# Patient Record
Sex: Female | Born: 1935
Health system: Southern US, Community
[De-identification: ages and names within clinical notes are randomized; demographics above are authoritative.]

## PROBLEM LIST (undated history)

## (undated) DIAGNOSIS — R238 Other skin changes: Secondary | ICD-10-CM

## (undated) DIAGNOSIS — I10 Essential (primary) hypertension: Secondary | ICD-10-CM

## (undated) DIAGNOSIS — F419 Anxiety disorder, unspecified: Secondary | ICD-10-CM

## (undated) DIAGNOSIS — N3281 Overactive bladder: Secondary | ICD-10-CM

## (undated) DIAGNOSIS — R233 Spontaneous ecchymoses: Secondary | ICD-10-CM

## (undated) DIAGNOSIS — M7072 Other bursitis of hip, left hip: Secondary | ICD-10-CM

## (undated) DIAGNOSIS — R42 Dizziness and giddiness: Secondary | ICD-10-CM

## (undated) DIAGNOSIS — R7303 Prediabetes: Secondary | ICD-10-CM

## (undated) DIAGNOSIS — I2510 Atherosclerotic heart disease of native coronary artery without angina pectoris: Secondary | ICD-10-CM

## (undated) DIAGNOSIS — Z85828 Personal history of other malignant neoplasm of skin: Secondary | ICD-10-CM

## (undated) DIAGNOSIS — M1990 Unspecified osteoarthritis, unspecified site: Secondary | ICD-10-CM

## (undated) DIAGNOSIS — Z87442 Personal history of urinary calculi: Secondary | ICD-10-CM

## (undated) DIAGNOSIS — IMO0001 Reserved for inherently not codable concepts without codable children: Secondary | ICD-10-CM

## (undated) DIAGNOSIS — R351 Nocturia: Secondary | ICD-10-CM

## (undated) DIAGNOSIS — E785 Hyperlipidemia, unspecified: Secondary | ICD-10-CM

## (undated) DIAGNOSIS — I422 Other hypertrophic cardiomyopathy: Secondary | ICD-10-CM

## (undated) HISTORY — DX: Hyperlipidemia, unspecified: E78.5

## (undated) HISTORY — PX: HEMORRHOID SURGERY: SHX153

## (undated) HISTORY — DX: Unspecified osteoarthritis, unspecified site: M19.90

## (undated) HISTORY — PX: ABDOMINAL HYSTERECTOMY: SHX81

## (undated) HISTORY — PX: EYE SURGERY: SHX253

---

## 2001-07-25 ENCOUNTER — Encounter: Payer: Self-pay | Admitting: Preventative Medicine

## 2001-07-25 ENCOUNTER — Ambulatory Visit (HOSPITAL_COMMUNITY): Admission: RE | Admit: 2001-07-25 | Discharge: 2001-07-25 | Payer: Self-pay | Admitting: Preventative Medicine

## 2001-12-23 ENCOUNTER — Observation Stay (HOSPITAL_COMMUNITY): Admission: RE | Admit: 2001-12-23 | Discharge: 2001-12-24 | Payer: Self-pay | Admitting: Urology

## 2002-04-27 ENCOUNTER — Ambulatory Visit (HOSPITAL_COMMUNITY): Admission: RE | Admit: 2002-04-27 | Discharge: 2002-04-27 | Payer: Self-pay | Admitting: Pulmonary Disease

## 2002-09-02 ENCOUNTER — Encounter: Admission: RE | Admit: 2002-09-02 | Discharge: 2002-09-02 | Payer: Self-pay | Admitting: Family Medicine

## 2002-11-17 ENCOUNTER — Encounter: Admission: RE | Admit: 2002-11-17 | Discharge: 2002-11-17 | Payer: Self-pay | Admitting: Family Medicine

## 2002-12-15 ENCOUNTER — Encounter: Admission: RE | Admit: 2002-12-15 | Discharge: 2002-12-15 | Payer: Self-pay | Admitting: Sports Medicine

## 2003-01-28 ENCOUNTER — Encounter: Admission: RE | Admit: 2003-01-28 | Discharge: 2003-01-28 | Payer: Self-pay | Admitting: Sports Medicine

## 2003-06-08 ENCOUNTER — Encounter: Admission: RE | Admit: 2003-06-08 | Discharge: 2003-06-08 | Payer: Self-pay | Admitting: Sports Medicine

## 2003-07-22 ENCOUNTER — Encounter: Admission: RE | Admit: 2003-07-22 | Discharge: 2003-07-22 | Payer: Self-pay | Admitting: Family Medicine

## 2003-08-05 ENCOUNTER — Encounter: Admission: RE | Admit: 2003-08-05 | Discharge: 2003-08-05 | Payer: Self-pay | Admitting: Sports Medicine

## 2003-08-31 ENCOUNTER — Encounter: Admission: RE | Admit: 2003-08-31 | Discharge: 2003-08-31 | Payer: Self-pay | Admitting: Sports Medicine

## 2003-10-12 ENCOUNTER — Observation Stay (HOSPITAL_COMMUNITY): Admission: EM | Admit: 2003-10-12 | Discharge: 2003-10-13 | Payer: Self-pay | Admitting: Emergency Medicine

## 2003-10-14 ENCOUNTER — Encounter: Admission: RE | Admit: 2003-10-14 | Discharge: 2003-10-14 | Payer: Self-pay | Admitting: Family Medicine

## 2003-11-05 ENCOUNTER — Ambulatory Visit: Payer: Self-pay | Admitting: Family Medicine

## 2003-11-19 ENCOUNTER — Ambulatory Visit: Payer: Self-pay | Admitting: Family Medicine

## 2003-12-07 ENCOUNTER — Ambulatory Visit: Payer: Self-pay | Admitting: Sports Medicine

## 2004-03-23 ENCOUNTER — Ambulatory Visit: Payer: Self-pay | Admitting: Sports Medicine

## 2004-03-23 ENCOUNTER — Ambulatory Visit (HOSPITAL_COMMUNITY): Admission: RE | Admit: 2004-03-23 | Discharge: 2004-03-23 | Payer: Self-pay | Admitting: Sports Medicine

## 2004-07-08 ENCOUNTER — Emergency Department (HOSPITAL_COMMUNITY): Admission: EM | Admit: 2004-07-08 | Discharge: 2004-07-08 | Payer: Self-pay | Admitting: Emergency Medicine

## 2004-07-10 ENCOUNTER — Ambulatory Visit: Payer: Self-pay | Admitting: Family Medicine

## 2004-07-17 ENCOUNTER — Ambulatory Visit: Payer: Self-pay | Admitting: Family Medicine

## 2004-08-14 ENCOUNTER — Ambulatory Visit: Payer: Self-pay | Admitting: Family Medicine

## 2004-09-15 ENCOUNTER — Ambulatory Visit: Payer: Self-pay | Admitting: Family Medicine

## 2005-01-16 ENCOUNTER — Ambulatory Visit: Payer: Self-pay | Admitting: Family Medicine

## 2005-01-26 ENCOUNTER — Ambulatory Visit: Payer: Self-pay | Admitting: Family Medicine

## 2005-02-27 ENCOUNTER — Ambulatory Visit: Payer: Self-pay | Admitting: Sports Medicine

## 2005-08-16 ENCOUNTER — Encounter (INDEPENDENT_AMBULATORY_CARE_PROVIDER_SITE_OTHER): Payer: Self-pay | Admitting: Internal Medicine

## 2005-08-17 ENCOUNTER — Ambulatory Visit: Payer: Self-pay | Admitting: Internal Medicine

## 2005-08-20 LAB — CONVERTED CEMR LAB
ALT: 20 units/L
AST: 20 units/L
Albumin: 4.4 g/dL
Alkaline Phosphatase: 61 units/L
BUN: 17 mg/dL
CO2: 30 meq/L
Calcium: 9.2 mg/dL
Creatinine, Ser: 1 mg/dL
Glucose, Bld: 94 mg/dL
LDL Cholesterol: 207 mg/dL
Potassium: 4.3 meq/L
Total CHOL/HDL Ratio: 6.5
Total Protein: 7.6 g/dL
Triglycerides: 222 mg/dL

## 2005-08-27 ENCOUNTER — Ambulatory Visit (HOSPITAL_COMMUNITY): Admission: RE | Admit: 2005-08-27 | Discharge: 2005-08-27 | Payer: Self-pay | Admitting: Internal Medicine

## 2005-08-30 ENCOUNTER — Encounter (INDEPENDENT_AMBULATORY_CARE_PROVIDER_SITE_OTHER): Payer: Self-pay | Admitting: Internal Medicine

## 2005-10-03 ENCOUNTER — Ambulatory Visit (HOSPITAL_COMMUNITY): Admission: RE | Admit: 2005-10-03 | Discharge: 2005-10-03 | Payer: Self-pay | Admitting: Internal Medicine

## 2005-11-12 ENCOUNTER — Encounter (INDEPENDENT_AMBULATORY_CARE_PROVIDER_SITE_OTHER): Payer: Self-pay | Admitting: Internal Medicine

## 2005-11-12 LAB — CONVERTED CEMR LAB
Hemoglobin, Urine: NEGATIVE
Specific Gravity, Urine: 1.025
Urine Glucose: NEGATIVE mg/dL
pH: 5

## 2005-11-20 ENCOUNTER — Ambulatory Visit (HOSPITAL_COMMUNITY): Admission: RE | Admit: 2005-11-20 | Discharge: 2005-11-20 | Payer: Self-pay | Admitting: Internal Medicine

## 2005-11-20 ENCOUNTER — Encounter (INDEPENDENT_AMBULATORY_CARE_PROVIDER_SITE_OTHER): Payer: Self-pay | Admitting: Internal Medicine

## 2005-11-20 ENCOUNTER — Ambulatory Visit: Payer: Self-pay | Admitting: Cardiology

## 2005-12-25 ENCOUNTER — Ambulatory Visit: Payer: Self-pay | Admitting: Family Medicine

## 2005-12-28 ENCOUNTER — Ambulatory Visit: Payer: Self-pay | Admitting: Internal Medicine

## 2006-02-22 ENCOUNTER — Ambulatory Visit: Payer: Self-pay | Admitting: Internal Medicine

## 2006-02-22 ENCOUNTER — Ambulatory Visit (HOSPITAL_COMMUNITY): Admission: RE | Admit: 2006-02-22 | Discharge: 2006-02-22 | Payer: Self-pay | Admitting: Internal Medicine

## 2006-02-25 ENCOUNTER — Encounter (INDEPENDENT_AMBULATORY_CARE_PROVIDER_SITE_OTHER): Payer: Self-pay | Admitting: Internal Medicine

## 2006-04-16 ENCOUNTER — Ambulatory Visit: Payer: Self-pay | Admitting: Internal Medicine

## 2006-04-16 DIAGNOSIS — J309 Allergic rhinitis, unspecified: Secondary | ICD-10-CM | POA: Insufficient documentation

## 2006-04-16 DIAGNOSIS — H3530 Unspecified macular degeneration: Secondary | ICD-10-CM | POA: Insufficient documentation

## 2006-04-16 DIAGNOSIS — R32 Unspecified urinary incontinence: Secondary | ICD-10-CM | POA: Insufficient documentation

## 2006-04-16 DIAGNOSIS — Z85828 Personal history of other malignant neoplasm of skin: Secondary | ICD-10-CM | POA: Insufficient documentation

## 2006-04-16 DIAGNOSIS — D234 Other benign neoplasm of skin of scalp and neck: Secondary | ICD-10-CM | POA: Insufficient documentation

## 2006-04-16 DIAGNOSIS — D259 Leiomyoma of uterus, unspecified: Secondary | ICD-10-CM | POA: Insufficient documentation

## 2006-04-16 DIAGNOSIS — I10 Essential (primary) hypertension: Secondary | ICD-10-CM | POA: Insufficient documentation

## 2006-04-16 DIAGNOSIS — Z87442 Personal history of urinary calculi: Secondary | ICD-10-CM | POA: Insufficient documentation

## 2006-04-16 DIAGNOSIS — E782 Mixed hyperlipidemia: Secondary | ICD-10-CM | POA: Insufficient documentation

## 2006-04-16 DIAGNOSIS — N318 Other neuromuscular dysfunction of bladder: Secondary | ICD-10-CM | POA: Insufficient documentation

## 2006-04-16 DIAGNOSIS — Z8669 Personal history of other diseases of the nervous system and sense organs: Secondary | ICD-10-CM | POA: Insufficient documentation

## 2006-09-24 ENCOUNTER — Ambulatory Visit (HOSPITAL_COMMUNITY): Admission: RE | Admit: 2006-09-24 | Discharge: 2006-09-24 | Payer: Self-pay | Admitting: Internal Medicine

## 2006-09-24 ENCOUNTER — Ambulatory Visit: Payer: Self-pay | Admitting: Internal Medicine

## 2006-09-24 ENCOUNTER — Telehealth (INDEPENDENT_AMBULATORY_CARE_PROVIDER_SITE_OTHER): Payer: Self-pay | Admitting: *Deleted

## 2006-09-24 DIAGNOSIS — M542 Cervicalgia: Secondary | ICD-10-CM | POA: Insufficient documentation

## 2006-09-24 DIAGNOSIS — M549 Dorsalgia, unspecified: Secondary | ICD-10-CM | POA: Insufficient documentation

## 2006-09-24 DIAGNOSIS — L570 Actinic keratosis: Secondary | ICD-10-CM | POA: Insufficient documentation

## 2006-09-24 DIAGNOSIS — I868 Varicose veins of other specified sites: Secondary | ICD-10-CM | POA: Insufficient documentation

## 2006-09-25 ENCOUNTER — Telehealth (INDEPENDENT_AMBULATORY_CARE_PROVIDER_SITE_OTHER): Payer: Self-pay | Admitting: *Deleted

## 2006-09-26 ENCOUNTER — Encounter: Payer: Self-pay | Admitting: Internal Medicine

## 2006-09-26 ENCOUNTER — Ambulatory Visit: Payer: Self-pay | Admitting: Internal Medicine

## 2006-09-30 ENCOUNTER — Encounter (INDEPENDENT_AMBULATORY_CARE_PROVIDER_SITE_OTHER): Payer: Self-pay | Admitting: Internal Medicine

## 2006-10-01 ENCOUNTER — Ambulatory Visit (HOSPITAL_COMMUNITY): Admission: RE | Admit: 2006-10-01 | Discharge: 2006-10-01 | Payer: Self-pay | Admitting: Internal Medicine

## 2006-10-02 ENCOUNTER — Ambulatory Visit: Payer: Self-pay | Admitting: Internal Medicine

## 2006-10-02 DIAGNOSIS — D049 Carcinoma in situ of skin, unspecified: Secondary | ICD-10-CM | POA: Insufficient documentation

## 2006-10-03 ENCOUNTER — Encounter (INDEPENDENT_AMBULATORY_CARE_PROVIDER_SITE_OTHER): Payer: Self-pay | Admitting: Internal Medicine

## 2006-10-11 ENCOUNTER — Ambulatory Visit: Payer: Self-pay | Admitting: Internal Medicine

## 2006-10-18 ENCOUNTER — Telehealth (INDEPENDENT_AMBULATORY_CARE_PROVIDER_SITE_OTHER): Payer: Self-pay | Admitting: *Deleted

## 2006-12-26 ENCOUNTER — Ambulatory Visit: Payer: Self-pay | Admitting: Internal Medicine

## 2007-02-07 ENCOUNTER — Ambulatory Visit: Payer: Self-pay | Admitting: Internal Medicine

## 2007-03-17 ENCOUNTER — Ambulatory Visit: Payer: Self-pay | Admitting: Internal Medicine

## 2007-09-11 ENCOUNTER — Encounter (INDEPENDENT_AMBULATORY_CARE_PROVIDER_SITE_OTHER): Payer: Self-pay | Admitting: Internal Medicine

## 2007-11-06 ENCOUNTER — Ambulatory Visit (HOSPITAL_COMMUNITY): Admission: RE | Admit: 2007-11-06 | Discharge: 2007-11-06 | Payer: Self-pay | Admitting: Internal Medicine

## 2007-11-17 ENCOUNTER — Telehealth (INDEPENDENT_AMBULATORY_CARE_PROVIDER_SITE_OTHER): Payer: Self-pay | Admitting: *Deleted

## 2008-01-19 ENCOUNTER — Ambulatory Visit: Payer: Self-pay | Admitting: Internal Medicine

## 2008-01-19 DIAGNOSIS — M25559 Pain in unspecified hip: Secondary | ICD-10-CM | POA: Insufficient documentation

## 2008-05-25 ENCOUNTER — Telehealth (INDEPENDENT_AMBULATORY_CARE_PROVIDER_SITE_OTHER): Payer: Self-pay | Admitting: Internal Medicine

## 2008-09-01 ENCOUNTER — Ambulatory Visit: Payer: Self-pay | Admitting: Internal Medicine

## 2008-09-01 DIAGNOSIS — M810 Age-related osteoporosis without current pathological fracture: Secondary | ICD-10-CM | POA: Insufficient documentation

## 2008-09-02 ENCOUNTER — Encounter (INDEPENDENT_AMBULATORY_CARE_PROVIDER_SITE_OTHER): Payer: Self-pay | Admitting: Internal Medicine

## 2008-09-03 LAB — CONVERTED CEMR LAB
ALT: 16 units/L (ref 0–35)
AST: 17 units/L (ref 0–37)
Alkaline Phosphatase: 61 units/L (ref 39–117)
CO2: 22 meq/L (ref 19–32)
Cholesterol: 267 mg/dL — ABNORMAL HIGH (ref 0–200)
Glucose, Bld: 104 mg/dL — ABNORMAL HIGH (ref 70–99)
Sodium: 139 meq/L (ref 135–145)
Total CHOL/HDL Ratio: 5.2
Total Protein: 7.8 g/dL (ref 6.0–8.3)
Triglycerides: 193 mg/dL — ABNORMAL HIGH (ref <150)
VLDL: 39 mg/dL (ref 0–40)

## 2008-09-06 ENCOUNTER — Encounter (INDEPENDENT_AMBULATORY_CARE_PROVIDER_SITE_OTHER): Payer: Self-pay | Admitting: Internal Medicine

## 2008-09-06 ENCOUNTER — Ambulatory Visit (HOSPITAL_COMMUNITY): Admission: RE | Admit: 2008-09-06 | Discharge: 2008-09-06 | Payer: Self-pay | Admitting: Internal Medicine

## 2008-09-13 ENCOUNTER — Encounter (INDEPENDENT_AMBULATORY_CARE_PROVIDER_SITE_OTHER): Payer: Self-pay | Admitting: Internal Medicine

## 2008-09-17 ENCOUNTER — Ambulatory Visit: Payer: Self-pay | Admitting: Internal Medicine

## 2008-10-18 ENCOUNTER — Ambulatory Visit: Payer: Self-pay | Admitting: Internal Medicine

## 2008-10-18 DIAGNOSIS — L708 Other acne: Secondary | ICD-10-CM | POA: Insufficient documentation

## 2008-10-18 HISTORY — DX: Other acne: L70.8

## 2008-10-28 ENCOUNTER — Encounter (INDEPENDENT_AMBULATORY_CARE_PROVIDER_SITE_OTHER): Payer: Self-pay | Admitting: Internal Medicine

## 2008-11-26 LAB — HM MAMMOGRAPHY

## 2008-11-30 ENCOUNTER — Ambulatory Visit (HOSPITAL_COMMUNITY): Admission: RE | Admit: 2008-11-30 | Discharge: 2008-11-30 | Payer: Self-pay | Admitting: Family Medicine

## 2009-09-27 ENCOUNTER — Emergency Department (HOSPITAL_COMMUNITY): Admission: EM | Admit: 2009-09-27 | Discharge: 2009-09-27 | Payer: Self-pay | Admitting: Emergency Medicine

## 2009-10-24 ENCOUNTER — Encounter: Admission: RE | Admit: 2009-10-24 | Discharge: 2009-10-24 | Payer: Self-pay | Admitting: Diagnostic Neuroimaging

## 2009-11-17 ENCOUNTER — Ambulatory Visit (HOSPITAL_COMMUNITY): Admission: RE | Admit: 2009-11-17 | Discharge: 2009-11-17 | Payer: Self-pay | Admitting: Ophthalmology

## 2009-12-01 ENCOUNTER — Ambulatory Visit (HOSPITAL_COMMUNITY): Admission: RE | Admit: 2009-12-01 | Discharge: 2009-12-01 | Payer: Self-pay | Admitting: Ophthalmology

## 2009-12-13 ENCOUNTER — Ambulatory Visit (HOSPITAL_COMMUNITY): Admission: RE | Admit: 2009-12-13 | Discharge: 2009-12-13 | Payer: Self-pay | Admitting: Internal Medicine

## 2010-02-09 ENCOUNTER — Emergency Department (HOSPITAL_COMMUNITY)
Admission: EM | Admit: 2010-02-09 | Discharge: 2010-02-09 | Payer: Self-pay | Source: Home / Self Care | Admitting: Emergency Medicine

## 2010-02-09 ENCOUNTER — Observation Stay (HOSPITAL_COMMUNITY)
Admission: EM | Admit: 2010-02-09 | Discharge: 2010-02-10 | Payer: Self-pay | Source: Home / Self Care | Attending: Cardiovascular Disease | Admitting: Cardiovascular Disease

## 2010-05-08 LAB — DIFFERENTIAL
Basophils Absolute: 0 10*3/uL (ref 0.0–0.1)
Basophils Relative: 0 % (ref 0–1)
Eosinophils Absolute: 0.2 10*3/uL (ref 0.0–0.7)
Eosinophils Relative: 2 % (ref 0–5)
Lymphocytes Relative: 38 % (ref 12–46)
Lymphocytes Relative: 42 % (ref 12–46)
Lymphs Abs: 3 10*3/uL (ref 0.7–4.0)
Monocytes Absolute: 0.5 10*3/uL (ref 0.1–1.0)
Monocytes Relative: 6 % (ref 3–12)
Neutro Abs: 3.7 10*3/uL (ref 1.7–7.7)
Neutro Abs: 4.1 10*3/uL (ref 1.7–7.7)
Neutrophils Relative %: 50 % (ref 43–77)

## 2010-05-08 LAB — COMPREHENSIVE METABOLIC PANEL
ALT: 15 U/L (ref 0–35)
Albumin: 3.2 g/dL — ABNORMAL LOW (ref 3.5–5.2)
Alkaline Phosphatase: 49 U/L (ref 39–117)
CO2: 28 mEq/L (ref 19–32)
Chloride: 105 mEq/L (ref 96–112)
Creatinine, Ser: 0.89 mg/dL (ref 0.4–1.2)
Glucose, Bld: 101 mg/dL — ABNORMAL HIGH (ref 70–99)
Potassium: 4 mEq/L (ref 3.5–5.1)
Total Bilirubin: 0.6 mg/dL (ref 0.3–1.2)

## 2010-05-08 LAB — CBC
Hemoglobin: 14.2 g/dL (ref 12.0–15.0)
MCH: 28.7 pg (ref 26.0–34.0)
MCHC: 32.8 g/dL (ref 30.0–36.0)
Platelets: 215 10*3/uL (ref 150–400)
RBC: 4.92 MIL/uL (ref 3.87–5.11)
WBC: 7.2 10*3/uL (ref 4.0–10.5)

## 2010-05-08 LAB — BASIC METABOLIC PANEL
Calcium: 9.4 mg/dL (ref 8.4–10.5)
GFR calc Af Amer: >60 mL/min (ref >60)
GFR calc non Af Amer: >60 mL/min (ref >60)
Sodium: 138 mEq/L (ref 135–145)

## 2010-05-08 LAB — LIPID PANEL
Cholesterol: 249 mg/dL — ABNORMAL HIGH (ref 0–200)
HDL: 36 mg/dL — ABNORMAL LOW (ref >39)
Total CHOL/HDL Ratio: 6.9 RATIO
Triglycerides: 196 mg/dL — ABNORMAL HIGH (ref <150)

## 2010-05-08 LAB — POCT CARDIAC MARKERS
CKMB, poc: 1.8 ng/mL (ref 1.0–8.0)
Myoglobin, poc: 71.3 ng/mL (ref 12–200)

## 2010-05-08 LAB — HEMOGLOBIN A1C: Hgb A1c MFr Bld: 6.3 % — ABNORMAL HIGH (ref <5.7)

## 2010-05-08 LAB — MRSA PCR SCREENING: MRSA by PCR: NEGATIVE

## 2010-05-11 LAB — BASIC METABOLIC PANEL
CO2: 32 mEq/L (ref 19–32)
GFR calc non Af Amer: >60 mL/min (ref >60)
Glucose, Bld: 110 mg/dL — ABNORMAL HIGH (ref 70–99)
Potassium: 4.3 mEq/L (ref 3.5–5.1)
Sodium: 138 mEq/L (ref 135–145)

## 2010-05-12 LAB — CBC
Platelets: 233 10*3/uL (ref 150–400)
RBC: 4.63 MIL/uL (ref 3.87–5.11)
WBC: 7.3 10*3/uL (ref 4.0–10.5)

## 2010-05-12 LAB — URINALYSIS, ROUTINE W REFLEX MICROSCOPIC
Glucose, UA: NEGATIVE mg/dL
Protein, ur: NEGATIVE mg/dL

## 2010-05-12 LAB — BASIC METABOLIC PANEL
Calcium: 9.7 mg/dL (ref 8.4–10.5)
Chloride: 100 mEq/L (ref 96–112)
Creatinine, Ser: 0.99 mg/dL (ref 0.4–1.2)
GFR calc Af Amer: >60 mL/min (ref >60)
GFR calc non Af Amer: 55 mL/min — ABNORMAL LOW (ref >60)

## 2010-05-12 LAB — DIFFERENTIAL
Lymphocytes Relative: 22 % (ref 12–46)
Lymphs Abs: 1.6 10*3/uL (ref 0.7–4.0)
Neutro Abs: 5.3 10*3/uL (ref 1.7–7.7)
Neutrophils Relative %: 72 % (ref 43–77)

## 2010-05-12 LAB — POCT CARDIAC MARKERS
Myoglobin, poc: 82.5 ng/mL (ref 12–200)
Troponin i, poc: <0.05 ng/mL (ref 0.00–0.09)

## 2010-07-14 NOTE — Procedures (Signed)
NAMEYVONDA, FOUTY                ACCOUNT NO.:  [id number]   MEDICAL RECORD NO.:  [id number]          PATIENT TYPE:  OUT   LOCATION:  RAD                           FACILITY:  [facility]   PHYSICIAN:  Gerrit Friends. Dietrich Pates, MD, FACCDATE OF BIRTH:  01/26/1936   DATE OF PROCEDURE:  11/20/2005  DATE OF DISCHARGE:                                  ECHOCARDIOGRAM   CLINICAL DATA:  A 75 year old woman with hypertension and an abnormal EKG.   Aorta 3.4, left atrium 3.5, septum 1.4, posterior wall 1.1, LV diastole 4.1,  LV systole 2.6.   1. Technically adequate echocardiographic study.  2. Mild left atrial enlargement; normal right atrium.  3. Normal right ventricular size and function; moderate hypertrophy.  4. Moderate sclerosis of the aortic valve; normal internal dimension of      the aortic root; mild calcification of the wall.  5. Normal mitral valve; mild annular calcification; systolic anterior      motion of the chordae.  6. Normal tricuspid valve.  7. Pulmonic artery and pulmonic valve not ideally imaged, but appear      grossly normal.  8. Normal left ventricular size; mild hypertrophy; normal regional and      global function.  9. Normal IVC.      Gerrit Friends. Dietrich Pates, MD, [facility]  Electronically Signed     RMR/MEDQ  D:  11/20/2005  T:  11/22/2005  Job:  [id number]

## 2010-07-14 NOTE — Discharge Summary (Signed)
NAME:  Lauren House, Lauren House                          ACCOUNT NO.:  [id number]   MEDICAL RECORD NO.:  [id number]                   PATIENT TYPE:  OBV   LOCATION:  6526                                 FACILITY:  [facility]   PHYSICIAN:  Henri Medal, MD             DATE OF BIRTH:  [birth year]   DATE OF ADMISSION:  10/12/2003  DATE OF DISCHARGE:  10/13/2003                                 DISCHARGE SUMMARY   DIAGNOSES:  1. Hypertensive urgency.  2. Hypercholesterolemia.   HISTORY OF PRESENT ILLNESS:  This 75 year old white female came to the ER  complaining of nausea, dizziness, right shoulder tingling sensation,  headaches.  Blood pressure at the ER was read at 200/103.  The patient was  in no acute distress.  Due to the patient's risk factors such as  hyperlipidemia, cardiac catheterization, 25% mid-LAD lesion, and family  history for positive for CAD, the patient was admitted for cardiac  monitoring, treatment of blood pressure, and rule out MI and TIA.   Cardiac enzymes POC x 3 were normal.  EKG displaced changes consistent with  previous EKG such as ST T-wave changes on V1, V2, and aVF.  The patient was  treated for hypertension and received ASS.   PHYSICAL EXAMINATION:  VITAL SIGNS:  On October 13, 2003, blood pressure was  125/70.  Heart rate 70, respiratory rate 20.  O2 saturation 93% on room air.  GENERAL:  She was in no acute distress.  All other physical examination was  unremarkable.   LABORATORY DATA:  Normal results on cardiac enzymes.  Fasting lipid profile  was pending.  The EKG, head CT, and chest x-ray were suggestive for  longstanding hypertension.   PLAN:  Continue HCTZ.  Add 81 mg of ASA, impossible to increase dosage due  to nose bleeds.  Since blood pressure, cardiac enzymes, and EKG this a.m.  are normal, the patient is discharged home.  Refer to cardiology as an  outpatient for further follow-up  _________.  Regarding the hyperlipidemia,  increase LDL.  The  patient is intolerant statins.  Strongly consider Niacin  plus bile acid sequestrant (reported LDL 200).  As the patient goal is to be  at less than 130.  The patient has a follow-up appointment tomorrow with  primary care physician, Dr. Penni Bombard.   Laboratory data at discharge, troponin less than 0.01, CK-MB as above x 2.  Lipid profile pending.   EKG showed T-wave inversion, septal, inferior, and laterally.  CT of the  head showed small vessel disease.  Chest x-ray with borderline cardiomegaly.   PROCEDURE:  1. EKG.  2. CT of the head.  3. Chest x-ray.   DISCHARGE MEDICATIONS:  1. HCTZ 25 mg 1 tablet q.d.  2. Enalapril 500 mg 1 tablet q.d.   FOLLOW UP:  Call on Dr. Penni Bombard at [facility] October 14, 2003 at  1:30 p.m.  Cardiolite (cardiac  test) at [facility] at [address] Third floor on October 20, 2003 at 9:45 a.m.                                                Henri Medal, MD    FIM/MEDQ  D:  10/23/2003  T:  10/25/2003  Job:  [id number]

## 2010-07-14 NOTE — H&P (Signed)
NAME:  Lauren House, Lauren House                          ACCOUNT NO.:  [id number]   MEDICAL RECORD NO.:  [id number]                   PATIENT TYPE:  OBV   LOCATION:  6526                                 FACILITY:  [facility]   PHYSICIAN:  Rebecca L. Jolinda Croak, M.D.          DATE OF BIRTH:  02-12-1936   DATE OF ADMISSION:  10/12/2003  DATE OF DISCHARGE:  10/13/2003                                HISTORY & PHYSICAL   HISTORY OF PRESENT ILLNESS:  A 75 year old white female came to the ER with  a history of nausea, dizziness, right shoulder tingling, and headache.  The  patient states that this a.m. while doing the groceries, she felt nauseated.  It got so intense that she decided to go back home.  At home dizziness and  nausea.  During the p.m. also headache 5 out of 10 in intensity and shoulder  tingling sensation.  During exam, the patient is comfortable in no acute  distress.  She denies chest pain, no SOB, no sweating, no weakness, no  visual changes.  Cardiac cath, five years ago.  The patient states she was  told to have a 25% blockage, but the patient is not sure where is the  blockage or can explain.  Ten years ago, the patient had an episode of  dizziness, fall to the floor, but did not call EMS.  This episode ten years  ago resolved spontaneously.   REVIEW OF SYSTEMS:  The remainder of ROS is noncontributory.   PAST MEDICAL HISTORY:  1. Macular degeneration.  2. Kidney stones with nephrolithiasis.  3. Hyperlipidemia.  4. History of diverticulosis.   PROCEDURES:  1. Pilonidal cystectomy/hemorrhoidectomy three years ago.  2. History of hysterectomy, three years ago.  3. Bladder and separated tack on October 2003 with [facility].   MEDICATIONS:  Calcium and vitamin D combination 600 mg every day.   ALLERGIES:  1. SEPTRA.  2. PENICILLIN.  3. BIAXIN.  4. CIPROFLOXACIN.  5. CEFTIN.  6. LIPITOR (causes myalgias).  7. ZOCOR (causes myalgias).  8. ZETIA.   PROCEDURE:  1. MRI knee/LUCIO on  July 2004.  2. Cardiac cath, 25% MIV LAV lesion, EF 65% on February 2004.   FAMILY HISTORY:  Mother alive at 58 years old for CAD, history of hip  fracture.  Father died at 18 with CAD.  Two brothers, both died age 29 and  23 with lung cancer.  Sister alive at 75, no health problems.   SOCIAL HISTORY:  Retired.  Husband is Cyndra Numbers, a retired Optician, dispensing.  Lives in  [place].  Originally from [place], [place].  Likes to do yard work,  swim, [employer].  Does not smoke, use ETOH, or other drugs.   PHYSICAL EXAMINATION:  VITAL SIGNS:  T 97.9, HR 75, BP 194/103, pulse ox O2  97%, respiratory rate 14.  GENERAL:  No acute distress.  Oriented x 3.  PSYCHIATRIC:  The patient's judgment and insight normal.  Brief  assessment  of mental status normal.  HEENT:  Eyes, conjunctivae and lids normal.  PERRLA.  External ears and  nose, normal.  Teeth and gums normal.  Oropharynx normal.  NECK:  No masses, no tenderness, or thyroid enlargement.  No lymphatic.  RESPIRATORY:  Clear to auscultation bilaterally.  Good respiratory effort.  No wheezing, no rhonchi, no crackles.  CV:  Normal S1, S2.  RRR.  No murmurs.  ABDOMEN:  Left upper quadrant tenderness, intensity 2, on deep palpation.  Positive bowel sounds.  Nondistended.  EXTREMITIES:  Inspection palpation of __________ normal.  No edema.  SKIN:  Normal inspection.  NEUROLOGIC:  Cranial nerves II-XII intact.   LABORATORY DATA:  WBC 8.9, HB 10.5, HCT 42.2, platelets 270, MCV 85.1,  differential N 70%.  BMP, NA 138, K 4.1, CL 104, CO2 27, BUN 16, CR 0.8,  glucose 122.  PT 12.1, INR 0.9, PTT 27.  Cardiac point-of-care MYO 79, 72.6.  CK-MB 1.3, 1.2.  Troponin I less than 0.05, less than 0.05.  Albumin 4.1.  Total protein 7.6.  CA 9.1.   Chest x-ray:  Borderline CM, NAV, bilateral nipple shadows.   Head CT:  No acute abnormalities.   EKG:  Display changes in ST waves on V1, V2, and aVF compared with an old  EKG.   ASSESSMENT:  A 75 year old white  female with hypertensive urgency, nausea,  dizziness, headache, and right shoulder tingling sensation.   PLAN:  1. Hypertension urgency.  BP at ER more than 200/103.  Now BP has been     decreasing without meds up to 170/95.  The patient stated the primary     care physician has been checking her blood pressure since it has been     higher than normal.  HCTZ 25 mg p.o. every day.  Will follow.  2. Hypercholesterolemia.  PCP has tried to treat with Zocor, Lipitor, but     failed for the patient intolerance to medication.  Fasting lipid profile     for a.m.  ASA 81 mg every day.  3. EKG changes.  Previous EKG show ST waves inversion in leads V1, V2, and     aVF.  Actual EKG displayed changes in ST waves on the same V1, V2, and     aVF compare with the past EKG.  Cardiac enzymes point-of-care normal x 3.     Would request cardiac enzymes, troponin I q.8h. x 3.  Repeat EKG in the     a.m.  Chest x-ray shows mild cardiomegaly.  Stress test as an outpatient.  4. Headache, most likely secondary to hypertension.  Head CT normal.  5. Tingling on right shoulder.  Neurologic exam unremarkable.  6. Dizziness, most likely related to hypertension.  7. Nausea, probably related with hypertension or MI, will follow.   DISPOSITION:  Admit for blood pressure treatment and cardiac monitoring to  rule out MI.  HCTZ for hypertension.      Henri Medal, MD                   Randon Goldsmith. Jolinda Croak, M.D.    Lendon Colonel  D:  10/23/2003  T:  10/24/2003  Job:  [id number]

## 2010-07-14 NOTE — Op Note (Signed)
NAME:  Lauren House, Lauren House                          ACCOUNT NO.:  [id number]   MEDICAL RECORD NO.:  [id number]                   PATIENT TYPE:  AMB   LOCATION:  DAY                                  FACILITY:  [facility]   PHYSICIAN:  Jamison Neighbor, M.D.               DATE OF BIRTH:  09-10-1935   DATE OF PROCEDURE:  12/23/2001  DATE OF DISCHARGE:                                 OPERATIVE REPORT   PREOPERATIVE DIAGNOSES:  1. Cystocele.  2. Stress urinary incontinence.   POSTOPERATIVE DIAGNOSES:  1. Cystocele.  2. Stress urinary incontinence.   PROCEDURE:  1. Cystoscopy.  2. Anterior repair.  3. Pubovaginal sling.  4. Suprapubic tube.   SURGEON:  Jamison Neighbor, M.D.   ANESTHESIA:  General.   COMPLICATIONS:  None.   DRAINS:  A Bonanno Cystocath.   BRIEF HISTORY:  This 75 year old female has had problems with bladder  prolapse along with stress urinary incontinence.  The patient does have some  degree of obstructive voiding secondary to the cystocele and has a postvoid  residual of 125 cc.  __________, the patient has external rotation and loss  of urine, and it is felt that she does have a combination of obstruction as  well as stress incontinence.  The patient is undergo anterior repair plus  sling.  She understands the risks and benefits of the procedure and gave  full and informed consent.   DESCRIPTION OF PROCEDURE:  After the successful induction of general  anesthesia, the patient was placed in the dorsal lithotomy position, prepped  with Betadine, and draped in the usual sterile fashion.  Examination showed  that the patient does have cystocele with a prominent central defect.  The  patient has a very modest rectocele which is asymptomatic.  There is no  vault prolapse or enterocele.  The anterior vaginal mucosa is infiltrated  with local anesthetic.  While that was setting up, a small incision was made  directly above the pubic bone, and this was well below the  patient's  previous abdominoplasty incision.  That incision was carried down to the  pubic bone.  Hemostasis was obtained with electrocautery.  The anterior  vaginal mucosa was then incised, beginning at the mid urethra, extending all  the way back to the cardinal ligaments.  Flaps of mucosa were raised  bilaterally, and entry into the space of Retzius was obtained.  The sling  was then constructed from a 4 x 7 cm piece of Tutoplast treated fascia which  shaped into a T shape.  The long ends of the T were oversewn with #1 nylon.  The sling was pulled from the abdominal incision to the vaginal incision  with tonsil clamps.  The sling was tacked down with Vicryl from the mid  urethra all the way down to the cardinal ligaments, covering over the defect  with fascia.  The area was irrigated,  mucosa was trimmed and then closed  with a running suture of 2-0 Vicryl.  Cystoscopy was performed, and the  bladder was carefully inspected.  It was free of any tumor or stones.  The  midline did show some heaping up from the repair.  The ureteral orifices  were otherwise unremarkable and were not obstructed.  There was no evidence  of any entry of the suture material or sling material into the bladder.  The  sling was then tied down with an appropriate degree of tension.  Several  steps were taken to ensure no overcorrection.  When the bladder was full,  there was no loss of urine with a Crede maneuver to mimic voiding, and there  was a prompt, straight flow of urine.  The sling was tied down so that two  fingers could still be placed underneath the tie superiorly.  The cystoscope  could be inserted, and there was 30-45 degrees of play.  There was  coaptation but no angulation of the urethra with the cystoscope for  visualization.  After the slings had been tied down and trimmed, the area  was irrigated and then closed with Vicryl and skin stitches.  The Laurian Brim was placed under direct vision  and was then sutured down with  nylon sutures.  The patient tolerated the procedure well and was taken to  the recovery room in good condition.                                               Jamison Neighbor, M.D.    RJE/MEDQ  D:  12/23/2001  T:  12/23/2001  Job:  [id number]   cc:   Tilda Burrow, M.D.  [address] [place]  [place] [address]  Fax: [phone]

## 2010-09-05 ENCOUNTER — Encounter: Payer: Self-pay | Admitting: Family Medicine

## 2011-08-23 ENCOUNTER — Other Ambulatory Visit: Payer: Self-pay | Admitting: Family Medicine

## 2011-08-23 DIAGNOSIS — R1031 Right lower quadrant pain: Secondary | ICD-10-CM

## 2011-08-24 ENCOUNTER — Ambulatory Visit
Admission: RE | Admit: 2011-08-24 | Discharge: 2011-08-24 | Disposition: A | Discharge status: Home / Self Care | Payer: Medicare Other | Source: Ambulatory Visit | Attending: Family Medicine | Admitting: Family Medicine

## 2011-08-24 DIAGNOSIS — R1031 Right lower quadrant pain: Secondary | ICD-10-CM

## 2011-08-24 MED ORDER — IOHEXOL 300 MG/ML  SOLN
100.0000 mL | Freq: Once | INTRAMUSCULAR | Status: AC | PRN
  Administered 2011-08-24: 100 mL via INTRAVENOUS

## 2011-09-04 ENCOUNTER — Ambulatory Visit (HOSPITAL_COMMUNITY)
Admission: RE | Admit: 2011-09-04 | Discharge: 2011-09-04 | Disposition: A | Discharge status: Home / Self Care | Payer: Medicare Other | Source: Ambulatory Visit | Attending: Family Medicine | Admitting: Family Medicine

## 2011-09-04 ENCOUNTER — Other Ambulatory Visit: Payer: Self-pay | Admitting: Family Medicine

## 2011-09-04 DIAGNOSIS — M8530 Osteitis condensans, unspecified site: Secondary | ICD-10-CM | POA: Insufficient documentation

## 2011-09-04 DIAGNOSIS — M25551 Pain in right hip: Secondary | ICD-10-CM

## 2011-09-04 DIAGNOSIS — M25559 Pain in unspecified hip: Secondary | ICD-10-CM | POA: Insufficient documentation

## 2011-09-11 ENCOUNTER — Encounter (INDEPENDENT_AMBULATORY_CARE_PROVIDER_SITE_OTHER): Payer: Self-pay

## 2011-09-17 ENCOUNTER — Ambulatory Visit (INDEPENDENT_AMBULATORY_CARE_PROVIDER_SITE_OTHER): Payer: Medicare Other | Admitting: General Surgery

## 2011-09-17 ENCOUNTER — Encounter (INDEPENDENT_AMBULATORY_CARE_PROVIDER_SITE_OTHER): Payer: Self-pay | Admitting: General Surgery

## 2011-09-17 VITALS — BP 142/83 | HR 74 | Temp 99.0°F | Ht 67.0 in | Wt 149.0 lb

## 2011-09-17 DIAGNOSIS — K4020 Bilateral inguinal hernia, without obstruction or gangrene, not specified as recurrent: Secondary | ICD-10-CM

## 2011-09-17 NOTE — Patient Instructions (Signed)
 Hernia repair risk:   Risks include but are no limited to bleeding, infection, chronic pain, nerve entrapment, hernia recurrence, mesh complications, hematoma formation, urinary retention, blood clots, injury to the surrounding structures, and anesthesia risk.   Most patients require pain medication for around 1-2 weeks.  You should not do any heavy lifting for for 6 weeks.  Groin hernia repairs also carry risk of injury to the testicles or the ovaries and numbness in the groin.   Hernia, Surgical Repair A hernia occurs when an internal organ pushes out through a weak spot in the belly (abdominal) wall muscles. Hernias commonly occur in the groin and around the navel. Hernias often can be pushed back into place (reduced). Most hernias tend to get worse over time. Problems occur when abdominal contents get stuck in the opening (incarcerated hernia). The blood supply gets cut off (strangulated hernia). This is an emergency and needs surgery. Otherwise, hernia repair can be an elective procedure. This means you can schedule this at your convenience when an emergency is not present. Because complications can occur, if you decide to repair the hernia, it is best to do it soon. When it becomes an emergency procedure, there is increased risk of complications after surgery. CAUSES   Heavy lifting.   Obesity.   Prolonged coughing.   Straining to move your bowels.   Hernias can also occur through a cut (incision) by a surgeonafter an abdominal operation.  HOME CARE INSTRUCTIONS Before the repair:  Bed rest is not required. You may continue your normal activities, but avoid heavy lifting (more than 10 pounds) or straining. Cough gently. If you are a smoker, it is best to stop. Even the best hernia repair can break down with the continual strain of coughing.   Do not wear anything tight over your hernia. Do not try to keep it in with an outside bandage or truss. These can damage abdominal contents  if they are trapped in the hernia sac.   Eat a normal diet. Avoid constipation. Straining over long periods of time to have a bowel movement will increase hernia size. It also can breakdown repairs. If you cannot do this with diet alone, laxatives or stool softeners may be used.  PRIOR TO SURGERY, SEEK IMMEDIATE MEDICAL CARE IF: You have problems (symptoms) of a trapped (incarcerated) hernia. Symptoms include:  An oral temperature above 102 F (38.9 C) develops, or as your caregiver suggests.   Increasing abdominal pain.   Feeling sick to your stomach(nausea) and vomiting.   You stop passing gas or stool.   The hernia is stuck outside the abdomen, looks discolored, feels hard, or is tender.   You have any changes in your bowel habits or in the hernia that is unusual for you.  LET YOUR CAREGIVERS KNOW ABOUT THE FOLLOWING:  Allergies.   Medications taken including herbs, eye drops, over the counter medications, and creams.   Use of steroids (by mouth or creams).   Family or personal history of problems with anesthetics or Novocaine.   Possibility of pregnancy, if this applies.   Personal history of blood clots (thrombophlebitis).   Family or personal history of bleeding or blood problems.   Previous surgery.   Other health problems.  BEFORE THE PROCEDURE You should be present 1 hour prior to your procedure, or as directed by your caregiver.  AFTER THE PROCEDURE After surgery, you will be taken to the recovery area. A nurse will watch and check your progress there. Once   you are awake, stable, and taking fluids well, you will be allowed to go home as long as there are no problems. Once home, an ice pack (wrapped in a light towel) applied to your operative site may help with discomfort. It may also keep the swelling down. Do not lift anything heavier than 10 pounds (4.55 kilograms). Take showers not baths. Do not drive while taking narcotics. Follow instructions as suggested by  your caregiver.  SEEK IMMEDIATE MEDICAL CARE IF: After surgery:  There is redness, swelling, or increasing pain in the wound.   There is pus coming from the wound.   There is drainage from a wound lasting longer than 1 day.   An unexplained oral temperature above 102 F (38.9 C) develops.   You notice a foul smell coming from the wound or dressing.   There is a breaking open of a wound (edged not staying together) after the sutures have been removed.   You notice increasing pain in the shoulders (shoulder strap areas).   You develop dizzy episodes or fainting while standing.   You develop persistent nausea or vomiting.   You develop a rash.   You have difficulty breathing.   You develop any reaction or side effects to medications given.  MAKE SURE YOU:   Understand these instructions.   Will watch your condition.   Will get help right away if you are not doing well or get worse.   

## 2011-09-19 ENCOUNTER — Other Ambulatory Visit (INDEPENDENT_AMBULATORY_CARE_PROVIDER_SITE_OTHER): Payer: Self-pay | Admitting: General Surgery

## 2011-09-19 DIAGNOSIS — K4090 Unilateral inguinal hernia, without obstruction or gangrene, not specified as recurrent: Secondary | ICD-10-CM

## 2011-09-21 ENCOUNTER — Telehealth (INDEPENDENT_AMBULATORY_CARE_PROVIDER_SITE_OTHER): Payer: Self-pay

## 2011-09-21 DIAGNOSIS — K4020 Bilateral inguinal hernia, without obstruction or gangrene, not specified as recurrent: Secondary | ICD-10-CM | POA: Insufficient documentation

## 2011-09-21 NOTE — Telephone Encounter (Signed)
LM w/ pt's husband that I would call her as soon as I heard from Dr. Blanchie Dessert nurse re: cardiac clearance.

## 2011-09-21 NOTE — Progress Notes (Signed)
Chief Complaint  Patient presents with  . Pre-op Exam    eval Rt hepatic lesion    HISTORY: Pt is 76 year old female with new onset right groin pain.  She tripped, catching herself, around 3 weeks ago.  Since that time, she has had significant right groin pain.  She has had her hip evaluated, and has no evidence for fracture.  She has not personally noted groin bulge.  She has no known history of hernia.  She has no nausea or vomiting.  She has had some constipation.  She had CT scan that demonstrated bilateral fat containing inguinal hernias.  She also had liver lesion seen on CT that appeared to be hemangioma.    Past Medical History  Diagnosis Date  . Hypertension   . Osteoporosis   . Arthritis   . Hyperlipidemia   . Diastolic heart failure secondary to coronary artery disease     Past Surgical History  Procedure Date  . Abdominal hysterectomy   . Hemorrhoid surgery   . Hernia repair     Current Outpatient Prescriptions  Medication Sig Dispense Refill  . aspirin 81 MG tablet Take 81 mg by mouth daily.      . hydrochlorothiazide (,MICROZIDE/HYDRODIURIL,) 12.5 MG capsule Take 12.5 mg by mouth daily.        . ranolazine (RANEXA) 500 MG 12 hr tablet Take 500 mg by mouth daily.         Allergies  Allergen Reactions  . Aspirin     REACTION: nose bleeds  . Atorvastatin     REACTION: muscle pain  . Ciprofloxacin     REACTION: nausea  . Clarithromycin   . Colestipol Hcl     REACTION: hallucinations  . Ezetimibe     REACTION: GI upset  . Penicillins     REACTION: rash  . Simvastatin     REACTION: muscle spasms     Family History  Problem Relation Age of Onset  . Heart disease Father   . Cancer Sister     lung  . Cancer Brother     lung     History   Social History  . Marital Status: Married    Spouse Name: N/A    Number of Children: N/A  . Years of Education: N/A   Social History Main Topics  . Smoking status: Never Smoker   . Smokeless tobacco: None    . Alcohol Use: No  . Drug Use: No  . Sexually Active:     REVIEW OF SYSTEMS - PERTINENT POSITIVES ONLY: 12 point review of systems negative other than HPI and PMH except for constipation.    EXAM: Filed Vitals:   09/17/11 0852  BP: 142/83  Pulse: 74  Temp: 99 F (37.2 C)    Gen:  No acute distress.  Well nourished and well groomed.   Neurological: Alert and oriented to person, place, and time. Coordination normal.  Head: Normocephalic and atraumatic.  Eyes: Conjunctivae are normal. Pupils are equal, round, and reactive to light. No scleral icterus.  Neck: Normal range of motion. Neck supple. No tracheal deviation or thyromegaly present.  Cardiovascular: Normal rate, regular rhythm, normal heart sounds and intact distal pulses.  Exam reveals no gallop and no friction rub.  No murmur heard. Respiratory: Effort normal.  No respiratory distress. No chest wall tenderness. Breath sounds normal.  No wheezes, rales or rhonchi.  GI: Soft. Bowel sounds are normal. The abdomen is soft and nontender.  There is   no rebound and no guarding.  Musculoskeletal: Normal range of motion. Extremities are nontender.  Lymphadenopathy: No cervical, preauricular, postauricular or axillary adenopathy is present Skin: Skin is warm and dry. No rash noted. No diaphoresis. No erythema. No pallor. No clubbing, cyanosis, or edema.   Psychiatric: Normal mood and affect. Behavior is normal. Judgment and thought content normal.    LABORATORY RESULTS: Available labs are reviewed    RADIOLOGY RESULTS: See E-Chart or I-Site for most recent results.  Images and reports are reviewed.    ASSESSMENT AND PLAN: Bilateral inguinal hernia, right symptomatic Pt with fat containing inguinal hernias.   It is not clear that all of her symptoms are related to the hernia, but she is clearly tender on exam in the right groin over the site of the hernia.   She has had a hysterectomy via midline incision.  This may prevent  us from doing laparoscopic repair.  If so, I would do open repair on the right only.  I discussed the risks of bleeding, infection, damage to adjacent structures, recurrent hernia, risk of pain, risk of open operation.    She wishes to proceed.  We need cardiac clearance. She just saw cardiology 3 weeks ago.       30 minutes were spent with visit.    Redith Drach L Alantra Popoca MD Surgical Oncology, General and Endocrine Surgery [facility]      Visit Diagnoses: 1. Bilateral inguinal hernia, right symptomatic     Primary Care Physician: PICKARD,WARREN TOM, MD    

## 2011-09-21 NOTE — Telephone Encounter (Signed)
LM w/ triage to call regarding the pt's cardiac clearance.

## 2011-09-21 NOTE — Assessment & Plan Note (Signed)
Pt with fat containing inguinal hernias.   It is not clear that all of her symptoms are related to the hernia, but she is clearly tender on exam in the right groin over the site of the hernia.   She has had a hysterectomy via midline incision.  This may prevent [place] from doing laparoscopic repair.  If so, I would do open repair on the right only.  I discussed the risks of bleeding, infection, damage to adjacent structures, recurrent hernia, risk of pain, risk of open operation.    She wishes to proceed.  We need cardiac clearance. She just saw cardiology 3 weeks ago.

## 2011-09-21 NOTE — Addendum Note (Signed)
Addended by: Almond Lint on: 09/21/2011 09:20 PM   Modules accepted: Orders

## 2011-09-25 ENCOUNTER — Encounter (HOSPITAL_COMMUNITY): Payer: Self-pay | Admitting: Pharmacy Technician

## 2011-09-25 ENCOUNTER — Telehealth (INDEPENDENT_AMBULATORY_CARE_PROVIDER_SITE_OTHER): Payer: Self-pay

## 2011-09-25 NOTE — Telephone Encounter (Signed)
Contacted pt that CC is still pending with [facility].

## 2011-10-08 ENCOUNTER — Telehealth (INDEPENDENT_AMBULATORY_CARE_PROVIDER_SITE_OTHER): Payer: Self-pay | Admitting: General Surgery

## 2011-10-08 ENCOUNTER — Encounter (HOSPITAL_COMMUNITY): Payer: Self-pay

## 2011-10-08 ENCOUNTER — Encounter (HOSPITAL_COMMUNITY)
Admission: RE | Admit: 2011-10-08 | Discharge: 2011-10-08 | Disposition: A | Discharge status: Home / Self Care | Payer: Medicare Other | Source: Ambulatory Visit | Attending: General Surgery | Admitting: General Surgery

## 2011-10-08 HISTORY — DX: Nocturia: R35.1

## 2011-10-08 HISTORY — DX: Anxiety disorder, unspecified: F41.9

## 2011-10-08 HISTORY — DX: Personal history of urinary calculi: Z87.442

## 2011-10-08 HISTORY — DX: Dizziness and giddiness: R42

## 2011-10-08 LAB — BASIC METABOLIC PANEL
Calcium: 9.6 mg/dL (ref 8.4–10.5)
GFR calc non Af Amer: 61 mL/min — ABNORMAL LOW (ref >90)
Sodium: 140 mEq/L (ref 135–145)

## 2011-10-08 LAB — CBC
Platelets: 236 10*3/uL (ref 150–400)
RBC: 5.02 MIL/uL (ref 3.87–5.11)
WBC: 7.8 10*3/uL (ref 4.0–10.5)

## 2011-10-08 LAB — SURGICAL PCR SCREEN
MRSA, PCR: NEGATIVE
Staphylococcus aureus: NEGATIVE

## 2011-10-08 NOTE — Pre-Procedure Instructions (Signed)
20 Lauren House  10/08/2011   Your procedure is scheduled on:  Thursday October 11, 2011  Report to [facility] at 9:00 AM.  Call this number if you have problems the morning of surgery: [phone]   Remember:   Do not eat food or drink:After Midnight.    Take these medicines the morning of surgery with A SIP OF WATER: tylenol, ranexa    DISCONTINUE ASPIRIN, PLAVIX, COUMADIN, EFFIENT, AND HERBAL MEDICATIONS   Do not wear jewelry, make-up or nail polish.  Do not wear lotions, powders, or perfumes.  Do not shave 48 hours prior to surgery. Men may shave face and neck.  Do not bring valuables to the hospital.  Contacts, dentures or bridgework may not be worn into surgery.  Leave suitcase in the car. After surgery it may be brought to your room.  For patients admitted to the hospital, checkout time is 11:00 AM the day of discharge.   Patients discharged the day of surgery will not be allowed to drive home.  Special Instructions: CHG Shower Use Special Wash: 1/2 bottle night before surgery and 1/2 bottle morning of surgery.   Please read over the following fact sheets that you were given: Pain Booklet, Coughing and Deep Breathing, MRSA Information and Surgical Site Infection Prevention

## 2011-10-08 NOTE — Addendum Note (Signed)
Addended by: Almond Lint on: 10/08/2011 10:10 PM   Modules accepted: Orders

## 2011-10-08 NOTE — Consult Note (Signed)
Anesthesia chart review: Patient is a 76 year old female posted for right IHR on 10/11/11 by Dr. Donell Beers.  History includes apical hypertrophic cardiomyopathy ("confirmed by cardiac MRI" according to Dr. Blanchie Dessert notes), chronic chest pain secondary to apical hypertrophic cardiomyopathy, CHF, chronic DOE, hypertension, vertigo, osteoporosis, hyperlipidemia, anxiety, kidney stones, skin cancer (basal cell), non-smoker.    Her Cardiologist is Dr. Rennis Golden [facility]).  He last saw her on 08/29/2011 and categorized her as low risk and did not recommend further cardiac testing prior to surgery.    Cardiac cath on 02/09/2010 showed nonspecific coronary artery disease with 50-60% LAD, 30% ostial PL, EF at least 65%.  (See Notes tab for full report.)  Nuclear stress test on 02/02/2010 [facility]) showed normal myocardial perfusion, EF 62%.  Echo on 11/24/09 [facility]) showed LVEF greater than 55%, severe, concentric LV wall thickening with increased echogenicity, vonsider infiltrative process, such as amyloidosis or less likely sarcoidosis, Loeffler syndrome. It was felt atypical for hypertensive heart disease. Normal LA size, stage I, and impaired diastolic function, elevated LV filling pressure, aortic bowel sclerosis without significant stenosis, trace MR, trace, TR.  EKG on 10/08/11 showed NSR, LVH, prolonged QT, rather diffuse T wave abnormality--consider inferior and anterolateral ischemia.  Overall, I think it is similar to her EKG at [facility] on 08/29/11.    CXR on 10/08/11 showed minimal linear ATX or pleural thickening along the minor fissure on the right, otherwise no significant abnormalities.  CBC and BMET from 10/08/11 noted.  Plan to proceed if no significant change in her status.  Shonna Chock, PA-C

## 2011-10-08 NOTE — Telephone Encounter (Signed)
STACEY CALLED TO REQUEST THAT DR. BYERLY CHANGE SURGERY CONSENT FROM BILATERAL INGUINAL HERNIA TO RIGHT INGUINAL HERNIA AS BOOKED. THANKS/GY/ SHORT SAY- [id number].

## 2011-10-08 NOTE — Progress Notes (Addendum)
Dr. Lynnea Ferrier in [place] is primary physician   Dr. Rennis Golden - Cardiologist - EKG performed 08/29/11, Echo and stress test in 2011, cardiac cath in 2011 at [facility] Dr. Arita Miss office due to consent stating bilateral hernia repair but pt states only right being repaired and also needing cardiac clearance note for our records.   Received call back from Dr. Arita Miss office stating they had received cardiac clearance and was scanning it into epic shortly.

## 2011-10-09 ENCOUNTER — Ambulatory Visit (INDEPENDENT_AMBULATORY_CARE_PROVIDER_SITE_OTHER): Payer: Medicare Other | Admitting: General Surgery

## 2011-10-10 MED ORDER — CLINDAMYCIN PHOSPHATE 900 MG/50ML IV SOLN
900.0000 mg | Freq: Once | INTRAVENOUS | Status: AC
  Administered 2011-10-11: 900 mg via INTRAVENOUS
  Filled 2011-10-10: qty 50

## 2011-10-10 MED ORDER — CHLORHEXIDINE GLUCONATE 4 % EX LIQD: 1.0000 "application " | Freq: Once | CUTANEOUS | Status: DC

## 2011-10-11 ENCOUNTER — Encounter (HOSPITAL_COMMUNITY)
Admission: RE | Disposition: A | Discharge status: Home / Self Care | Payer: Self-pay | Source: Ambulatory Visit | Attending: General Surgery

## 2011-10-11 ENCOUNTER — Encounter (HOSPITAL_COMMUNITY): Payer: Self-pay | Admitting: *Deleted

## 2011-10-11 ENCOUNTER — Encounter (HOSPITAL_COMMUNITY): Payer: Self-pay | Admitting: Vascular Surgery

## 2011-10-11 ENCOUNTER — Ambulatory Visit (HOSPITAL_COMMUNITY)
Admission: RE | Admit: 2011-10-11 | Discharge: 2011-10-11 | Disposition: A | Discharge status: Home / Self Care | Payer: Medicare Other | Source: Ambulatory Visit | Attending: General Surgery | Admitting: General Surgery

## 2011-10-11 ENCOUNTER — Ambulatory Visit (HOSPITAL_COMMUNITY): Payer: Medicare Other | Admitting: Vascular Surgery

## 2011-10-11 DIAGNOSIS — E785 Hyperlipidemia, unspecified: Secondary | ICD-10-CM | POA: Insufficient documentation

## 2011-10-11 DIAGNOSIS — K4090 Unilateral inguinal hernia, without obstruction or gangrene, not specified as recurrent: Secondary | ICD-10-CM

## 2011-10-11 DIAGNOSIS — M810 Age-related osteoporosis without current pathological fracture: Secondary | ICD-10-CM | POA: Insufficient documentation

## 2011-10-11 DIAGNOSIS — Z01810 Encounter for preprocedural cardiovascular examination: Secondary | ICD-10-CM | POA: Insufficient documentation

## 2011-10-11 DIAGNOSIS — Z01812 Encounter for preprocedural laboratory examination: Secondary | ICD-10-CM | POA: Insufficient documentation

## 2011-10-11 DIAGNOSIS — Z01818 Encounter for other preprocedural examination: Secondary | ICD-10-CM | POA: Insufficient documentation

## 2011-10-11 DIAGNOSIS — I10 Essential (primary) hypertension: Secondary | ICD-10-CM | POA: Insufficient documentation

## 2011-10-11 HISTORY — PX: INGUINAL HERNIA REPAIR: SHX194

## 2011-10-11 LAB — GLUCOSE, CAPILLARY: Glucose-Capillary: 109 mg/dL — ABNORMAL HIGH (ref 70–99)

## 2011-10-11 SURGERY — REPAIR, HERNIA, INGUINAL, ADULT
Anesthesia: General | Site: Abdomen | Laterality: Right | Wound class: Clean

## 2011-10-11 MED ORDER — LIDOCAINE HCL (CARDIAC) 20 MG/ML IV SOLN
INTRAVENOUS | Status: DC | PRN
  Administered 2011-10-11: 9 mg via INTRAVENOUS

## 2011-10-11 MED ORDER — BUPIVACAINE LIPOSOME 1.3 % IJ SUSP: 20.0000 mL | Freq: Once | INTRAMUSCULAR | Status: DC

## 2011-10-11 MED ORDER — FENTANYL CITRATE 0.05 MG/ML IJ SOLN
INTRAMUSCULAR | Status: DC | PRN
  Administered 2011-10-11 (×2): 50 ug via INTRAVENOUS

## 2011-10-11 MED ORDER — LACTATED RINGERS IV SOLN
INTRAVENOUS | Status: DC
Start: 2011-10-11 — End: 2011-10-11
  Administered 2011-10-11: 10:00:00 via INTRAVENOUS

## 2011-10-11 MED ORDER — LIDOCAINE HCL (PF) 1 % IJ SOLN
INTRAMUSCULAR | Status: DC | PRN
  Administered 2011-10-11: 30 mL

## 2011-10-11 MED ORDER — HYDROCODONE-ACETAMINOPHEN 5-325 MG PO TABS
ORAL_TABLET | ORAL | Status: AC
  Administered 2011-10-11: 1 via ORAL
  Filled 2011-10-11: qty 1

## 2011-10-11 MED ORDER — HYDROCODONE-ACETAMINOPHEN 5-325 MG PO TABS: 1.0000 | ORAL_TABLET | Freq: Four times a day (QID) | ORAL | Status: AC | PRN

## 2011-10-11 MED ORDER — ONDANSETRON HCL 4 MG/2ML IJ SOLN
INTRAMUSCULAR | Status: DC | PRN
  Administered 2011-10-11: 4 mg via INTRAVENOUS

## 2011-10-11 MED ORDER — MEPERIDINE HCL 25 MG/ML IJ SOLN: 6.2500 mg | INTRAMUSCULAR | Status: DC | PRN

## 2011-10-11 MED ORDER — BUPIVACAINE-EPINEPHRINE PF 0.25-1:200000 % IJ SOLN
INTRAMUSCULAR | Status: AC
  Filled 2011-10-11: qty 30

## 2011-10-11 MED ORDER — PROMETHAZINE HCL 25 MG/ML IJ SOLN: 6.2500 mg | INTRAMUSCULAR | Status: DC | PRN

## 2011-10-11 MED ORDER — BUPIVACAINE LIPOSOME 1.3 % IJ SUSP
INTRAMUSCULAR | Status: DC | PRN
  Administered 2011-10-11: 20 mL

## 2011-10-11 MED ORDER — LIDOCAINE HCL (PF) 1 % IJ SOLN
INTRAMUSCULAR | Status: AC
  Filled 2011-10-11: qty 30

## 2011-10-11 MED ORDER — LACTATED RINGERS IV SOLN
INTRAVENOUS | Status: DC | PRN
  Administered 2011-10-11 (×2): via INTRAVENOUS

## 2011-10-11 MED ORDER — BUPIVACAINE-EPINEPHRINE 0.25% -1:200000 IJ SOLN
INTRAMUSCULAR | Status: DC | PRN
  Administered 2011-10-11: 30 mL

## 2011-10-11 MED ORDER — HYDROCODONE-ACETAMINOPHEN 5-325 MG PO TABS: 1.0000 | ORAL_TABLET | Freq: Four times a day (QID) | ORAL | Status: DC | PRN

## 2011-10-11 MED ORDER — HYDROCODONE-ACETAMINOPHEN 5-325 MG PO TABS
1.0000 | ORAL_TABLET | Freq: Four times a day (QID) | ORAL | Status: DC | PRN
  Administered 2011-10-11: 1 via ORAL

## 2011-10-11 MED ORDER — DEXTROSE 5 % IV SOLN
INTRAVENOUS | Status: DC | PRN
  Administered 2011-10-11: 11:00:00 via INTRAVENOUS

## 2011-10-11 MED ORDER — 0.9 % SODIUM CHLORIDE (POUR BTL) OPTIME
TOPICAL | Status: DC | PRN
  Administered 2011-10-11: 1000 mL

## 2011-10-11 MED ORDER — HYDROMORPHONE HCL PF 1 MG/ML IJ SOLN: 0.2500 mg | INTRAMUSCULAR | Status: DC | PRN

## 2011-10-11 MED ORDER — MIDAZOLAM HCL 5 MG/5ML IJ SOLN
INTRAMUSCULAR | Status: DC | PRN
  Administered 2011-10-11: 1 mg via INTRAVENOUS

## 2011-10-11 MED ORDER — PROPOFOL 10 MG/ML IV EMUL
INTRAVENOUS | Status: DC | PRN
  Administered 2011-10-11: 110 mg via INTRAVENOUS

## 2011-10-11 MED ORDER — EPHEDRINE SULFATE 50 MG/ML IJ SOLN
INTRAMUSCULAR | Status: DC | PRN
  Administered 2011-10-11: 15 mg via INTRAVENOUS

## 2011-10-11 SURGICAL SUPPLY — 50 items
BLADE SURG 10 STRL SS (BLADE) ×3 IMPLANT
BLADE SURG 15 STRL LF DISP TIS (BLADE) ×2 IMPLANT
BLADE SURG 15 STRL SS (BLADE) ×2
BLADE SURG ROTATE 9660 (MISCELLANEOUS) IMPLANT
CANISTER SUCTION 2500CC (MISCELLANEOUS) ×3 IMPLANT
CHLORAPREP W/TINT 26ML (MISCELLANEOUS) ×3 IMPLANT
CLOTH BEACON ORANGE TIMEOUT ST (SAFETY) ×3 IMPLANT
COVER SURGICAL LIGHT HANDLE (MISCELLANEOUS) ×3 IMPLANT
DECANTER SPIKE VIAL GLASS SM (MISCELLANEOUS) IMPLANT
DERMABOND ADVANCED (GAUZE/BANDAGES/DRESSINGS) ×1
DERMABOND ADVANCED .7 DNX12 (GAUZE/BANDAGES/DRESSINGS) ×2 IMPLANT
DRAIN PENROSE 1/2X12 LTX STRL (WOUND CARE) IMPLANT
DRAPE LAPAROTOMY TRNSV 102X78 (DRAPE) ×3 IMPLANT
DRAPE UTILITY 15X26 W/TAPE STR (DRAPE) ×6 IMPLANT
ELECT CAUTERY BLADE 6.4 (BLADE) ×3 IMPLANT
ELECT REM PT RETURN 9FT ADLT (ELECTROSURGICAL) ×3
ELECTRODE REM PT RTRN 9FT ADLT (ELECTROSURGICAL) ×2 IMPLANT
GLOVE BIO SURGEON STRL SZ 6 (GLOVE) ×3 IMPLANT
GLOVE BIO SURGEON STRL SZ 6.5 (GLOVE) ×3 IMPLANT
GLOVE BIOGEL PI IND STRL 6.5 (GLOVE) ×2 IMPLANT
GLOVE BIOGEL PI IND STRL 7.0 (GLOVE) ×4 IMPLANT
GLOVE BIOGEL PI INDICATOR 6.5 (GLOVE) ×1
GLOVE BIOGEL PI INDICATOR 7.0 (GLOVE) ×2
GLOVE SURG SS PI 6.5 STRL IVOR (GLOVE) ×3 IMPLANT
GOWN PREVENTION PLUS XXLARGE (GOWN DISPOSABLE) ×6 IMPLANT
GOWN STRL NON-REIN LRG LVL3 (GOWN DISPOSABLE) ×3 IMPLANT
KIT BASIN OR (CUSTOM PROCEDURE TRAY) ×3 IMPLANT
KIT ROOM TURNOVER OR (KITS) ×3 IMPLANT
MESH PARIETEX PROGRIP RIGHT (Mesh General) ×3 IMPLANT
NEEDLE HYPO 25GX1X1/2 BEV (NEEDLE) ×3 IMPLANT
NS IRRIG 1000ML POUR BTL (IV SOLUTION) ×3 IMPLANT
PACK SURGICAL SETUP 50X90 (CUSTOM PROCEDURE TRAY) ×3 IMPLANT
PAD ARMBOARD 7.5X6 YLW CONV (MISCELLANEOUS) ×3 IMPLANT
PENCIL BUTTON HOLSTER BLD 10FT (ELECTRODE) ×3 IMPLANT
SPONGE INTESTINAL PEANUT (DISPOSABLE) ×3 IMPLANT
SPONGE LAP 18X18 X RAY DECT (DISPOSABLE) ×3 IMPLANT
SUT MNCRL AB 4-0 PS2 18 (SUTURE) ×3 IMPLANT
SUT PROLENE 2 0 SH 30 (SUTURE) ×3 IMPLANT
SUT SILK 2 0 SH (SUTURE) ×3 IMPLANT
SUT VIC AB 2-0 SH 27 (SUTURE) ×1
SUT VIC AB 2-0 SH 27X BRD (SUTURE) ×2 IMPLANT
SUT VIC AB 3-0 SH 27 (SUTURE) ×1
SUT VIC AB 3-0 SH 27X BRD (SUTURE) ×2 IMPLANT
SUT VIC AB 3-0 SH 27XBRD (SUTURE) IMPLANT
SYR BULB 3OZ (MISCELLANEOUS) ×3 IMPLANT
SYR CONTROL 10ML LL (SYRINGE) ×3 IMPLANT
TOWEL OR 17X24 6PK STRL BLUE (TOWEL DISPOSABLE) ×3 IMPLANT
TOWEL OR 17X26 10 PK STRL BLUE (TOWEL DISPOSABLE) ×3 IMPLANT
TUBE CONNECTING 12X1/4 (SUCTIONS) ×3 IMPLANT
YANKAUER SUCT BULB TIP NO VENT (SUCTIONS) ×3 IMPLANT

## 2011-10-11 NOTE — Anesthesia Procedure Notes (Signed)
Procedure Name: LMA Insertion Date/Time: 10/11/2011 10:45 AM Performed by: Darcey Nora B Pre-anesthesia Checklist: Patient identified, Emergency Drugs available, Suction available and Patient being monitored Patient Re-evaluated:Patient Re-evaluated prior to inductionOxygen Delivery Method: Circle system utilized Preoxygenation: Pre-oxygenation with 100% oxygen Ventilation: Mask ventilation without difficulty LMA: LMA with gastric port inserted LMA Size: 4.0 Number of attempts: 1 Placement Confirmation: positive ETCO2 and breath sounds checked- equal and bilateral ETT to lip (cm): taped across flange to cheeks. Tube secured with: Tape Dental Injury: Teeth and Oropharynx as per pre-operative assessment

## 2011-10-11 NOTE — Anesthesia Preprocedure Evaluation (Addendum)
Anesthesia Evaluation  Patient identified by MRN, date of birth, ID band Patient awake    Reviewed: Allergy & Precautions, H&P , NPO status , Patient's Chart, lab work & pertinent test results, reviewed documented beta blocker date and time   Airway Mallampati: II TM Distance: >3 FB Neck ROM: Limited  Mouth opening: Limited Mouth Opening  Dental  (+) Teeth Intact   Pulmonary  breath sounds clear to auscultation        Cardiovascular hypertension, Pt. on home beta blockers + angina at rest + CAD Rhythm:Regular Rate:Normal  ECG 10/08/2011 highly abnormal consistent with a previous one and has cardiac clearance from Dr. Rennis Golden. Studies done in 2011 CAD with 60% LAD.  Pt with occasional chest pressure and NTG 9the last 3 mos ago).   Neuro/Psych    GI/Hepatic   Endo/Other    Renal/GU      Musculoskeletal   Abdominal   Peds  Hematology   Anesthesia Other Findings Admits to jaw sore and tight.  Slight receding chin.  Reproductive/Obstetrics                         Anesthesia Physical Anesthesia Plan  ASA: III  Anesthesia Plan: General   Post-op Pain Management:    Induction: Intravenous  Airway Management Planned: LMA  Additional Equipment:   Intra-op Plan:   Post-operative Plan: Extubation in OR  Informed Consent: I have reviewed the patients History and Physical, chart, labs and discussed the procedure including the risks, benefits and alternatives for the proposed anesthesia with the patient or authorized representative who has indicated his/her understanding and acceptance.     Plan Discussed with: CRNA and Surgeon  Anesthesia Plan Comments:         Anesthesia Quick Evaluation

## 2011-10-11 NOTE — Progress Notes (Addendum)
Patient arrived from PACU in wheelchair. Upon transfer from wheelchair to recliner, patient's pain level increased to a 4. She stated she would like something for her pain so she would not be hurting in the car. Dr. Donell Beers called. Received order for 1-2 Norco 5/325 PO tablets. Pain reassessed upon discharge and patient states her pain is better since receiving pill.

## 2011-10-11 NOTE — Preoperative (Signed)
Beta Blockers   Reason not to administer Beta Blockers:Lopressor 0730 today

## 2011-10-11 NOTE — Anesthesia Postprocedure Evaluation (Signed)
  Anesthesia Post-op Note  Patient: Lauren House  Procedure(s) Performed: Procedure(s) (LRB): HERNIA REPAIR INGUINAL ADULT (Right) INSERTION OF MESH (N/A)  Patient Location: PACU  Anesthesia Type: General  Level of Consciousness: awake  Airway and Oxygen Therapy: Patient Spontanous Breathing  Post-op Pain: mild  Post-op Assessment: Post-op Vital signs reviewed  Post-op Vital Signs: stable  Complications: No apparent anesthesia complications

## 2011-10-11 NOTE — Op Note (Signed)
Hernia, Open, Procedure Note  Indications: The patient presented with a history of a right reducible inguinal hernia.    Pre-operative Diagnosis: right reducible inguinal hernia  Post-operative Diagnosis: same  Surgeon: Almond Lint   Assistant:  THOMAS,ALICIA  Anesthesia: General endotracheal anesthesia and Exparel  ASA Class: 3  Findings:  Small indirect inguinal hernia sac, large lipoma in hernia canal.    Specimen:  None  Procedure Details  The patient was seen again in the Holding Room. The risks, benefits, complications, treatment options, and expected outcomes were discussed with the patient. The possibilities of perforation of viscus, bleeding, infection, the need for additional procedures, and development of chronic pain or numbness were discussed with the patient and/or family. There was concurrence with the proposed plan, and informed consent was obtained. The site of surgery was properly noted/marked. The patient was taken to the Operating Room, and the procedure verified as hernia repair. A Time Out was held and the above information confirmed.  The patient was placed in the supine position and underwent induction of anesthesia, the lower abdomen and groin was prepped and draped in the standard fashion.  An obliquely oriented transverse incision was made. Dissection was carried through the soft tissue to expose the inguinal canal and inguinal ligament along its lower edge. The external oblique fascia was split along the course of its fibers, exposing the inguinal canal. The iliohypogastric nerve was superior to the superior edge of the external oblique, and this was retracted out of the way.      The round ligament, cord lipoma, and hernia sac were elevated off the pubic tubercle.    There was no sliding component to the hernia, and no intraabdominal organs were in the sac.  The sac was closed with a 2-0 silk pursestring suture and reduced into the abdomen.  The right sided  Progrip mesh was selected.   This was secured to the pubic tubercle with a 2-0 Prolene suture.  The mesh was laid down flat underneath the external oblique.  The ilioinguinal nerve was laid on top of the mesh.   The entire area was anesthetized with Exparel.  The external oblique fashion was then closed in a continuous fashion using 2-0 Vicryl suture taking care not to cause entrapment.  The Scarpa's fascia was closed with a running 3-0 vicryl.  The skin was then closed with interrupted 3-0 vicryl deep dermal sutures and a running 4-0 Monocryl suture.  The skin was cleaned, dried, and dressed with Dermabond.

## 2011-10-11 NOTE — Interval H&P Note (Signed)
History and Physical Interval Note:  10/11/2011 10:24 AM  Lauren House  has presented today for surgery, with the diagnosis of inguinal hernia, right  The various methods of treatment have been discussed with the patient and family. After consideration of risks, benefits and other options for treatment, the patient has consented to  Procedure(s) (LRB): HERNIA REPAIR INGUINAL ADULT (Right) INSERTION OF MESH (N/A) as a surgical intervention .  The patient's history has been reviewed, patient examined, no change in status, stable for surgery.  I have reviewed the patient's chart and labs.  Questions were answered to the patient's satisfaction.     Cherlyn Syring

## 2011-10-11 NOTE — Transfer of Care (Signed)
Immediate Anesthesia Transfer of Care Note  Patient: Lauren House  Procedure(s) Performed: Procedure(s) (LRB): HERNIA REPAIR INGUINAL ADULT (Right) INSERTION OF MESH (N/A)  Patient Location: PACU  Anesthesia Type: General  Level of Consciousness: awake, alert  and patient cooperative  Airway & Oxygen Therapy: Patient Spontanous Breathing and Patient connected to nasal cannula oxygen  Post-op Assessment: Report given to PACU RN, Post -op Vital signs reviewed and stable and Patient moving all extremities  Post vital signs: Reviewed and stable  Complications: No apparent anesthesia complications

## 2011-10-11 NOTE — H&P (View-Only) (Signed)
Chief Complaint  Patient presents with  . Pre-op Exam    eval Rt hepatic lesion    HISTORY: Pt is 76 year old female with new onset right groin pain.  She tripped, catching herself, around 3 weeks ago.  Since that time, she has had significant right groin pain.  She has had her hip evaluated, and has no evidence for fracture.  She has not personally noted groin bulge.  She has no known history of hernia.  She has no nausea or vomiting.  She has had some constipation.  She had CT scan that demonstrated bilateral fat containing inguinal hernias.  She also had liver lesion seen on CT that appeared to be hemangioma.    Past Medical History  Diagnosis Date  . Hypertension   . Osteoporosis   . Arthritis   . Hyperlipidemia   . Diastolic heart failure secondary to coronary artery disease     Past Surgical History  Procedure Date  . Abdominal hysterectomy   . Hemorrhoid surgery   . Hernia repair     Current Outpatient Prescriptions  Medication Sig Dispense Refill  . aspirin 81 MG tablet Take 81 mg by mouth daily.      . hydrochlorothiazide (,MICROZIDE/HYDRODIURIL,) 12.5 MG capsule Take 12.5 mg by mouth daily.        . ranolazine (RANEXA) 500 MG 12 hr tablet Take 500 mg by mouth daily.         Allergies  Allergen Reactions  . Aspirin     REACTION: nose bleeds  . Atorvastatin     REACTION: muscle pain  . Ciprofloxacin     REACTION: nausea  . Clarithromycin   . Colestipol Hcl     REACTION: hallucinations  . Ezetimibe     REACTION: GI upset  . Penicillins     REACTION: rash  . Simvastatin     REACTION: muscle spasms     Family History  Problem Relation Age of Onset  . Heart disease Father   . Cancer Sister     lung  . Cancer Brother     lung     History   Social History  . Marital Status: Married    Spouse Name: N/A    Number of Children: N/A  . Years of Education: N/A   Social History Main Topics  . Smoking status: Never Smoker   . Smokeless tobacco: None    . Alcohol Use: No  . Drug Use: No  . Sexually Active:     REVIEW OF SYSTEMS - PERTINENT POSITIVES ONLY: 12 point review of systems negative other than HPI and PMH except for constipation.    EXAM: Filed Vitals:   09/17/11 0852  BP: 142/83  Pulse: 74  Temp: 99 F (37.2 C)    Gen:  No acute distress.  Well nourished and well groomed.   Neurological: Alert and oriented to person, place, and time. Coordination normal.  Head: Normocephalic and atraumatic.  Eyes: Conjunctivae are normal. Pupils are equal, round, and reactive to light. No scleral icterus.  Neck: Normal range of motion. Neck supple. No tracheal deviation or thyromegaly present.  Cardiovascular: Normal rate, regular rhythm, normal heart sounds and intact distal pulses.  Exam reveals no gallop and no friction rub.  No murmur heard. Respiratory: Effort normal.  No respiratory distress. No chest wall tenderness. Breath sounds normal.  No wheezes, rales or rhonchi.  GI: Soft. Bowel sounds are normal. The abdomen is soft and nontender.  There is  no rebound and no guarding.  Musculoskeletal: Normal range of motion. Extremities are nontender.  Lymphadenopathy: No cervical, preauricular, postauricular or axillary adenopathy is present Skin: Skin is warm and dry. No rash noted. No diaphoresis. No erythema. No pallor. No clubbing, cyanosis, or edema.   Psychiatric: Normal mood and affect. Behavior is normal. Judgment and thought content normal.    LABORATORY RESULTS: Available labs are reviewed    RADIOLOGY RESULTS: See E-Chart or I-Site for most recent results.  Images and reports are reviewed.    ASSESSMENT AND PLAN: Bilateral inguinal hernia, right symptomatic Pt with fat containing inguinal hernias.   It is not clear that all of her symptoms are related to the hernia, but she is clearly tender on exam in the right groin over the site of the hernia.   She has had a hysterectomy via midline incision.  This may prevent  [place] from doing laparoscopic repair.  If so, I would do open repair on the right only.  I discussed the risks of bleeding, infection, damage to adjacent structures, recurrent hernia, risk of pain, risk of open operation.    She wishes to proceed.  We need cardiac clearance. She just saw cardiology 3 weeks ago.       30 minutes were spent with visit.    Maudry Diego MD Surgical Oncology, General and Endocrine Surgery [facility]      Visit Diagnoses: 1. Bilateral inguinal hernia, right symptomatic     Primary Care Physician: Leo Grosser, MD

## 2011-10-12 ENCOUNTER — Encounter (HOSPITAL_COMMUNITY): Payer: Self-pay | Admitting: General Surgery

## 2011-11-05 ENCOUNTER — Ambulatory Visit (INDEPENDENT_AMBULATORY_CARE_PROVIDER_SITE_OTHER): Payer: Medicare Other | Admitting: General Surgery

## 2011-11-05 VITALS — BP 142/80 | HR 84 | Temp 98.0°F | Resp 18 | Ht 67.0 in | Wt 149.2 lb

## 2011-11-05 DIAGNOSIS — K4020 Bilateral inguinal hernia, without obstruction or gangrene, not specified as recurrent: Secondary | ICD-10-CM

## 2011-11-05 NOTE — Patient Instructions (Signed)
Take tylenol as needed.    Follow up as needed.  Ok to drive.  Continue no lifting over 20-30 pounds for another 3 weeks.

## 2011-11-05 NOTE — Progress Notes (Signed)
HISTORY: Pt having some bloating, but minimal soreness.  Has been doing light housework already. She has not taken narcotics because they make her feel bad.      EXAM: General:  Alert and oriented.   Incision:  Healing ridge.  Looks appropriate.     PATHOLOGY: n/a   ASSESSMENT AND PLAN:   Bilateral inguinal hernia, right symptomatic No evidence of post surgical complications.  Liberalize activities but no heavy lifting for another 3 weeks.  Follow up as needed.  Added CHG to allergies.        Maudry Diego, MD Surgical Oncology, General & Endocrine Surgery [facility]  Leo Grosser, MD Donita Brooks, MD

## 2011-11-05 NOTE — Assessment & Plan Note (Signed)
No evidence of post surgical complications.  Liberalize activities but no heavy lifting for another 3 weeks.  Follow up as needed.  Added CHG to allergies.

## 2011-11-19 ENCOUNTER — Telehealth (INDEPENDENT_AMBULATORY_CARE_PROVIDER_SITE_OTHER): Payer: Self-pay

## 2011-11-19 NOTE — Telephone Encounter (Signed)
Pt inquiring if she could drive - feels much better since 11/05/11 visit and is no longer having pain or taking pain medication.  I told her that it would be fine.

## 2012-01-23 ENCOUNTER — Telehealth (INDEPENDENT_AMBULATORY_CARE_PROVIDER_SITE_OTHER): Payer: Self-pay

## 2012-01-23 NOTE — Telephone Encounter (Signed)
Pt is having increasing pain from inguinal hernia repair in September of this year.  I told her to continue taking her Ibuprofen, but take it every 4 hrs instead of just before bed. She should apply moist warm heat to the area, and try to take frequent breaks off her feet.  I explained hat recovering from this surgery can take months. Call us next week if no better.

## 2012-07-14 ENCOUNTER — Other Ambulatory Visit: Payer: Self-pay | Admitting: Family Medicine

## 2012-07-14 NOTE — Telephone Encounter (Signed)
Med rf °

## 2012-08-15 ENCOUNTER — Ambulatory Visit (INDEPENDENT_AMBULATORY_CARE_PROVIDER_SITE_OTHER): Payer: Medicare Other | Admitting: Internal Medicine

## 2012-08-15 ENCOUNTER — Encounter: Payer: Self-pay | Admitting: Internal Medicine

## 2012-08-15 VITALS — BP 154/90 | HR 58 | Ht 67.0 in | Wt 148.0 lb

## 2012-08-15 DIAGNOSIS — I422 Other hypertrophic cardiomyopathy: Secondary | ICD-10-CM

## 2012-08-15 DIAGNOSIS — I10 Essential (primary) hypertension: Secondary | ICD-10-CM

## 2012-08-15 DIAGNOSIS — R079 Chest pain, unspecified: Secondary | ICD-10-CM

## 2012-08-15 DIAGNOSIS — I868 Varicose veins of other specified sites: Secondary | ICD-10-CM

## 2012-08-15 DIAGNOSIS — I428 Other cardiomyopathies: Secondary | ICD-10-CM

## 2012-08-15 DIAGNOSIS — E785 Hyperlipidemia, unspecified: Secondary | ICD-10-CM

## 2012-08-15 MED ORDER — RANOLAZINE ER 500 MG PO TB12: 500.0000 mg | ORAL_TABLET | Freq: Every day | ORAL | Status: DC

## 2012-08-15 NOTE — Progress Notes (Signed)
OFFICE NOTE  Chief Complaint:  Routine followup  Primary Care Physician: Leo Grosser, MD  HPI:  Lauren House is a pleasant 77 year old female I have been following for history of apical hypertrophic cardiomyopathy for which she has done well on Ranexa 500 mg daily. Recently, she was having right inguinal pain and had a hernia for which she had repair. That, unfortunately, she says has caused her more harm than good and she continues to have more pain in her right hip area which possibly is related to the hip. She saw Dr. Lequita Halt at [facility] for further evaluation from that and has received 2 hip injections which have been beneficial. He did mention that he felt she might need a hip replacement. From a cardiovascular standpoint, I think she would be low risk for any surgery. With regards to her chest pain, it seems to be very well controlled on her current medical regimen and, fortunately, that is not an active issue. She does, as you recall, have bilateral venous reflux and lower extremity varicose veins and spider veins which do give her swelling and discomfort in her legs. However, she has declined treatment for that in the past due to other issues. I prescribed her bilateral compression stockings, however she is hesitant to get them because she does not feel she can put them on.   PMHx:  Past Medical History  Diagnosis Date  . Hypertension   . Osteoporosis   . Arthritis   . Hyperlipidemia     diet controlled.cannot take meds  . Diastolic heart failure secondary to coronary artery disease   . Anginal pain     comes and goes   . CHF (congestive heart failure)   . Anxiety   . Shortness of breath     with exertion  . History of kidney stones   . Vertigo   . Cancer     basal cell on abd. removed  . Nocturia   . Diabetes mellitus     borderline     Past Surgical History  Procedure Laterality Date  . Abdominal hysterectomy    . Hemorrhoid surgery    .  Hernia repair    . Eye surgery      cataracts bilateral  . Cardiac catheterization  2011  . Inguinal hernia repair  10/11/2011    Procedure: HERNIA REPAIR INGUINAL ADULT;  Surgeon: Almond Lint, MD;  Location: [facility] OR;  Service: General;  Laterality: Right;    FAMHx:  Family History  Problem Relation Age of Onset  . Heart disease Father   . Cancer Sister     lung  . Cancer Brother     lung    SOCHx:   reports that she has never smoked. She does not have any smokeless tobacco history on file. She reports that she does not drink alcohol or use illicit drugs.  ALLERGIES:  Allergies  Allergen Reactions  . Chlorhexidine Itching  . Aspirin     REACTION: House bleeds  . Atorvastatin     REACTION: muscle pain  . Ciprofloxacin     REACTION: nausea  . Clarithromycin   . Colestipol Hcl     REACTION: hallucinations  . Ezetimibe     REACTION: GI upset  . Penicillins     REACTION: rash  . Simvastatin     REACTION: muscle spasms  . Tape Rash    ROS: A comprehensive review of systems was negative except for: Cardiovascular: positive for chest pain and palpitations  Musculoskeletal: positive for Right hip pain  HOME MEDS: Current Outpatient Prescriptions  Medication Sig Dispense Refill  . acetaminophen (TYLENOL) 500 MG tablet Take 500 mg by mouth every 6 (six) hours as needed. For headache.      Marland Kitchen aspirin 81 MG tablet Take 81 mg by mouth daily.      . Cyanocobalamin (VITAMIN B12 PO) Take 1 tablet by mouth daily.      . fluticasone (FLONASE) 50 MCG/ACT nasal spray USE TWO SPRAY IN EACH NOSTRIL EVERY DAY  50 g  1  . hydrochlorothiazide (,MICROZIDE/HYDRODIURIL,) 12.5 MG capsule Take 12.5 mg by mouth daily.        . metoprolol tartrate (LOPRESSOR) 12.5 mg TABS Take 12.5 mg by mouth daily.       . Multiple Vitamins-Minerals (OCUVITE PO) Take 1 tablet by mouth daily.      . ranolazine (RANEXA) 500 MG 12 hr tablet Take 1 tablet (500 mg total) by mouth daily.  112 tablet  0   No current  facility-administered medications for this visit.    LABS/IMAGING: No results found for this or any previous visit (from the past 48 hour(s)). No results found.  VITALS: BP 154/90  Pulse 58  Ht 5\' 7"  (1.702 m)  Wt 148 lb (67.132 kg)  BMI 23.17 kg/m2  EXAM: General appearance: alert and no distress Neck: no adenopathy, no carotid bruit, no JVD, supple, symmetrical, trachea midline and thyroid not enlarged, symmetric, no tenderness/mass/nodules Lungs: clear to auscultation bilaterally Heart: regular rate and rhythm, S1, S2 normal, no murmur, click, rub or gallop Abdomen: soft, non-tender; bowel sounds normal; no masses,  no organomegaly Extremities: extremities normal, atraumatic, no cyanosis or edema Pulses: 2+ and symmetric Skin: Skin color, texture, turgor normal. No rashes or lesions Neurologic: Grossly normal  EKG: Normal sinus rhythm at 59 with deep T-wave inversions in V2 through V5, consistent with apical hypertrophic cardiomyopathy  ASSESSMENT: 1. Apical variant hypertrophic retinopathy 2. Chest pain secondary to HCM 3. Hypertension 4. Venous insufficiency 5. Right inguinal pain with a hernia, status post surgical repair 6. Right hip pain with degenerative hip disease  PLAN: 1.   Lauren House continues to do fairly well low-dose Ranexa and beta blocker. She is apical hypertrophic cardiomyopathy which is stable and low risk for a surgical procedure.  Will plan to continue her current medical regimen and see her back in 6 months.  Lauren Nose, MD, [facility] Attending Cardiologist [facility]  Lauren House 08/15/2012, 10:20 AM

## 2012-08-15 NOTE — Patient Instructions (Addendum)
Your physician recommends that you schedule a follow-up appointment in: 6 months  Continue same medications

## 2012-08-19 ENCOUNTER — Encounter: Payer: Self-pay | Admitting: Internal Medicine

## 2012-09-08 ENCOUNTER — Ambulatory Visit: Payer: Medicare Other | Admitting: Internal Medicine

## 2012-11-27 ENCOUNTER — Ambulatory Visit (INDEPENDENT_AMBULATORY_CARE_PROVIDER_SITE_OTHER): Payer: Medicare Other | Admitting: Family Medicine

## 2012-11-27 DIAGNOSIS — Z23 Encounter for immunization: Secondary | ICD-10-CM

## 2012-12-16 ENCOUNTER — Ambulatory Visit (HOSPITAL_COMMUNITY)
Admission: RE | Admit: 2012-12-16 | Discharge: 2012-12-16 | Disposition: A | Discharge status: Home / Self Care | Payer: Medicare Other | Source: Ambulatory Visit | Attending: Orthopedic Surgery | Admitting: Orthopedic Surgery

## 2012-12-16 DIAGNOSIS — M7072 Other bursitis of hip, left hip: Secondary | ICD-10-CM | POA: Insufficient documentation

## 2012-12-16 DIAGNOSIS — M25559 Pain in unspecified hip: Secondary | ICD-10-CM | POA: Insufficient documentation

## 2012-12-16 DIAGNOSIS — I10 Essential (primary) hypertension: Secondary | ICD-10-CM | POA: Insufficient documentation

## 2012-12-16 DIAGNOSIS — M160 Bilateral primary osteoarthritis of hip: Secondary | ICD-10-CM | POA: Insufficient documentation

## 2012-12-16 DIAGNOSIS — M25552 Pain in left hip: Secondary | ICD-10-CM | POA: Insufficient documentation

## 2012-12-16 DIAGNOSIS — IMO0001 Reserved for inherently not codable concepts without codable children: Secondary | ICD-10-CM | POA: Insufficient documentation

## 2012-12-16 DIAGNOSIS — R269 Unspecified abnormalities of gait and mobility: Secondary | ICD-10-CM | POA: Insufficient documentation

## 2012-12-16 NOTE — Evaluation (Signed)
Physical Therapy Evaluation  Patient Details  Name: Lauren House MRN: [id number] Date of Birth: 1935-12-26  Today's Date: 12/16/2012 Time: 4098-1191 PT Time Calculation (min): 30 min Charges: 1 evaluation             Visit#: 1 of 8  Re-eval: 01/15/13 Assessment Diagnosis: Lt hip pain Next MD Visit: Dr. Despina Hick - 2 minths  Authorization: BCBS Medicare    Authorization Time Period:    Authorization Visit#: 1 of 10   Past Medical History:  Past Medical History  Diagnosis Date  . Hypertension   . Osteoporosis   . Arthritis   . Hyperlipidemia     diet controlled.cannot take meds  . Diastolic heart failure secondary to coronary artery disease   . Anginal pain     comes and goes   . CHF (congestive heart failure)   . Anxiety   . Shortness of breath     with exertion  . History of kidney stones   . Vertigo   . Cancer     basal cell on abd. removed  . Nocturia   . Diabetes mellitus     borderline    Past Surgical History:  Past Surgical History  Procedure Laterality Date  . Abdominal hysterectomy    . Hemorrhoid surgery    . Hernia repair    . Eye surgery      cataracts bilateral  . Cardiac catheterization  2011  . Inguinal hernia repair  10/11/2011    Procedure: HERNIA REPAIR INGUINAL ADULT;  Surgeon: Almond Lint, MD;  Location: [facility] OR;  Service: General;  Laterality: Right;    Subjective Symptoms/Limitations Symptoms: Pt is a 77 year old female referred to PT for Lt hip pain which started bothering her about 1 month ago when she was traveling.  She reports that her Lt hip has started to feel better after recieving an injection on 10/15.  She has been able to go out and rake her leaves without increased pain. She reports that 3 years ago she feel on her Rt hip and it was found that she had an ingunial hernia which was repaired.  After that she was still having pain in her Rt hip and started to have injections to that hip which resolved the pain.  Currently her pain is  mostly when she is sleeping on it. She is currently icing her hip as recommended by MD. Pertinent History: Angina, Vertigo, Osteoporosis, incontience (w/bladder tact) Patient Stated Goals: decreased pain and improve strength Pain Assessment Currently in Pain?: No/denies Pain Score: 3  (laying on her Right side) Pain Location: Hip Pain Orientation: Left Pain Type: Acute pain Pain Onset: More than a month ago  Balance Screening Balance Screen Has the patient fallen in the past 6 months: No Has the patient had a decrease in activity level because of a fear of falling? : Yes (because of her heart) Is the patient reluctant to leave their home because of a fear of falling? : No  Prior Functioning  Prior Function Comments: Enjoys working around her home, in her yard, crafts, cooking  Sensation/Coordination/Flexibility/Functional Tests Coordination Gross Motor Movements are Fluid and Coordinated: No Coordination and Movement Description: impaired to trasnverse abdominus muscluature Flexibility Thomas: Positive Obers: Positive 90/90: Positive (LLE: 55 degrees) Functional Tests Functional Tests: FOTO: Status: 59%, Limiation: 41%  RLE Strength Right Hip Flexion: 3+/5 Right Hip Extension: 3+/5 Right Hip ABduction: 3/5 Right Hip ADduction: 4/5 Right Knee Flexion: 5/5 Right Knee Extension: 5/5 LLE Strength  Left Hip Flexion: 3/5 Left Hip Extension: 3+/5 Left Hip ABduction: 3/5 (pain) Left Hip ADduction: 3+/5 Left Knee Flexion: 4/5 Left Knee Extension: 4/5 Left Ankle Dorsiflexion: 4/5 Palpation Palpation: unable to palpate pain and tenderness to Lt hip region  Mobility/Balance  Ambulation/Gait Ambulation/Gait: Yes Gait Pattern: Antalgic;Lateral hip instability Posture/Postural Control Posture/Postural Control: No significant limitations Static Standing Balance Static Standing - Comment/# of Minutes: impaired hip straegty Single Leg Stance - Right Leg: 5 Single Leg Stance -  Left Leg: 5 Tandem Stance - Right Leg: 5 Tandem Stance - Left Leg: 5 Rhomberg - Eyes Opened: 10 Rhomberg - Eyes Closed: 10   Exercise/Treatments Stretches Active Hamstring Stretch: 1 rep;30 seconds Quad Stretch: 30 seconds;1 rep Standing Heel Raises: 10 reps Functional Squat: 10 reps Supine Bridges: 5 reps Straight Leg Raises: Left;5 reps Sidelying Hip ABduction: Left;5 reps Prone  Hip Extension: Both;5 reps   Physical Therapy Assessment and Plan PT Assessment and Plan Clinical Impression Statement: Pt is a 77 year old female referred to PT secondary to Lt hip bursitis with impairments listed below.  At this time pt is most limited by her impaired flexibility and decreased strength to her LLE and core musculature.  Pt will benefit from skilled therapeutic intervention in order to improve on the following deficits: Decreased strength;Decreased balance;Decreased coordination;Pain;Impaired perceived functional ability;Abnormal gait;Impaired flexibility Rehab Potential: Good PT Frequency: Min 2X/week PT Duration: 4 weeks PT Plan: Add heel squezzes, side lunges, hip flexor stretching, ITB stretch, standing HS curl.  Progress to stair training and gluteus medius training.  Progress with balance activities by 4th visit.     Goals Home Exercise Program Pt/caregiver will Perform Home Exercise Program: Independently PT Goal: Perform Home Exercise Program - Progress: Met PT Short Term Goals Time to Complete Short Term Goals: 2 weeks PT Short Term Goal 1: Pt will improve LLE flexibility to [facility] in order to decrease pain to left hip to a 1/10 when sleeping on her left side.  PT Short Term Goal 2: Pt will improve her LLE strength by 1/2 muscle grade for improved gait mechanics.  PT Short Term Goal 3: Pt will improve her proriocetive awareness and demonstrate Lt and Rt SLS x15 seconds on solid surface to decrease risk of falls.  PT Long Term Goals Time to Complete Long Term Goals: 4 weeks PT  Long Term Goal 1: Pt will be educated in an adavanced HEP to address balance and core strength.` PT Long Term Goal 2: Pt will be independent with core coordination and strength for improved core stability to decrease risk of falls.  Long Term Goal 3: Pt will improve her FOTO to Status greater than 64% and Limitation less than 36% for improved percieved functional ability.   Problem List Patient Active Problem List   Diagnosis Date Noted  . Left hip pain 12/16/2012  . Bursitis of left hip 12/16/2012  . Osteoarthritis, hip, bilateral 12/16/2012  . Chest pain 08/15/2012  . Apical variant hypertrophic cardiomyopathy 08/15/2012  . Bilateral inguinal hernia, right symptomatic 09/21/2011  . COMEDO 10/18/2008  . POSTMENOPAUSAL OSTEOPOROSIS 09/01/2008  . HIP PAIN, LEFT 01/19/2008  . CARCINOMA, BASAL CELL, SKIN 10/02/2006  . VARICOSE VEIN 09/24/2006  . ACTINIC KERATOSIS 09/24/2006  . NECK PAIN 09/24/2006  . BACK PAIN 09/24/2006  . NEOP, BNG, SCALP/SKIN, NECK 04/16/2006  . FIBROIDS, UTERUS 04/16/2006  . HYPERLIPIDEMIA 04/16/2006  . MACULAR DEGENERATION 04/16/2006  . HYPERTENSION 04/16/2006  . ALLERGIC RHINITIS 04/16/2006  . OVERACTIVE BLADDER 04/16/2006  . URINARY  INCONTINENCE 04/16/2006  . SKIN CANCER, HX OF 04/16/2006  . VERTIGO, HX OF 04/16/2006  . RENAL CALCULUS, HX OF 04/16/2006    PT - End of Session Activity Tolerance: Patient tolerated treatment well PT Plan of Care PT Home Exercise Plan: given PT Patient Instructions: importance of HEP, core strength. Consulted and Agree with Plan of Care: Patient  GP Functional Limitation: Mobility: Walking and moving around Mobility: Walking and Moving Around Current Status [phone]): At least 40 percent but less than 60 percent impaired, limited or restricted Mobility: Walking and Moving Around Goal Status [phone]): At least 20 percent but less than 40 percent impaired, limited or restricted  Lianne Carreto, MPT, ATC 12/16/2012, 4:02  PM  Physician Documentation Your signature is required to indicate approval of the treatment plan as stated above.  Please sign and either send electronically or make a copy of this report for your files and return this physician signed original.   Please mark one 1.__approve of plan  2. ___approve of plan with the following conditions.   ______________________________                                                          _____________________ Physician Signature                                                                                                             Date

## 2012-12-17 ENCOUNTER — Ambulatory Visit (HOSPITAL_COMMUNITY)
Admission: RE | Admit: 2012-12-17 | Discharge: 2012-12-17 | Disposition: A | Discharge status: Home / Self Care | Payer: Medicare Other | Source: Ambulatory Visit | Attending: Family Medicine | Admitting: Family Medicine

## 2012-12-17 DIAGNOSIS — M7072 Other bursitis of hip, left hip: Secondary | ICD-10-CM

## 2012-12-17 DIAGNOSIS — M25552 Pain in left hip: Secondary | ICD-10-CM

## 2012-12-17 DIAGNOSIS — M160 Bilateral primary osteoarthritis of hip: Secondary | ICD-10-CM

## 2012-12-17 NOTE — Progress Notes (Signed)
Physical Therapy Treatment Patient Details  Name: Lauren House MRN: [id number] Date of Birth: 03-11-1935  Today's Date: 12/17/2012 Time: 4098-1191 PT Time Calculation (min): 42 min Charge: [id number]  Visit#: 2 of 8  Re-eval: 01/15/13 Assessment Diagnosis: Lt hip pain Next MD Visit: Dr. Despina Hick - 2 minths  Authorization: BC[employer] Medicare  Authorization Time Period:    Authorization Visit#: 2 of 10   Subjective: Symptoms/Limitations Symptoms: Pt stated she was sore following treatment yesterday, pain scale 2/10.  Reports compliance with HEP without questions. Pain Assessment Currently in Pain?: Yes Pain Score: 2  Pain Location: Hip Pain Orientation: Left  Precautions/Restrictions     Exercise/Treatments Mobility/Balance        Stretches Quad Stretch: 2 reps;30 seconds Hip Flexor Stretch: 2 reps;30 seconds Gastroc Stretch: 2 reps;30 seconds;Limitations Gastroc Stretch Limitations: slant bpard Standing Heel Raises: 10 reps;Limitations Heel Raises Limitations: Toe raises 10 reps Knee Flexion: 10 reps Side Lunges: Both;10 reps;3 seconds Functional Squat: 10 reps Supine Bridges: 10 reps Straight Leg Raises: Left;10 reps Sidelying Hip ABduction: Both;10 reps Prone  Hip Extension: Both;10 reps Other Prone Exercises: heel squeeze 10x 5"      Physical Therapy Assessment and Plan PT Assessment and Plan Clinical Impression Statement: Began treatment for Lt hip strengthening and stretching, pt able to complete all exercises with good form following demonstration and verbal cueing for technique.  Pt c/o cramping to gastroc and hamstrings during exercises, stretching resolved cramps. PT Plan:  Progress to stair training and gluteus medius training.  Progress with balance activities by 4th visit.     Goals Home Exercise Program Pt/caregiver will Perform Home Exercise Program: Independently PT Short Term Goals Time to Complete Short Term Goals: 2 weeks PT Short  Term Goal 1: Pt will improve LLE flexibility to [facility] in order to decrease pain to left hip to a 1/10 when sleeping on her left side.  PT Short Term Goal 1 - Progress: Progressing toward goal PT Short Term Goal 2: Pt will improve her LLE strength by 1/2 muscle grade for improved gait mechanics.  PT Short Term Goal 2 - Progress: Progressing toward goal PT Short Term Goal 3: Pt will improve her proriocetive awareness and demonstrate Lt and Rt SLS x15 seconds on solid surface to decrease risk of falls.  PT Long Term Goals Time to Complete Long Term Goals: 4 weeks PT Long Term Goal 1: Pt will be educated in an adavanced HEP to address balance and core strength.` PT Long Term Goal 2: Pt will be independent with core coordination and strength for improved core stability to decrease risk of falls.  Long Term Goal 3: Pt will improve her FOTO to Status greater than 64% and Limitation less than 36% for improved percieved functional ability.   Problem List Patient Active Problem List   Diagnosis Date Noted  . Left hip pain 12/16/2012  . Bursitis of left hip 12/16/2012  . Osteoarthritis, hip, bilateral 12/16/2012  . Chest pain 08/15/2012  . Apical variant hypertrophic cardiomyopathy 08/15/2012  . Bilateral inguinal hernia, right symptomatic 09/21/2011  . COMEDO 10/18/2008  . POSTMENOPAUSAL OSTEOPOROSIS 09/01/2008  . HIP PAIN, LEFT 01/19/2008  . CARCINOMA, BASAL CELL, SKIN 10/02/2006  . VARICOSE VEIN 09/24/2006  . ACTINIC KERATOSIS 09/24/2006  . NECK PAIN 09/24/2006  . BACK PAIN 09/24/2006  . NEOP, BNG, SCALP/SKIN, NECK 04/16/2006  . FIBROIDS, UTERUS 04/16/2006  . HYPERLIPIDEMIA 04/16/2006  . MACULAR DEGENERATION 04/16/2006  . HYPERTENSION 04/16/2006  . ALLERGIC RHINITIS 04/16/2006  .  OVERACTIVE BLADDER 04/16/2006  . URINARY INCONTINENCE 04/16/2006  . SKIN CANCER, HX OF 04/16/2006  . VERTIGO, HX OF 04/16/2006  . RENAL CALCULUS, HX OF 04/16/2006    PT - End of Session Activity Tolerance:  Patient tolerated treatment well General Behavior During Therapy: [facility] for tasks assessed/performed  GP    Juel Burrow 12/17/2012, 8:44 AM

## 2012-12-23 ENCOUNTER — Encounter (HOSPITAL_COMMUNITY): Payer: Self-pay | Admitting: Emergency Medicine

## 2012-12-23 ENCOUNTER — Observation Stay (HOSPITAL_COMMUNITY)
Admission: EM | Admit: 2012-12-23 | Discharge: 2012-12-24 | Disposition: A | Discharge status: Home / Self Care | Payer: Medicare Other | Attending: Family Medicine | Admitting: Family Medicine

## 2012-12-23 ENCOUNTER — Emergency Department (HOSPITAL_COMMUNITY): Payer: Medicare Other

## 2012-12-23 ENCOUNTER — Inpatient Hospital Stay (HOSPITAL_COMMUNITY): Admission: RE | Admit: 2012-12-23 | Payer: Medicare Other | Source: Ambulatory Visit

## 2012-12-23 ENCOUNTER — Telehealth (HOSPITAL_COMMUNITY): Payer: Self-pay

## 2012-12-23 DIAGNOSIS — R079 Chest pain, unspecified: Principal | ICD-10-CM | POA: Diagnosis present

## 2012-12-23 DIAGNOSIS — D049 Carcinoma in situ of skin, unspecified: Secondary | ICD-10-CM

## 2012-12-23 DIAGNOSIS — M810 Age-related osteoporosis without current pathological fracture: Secondary | ICD-10-CM

## 2012-12-23 DIAGNOSIS — E785 Hyperlipidemia, unspecified: Secondary | ICD-10-CM | POA: Insufficient documentation

## 2012-12-23 DIAGNOSIS — M542 Cervicalgia: Secondary | ICD-10-CM

## 2012-12-23 DIAGNOSIS — Z23 Encounter for immunization: Secondary | ICD-10-CM | POA: Insufficient documentation

## 2012-12-23 DIAGNOSIS — I10 Essential (primary) hypertension: Secondary | ICD-10-CM | POA: Diagnosis present

## 2012-12-23 DIAGNOSIS — M25552 Pain in left hip: Secondary | ICD-10-CM

## 2012-12-23 DIAGNOSIS — K4020 Bilateral inguinal hernia, without obstruction or gangrene, not specified as recurrent: Secondary | ICD-10-CM

## 2012-12-23 DIAGNOSIS — L708 Other acne: Secondary | ICD-10-CM

## 2012-12-23 DIAGNOSIS — D259 Leiomyoma of uterus, unspecified: Secondary | ICD-10-CM

## 2012-12-23 DIAGNOSIS — E782 Mixed hyperlipidemia: Secondary | ICD-10-CM | POA: Diagnosis present

## 2012-12-23 DIAGNOSIS — M549 Dorsalgia, unspecified: Secondary | ICD-10-CM

## 2012-12-23 DIAGNOSIS — J309 Allergic rhinitis, unspecified: Secondary | ICD-10-CM

## 2012-12-23 DIAGNOSIS — M160 Bilateral primary osteoarthritis of hip: Secondary | ICD-10-CM

## 2012-12-23 DIAGNOSIS — M7072 Other bursitis of hip, left hip: Secondary | ICD-10-CM

## 2012-12-23 DIAGNOSIS — R32 Unspecified urinary incontinence: Secondary | ICD-10-CM

## 2012-12-23 DIAGNOSIS — I201 Angina pectoris with documented spasm: Secondary | ICD-10-CM

## 2012-12-23 DIAGNOSIS — I868 Varicose veins of other specified sites: Secondary | ICD-10-CM

## 2012-12-23 DIAGNOSIS — I428 Other cardiomyopathies: Secondary | ICD-10-CM

## 2012-12-23 DIAGNOSIS — Z8669 Personal history of other diseases of the nervous system and sense organs: Secondary | ICD-10-CM

## 2012-12-23 DIAGNOSIS — N318 Other neuromuscular dysfunction of bladder: Secondary | ICD-10-CM

## 2012-12-23 DIAGNOSIS — D234 Other benign neoplasm of skin of scalp and neck: Secondary | ICD-10-CM

## 2012-12-23 DIAGNOSIS — H3530 Unspecified macular degeneration: Secondary | ICD-10-CM

## 2012-12-23 DIAGNOSIS — I209 Angina pectoris, unspecified: Secondary | ICD-10-CM | POA: Insufficient documentation

## 2012-12-23 DIAGNOSIS — L570 Actinic keratosis: Secondary | ICD-10-CM

## 2012-12-23 DIAGNOSIS — I422 Other hypertrophic cardiomyopathy: Secondary | ICD-10-CM | POA: Diagnosis present

## 2012-12-23 DIAGNOSIS — R0602 Shortness of breath: Secondary | ICD-10-CM | POA: Insufficient documentation

## 2012-12-23 DIAGNOSIS — Z87442 Personal history of urinary calculi: Secondary | ICD-10-CM

## 2012-12-23 DIAGNOSIS — Z85828 Personal history of other malignant neoplasm of skin: Secondary | ICD-10-CM

## 2012-12-23 HISTORY — DX: Prediabetes: R73.03

## 2012-12-23 HISTORY — DX: Other hypertrophic cardiomyopathy: I42.2

## 2012-12-23 HISTORY — DX: Essential (primary) hypertension: I10

## 2012-12-23 HISTORY — DX: Personal history of other malignant neoplasm of skin: Z85.828

## 2012-12-23 HISTORY — DX: Atherosclerotic heart disease of native coronary artery without angina pectoris: I25.10

## 2012-12-23 LAB — CBC WITH DIFFERENTIAL/PLATELET
Basophils Absolute: 0 10*3/uL (ref 0.0–0.1)
Eosinophils Absolute: 0.2 10*3/uL (ref 0.0–0.7)
Eosinophils Relative: 2 % (ref 0–5)
Lymphs Abs: 3.1 10*3/uL (ref 0.7–4.0)
MCH: 29.4 pg (ref 26.0–34.0)
MCV: 89.7 fL (ref 78.0–100.0)
Neutro Abs: 6.2 10*3/uL (ref 1.7–7.7)
Platelets: 241 10*3/uL (ref 150–400)
RDW: 13.2 % (ref 11.5–15.5)
WBC: 10.3 10*3/uL (ref 4.0–10.5)

## 2012-12-23 LAB — COMPREHENSIVE METABOLIC PANEL
ALT: 13 U/L (ref 0–35)
AST: 16 U/L (ref 0–37)
BUN: 21 mg/dL (ref 6–23)
Calcium: 9.7 mg/dL (ref 8.4–10.5)
GFR calc Af Amer: 63 mL/min — ABNORMAL LOW (ref >90)
GFR calc non Af Amer: 54 mL/min — ABNORMAL LOW (ref >90)
Sodium: 138 mEq/L (ref 135–145)
Total Protein: 7.2 g/dL (ref 6.0–8.3)

## 2012-12-23 LAB — LIPID PANEL
Cholesterol: 287 mg/dL — ABNORMAL HIGH (ref 0–200)
HDL: 47 mg/dL (ref >39)
Triglycerides: 368 mg/dL — ABNORMAL HIGH (ref <150)

## 2012-12-23 LAB — PRO B NATRIURETIC PEPTIDE: Pro B Natriuretic peptide (BNP): 574.7 pg/mL — ABNORMAL HIGH (ref 0–450)

## 2012-12-23 LAB — D-DIMER, QUANTITATIVE: D-Dimer, Quant: 1.01 ug/mL-FEU — ABNORMAL HIGH (ref 0.00–0.48)

## 2012-12-23 LAB — TROPONIN I
Troponin I: <0.3 ng/mL (ref <0.30)
Troponin I: <0.3 ng/mL (ref <0.30)

## 2012-12-23 MED ORDER — METOPROLOL TARTRATE 25 MG PO TABS
12.5000 mg | ORAL_TABLET | Freq: Two times a day (BID) | ORAL | Status: DC
  Administered 2012-12-23 – 2012-12-24 (×2): 12.5 mg via ORAL
  Filled 2012-12-23 (×2): qty 1

## 2012-12-23 MED ORDER — NITROGLYCERIN 0.4 MG SL SUBL
0.4000 mg | SUBLINGUAL_TABLET | SUBLINGUAL | Status: DC | PRN
Start: 2012-12-23 — End: 2012-12-23
  Administered 2012-12-23 (×2): 0.4 mg via SUBLINGUAL

## 2012-12-23 MED ORDER — SODIUM CHLORIDE 0.9 % IJ SOLN
3.0000 mL | Freq: Two times a day (BID) | INTRAMUSCULAR | Status: DC
  Administered 2012-12-23: 3 mL via INTRAVENOUS

## 2012-12-23 MED ORDER — ASPIRIN 81 MG PO CHEW
243.0000 mg | CHEWABLE_TABLET | Freq: Once | ORAL | Status: AC
Start: 2012-12-23 — End: 2012-12-23
  Administered 2012-12-23: 243 mg via ORAL
  Filled 2012-12-23: qty 3

## 2012-12-23 MED ORDER — HYDROCHLOROTHIAZIDE 12.5 MG PO CAPS
12.5000 mg | ORAL_CAPSULE | Freq: Every day | ORAL | Status: DC
Start: 2012-12-23 — End: 2012-12-24
  Administered 2012-12-24: 12.5 mg via ORAL
  Filled 2012-12-23: qty 1

## 2012-12-23 MED ORDER — ASPIRIN EC 81 MG PO TBEC
162.0000 mg | DELAYED_RELEASE_TABLET | Freq: Every day | ORAL | Status: DC
  Administered 2012-12-24: 162 mg via ORAL
  Filled 2012-12-23: qty 2

## 2012-12-23 MED ORDER — SODIUM CHLORIDE 0.9 % IV SOLN: 250.0000 mL | INTRAVENOUS | Status: DC | PRN

## 2012-12-23 MED ORDER — TRAZODONE HCL 50 MG PO TABS
25.0000 mg | ORAL_TABLET | Freq: Every evening | ORAL | Status: DC | PRN
  Administered 2012-12-23: 25 mg via ORAL
  Filled 2012-12-23: qty 1

## 2012-12-23 MED ORDER — ONDANSETRON HCL 4 MG/2ML IJ SOLN
4.0000 mg | Freq: Once | INTRAMUSCULAR | Status: AC
  Administered 2012-12-23: 4 mg via INTRAVENOUS
  Filled 2012-12-23: qty 2

## 2012-12-23 MED ORDER — PNEUMOCOCCAL VAC POLYVALENT 25 MCG/0.5ML IJ INJ
0.5000 mL | INJECTION | Freq: Once | INTRAMUSCULAR | Status: AC
  Administered 2012-12-23: 0.5 mL via INTRAMUSCULAR
  Filled 2012-12-23: qty 0.5

## 2012-12-23 MED ORDER — ACETAMINOPHEN 325 MG PO TABS
650.0000 mg | ORAL_TABLET | Freq: Once | ORAL | Status: AC
  Administered 2012-12-23: 650 mg via ORAL
  Filled 2012-12-23: qty 2

## 2012-12-23 MED ORDER — NITROGLYCERIN 0.4 MG SL SUBL: 0.4000 mg | SUBLINGUAL_TABLET | SUBLINGUAL | Status: DC | PRN

## 2012-12-23 MED ORDER — ONDANSETRON HCL 4 MG/2ML IJ SOLN: 4.0000 mg | Freq: Four times a day (QID) | INTRAMUSCULAR | Status: DC | PRN

## 2012-12-23 MED ORDER — ENOXAPARIN SODIUM 40 MG/0.4ML ~~LOC~~ SOLN
40.0000 mg | SUBCUTANEOUS | Status: DC
  Administered 2012-12-23: 40 mg via SUBCUTANEOUS
  Filled 2012-12-23: qty 0.4

## 2012-12-23 MED ORDER — IBUPROFEN 800 MG PO TABS: 400.0000 mg | ORAL_TABLET | Freq: Three times a day (TID) | ORAL | Status: DC | PRN

## 2012-12-23 MED ORDER — LEVOFLOXACIN IN D5W 750 MG/150ML IV SOLN
750.0000 mg | Freq: Once | INTRAVENOUS | Status: AC
  Administered 2012-12-23: 750 mg via INTRAVENOUS
  Filled 2012-12-23: qty 150

## 2012-12-23 MED ORDER — ACETAMINOPHEN 650 MG RE SUPP: 650.0000 mg | Freq: Four times a day (QID) | RECTAL | Status: DC | PRN

## 2012-12-23 MED ORDER — ONDANSETRON HCL 4 MG PO TABS: 4.0000 mg | ORAL_TABLET | Freq: Four times a day (QID) | ORAL | Status: DC | PRN

## 2012-12-23 MED ORDER — ACETAMINOPHEN 325 MG PO TABS: 650.0000 mg | ORAL_TABLET | Freq: Four times a day (QID) | ORAL | Status: DC | PRN

## 2012-12-23 MED ORDER — ASPIRIN 81 MG PO CHEW: 324.0000 mg | CHEWABLE_TABLET | Freq: Once | ORAL | Status: DC

## 2012-12-23 MED ORDER — SODIUM CHLORIDE 0.9 % IJ SOLN: 3.0000 mL | INTRAMUSCULAR | Status: DC | PRN

## 2012-12-23 MED ORDER — MORPHINE SULFATE 2 MG/ML IJ SOLN: 1.0000 mg | INTRAMUSCULAR | Status: DC | PRN

## 2012-12-23 MED ORDER — SODIUM CHLORIDE 0.9 % IV SOLN
INTRAVENOUS | Status: DC
  Administered 2012-12-23: 1000 mL via INTRAVENOUS

## 2012-12-23 MED ORDER — IOHEXOL 350 MG/ML SOLN
100.0000 mL | Freq: Once | INTRAVENOUS | Status: AC | PRN
  Administered 2012-12-23: 100 mL via INTRAVENOUS

## 2012-12-23 MED ORDER — RANOLAZINE ER 500 MG PO TB12
500.0000 mg | ORAL_TABLET | Freq: Every day | ORAL | Status: DC
  Filled 2012-12-23 (×2): qty 1

## 2012-12-23 NOTE — H&P (Signed)
[facility] History and Physical  Lauren House ZOX:[id number] DOB: 1935/05/03 DOA: 12/23/2012 Referring physician:  PCP: Leo Grosser, MD  Specialists:   Chief Complaint: Chest pain  HPI: Lauren House is a very pleasant 77 y.o. female with a past medical history that includes hypertension, hyperlipidemia, chronic diastolic heart failure secondary to coronary artery disease, anxiety, anginal pain, apical. Hypertrophic cardiomyopathy presents to the emergency department with chief complaint of chest pain. Information is obtained from the patient. She states that yesterday she "did not feel well all day". She denies any specific pain/discomfort she denies chest pain nausea vomiting at that time. She states that she just felt like she had no energy. She denies fever chills or recent sick contacts. She states she slept her normal last night and when she awakened this morning she "noticed" that she had a pressure feeling on right side of her chest. She rates this pain a 10 out of 10 at its most severe. She states that radiates to both shoulders at times. Associated symptoms include diaphoresis and nausea without vomiting. She denies any worsening shortness of breath is her baseline is slightly short of breath. She states at this time she took an 81 mg aspirin and a nitroglycerin glycerin tablet with little relief. He indicates that the pain did not subside so she came to the emergency department. In the emergency department she was given another aspirin and another nitroglycerin tablet and at the time of my exam she is pain-free. Of note patient indicates that she had a cardiac cath 3 years ago. Chart review indicates the cath yielded 60% LAD narrowing. Patient's angina since that time was felt to be microvascular in nature. Initial troponin in the emergency department negative. Chest x-ray with findings concerning for developing bronchopneumonia in the right upper lobe with pulmonary venous  congestion without frank pulmonary edema. CT anterior chest was done today yields no acute pulmonary embolism and no acute pulmonary parenchymal process. EKG yields normal sinus rhythm with left ventricular hypertrophy. No acute changes from EKG done in August of 2013. Vital signs in the emergency department are unremarkable. We are asked to admit for further evaluation and treatment   Review of Systems: 10 point review of systems complete and all systems are negative except as indicated in the history of present illness  Past Medical History  Diagnosis Date  . Hypertension   . Osteoporosis   . Arthritis   . Hyperlipidemia     diet controlled.cannot take meds  . Diastolic heart failure secondary to coronary artery disease   . Anginal pain     comes and goes   . CHF (congestive heart failure)   . Anxiety   . Shortness of breath     with exertion  . History of kidney stones   . Vertigo   . Cancer     basal cell on abd. removed  . Nocturia   . Diabetes mellitus     borderline    Past Surgical History  Procedure Laterality Date  . Abdominal hysterectomy    . Hemorrhoid surgery    . Hernia repair    . Eye surgery      cataracts bilateral  . Cardiac catheterization  2011  . Inguinal hernia repair  10/11/2011    Procedure: HERNIA REPAIR INGUINAL ADULT;  Surgeon: Almond Lint, MD;  Location: [facility] OR;  Service: General;  Laterality: Right;   Social History:  reports that she has never smoked. She has never used smokeless  tobacco. She reports that she does not drink alcohol or use illicit drugs. She is married and lives with her husband. She is independent with ADLs  Allergies  Allergen Reactions  . Chlorhexidine Itching  . Aspirin     REACTION: nose bleeds  . Atorvastatin     REACTION: muscle pain  . Ciprofloxacin     REACTION: nausea  . Clarithromycin   . Colestipol Hcl     REACTION: hallucinations  . Ezetimibe     REACTION: GI upset  . Penicillins     REACTION: rash  .  Simvastatin     REACTION: muscle spasms  . Tape Rash    Family History  Problem Relation Age of Onset  . Heart disease Father   . Cancer Sister     lung  . Cancer Brother     lung    Prior to Admission medications   Medication Sig Start Date End Date Taking? Authorizing Provider  aspirin 81 MG tablet Take 81 mg by mouth daily.   Yes Historical Provider, MD  Cyanocobalamin (VITAMIN B12 PO) Take 1 tablet by mouth daily.   Yes Historical Provider, MD  ibuprofen (ADVIL,MOTRIN) 200 MG tablet Take 400 mg by mouth every 8 (eight) hours as needed for pain.   Yes Historical Provider, MD  metoprolol tartrate (LOPRESSOR) 25 MG tablet Take 12.5 mg by mouth 2 (two) times daily.   Yes Historical Provider, MD  Multiple Vitamins-Minerals (OCUVITE PO) Take 1 tablet by mouth daily.   Yes Historical Provider, MD  ranolazine (RANEXA) 500 MG 12 hr tablet Take 1 tablet (500 mg total) by mouth daily. 08/15/12  Yes Chrystie Nose, MD  fluticasone [facility]) 50 MCG/ACT nasal spray USE TWO SPRAY IN EACH NOSTRIL EVERY DAY 07/14/12   Donita Brooks, MD  hydrochlorothiazide (,MICROZIDE/HYDRODIURIL,) 12.5 MG capsule Take 12.5 mg by mouth daily.      Historical Provider, MD   Physical Exam: Filed Vitals:   12/23/12 1004  BP: 118/60  Pulse: 59  Temp:   Resp: 22     General:  Slightly pale but in no acute distress  Eyes: PERRLA, EOMI, no scleral icterus  ENT: Ears clear nose without drainage mucous membranes of her mouth are slightly dry slightly pale  Neck: Supple no JVD full range of motion no lymphadenopathy  Cardiovascular: Heart sounds slightly distant but regular. No murmur or gallop noted. No lower extremity edema  Respiratory: Normal effort. breath sounds clear bilaterally. I hear no crackles or wheezes.  Abdomen: Soft positive bowel sounds nontender to palpation no mass organomegaly no  Skin: Warm and dry. Slightly pale. I appreciate no rashes or lesions  Musculoskeletal: No clubbing or  cyanosis. Joints without swelling/erythema  Psychiatric: Calm cooperative  Neurologic: Radial nerves II through XII grossly intact her speech is clear facial symmetry  Labs on Admission:  Basic Metabolic Panel:  Recent Labs Lab 12/23/12 0815  NA 138  K 3.7  CL 101  CO2 27  GLUCOSE 128*  BUN 21  CREATININE 0.99  CALCIUM 9.7   Liver Function Tests:  Recent Labs Lab 12/23/12 0815  AST 16  ALT 13  ALKPHOS 64  BILITOT 0.2*  PROT 7.2  ALBUMIN 3.6   No results found for this basename: LIPASE, AMYLASE,  in the last 168 hours No results found for this basename: AMMONIA,  in the last 168 hours CBC:  Recent Labs Lab 12/23/12 0815  WBC 10.3  NEUTROABS 6.2  HGB 14.6  HCT 44.6  MCV 89.7  PLT 241   Cardiac Enzymes:  Recent Labs Lab 12/23/12 0815  TROPONINI <0.30    BNP (last 3 results)  Recent Labs  12/23/12 0815  PROBNP 574.7*   CBG: No results found for this basename: GLUCAP,  in the last 168 hours  Radiological Exams on Admission: Ct Angio Chest Pe W/cm &/or Wo Cm  12/23/2012   CLINICAL DATA:  Chest pain that radiates to the arms. Short of breath and dizziness.  EXAM: CT ANGIOGRAPHY CHEST WITH CONTRAST  TECHNIQUE: Multidetector CT imaging of the chest was performed using the standard protocol during bolus administration of intravenous contrast. Multiplanar CT image reconstructions including MIPs were obtained to evaluate the vascular anatomy.  CONTRAST:  OMNIPAQUE IOHEXOL 350 MG/ML SOLN  COMPARISON:  Chest radiograph 12/23/2012  FINDINGS: There are no filling defects within the pulmonary arteries to suggest acute pulmonary embolism. No acute findings of the aorta or great vessels. No pericardial fluid. Esophagus is normal.  No axillary or supraclavicular lymphadenopathy. No mediastinal hilar lymphadenopathy.  Review of the lung parenchyma demonstrates no pneumothorax or pleural fluid. No airspace disease. Small subpleural nodule along the superior oblique  fissure measuring 5 mm (image 23) and appears benign.  Review of the MIP images confirms the above findings.  IMPRESSION: 1. No acute pulmonary embolism. 2. No acute pulmonary parenchymal process.   Electronically Signed   By: Genevive Bi M.D.   On: 12/23/2012 10:07   Dg Chest Portable 1 View  12/23/2012   CLINICAL DATA:  Sharp chest pain today. General feeling of fatigue. Cough and congestion.  EXAM: PORTABLE CHEST - 1 VIEW  COMPARISON:  CHEST x-ray 10/08/2011.  FINDINGS: Mild cephalization of the pulmonary vasculature, without frank pulmonary edema. There are some new patchy opacities in the right upper lobe, with an appearance suggestive of developing bronchopneumonia. Lungs otherwise appear clear. No pleural effusions. Heart size is within normal limits. Upper mediastinal contours are unremarkable.  IMPRESSION: 1. Findings concerning for developing bronchopneumonia in the right upper lobe. Repeat chest x-ray in 2-3 weeks following trial of antimicrobial therapy is recommended to ensure resolution of these findings (i.e., to exclude a centrally obstructing lesion). 2. Pulmonary venous congestion, without frank pulmonary edema.   Electronically Signed   By: Trudie Reed M.D.   On: 12/23/2012 08:29    EKG: Independently reviewed normal sinus rhythm with left ventricular hypertrophy. No acute changes since August of 2013  Assessment/Plan Principal Problem:   Chest pain: Atypical. At rest. Patient with history of anginal pain. Admit to telemetry to rule out. First troponin negative. We'll cycle troponins and repeat EKG in the a.m. last echo done in 2011 so we will get one today. will provide nitroglycerin and morphine for chest pain. Oxygen as needed for support. Patient reports an allergy to full dose aspirin we'll continue low-dose aspirin and her statin. Check a lipid panel. If cleared may need outpatient stress test. Will make arrangements at discharge  Active Problems:   HYPERLIPIDEMIA:  Continue her statin. Check a lipid panel    HYPERTENSION controlled. Systolic blood pressure range 846- 150. Continue home Lopressor, hydrochlorothiazide.    Apical variant hypertrophic cardiomyopathy: Chart review indicates last cardiology visit June 2014. At that time he made no changes in her medical management that included Ranexa and beta block. Will continue  OF note, ED RN reports that pharmacy stated patient confused about how to take medications. Pharmacy stated they will document findings of home med rec. Await information at  time of admission.    Code Status: full Family Communication: husband Disposition Plan: home when ready  Time spent: 21 minutes  Gwenyth Bender [facility] Pager [phone]  If 7PM-7AM, please contact night-coverage [email or web address] Password [facility] 12/23/2012, 11:33 AM

## 2012-12-23 NOTE — ED Notes (Signed)
Patient c/o central chest pain that radiates into arms bilaterally. Per patient woke this morning with the pain at 4:30. Patient reports SOB, dizziness, and nausea. Patient states "I always have shortness of breath and dizziness though." Patient unsure if SOB or dizziness is worse today. Patient reports taking SL nitro and a 81mg  aspirin at 6:30 this morning with some relief. Patient denies hx of MIs or cardiac stents.

## 2012-12-23 NOTE — H&P (Signed)
Patient seen and examined. Agree with not as above per Clydie Braun black.  Patient does have history of coronary artery disease. She is admitted with chest pain. Pain does have typical and atypical features. We'll request a cardiology consultation to see if he needs any further ischemic workup. Anticipate discharge tomorrow if no further cardiology workup is necessary. Please refer to note as above for further inpatient workup.  MEMON,JEHANZEB

## 2012-12-23 NOTE — ED Provider Notes (Signed)
CSN: [id number]     Arrival date & time 12/23/12  4098 History   This chart was scribed for Shelda Jakes, MD, by Yevette Edwards, ED Scribe. This patient was seen in room APA14/APA14 and the patient's care was started at 8:20 AM.  First MD Initiated Contact with Patient 12/23/12 0750     Chief Complaint  Patient presents with  . Chest Pain    Patient is a 77 y.o. female presenting with chest pain. The history is provided by the patient. No language interpreter was used.  Chest Pain Pain location:  R chest and R lateral chest Pain quality: pressure   Pain radiates to:  Does not radiate Pain radiates to the back: no   Pain severity:  Moderate Onset quality:  Gradual Duration:  4 hours Timing:  Constant Progression:  Improving Chronicity:  Recurrent Ineffective treatments:  Aspirin and nitroglycerin Associated symptoms: cough, fatigue, headache (Mild), nausea and shortness of breath   Associated symptoms: no abdominal pain, no back pain, no fever and not vomiting   Risk factors: coronary artery disease, diabetes mellitus (Borderline) and hypertension    HPI Comments: Lauren House is a 77 y.o. female, with a h/o angina and CHF, who presents to the Emergency Department complaining of constant right-sided chest pain which began yesterday but increased in severity four hours ago when she awakened. The pt characterizes the chest pain as "pressure," and she rates it as 10/10 at its most severe; she states that currently the chest pain is 8/10. The pt took a nitro and one baby aspirin this morning, but without resolution. Nothing worsens the pain. Yesterday, the pt experienced fatigue and a subjective fever. She has experienced nausea and SOB increased from her baseline. The pt denies emesis. The pt had a cardiac cath three years ago in 2011; she has not had any recent cardiac caths.  She reports that she has an allergy to regular aspirin, but she takes baby aspirin daily. She is a  non-smoker.   Dr. Rennis Golden at [facility] is her cardiologist. Dr. Tanya Nones at [facility] is her PCP.  Past Medical History  Diagnosis Date  . Hypertension   . Osteoporosis   . Arthritis   . Hyperlipidemia     diet controlled.cannot take meds  . Diastolic heart failure secondary to coronary artery disease   . Anginal pain     comes and goes   . CHF (congestive heart failure)   . Anxiety   . Shortness of breath     with exertion  . History of kidney stones   . Vertigo   . Cancer     basal cell on abd. removed  . Nocturia   . Diabetes mellitus     borderline    Past Surgical History  Procedure Laterality Date  . Abdominal hysterectomy    . Hemorrhoid surgery    . Hernia repair    . Eye surgery      cataracts bilateral  . Cardiac catheterization  2011  . Inguinal hernia repair  10/11/2011    Procedure: HERNIA REPAIR INGUINAL ADULT;  Surgeon: Almond Lint, MD;  Location: [facility] OR;  Service: General;  Laterality: Right;   Family History  Problem Relation Age of Onset  . Heart disease Father   . Cancer Sister     lung  . Cancer Brother     lung   History  Substance Use Topics  . Smoking status: Never Smoker   .  Smokeless tobacco: Never Used  . Alcohol Use: No   No OB history provided.   Review of Systems  Constitutional: Positive for fatigue. Negative for fever and chills.  HENT: Negative for congestion, rhinorrhea and sore throat.   Eyes: Negative for visual disturbance.       Veins near her eyes were "pulsating."   Respiratory: Positive for cough and shortness of breath.   Cardiovascular: Positive for chest pain. Negative for leg swelling.  Gastrointestinal: Positive for nausea. Negative for vomiting and abdominal pain.  Genitourinary: Negative for dysuria.  Musculoskeletal: Negative for back pain and neck pain.  Skin: Negative for rash.  Neurological: Positive for headaches (Mild).  Hematological: Does not bruise/bleed easily.     Allergies  Chlorhexidine; Aspirin; Atorvastatin; Ciprofloxacin; Clarithromycin; Colestipol hcl; Ezetimibe; Penicillins; Simvastatin; and Tape  Home Medications   Current Outpatient Rx  Name  Route  Sig  Dispense  Refill  . aspirin 81 MG tablet   Oral   Take 81 mg by mouth daily.         . Cyanocobalamin (VITAMIN B12 PO)   Oral   Take 1 tablet by mouth daily.         Marland Kitchen ibuprofen (ADVIL,MOTRIN) 200 MG tablet   Oral   Take 400 mg by mouth every 8 (eight) hours as needed for pain.         . metoprolol tartrate (LOPRESSOR) 25 MG tablet   Oral   Take 12.5 mg by mouth 2 (two) times daily.         . Multiple Vitamins-Minerals (OCUVITE PO)   Oral   Take 1 tablet by mouth daily.         . ranolazine (RANEXA) 500 MG 12 hr tablet   Oral   Take 1 tablet (500 mg total) by mouth daily.   112 tablet   0   . fluticasone (FLONASE) 50 MCG/ACT nasal spray      USE TWO SPRAY IN EACH NOSTRIL EVERY DAY   50 g   1   . hydrochlorothiazide (,MICROZIDE/HYDRODIURIL,) 12.5 MG capsule   Oral   Take 12.5 mg by mouth daily.            Triage Vitals: BP 157/72  Pulse 72  Temp(Src) 97.6 F (36.4 C) (Oral)  Resp 18  Ht 5\' 7"  (1.702 m)  Wt 150 lb (68.04 kg)  BMI 23.49 kg/m2  SpO2 96%  Physical Exam  Nursing note and vitals reviewed. Constitutional: She is oriented to person, place, and time. She appears well-developed and well-nourished. No distress.  HENT:  Head: Normocephalic and atraumatic.  Eyes: EOM are normal.  Neck: Neck supple. No tracheal deviation present.  Cardiovascular: Normal rate, regular rhythm and normal heart sounds.   No murmur heard. Pulmonary/Chest: Effort normal and breath sounds normal. No respiratory distress. She has no wheezes. She has no rales.  Abdominal: Bowel sounds are normal. She exhibits no distension.  Musculoskeletal: Normal range of motion. She exhibits no edema.  Neurological: She is alert and oriented to person, place, and time. No  cranial nerve deficit.  Skin: Skin is warm and dry.  Psychiatric: She has a normal mood and affect. Her behavior is normal.    ED Course  Procedures (including critical care time)  DIAGNOSTIC STUDIES: Oxygen Saturation is 96% on room air, normal by my interpretation.    COORDINATION OF CARE:  8:31 AM- Discussed treatment plan with patient which includes lab work and possible admission, and the patient agreed  to the plan.   Labs Review Labs Reviewed  COMPREHENSIVE METABOLIC PANEL - Abnormal; Notable for the following:    Glucose, Bld 128 (*)    Total Bilirubin 0.2 (*)    GFR calc non Af Amer 54 (*)    GFR calc Af Amer 63 (*)    All other components within normal limits  PRO B NATRIURETIC PEPTIDE - Abnormal; Notable for the following:    Pro B Natriuretic peptide (BNP) 574.7 (*)    All other components within normal limits  D-DIMER, QUANTITATIVE - Abnormal; Notable for the following:    D-Dimer, Quant 1.01 (*)    All other components within normal limits  CBC WITH DIFFERENTIAL  TROPONIN I   Results for orders placed during the hospital encounter of 12/23/12  CBC WITH DIFFERENTIAL      Result Value Range   WBC 10.3  4.0 - 10.5 K/uL   RBC 4.97  3.87 - 5.11 MIL/uL   Hemoglobin 14.6  12.0 - 15.0 g/dL   HCT 96.0  45.4 - 09.8 %   MCV 89.7  78.0 - 100.0 fL   MCH 29.4  26.0 - 34.0 pg   MCHC 32.7  30.0 - 36.0 g/dL   RDW 11.9  14.7 - 82.9 %   Platelets 241  150 - 400 K/uL   Neutrophils Relative % 60  43 - 77 %   Neutro Abs 6.2  1.7 - 7.7 K/uL   Lymphocytes Relative 30  12 - 46 %   Lymphs Abs 3.1  0.7 - 4.0 K/uL   Monocytes Relative 8  3 - 12 %   Monocytes Absolute 0.8  0.1 - 1.0 K/uL   Eosinophils Relative 2  0 - 5 %   Eosinophils Absolute 0.2  0.0 - 0.7 K/uL   Basophils Relative 0  0 - 1 %   Basophils Absolute 0.0  0.0 - 0.1 K/uL  COMPREHENSIVE METABOLIC PANEL      Result Value Range   Sodium 138  135 - 145 mEq/L   Potassium 3.7  3.5 - 5.1 mEq/L   Chloride 101  96 - 112  mEq/L   CO2 27  19 - 32 mEq/L   Glucose, Bld 128 (*) 70 - 99 mg/dL   BUN 21  6 - 23 mg/dL   Creatinine, Ser 5.62  0.50 - 1.10 mg/dL   Calcium 9.7  8.4 - 13.0 mg/dL   Total Protein 7.2  6.0 - 8.3 g/dL   Albumin 3.6  3.5 - 5.2 g/dL   AST 16  0 - 37 U/L   ALT 13  0 - 35 U/L   Alkaline Phosphatase 64  39 - 117 U/L   Total Bilirubin 0.2 (*) 0.3 - 1.2 mg/dL   GFR calc non Af Amer 54 (*) >90 mL/min   GFR calc Af Amer 63 (*) >90 mL/min  TROPONIN I      Result Value Range   Troponin I <0.30  <0.30 ng/mL  PRO B NATRIURETIC PEPTIDE      Result Value Range   Pro B Natriuretic peptide (BNP) 574.7 (*) 0 - 450 pg/mL  D-DIMER, QUANTITATIVE      Result Value Range   D-Dimer, Quant 1.01 (*) 0.00 - 0.48 ug/mL-FEU    Imaging Review Ct Angio Chest Pe W/cm &/or Wo Cm  12/23/2012   CLINICAL DATA:  Chest pain that radiates to the arms. Short of breath and dizziness.  EXAM: CT ANGIOGRAPHY CHEST WITH CONTRAST  TECHNIQUE: Multidetector CT imaging of the chest was performed using the standard protocol during bolus administration of intravenous contrast. Multiplanar CT image reconstructions including MIPs were obtained to evaluate the vascular anatomy.  CONTRAST:  OMNIPAQUE IOHEXOL 350 MG/ML SOLN  COMPARISON:  Chest radiograph 12/23/2012  FINDINGS: There are no filling defects within the pulmonary arteries to suggest acute pulmonary embolism. No acute findings of the aorta or great vessels. No pericardial fluid. Esophagus is normal.  No axillary or supraclavicular lymphadenopathy. No mediastinal hilar lymphadenopathy.  Review of the lung parenchyma demonstrates no pneumothorax or pleural fluid. No airspace disease. Small subpleural nodule along the superior oblique fissure measuring 5 mm (image 23) and appears benign.  Review of the MIP images confirms the above findings.  IMPRESSION: 1. No acute pulmonary embolism. 2. No acute pulmonary parenchymal process.   Electronically Signed   By: Genevive Bi M.D.    On: 12/23/2012 10:07   Dg Chest Portable 1 View  12/23/2012   CLINICAL DATA:  Sharp chest pain today. General feeling of fatigue. Cough and congestion.  EXAM: PORTABLE CHEST - 1 VIEW  COMPARISON:  CHEST x-ray 10/08/2011.  FINDINGS: Mild cephalization of the pulmonary vasculature, without frank pulmonary edema. There are some new patchy opacities in the right upper lobe, with an appearance suggestive of developing bronchopneumonia. Lungs otherwise appear clear. No pleural effusions. Heart size is within normal limits. Upper mediastinal contours are unremarkable.  IMPRESSION: 1. Findings concerning for developing bronchopneumonia in the right upper lobe. Repeat chest x-ray in 2-3 weeks following trial of antimicrobial therapy is recommended to ensure resolution of these findings (i.e., to exclude a centrally obstructing lesion). 2. Pulmonary venous congestion, without frank pulmonary edema.   Electronically Signed   By: Trudie Reed M.D.   On: 12/23/2012 08:29    EKG Interpretation     Ventricular Rate:  71 PR Interval:  144 QRS Duration: 94 QT Interval:  406 QTC Calculation: 441 R Axis:   35 Text Interpretation:  Normal sinus rhythm Left ventricular hypertrophy ST \\T \ T wave abnormality, consider anterolateral ischemia Abnormal ECG When compared with ECG of 08-Oct-2011 10:39, Nonspecific T wave abnormality has replaced inverted T waves in Inferior leads            MDM   1. Chest pain    Patient's cardiac cath from December of 2011 reviewed. She had at worse 60% LAD narrowing. Patient's angina since that time is felt to be micro-vascular in nature. However today's episode was more intense and lasted longer resolved here with additional nitroglycerin and aspirin. Patient is now chest pain-free. First troponin was negative EKG without any acute changes. Patient's initial chest x-ray raises concern for pneumonia this would be a community-acquired pneumonia patient received the Levaquin  for that. Patient's d-dimer was elevated so to rule out a pulmonary embolism CT angios was done no evidence of pulmonary embolism. In addition there was no evidence of pneumonia on the CT angios so pneumonia is probably not the diagnosis. Patient will need admission for chest pain she'll be admitted to telemetry obs team 1.  I personally performed the services described in this documentation, which was scribed in my presence. The recorded information has been reviewed and is accurate.      Shelda Jakes, MD 12/23/12 1049

## 2012-12-24 ENCOUNTER — Encounter (HOSPITAL_COMMUNITY): Payer: Self-pay | Admitting: Cardiology

## 2012-12-24 ENCOUNTER — Telehealth (HOSPITAL_COMMUNITY): Payer: Self-pay

## 2012-12-24 DIAGNOSIS — I517 Cardiomegaly: Secondary | ICD-10-CM

## 2012-12-24 DIAGNOSIS — I201 Angina pectoris with documented spasm: Secondary | ICD-10-CM

## 2012-12-24 DIAGNOSIS — I200 Unstable angina: Secondary | ICD-10-CM

## 2012-12-24 LAB — CBC
Hemoglobin: 14.1 g/dL (ref 12.0–15.0)
MCH: 29.3 pg (ref 26.0–34.0)
MCV: 90.7 fL (ref 78.0–100.0)
Platelets: 217 10*3/uL (ref 150–400)
RBC: 4.82 MIL/uL (ref 3.87–5.11)
WBC: 9.6 10*3/uL (ref 4.0–10.5)

## 2012-12-24 LAB — BASIC METABOLIC PANEL
CO2: 32 mEq/L (ref 19–32)
Calcium: 9.3 mg/dL (ref 8.4–10.5)
Glucose, Bld: 95 mg/dL (ref 70–99)
Potassium: 4.4 mEq/L (ref 3.5–5.1)
Sodium: 139 mEq/L (ref 135–145)

## 2012-12-24 MED ORDER — RANOLAZINE ER 500 MG PO TB12
500.0000 mg | ORAL_TABLET | Freq: Once | ORAL | Status: AC
  Administered 2012-12-24: 500 mg via ORAL
  Filled 2012-12-24: qty 1

## 2012-12-24 MED ORDER — RANOLAZINE ER 500 MG PO TB12: 500.0000 mg | ORAL_TABLET | Freq: Two times a day (BID) | ORAL | Status: DC

## 2012-12-24 MED ORDER — RANOLAZINE ER 500 MG PO TB12
500.0000 mg | ORAL_TABLET | Freq: Two times a day (BID) | ORAL | Status: DC
  Filled 2012-12-24 (×4): qty 1

## 2012-12-24 NOTE — Consult Note (Signed)
CARDIOLOGY CONSULT NOTE   Patient ID: Lauren House MRN: [id number] DOB/AGE: [birth year] 75 y.o.  Admit Date: 12/23/2012 Referring Physician: PTH Primary Physician: Leo Grosser, MD Consulting Cardiologist: Jonelle Sidle, MD Primary Cardiologist: [place] Hilty, MD  Reason for Consultation: Chest Pain with CAD and apical hypertrophic CM  Clinical Summary Lauren House is a 77 y.o.female admitted after an episode of prolonged chest pressure with radiation to bilateral shoulders that begin in the early morning hours - later resolved with NTG in the ER and has not recurred. She is followed by Dr. Rennis Golden with know history of non-obstructive CAD managed medically and also apical variant hypertrophic cardiomyopathy. She has regular chronic angina on Ranexa and low dose beta blocker. Of note, she only takes the Ranexa once a day.  Review of ER records did demonstrate some mild bradycardia with rate of 59 bpm after pain was relieved. On arrival BP was 157/72 HR 72, O2 Sat 96%. Review of labs are unremarkable, with the exception of elevated D-Dimer, 1.01. Pro-BNP of 574.  CT was negative for PE. CXR did demonstrate "concern for developing bronchopneumonia in the right upper lobe. Pulmonary vascular congestion without frank pulmonary edema."   Allergies  Allergen Reactions  . Chlorhexidine Itching  . Aspirin     REACTION: nose bleeds  . Atorvastatin     REACTION: muscle pain  . Ciprofloxacin     REACTION: nausea  . Clarithromycin   . Colestipol Hcl     REACTION: hallucinations  . Ezetimibe     REACTION: GI upset  . Penicillins     REACTION: rash  . Simvastatin     REACTION: muscle spasms  . Tape Rash    Medications Scheduled Medications: . aspirin EC  162 mg Oral Daily  . enoxaparin (LOVENOX) injection  40 mg Subcutaneous Q24H  . hydrochlorothiazide  12.5 mg Oral Daily  . metoprolol tartrate  12.5 mg Oral BID  . ranolazine  500 mg Oral Daily  . sodium chloride  3 mL  Intravenous Q12H    PRN Medications: sodium chloride, acetaminophen, acetaminophen, ibuprofen, morphine injection, nitroGLYCERIN, ondansetron (ZOFRAN) IV, ondansetron, sodium chloride, traZODone   Past Medical History  Diagnosis Date  . Essential hypertension, benign   . Osteoporosis   . Arthritis   . Hyperlipidemia     Diet controlled.cannot take meds  . Apical variant hypertrophic cardiomyopathy   . Anxiety   . History of kidney stones   . Vertigo   . History of skin cancer     Basal cell - abdomen  . Nocturia   . Borderline diabetes mellitus   . Coronary atherosclerosis of native coronary artery     Nonobstructive - 50-60% LAD (normal FFR) at catheterization December 2011  . Angina pectoris, variant     Likely microvascular    Past Surgical History  Procedure Laterality Date  . Abdominal hysterectomy    . Hemorrhoid surgery    . Eye surgery      cataracts bilateral  . Inguinal hernia repair  10/11/2011    Procedure: HERNIA REPAIR INGUINAL ADULT;  Surgeon: Almond Lint, MD;  Location: [facility] OR;  Service: General;  Laterality: Right;    Family History  Problem Relation Age of Onset  . Heart disease Father   . Lung cancer Sister   . Lung cancer Brother     Social History Ms. Arras reports that she has never smoked. She has never used smokeless tobacco. Ms. Shepherd reports that she does not  drink alcohol.  Review of Systems Otherwise reviewed and negative except as outlined.  Physical Examination Blood pressure 123/68, pulse 59, temperature 98.3 F (36.8 C), temperature source Oral, resp. rate 20, height 5\' 7"  (1.702 m), weight 153 lb (69.4 kg), SpO2 95.00%.  Intake/Output Summary (Last 24 hours) at 12/24/12 1023 Last data filed at 12/23/12 1700  Gross per 24 hour  Intake    240 ml  Output      0 ml  Net    240 ml    Telemetry: Sinus rhythm.  Comfortable at rest. HEENT: Conjunctiva and lids normal, oropharynx clear with moist mucosa. Neck: Supple, no  elevated JVP or carotid bruits, no thyromegaly. Lungs: Clear to auscultation, nonlabored breathing at rest. Cardiac: Regular rate and rhythm, no S3 or significant systolic murmur, no pericardial rub. Abdomen: Soft, nontender, no hepatomegaly, bowel sounds present, no guarding or rebound. Extremities: No pitting edema, distal pulses 2+. Skin: Warm and dry. Musculoskeletal: No kyphosis. Neuropsychiatric: Alert and oriented x3, affect grossly appropriate.   Lab Results  Basic Metabolic Panel:  Recent Labs Lab 12/23/12 0815 12/24/12 0557  NA 138 139  K 3.7 4.4  CL 101 102  CO2 27 32  GLUCOSE 128* 95  BUN 21 14  CREATININE 0.99 1.04  CALCIUM 9.7 9.3    Liver Function Tests:  Recent Labs Lab 12/23/12 0815  AST 16  ALT 13  ALKPHOS 64  BILITOT 0.2*  PROT 7.2  ALBUMIN 3.6    CBC:  Recent Labs Lab 12/23/12 0815 12/24/12 0557  WBC 10.3 9.6  NEUTROABS 6.2  --   HGB 14.6 14.1  HCT 44.6 43.7  MCV 89.7 90.7  PLT 241 217    Cardiac Enzymes:  Recent Labs Lab 12/23/12 0815 12/23/12 1401 12/23/12 1917  TROPONINI <0.30 <0.30 <0.30    Radiology: Ct Angio Chest Pe W/cm &/or Wo Cm  12/23/2012   CLINICAL DATA:  Chest pain that radiates to the arms. Short of breath and dizziness.  EXAM: CT ANGIOGRAPHY CHEST WITH CONTRAST  TECHNIQUE: Multidetector CT imaging of the chest was performed using the standard protocol during bolus administration of intravenous contrast. Multiplanar CT image reconstructions including MIPs were obtained to evaluate the vascular anatomy.  CONTRAST:  OMNIPAQUE IOHEXOL 350 MG/ML SOLN  COMPARISON:  Chest radiograph 12/23/2012  FINDINGS: There are no filling defects within the pulmonary arteries to suggest acute pulmonary embolism. No acute findings of the aorta or great vessels. No pericardial fluid. Esophagus is normal.  No axillary or supraclavicular lymphadenopathy. No mediastinal hilar lymphadenopathy.  Review of the lung parenchyma  demonstrates no pneumothorax or pleural fluid. No airspace disease. Small subpleural nodule along the superior oblique fissure measuring 5 mm (image 23) and appears benign.  Review of the MIP images confirms the above findings.  IMPRESSION: 1. No acute pulmonary embolism. 2. No acute pulmonary parenchymal process.   Electronically Signed   By: Genevive Bi M.D.   On: 12/23/2012 10:07   Dg Chest Portable 1 View  12/23/2012   CLINICAL DATA:  Sharp chest pain today. General feeling of fatigue. Cough and congestion.  EXAM: PORTABLE CHEST - 1 VIEW  COMPARISON:  CHEST x-ray 10/08/2011.  FINDINGS: Mild cephalization of the pulmonary vasculature, without frank pulmonary edema. There are some new patchy opacities in the right upper lobe, with an appearance suggestive of developing bronchopneumonia. Lungs otherwise appear clear. No pleural effusions. Heart size is within normal limits. Upper mediastinal contours are unremarkable.  IMPRESSION: 1. Findings concerning for  developing bronchopneumonia in the right upper lobe. Repeat chest x-ray in 2-3 weeks following trial of antimicrobial therapy is recommended to ensure resolution of these findings (i.e., to exclude a centrally obstructing lesion). 2. Pulmonary venous congestion, without frank pulmonary edema.   Electronically Signed   By: Trudie Reed M.D.   On: 12/23/2012 08:29     ECG: NSR with significant LVH HR 62 bpm.   Impression and Recommendations  1. Unstable Angina, likely microvascular with history of nonobstructive CAD and apical variant hypertrophic cardiomyopathy: She is on Ranexa but takes it daily instead of BID. Negative cardiac enzymes argue against ACS, and ECG abnormalities are consistent with known cardiomyopathy.  She is clinically stable this morning with no recurrent chest pain, would like to go home. No further testing is being arranged at this time Will continue BB and ASA, encourage her to try taking Ranexa twice daily.  2. CAD:  Most recent cardiac cath in 2011. Nonobstructive disease was noted at that time. No further stress testing is planned on this admission. She will continue to follow with Dr. Rennis Golden in [facility] on discharge.  3. Hypertrophic CM:  No symptoms at this time of dyspnea or dizziness. HR is well controlled.  4. Hypertension: Currently well controlled. Will continue current regimen.   Signed: Bettey Mare. Lyman Bishop NP [facility] 12/24/2012, 10:23 AM Co-Sign MD   Attending note:  Patient seen and examined. Modified above note by Ms. Lawrence NP and reviewed available records. Ms. Diekman presents after a prolonged episode of angina occurring at rest, was ultimately nitroglycerin responsive. She has recurring microvascular angina in the setting of known nonobstructive CAD and also apical variant hypertrophic cardiomyopathy. She is clinically stable this morning and would like to go home. Cardiac markers argue against ACS, ECG abnormalities are consistent with her known cardiomyopathy. Medications were reviewed. Plan to continue on beta blocker, asked her to try and take Ranexa twice daily to see if this led to further improvement in recurring angina. Blood pressure has precluded long-acting nitrate. Otherwise keep regular followup with Dr. Rennis Golden.  Jonelle Sidle, M.D., F.A.C.C.

## 2012-12-24 NOTE — Progress Notes (Signed)
Utilization Review Complete  

## 2012-12-24 NOTE — Progress Notes (Signed)
*  PRELIMINARY RESULTS* Echocardiogram 2D Echocardiogram has been performed.  Chella Chapdelaine 12/24/2012, 11:16 AM

## 2012-12-24 NOTE — Discharge Summary (Signed)
Patient seen, independently examined and chart reviewed. I agree with exam, assessment and plan discussed with Toya Smothers, NP.  77 year old woman with history of nonobstructive coronary artery disease managed medically presented to the emergency department with chest pain.  Today she feels quite well, no recurrent chest pain. Vitals are stable. Cardiovascular regular rate and rhythm. Respiratory clear to auscultation bilaterally. Troponins negative.  We will plan discharge home today with continued medical management of nonobstructive coronary artery disease, microvascular angina as per cardiology recommendations continue beta blocker, Ranexa increased to BID. Blood pressure precludes long-acting nitrate. Followup with her cardiologist as an outpatient.  Brendia Sacks, MD [facility] [phone]

## 2012-12-24 NOTE — Progress Notes (Signed)
AVS reviewed with patient.  Prescriptions provided to patient.  Pt verbalized understanding of discharge instructions.  Pt's IV removed.  Site WNL.  Pt reports all belongings intact and in possession at time of discharge.  Pt transported by NT via w/c to main entrance for discharge.  Pt stable at time of discharge.

## 2012-12-24 NOTE — Discharge Summary (Signed)
Physician Discharge Summary  Lauren House ZOX:[id number] DOB: 08-01-1935 DOA: 12/23/2012  PCP: Leo Grosser, MD  Admit date: 12/23/2012 Discharge date: 12/24/2012  Time spent: 40 minutes  Recommendations for Outpatient Follow-up:  1. Has appointment with Dr. Rennis Golden 11/19 for evaluation of symptoms and follow echo results.   Discharge Diagnoses:  Principal Problem:   Chest pain Active Problems:   HYPERLIPIDEMIA   HYPERTENSION   Apical variant hypertrophic cardiomyopathy   Angina pectoris, variant   Discharge Condition: stable at discharg  Diet recommendation: heart healthy  Filed Weights   12/23/12 0752 12/23/12 1135 12/24/12 0500  Weight: 68.04 kg (150 lb) 66.2 kg (145 lb 15.1 oz) 69.4 kg (153 lb)    History of present illness:  Lauren House is a very pleasant 77 y.o. female with a past medical history that includes hypertension, hyperlipidemia, chronic diastolic heart failure secondary to coronary artery disease, anxiety, anginal pain, apical hypertrophic cardiomyopathy presented to the emergency department on 12/23/12 with chief complaint of chest pain. She stated that on 12/22/12 she "did not feel well all day". She denied any specific pain/discomfort she denied chest pain nausea vomiting at that time. She stated that she just felt like she had no energy. She denied fever chills or recent sick contacts. She stated she slept her normal  and when she awakened in the morning she "noticed" that she had a pressure feeling on right side of her chest. She rated this pain a 10 out of 10 at its most severe. She stated that the pain  radiates to both shoulders at times. Associated symptoms include diaphoresis and nausea without vomiting. She denied any worsening shortness of breath is her baseline is slightly short of breath. She stated at this time she took an 81 mg aspirin and a nitroglycerin glycerin tablet with little relief. She indicated that the pain did not subside so she  came to the emergency department. In the emergency department she was given another aspirin and another nitroglycerin tablet and at the time of my exam she is pain-free. Of note patient indicates that she had a cardiac cath 3 years ago. Chart review indicates the cath yielded 60% LAD narrowing. Patient's angina since that time was felt to be microvascular in nature. Initial troponin in the emergency department negative. Chest x-ray with findings concerning for developing bronchopneumonia in the right upper lobe with pulmonary venous congestion without frank pulmonary edema. CT anterior chest was done today yields no acute pulmonary embolism and no acute pulmonary parenchymal process. EKG yields normal sinus rhythm with left ventricular hypertrophy. No acute changes from EKG done in August of 2013. Vital signs in the emergency department are unremarkable.     Hospital Course:  Chest pain: Atypical. At rest.  No further chest pain. Patient with history of anginal pain. She takes ranexa once daily currently. Troponin negative x3. Repeat EKG with NSR and LVH without acute changes. Echo results pending. Lipid panel with cholesterol 287, triglycerides 368 VLDL.   Continue low-dose aspirin and statin. Evaluated by cardiology who opined that negative CE argue against ACS and ECG abnormalities consistent with known cardiomyopathy. Cardiology  recommended she take her Ranexa BID. No further stress testing on this admission warranted. Pt has appointment with Dr. Rennis Golden 01/14/13. Follow echo results.  Active Problems:  HYPERLIPIDEMIA: Continue her statin.    HYPERTENSION controlled. Systolic blood pressure range 811- 150. Continue home Lopressor. Marland Kitchen  Apical variant hypertrophic cardiomyopathy: Chart review indicates last cardiology visit June 2014. Ranexa increased  to BID. Pt has appointment with Dr. Rennis Golden as above.  CAD: most recent cardiac cath 2011. Non obstructive disease at that time. Evaluated by cardiology  and no further testing planned this admission.    Procedures:  none  Consultations:  cardiology  Discharge Exam: Filed Vitals:   12/24/12 0546  BP: 123/68  Pulse: 59  Temp: 98.3 F (36.8 C)  Resp: 20   General: well nourished NAD Cardiovascular: RRR No MGR No LE edema Respiratory: normal effort BS clear to ascultation bilaterally no wheeze  Discharge Instructions   Future Appointments Provider Department Dept Phone   12/25/2012 8:00 AM Juel Burrow, [place] [facility] [phone]   12/30/2012 8:00 AM Matilde Haymaker, PT [place] [facility] [phone]   01/01/2013 8:00 AM Juel Burrow, PTA [place] [facility] [phone]       Medication List         aspirin 81 MG tablet  Take 81 mg by mouth daily.     fluticasone 50 MCG/ACT nasal spray  Commonly known as:  FLONASE  USE TWO SPRAY IN EACH NOSTRIL EVERY DAY     ibuprofen 200 MG tablet  Commonly known as:  ADVIL,MOTRIN  Take 400 mg by mouth every 8 (eight) hours as needed for pain.     metoprolol tartrate 25 MG tablet  Commonly known as:  LOPRESSOR  Take 12.5 mg by mouth 2 (two) times daily.     OCUVITE PO  Take 1 tablet by mouth daily.     ranolazine 500 MG 12 hr tablet  Commonly known as:  RANEXA  Take 1 tablet (500 mg total) by mouth 2 (two) times daily.     VITAMIN B12 PO  Take 1 tablet by mouth daily.       Allergies  Allergen Reactions  . Chlorhexidine Itching  . Aspirin     REACTION: nose bleeds  . Atorvastatin     REACTION: muscle pain  . Ciprofloxacin     REACTION: nausea  . Clarithromycin   . Colestipol Hcl     REACTION: hallucinations  . Ezetimibe     REACTION: GI upset  . Penicillins     REACTION: rash  . Simvastatin     REACTION: muscle spasms  . Tape Rash      The results of significant diagnostics from this hospitalization (including imaging, microbiology, ancillary and laboratory) are listed below for  reference.    Significant Diagnostic Studies: Ct Angio Chest Pe W/cm &/or Wo Cm  12/23/2012   CLINICAL DATA:  Chest pain that radiates to the arms. Short of breath and dizziness.  EXAM: CT ANGIOGRAPHY CHEST WITH CONTRAST  TECHNIQUE: Multidetector CT imaging of the chest was performed using the standard protocol during bolus administration of intravenous contrast. Multiplanar CT image reconstructions including MIPs were obtained to evaluate the vascular anatomy.  CONTRAST:  OMNIPAQUE IOHEXOL 350 MG/ML SOLN  COMPARISON:  Chest radiograph 12/23/2012  FINDINGS: There are no filling defects within the pulmonary arteries to suggest acute pulmonary embolism. No acute findings of the aorta or great vessels. No pericardial fluid. Esophagus is normal.  No axillary or supraclavicular lymphadenopathy. No mediastinal hilar lymphadenopathy.  Review of the lung parenchyma demonstrates no pneumothorax or pleural fluid. No airspace disease. Small subpleural nodule along the superior oblique fissure measuring 5 mm (image 23) and appears benign.  Review of the MIP images confirms the above findings.  IMPRESSION: 1. No acute pulmonary embolism. 2. No acute pulmonary  parenchymal process.   Electronically Signed   By: Genevive Bi M.D.   On: 12/23/2012 10:07   Dg Chest Portable 1 View  12/23/2012   CLINICAL DATA:  Sharp chest pain today. General feeling of fatigue. Cough and congestion.  EXAM: PORTABLE CHEST - 1 VIEW  COMPARISON:  CHEST x-ray 10/08/2011.  FINDINGS: Mild cephalization of the pulmonary vasculature, without frank pulmonary edema. There are some new patchy opacities in the right upper lobe, with an appearance suggestive of developing bronchopneumonia. Lungs otherwise appear clear. No pleural effusions. Heart size is within normal limits. Upper mediastinal contours are unremarkable.  IMPRESSION: 1. Findings concerning for developing bronchopneumonia in the right upper lobe. Repeat chest x-ray in 2-3 weeks  following trial of antimicrobial therapy is recommended to ensure resolution of these findings (i.e., to exclude a centrally obstructing lesion). 2. Pulmonary venous congestion, without frank pulmonary edema.   Electronically Signed   By: Trudie Reed M.D.   On: 12/23/2012 08:29    Microbiology: No results found for this or any previous visit (from the past 240 hour(s)).   Labs: Basic Metabolic Panel:  Recent Labs Lab 12/23/12 0815 12/24/12 0557  NA 138 139  K 3.7 4.4  CL 101 102  CO2 27 32  GLUCOSE 128* 95  BUN 21 14  CREATININE 0.99 1.04  CALCIUM 9.7 9.3   Liver Function Tests:  Recent Labs Lab 12/23/12 0815  AST 16  ALT 13  ALKPHOS 64  BILITOT 0.2*  PROT 7.2  ALBUMIN 3.6   No results found for this basename: LIPASE, AMYLASE,  in the last 168 hours No results found for this basename: AMMONIA,  in the last 168 hours CBC:  Recent Labs Lab 12/23/12 0815 12/24/12 0557  WBC 10.3 9.6  NEUTROABS 6.2  --   HGB 14.6 14.1  HCT 44.6 43.7  MCV 89.7 90.7  PLT 241 217   Cardiac Enzymes:  Recent Labs Lab 12/23/12 0815 12/23/12 1401 12/23/12 1917  TROPONINI <0.30 <0.30 <0.30   BNP: BNP (last 3 results)  Recent Labs  12/23/12 0815  PROBNP 574.7*   CBG: No results found for this basename: GLUCAP,  in the last 168 hours     Signed:  Gwenyth Bender  [facility] 12/24/2012, 10:41 AM

## 2012-12-25 ENCOUNTER — Ambulatory Visit (HOSPITAL_COMMUNITY): Payer: Medicare Other

## 2012-12-30 ENCOUNTER — Ambulatory Visit (HOSPITAL_COMMUNITY)
Admission: RE | Admit: 2012-12-30 | Discharge: 2012-12-30 | Disposition: A | Discharge status: Home / Self Care | Payer: Medicare Other | Source: Ambulatory Visit | Attending: Family Medicine | Admitting: Family Medicine

## 2012-12-30 DIAGNOSIS — M160 Bilateral primary osteoarthritis of hip: Secondary | ICD-10-CM

## 2012-12-30 DIAGNOSIS — IMO0001 Reserved for inherently not codable concepts without codable children: Secondary | ICD-10-CM | POA: Insufficient documentation

## 2012-12-30 DIAGNOSIS — M25552 Pain in left hip: Secondary | ICD-10-CM

## 2012-12-30 DIAGNOSIS — R269 Unspecified abnormalities of gait and mobility: Secondary | ICD-10-CM | POA: Insufficient documentation

## 2012-12-30 DIAGNOSIS — M25559 Pain in unspecified hip: Secondary | ICD-10-CM | POA: Insufficient documentation

## 2012-12-30 DIAGNOSIS — M7072 Other bursitis of hip, left hip: Secondary | ICD-10-CM

## 2012-12-30 DIAGNOSIS — I10 Essential (primary) hypertension: Secondary | ICD-10-CM | POA: Insufficient documentation

## 2012-12-30 NOTE — Progress Notes (Signed)
Physical Therapy Treatment Patient Details  Name: Lauren House MRN: [id number] Date of Birth: Oct 27, 1935  Today's Date: 12/30/2012 Time: 0801-0845 PT Time Calculation (min): 44 min Charges: Manual: [phone] [place]: [phone] TE: [phone] Visit#: 3 of 8  Re-eval: 01/15/13    Authorization: BCBS Medicare  Authorization Time Period:    Authorization Visit#: 3 of 10   Subjective: Symptoms/Limitations Symptoms: Pt was recently admitted to [facility] for nonobstruction cornary artery diease from 10/27-10/29./  Was d/c home with instructions to f/u with cardiologist in outpatient setting,.  Today she states that she is overall fatigued.  Has pain to IV site which she called and spoke with nurse about managing with hot compresses.  Due to this she has not been able to complete exercises for her hip.  States that her hip remains painful, however overall is feeling better.  Pain Assessment Currently in Pain?: Yes Pain Score: 5  Pain Location: Hip  Precautions/Restrictions     Exercise/Treatments Stretches Active Hamstring Stretch: 2 reps;30 seconds ITB Stretch: 2 reps;30 seconds;Limitations ITB Stretch Limitations: Prone Quadriceps stretch 2x30 seconds Piriformis Stretch: 2 reps;30 seconds (supine)    Modalities Modalities: Ultrasound Manual Therapy Manual Therapy: Myofascial release Joint Mobilization: GradeI-III PA to Lt SI joint in 3 locations  Myofascial Release: after joint mobilizations to decrease fascial restrictions w/soft and deep tissue massage after to Lt gluteal, tensor fascia lata (TFL) and piriformis region.  Physical Therapy Assessment and Plan PT Assessment and Plan Clinical Impression Statement: Added manual therapy and modalities for pain control to help decrease muscle spasms and fascial restrctions to Lt gluteal region and finished with stretching.  Pt with 75% decrease gluteal spasms and restrictions at the end.  PT Plan: May add back standing and mat activities to  improve LE and gluteal strength.      Goals Home Exercise Program Pt/caregiver will Perform Home Exercise Program: Independently PT Goal: Perform Home Exercise Program - Progress: Progressing toward goal PT Short Term Goals Time to Complete Short Term Goals: 2 weeks PT Short Term Goal 1: Pt will improve LLE flexibility to [facility] in order to decrease pain to left hip to a 1/10 when sleeping on her left side.  PT Short Term Goal 1 - Progress: Progressing toward goal PT Short Term Goal 2: Pt will improve her LLE strength by 1/2 muscle grade for improved gait mechanics.  PT Short Term Goal 2 - Progress: Progressing toward goal PT Short Term Goal 3: Pt will improve her proriocetive awareness and demonstrate Lt and Rt SLS x15 seconds on solid surface to decrease risk of falls.  PT Short Term Goal 3 - Progress: Progressing toward goal PT Long Term Goals Time to Complete Long Term Goals: 4 weeks PT Long Term Goal 1: Pt will be educated in an adavanced HEP to address balance and core strength.` PT Long Term Goal 1 - Progress: Progressing toward goal PT Long Term Goal 2: Pt will be independent with core coordination and strength for improved core stability to decrease risk of falls.  PT Long Term Goal 2 - Progress: Progressing toward goal Long Term Goal 3: Pt will improve her FOTO to Status greater than 64% and Limitation less than 36% for improved percieved functional ability.  Long Term Goal 3 Progress: Progressing toward goal  Problem List Patient Active Problem List   Diagnosis Date Noted  . Angina pectoris, variant 12/24/2012  . Left hip pain 12/16/2012  . Bursitis of left hip 12/16/2012  . Osteoarthritis, hip, bilateral 12/16/2012  .  Chest pain 08/15/2012  . Apical variant hypertrophic cardiomyopathy 08/15/2012  . Bilateral inguinal hernia, right symptomatic 09/21/2011  . COMEDO 10/18/2008  . POSTMENOPAUSAL OSTEOPOROSIS 09/01/2008  . HIP PAIN, LEFT 01/19/2008  . CARCINOMA, BASAL CELL,  SKIN 10/02/2006  . VARICOSE VEIN 09/24/2006  . ACTINIC KERATOSIS 09/24/2006  . NECK PAIN 09/24/2006  . BACK PAIN 09/24/2006  . NEOP, BNG, SCALP/SKIN, NECK 04/16/2006  . FIBROIDS, UTERUS 04/16/2006  . HYPERLIPIDEMIA 04/16/2006  . MACULAR DEGENERATION 04/16/2006  . HYPERTENSION 04/16/2006  . ALLERGIC RHINITIS 04/16/2006  . OVERACTIVE BLADDER 04/16/2006  . URINARY INCONTINENCE 04/16/2006  . SKIN CANCER, HX OF 04/16/2006  . VERTIGO, HX OF 04/16/2006  . RENAL CALCULUS, HX OF 04/16/2006    PT - End of Session Activity Tolerance: Patient tolerated treatment well General Behavior During Therapy: [facility] for tasks assessed/performed  GP    Reizy Dunlow , MPT, ATC   12/30/2012, 9:07 AM

## 2013-01-01 ENCOUNTER — Ambulatory Visit (HOSPITAL_COMMUNITY): Payer: Medicare Other

## 2013-01-01 ENCOUNTER — Emergency Department (HOSPITAL_COMMUNITY)
Admission: EM | Admit: 2013-01-01 | Discharge: 2013-01-01 | Disposition: A | Discharge status: Home / Self Care | Payer: Medicare Other | Attending: Emergency Medicine | Admitting: Emergency Medicine

## 2013-01-01 ENCOUNTER — Encounter (HOSPITAL_COMMUNITY): Payer: Self-pay | Admitting: Emergency Medicine

## 2013-01-01 DIAGNOSIS — E785 Hyperlipidemia, unspecified: Secondary | ICD-10-CM | POA: Insufficient documentation

## 2013-01-01 DIAGNOSIS — Z87442 Personal history of urinary calculi: Secondary | ICD-10-CM | POA: Insufficient documentation

## 2013-01-01 DIAGNOSIS — Z7982 Long term (current) use of aspirin: Secondary | ICD-10-CM | POA: Insufficient documentation

## 2013-01-01 DIAGNOSIS — IMO0002 Reserved for concepts with insufficient information to code with codable children: Secondary | ICD-10-CM | POA: Insufficient documentation

## 2013-01-01 DIAGNOSIS — M129 Arthropathy, unspecified: Secondary | ICD-10-CM | POA: Insufficient documentation

## 2013-01-01 DIAGNOSIS — Z792 Long term (current) use of antibiotics: Secondary | ICD-10-CM | POA: Insufficient documentation

## 2013-01-01 DIAGNOSIS — I2510 Atherosclerotic heart disease of native coronary artery without angina pectoris: Secondary | ICD-10-CM | POA: Insufficient documentation

## 2013-01-01 DIAGNOSIS — Z880 Allergy status to penicillin: Secondary | ICD-10-CM | POA: Insufficient documentation

## 2013-01-01 DIAGNOSIS — Z9861 Coronary angioplasty status: Secondary | ICD-10-CM | POA: Insufficient documentation

## 2013-01-01 DIAGNOSIS — Z85828 Personal history of other malignant neoplasm of skin: Secondary | ICD-10-CM | POA: Insufficient documentation

## 2013-01-01 DIAGNOSIS — Z79899 Other long term (current) drug therapy: Secondary | ICD-10-CM | POA: Insufficient documentation

## 2013-01-01 DIAGNOSIS — I809 Phlebitis and thrombophlebitis of unspecified site: Secondary | ICD-10-CM

## 2013-01-01 DIAGNOSIS — I201 Angina pectoris with documented spasm: Secondary | ICD-10-CM | POA: Insufficient documentation

## 2013-01-01 DIAGNOSIS — I10 Essential (primary) hypertension: Secondary | ICD-10-CM | POA: Insufficient documentation

## 2013-01-01 DIAGNOSIS — I808 Phlebitis and thrombophlebitis of other sites: Secondary | ICD-10-CM | POA: Insufficient documentation

## 2013-01-01 DIAGNOSIS — F411 Generalized anxiety disorder: Secondary | ICD-10-CM | POA: Insufficient documentation

## 2013-01-01 MED ORDER — DOXYCYCLINE HYCLATE 100 MG PO CAPS: 100.0000 mg | ORAL_CAPSULE | Freq: Two times a day (BID) | ORAL | Status: DC

## 2013-01-01 MED ORDER — TRAMADOL HCL 50 MG PO TABS: 50.0000 mg | ORAL_TABLET | Freq: Four times a day (QID) | ORAL | Status: DC | PRN

## 2013-01-01 NOTE — ED Notes (Signed)
Patient reports redness, swelling,  and fever in left arm. Per patient was admitted for observation a week ago and had IV access in [facility]. Streaking noted.

## 2013-01-01 NOTE — ED Notes (Signed)
Discharge instructions given to pt.  Verbalized understanding of all scripts given.

## 2013-01-01 NOTE — ED Provider Notes (Signed)
CSN: [id number]     Arrival date & time 01/01/13  1914 History  This chart was scribed for Shelda Jakes, MD,  by Ashley Jacobs, ED Scribe. The patient was seen in room APA19/APA19 and the patient's care was started at 8:03 AM.  First MD Initiated Contact with Patient 01/01/13 0751     Chief Complaint  Patient presents with  . Cellulitis   (Consider location/radiation/quality/duration/timing/severity/associated sxs/prior Treatment) The history is provided by the patient and medical records. No language interpreter was used.   HPI Comments: Lauren House is a 77 y.o. female who presents to the Emergency Department complaining of cellulitis in her left forearm that occurred three days ago.  Pt states having a throbbing sensation last night and being unable to sleep due to pain.  She explain the site feels "real hot" to the touch and the pain is 7/10 in severity. Since her last visit to the ED she reports the site has enlarged and spread. Pt was admitted to [facility] 10/28. Pt has tried a Development worker, international aid Asprin for the pain. She has a past medical hx of HTN, osteoporosis, arthritis, DM, coronary arthrosclerosis of native coronary artery, and skin cancer.  Pt's PCP is Dr. Lynnea Ferrier.  Past Medical History  Diagnosis Date  . Essential hypertension, benign   . Osteoporosis   . Arthritis   . Hyperlipidemia     Diet controlled.cannot take meds  . Apical variant hypertrophic cardiomyopathy   . Anxiety   . History of kidney stones   . Vertigo   . History of skin cancer     Basal cell - abdomen  . Nocturia   . Borderline diabetes mellitus   . Coronary atherosclerosis of native coronary artery     Nonobstructive - 50-60% LAD (normal FFR) at catheterization December 2011  . Angina pectoris, variant     Likely microvascular   Past Surgical History  Procedure Laterality Date  . Abdominal hysterectomy    . Hemorrhoid surgery    . Eye surgery      cataracts bilateral  . Inguinal hernia  repair  10/11/2011    Procedure: HERNIA REPAIR INGUINAL ADULT;  Surgeon: Almond Lint, MD;  Location: [facility] OR;  Service: General;  Laterality: Right;   Family History  Problem Relation Age of Onset  . Heart disease Father   . Lung cancer Sister   . Lung cancer Brother    History  Substance Use Topics  . Smoking status: Never Smoker   . Smokeless tobacco: Never Used  . Alcohol Use: No   OB History   Grav Para Term Preterm Abortions TAB SAB Ect Mult Living   4 4 4       4      Review of Systems  Constitutional: Negative for chills.  Gastrointestinal: Negative for diarrhea.  Genitourinary: Negative for dysuria and frequency.  Musculoskeletal: Negative for back pain and joint swelling.  Skin: Positive for rash (antecubital vein of the left arm).  Hematological: Does not bruise/bleed easily.  All other systems reviewed and are negative.    Allergies  Chlorhexidine; Aspirin; Atorvastatin; Ciprofloxacin; Clarithromycin; Colestipol hcl; Ezetimibe; Penicillins; Simvastatin; and Tape  Home Medications   Current Outpatient Rx  Name  Route  Sig  Dispense  Refill  . aspirin 81 MG tablet   Oral   Take 81 mg by mouth daily.         . Cyanocobalamin (VITAMIN B12 PO)   Oral   Take 1 tablet by mouth  daily.         . doxycycline (VIBRAMYCIN) 100 MG capsule   Oral   Take 1 capsule (100 mg total) by mouth 2 (two) times daily.   14 capsule   0   . fluticasone (FLONASE) 50 MCG/ACT nasal spray      USE TWO SPRAY IN EACH NOSTRIL EVERY DAY   50 g   1   . ibuprofen (ADVIL,MOTRIN) 200 MG tablet   Oral   Take 400 mg by mouth every 8 (eight) hours as needed for pain.         . metoprolol tartrate (LOPRESSOR) 25 MG tablet   Oral   Take 12.5 mg by mouth 2 (two) times daily.         . Multiple Vitamins-Minerals (OCUVITE PO)   Oral   Take 1 tablet by mouth daily.         . ranolazine (RANEXA) 500 MG 12 hr tablet   Oral   Take 1 tablet (500 mg total) by mouth 2 (two) times  daily.   112 tablet   0   . traMADol (ULTRAM) 50 MG tablet   Oral   Take 1 tablet (50 mg total) by mouth every 6 (six) hours as needed.   20 tablet   0    BP 169/102  Pulse 76  Temp(Src) 97.4 F (36.3 C) (Oral)  Resp 16  Ht 5\' 7"  (1.702 m)  Wt 150 lb (68.04 kg)  BMI 23.49 kg/m2  SpO2 97% Physical Exam  Nursing note and vitals reviewed. Constitutional: She appears well-developed and well-nourished. No distress.  HENT:  Head: Normocephalic and atraumatic.  Mouth/Throat: Oropharynx is clear and moist.  Eyes: EOM are normal. Pupils are equal, round, and reactive to light.  Neck: Normal range of motion.  Cardiovascular: Normal rate, regular rhythm, normal heart sounds and intact distal pulses.  Exam reveals no friction rub.   No murmur heard. Pulmonary/Chest: Breath sounds normal. She has no wheezes. She has no rales.  Abdominal: Bowel sounds are normal. There is no tenderness. There is no rebound.  Neurological: She is alert. No cranial nerve deficit. Coordination normal.  Skin: Skin is warm. There is erythema.  Sclerosed antecubital vein of the left arm  3 cm in size Area presents with warmth No direct linear streaking.   Psychiatric: She has a normal mood and affect. Her behavior is normal.    ED Course  Procedures (including critical care time) DIAGNOSTIC STUDIES: Oxygen Saturation is 97% on room air, normal by my interpretation.    COORDINATION OF CARE: 8:07 AM Discussed course of care with pt . Pt understands and agrees.  Labs Review Labs Reviewed - No data to display Imaging Review No results found.  EKG Interpretation   None       MDM   1. Thrombophlebitis    Left arm superficial thrombophlebitis. No evidence of deep pain thrombosis. No swelling to the forearm hand or wrist. Patient certainly has a component of cellulitis we'll treat with doxycycline, patient will return for any worse symptoms over the next 24 hours or if not improved in 48 hours.  Patient without any fever nontoxic no acute distress.  I personally performed the services described in this documentation, which was scribed in my presence. The recorded information has been reviewed and is accurate.    Shelda Jakes, MD 01/01/13 [phone]

## 2013-01-01 NOTE — ED Notes (Signed)
Pt w/ red and streaking up left arm, she stated she had a previous iv in place last week.

## 2013-01-07 ENCOUNTER — Encounter: Payer: Self-pay | Admitting: Physician Assistant

## 2013-01-07 ENCOUNTER — Ambulatory Visit (INDEPENDENT_AMBULATORY_CARE_PROVIDER_SITE_OTHER): Payer: Medicare Other | Admitting: Physician Assistant

## 2013-01-07 VITALS — BP 110/78 | HR 60 | Temp 97.1°F | Resp 14 | Wt 145.0 lb

## 2013-01-07 DIAGNOSIS — L0390 Cellulitis, unspecified: Secondary | ICD-10-CM

## 2013-01-07 DIAGNOSIS — L0291 Cutaneous abscess, unspecified: Secondary | ICD-10-CM

## 2013-01-07 DIAGNOSIS — I809 Phlebitis and thrombophlebitis of unspecified site: Secondary | ICD-10-CM

## 2013-01-07 MED ORDER — DOXYCYCLINE HYCLATE 100 MG PO TABS: 100.0000 mg | ORAL_TABLET | Freq: Two times a day (BID) | ORAL | Status: DC

## 2013-01-07 MED ORDER — SULFAMETHOXAZOLE-TMP DS 800-160 MG PO TABS: 1.0000 | ORAL_TABLET | Freq: Two times a day (BID) | ORAL | Status: DC

## 2013-01-07 NOTE — Progress Notes (Signed)
Patient ID: JAELLE CAMPANILE MRN: [id number], DOB: October 04, 1935, 77 y.o. Date of Encounter: 01/07/2013, 11:35 AM    Chief Complaint:  Chief Complaint  Patient presents with  . Follow up blood clot in left arm    Was seen at hospital -[facility]     HPI: 77 y.o. year old white female was admitted to [facility] 12/23/12 secondary to chest pain.  Subsequently she had to go to the emergency room on 01/01/13 secondary to cellulitis of the left forearm. At that time she reported a throbbing sensation the night prior to going to the emergency room. She explained the site was feeling extremely hot and painful. When seen in the ER on 01/01/2013 she was diagnosed with cellulitisof the left upper arm and treated with doxycycline. She also had sclerosed antecubital vein of the left arm.   She reports that she's been taking the doxycycline as directed. Says that she has one pill remaining. Says that over all the arm is much better. However she is concerned because she says that when she went to the ER on 01/01/13 the area with the most redness warmth and swelling was the medial aspect of her left upper arm. She says that area has now improved. However she is concerned because now the levelinferior to that, at the level of her elbow now--it is now pink, warm and slightly swollen. She is "afraid that the infection has spread."     Home Meds: See attached medication section for any medications that were entered at today's visit. The computer does not put those onto this list.The following list is a list of meds entered prior to today's visit.   Current Outpatient Prescriptions on File Prior to Visit  Medication Sig Dispense Refill  . aspirin 81 MG tablet Take 81 mg by mouth daily.      . Cyanocobalamin (VITAMIN B12 PO) Take 1 tablet by mouth daily.      Marland Kitchen doxycycline (VIBRAMYCIN) 100 MG capsule Take 1 capsule (100 mg total) by mouth 2 (two) times daily.  14 capsule  0  . fluticasone (FLONASE)  50 MCG/ACT nasal spray USE TWO SPRAY IN EACH NOSTRIL EVERY DAY  50 g  1  . ibuprofen (ADVIL,MOTRIN) 200 MG tablet Take 400 mg by mouth every 8 (eight) hours as needed for pain.      . metoprolol tartrate (LOPRESSOR) 25 MG tablet Take 12.5 mg by mouth 2 (two) times daily.      . Multiple Vitamins-Minerals (OCUVITE PO) Take 1 tablet by mouth daily.      . ranolazine (RANEXA) 500 MG 12 hr tablet Take 1 tablet (500 mg total) by mouth 2 (two) times daily.  112 tablet  0  . traMADol (ULTRAM) 50 MG tablet Take 1 tablet (50 mg total) by mouth every 6 (six) hours as needed.  20 tablet  0   No current facility-administered medications on file prior to visit.    Allergies:  Allergies  Allergen Reactions  . Chlorhexidine Itching  . Aspirin     REACTION: nose bleeds  . Atorvastatin     REACTION: muscle pain  . Ciprofloxacin     REACTION: nausea  . Clarithromycin   . Colestipol Hcl     REACTION: hallucinations  . Ezetimibe     REACTION: GI upset  . Penicillins     REACTION: rash  . Simvastatin     REACTION: muscle spasms  . Tape Rash      Review of  Systems: See HPI for pertinent ROS. All other ROS negative.    Physical Exam: Blood pressure 110/78, pulse 60, temperature 97.1 F (36.2 C), temperature source Oral, resp. rate 14, weight 145 lb (65.772 kg)., Body mass index is 22.71 kg/(m^2). General: WNWD WF.  Appears in no acute distress. Lungs: Clear bilaterally to auscultation without wheezes, rales, or rhonchi. Breathing is unlabored. Heart: Regular rhythm. No murmurs, rubs, or gallops. Msk:  Strength and tone normal for age. Extremities/Skin:  Left arm: Sclerosed antecubital vein of the left arm. Medial aspect of the elbow is a light pink color. It is slightly swollen and slightly warm. Neuro: Alert and oriented X 3. Moves all extremities spontaneously. Gait is normal. CNII-XII grossly in tact. Psych:  Responds to questions appropriately with a normal affect.     ASSESSMENT AND  PLAN:  77 y.o. year old female with  1. Phlebitis - doxycycline (VIBRA-TABS) 100 MG tablet; Take 1 tablet (100 mg total) by mouth 2 (two) times daily.  Dispense: 20 tablet; Refill: 0 - sulfamethoxazole-trimethoprim (BACTRIM DS) 800-160 MG per tablet; Take 1 tablet by mouth 2 (two) times daily.  Dispense: 20 tablet; Refill: 0  2. Cellulitis - doxycycline (VIBRA-TABS) 100 MG tablet; Take 1 tablet (100 mg total) by mouth 2 (two) times daily.  Dispense: 20 tablet; Refill: 0 - sulfamethoxazole-trimethoprim (BACTRIM DS) 800-160 MG per tablet; Take 1 tablet by mouth 2 (two) times daily.  Dispense: 20 tablet; Refill: 0  The doxycycline must have been working because over all the site is improved. However I think we need to add a second antibiotic for further coverage since she is developing and use site of cellulitis. I told her to take each antibiotic with crackers or toast and to separate the timing of these to decrease adverse effects. Followup if symptoms worsen or if they do not resolve with completion of these.  Signed, [address] [place], [place], [facility] 01/07/2013 11:35 AM

## 2013-01-08 ENCOUNTER — Telehealth: Payer: Self-pay | Admitting: *Deleted

## 2013-01-08 ENCOUNTER — Other Ambulatory Visit: Payer: Self-pay | Admitting: Family Medicine

## 2013-01-08 NOTE — Telephone Encounter (Signed)
The antibiotics listed on our allergy list are: Cipro Clarythromycin Penicillin  Bactrim is not on her allergy list here. Is pharmacy certain that she is truly allergic? If she is then we need to document  this on her allergy list.  If she is allergic to Bactrim then we will simply have her take the doxycycline by itself which I've already prescribed. If she does not have a history of allergic reaction to Bactrim then she needs to go ahead with the Bactrim and doxycycline together.

## 2013-01-08 NOTE — Telephone Encounter (Signed)
[facility] sent over a fax stating that pt has allergy to Bactrim DS wants to know if we can change to something else? Please advise!

## 2013-01-08 NOTE — Telephone Encounter (Signed)
Pt states that she is not sure why she can not take Bactrim as she is sensitive to a lot of meds but she does not remember why she cant take that. She did start the Doxy and her arm is feeling a lot better now so she will finish the Doxy. Will add Bactrim to list

## 2013-01-13 ENCOUNTER — Ambulatory Visit (HOSPITAL_COMMUNITY)
Admission: RE | Admit: 2013-01-13 | Discharge: 2013-01-13 | Disposition: A | Discharge status: Home / Self Care | Payer: Medicare Other | Source: Ambulatory Visit | Attending: Family Medicine | Admitting: Family Medicine

## 2013-01-13 NOTE — Progress Notes (Signed)
Physical Therapy Treatment Patient Details  Name: Lauren House MRN: [id number] Date of Birth: December 21, 1935  Today's Date: 01/13/2013 Time: 2130-8657 PT Time Calculation (min): 40 min  Visit#: 4 of 8  Re-eval: 01/15/13 Authorization: BCBS Medicare  Authorization Visit#: 4 of 10  Charges:  [place] [phone] (8'), manual [phone] (15'), therex [phone] (10')  Subjective: Symptoms/Limitations Symptoms: Pt states last treatment really helped and her hip is getting better overall.  States she's been in/out of ED due to infection in her arm from an IV site and that is why she has missed the last cpl weeks.  Pain Assessment Currently in Pain?: Yes Pain Score: 3  Pain Location: Hip Pain Orientation: Left   Exercise/Treatments Stretches Passive Hamstring Stretch: 2 reps;60 seconds Piriformis Stretch: 2 reps;60 seconds passively by therapist, left Hip rotators stretch passively by therapist 2 reps; 60 seconds, left Knee to chest stretch passively by therapist 2 reps; 60 seconds, left   Modalities Modalities: Ultrasound (performed by Nicholes Rough, PTT with therapist supervision) Manual Therapy Manual Therapy: Myofascial release Myofascial Release: after [place] to decrease fascial restrictions with STM/DTM to Lt gluteal, TFL and ITB Ultrasound Ultrasound Location: Lt greater trochanter in Rt side lying position Ultrasound Parameters: 1.0 w/cm2 pulsed with X 8 minutes Ultrasound Goals: Pain  Physical Therapy Assessment and Plan PT Assessment and Plan Clinical Impression Statement: continued to focus on decreasing fascial restrictions and increasing ROM/mobility of Lt hip/LE.  Performed all stretching to pt passively today due to pain in UE from IV site infection.  Pt encouraged to resume stretching at home due to extreme tightness in hip/hamstring. PT Plan: May add back standing and mat activities to improve LE and gluteal strength.    Re-eval next visit for 4 week progress.   Problem  List Patient Active Problem List   Diagnosis Date Noted  . Angina pectoris, variant 12/24/2012  . Left hip pain 12/16/2012  . Bursitis of left hip 12/16/2012  . Osteoarthritis, hip, bilateral 12/16/2012  . Chest pain 08/15/2012  . Apical variant hypertrophic cardiomyopathy 08/15/2012  . Bilateral inguinal hernia, right symptomatic 09/21/2011  . COMEDO 10/18/2008  . POSTMENOPAUSAL OSTEOPOROSIS 09/01/2008  . HIP PAIN, LEFT 01/19/2008  . CARCINOMA, BASAL CELL, SKIN 10/02/2006  . VARICOSE VEIN 09/24/2006  . ACTINIC KERATOSIS 09/24/2006  . NECK PAIN 09/24/2006  . BACK PAIN 09/24/2006  . NEOP, BNG, SCALP/SKIN, NECK 04/16/2006  . FIBROIDS, UTERUS 04/16/2006  . HYPERLIPIDEMIA 04/16/2006  . MACULAR DEGENERATION 04/16/2006  . HYPERTENSION 04/16/2006  . ALLERGIC RHINITIS 04/16/2006  . OVERACTIVE BLADDER 04/16/2006  . URINARY INCONTINENCE 04/16/2006  . SKIN CANCER, HX OF 04/16/2006  . VERTIGO, HX OF 04/16/2006  . RENAL CALCULUS, HX OF 04/16/2006    PT - End of Session Activity Tolerance: Patient tolerated treatment well General Behavior During Therapy: WFL for tasks assessed/performed   Lurena Nida, PTA/CLT 01/13/2013, 11:20 AM

## 2013-01-14 ENCOUNTER — Encounter: Payer: Self-pay | Admitting: Internal Medicine

## 2013-01-14 ENCOUNTER — Ambulatory Visit (INDEPENDENT_AMBULATORY_CARE_PROVIDER_SITE_OTHER): Payer: Medicare Other | Admitting: Internal Medicine

## 2013-01-14 VITALS — BP 152/92 | HR 64 | Ht 67.0 in | Wt 146.4 lb

## 2013-01-14 DIAGNOSIS — I422 Other hypertrophic cardiomyopathy: Secondary | ICD-10-CM

## 2013-01-14 DIAGNOSIS — R079 Chest pain, unspecified: Secondary | ICD-10-CM

## 2013-01-14 DIAGNOSIS — I428 Other cardiomyopathies: Secondary | ICD-10-CM

## 2013-01-14 DIAGNOSIS — I868 Varicose veins of other specified sites: Secondary | ICD-10-CM

## 2013-01-14 DIAGNOSIS — I201 Angina pectoris with documented spasm: Secondary | ICD-10-CM

## 2013-01-14 MED ORDER — RANOLAZINE ER 500 MG PO TB12: 500.0000 mg | ORAL_TABLET | Freq: Two times a day (BID) | ORAL | Status: DC

## 2013-01-14 MED ORDER — NITROGLYCERIN 0.4 MG SL SUBL: 0.4000 mg | SUBLINGUAL_TABLET | SUBLINGUAL | Status: DC | PRN

## 2013-01-14 NOTE — Patient Instructions (Signed)
Your physician has requested that you have a lexiscan myoview. For further information please visit [email or web address] Please follow instruction sheet, as given.  Your physician recommends that you schedule a follow-up appointment in a few weeks, after your stress test.   We have sent refills of nitroglycerin to your pharmacy & provided ranexa samples today.

## 2013-01-14 NOTE — Progress Notes (Signed)
OFFICE NOTE  Chief Complaint:  Routine followup  Primary Care Physician: Leo Grosser, MD  HPI:  Lauren House is a pleasant 77 year old female I have been following for history of apical hypertrophic cardiomyopathy for which she has done well on Ranexa 500 mg daily. Recently, she was having right inguinal pain and had a hernia for which she had repair. That, unfortunately, she says has caused her more harm than good and she continues to have more pain in her right hip area which possibly is related to the hip. She saw Dr. Lequita Halt at [facility] for further evaluation from that and has received 2 hip injections which have been beneficial. He did mention that he felt she might need a hip replacement. Recently she had a recurrent episode of chest pain. This caused her to the hospital and improved with nitroglycerin. She was ruled out for MI and discharged. Unfortunately she returned several days later after she developed swelling, warmth and pain in her left forearm. It was felt that she might have possibly had an infected IV site and clearly had superficial thrombophlebitis. She was placed on antibiotics for this and is being treated with some improvement in her symptoms. After her emergency room visit, it was recommended that she increase her Ranexa 500 mg twice daily which is the recommended starting dose. She's had no further chest pain episodes. She had prior cardiac catheterization in 2011 which demonstrated a 50-60% LAD lesion. She underwent FFR for this which was negative with an FFR of 0.99. She had had a stress test at that time which indicated no evidence of reversible ischemia., but was having persistent chest pain.  It is been felt that her chest pain was due to small vessel disease in the setting of significant apical variant hypertrophic cardiomyopathy. This has been treated most effectively with ranolazine, and nitrates to some extent. Although her symptoms now have  escalated. This is concerning to me for possible worsening of her LAD disease.  PMHx:  Past Medical History  Diagnosis Date  . Essential hypertension, benign   . Osteoporosis   . Arthritis   . Hyperlipidemia     Diet controlled.cannot take meds  . Apical variant hypertrophic cardiomyopathy   . Anxiety   . History of kidney stones   . Vertigo   . History of skin cancer     Basal cell - abdomen  . Nocturia   . Borderline diabetes mellitus   . Coronary atherosclerosis of native coronary artery     Nonobstructive - 50-60% LAD (normal FFR) at catheterization December 2011  . Angina pectoris, variant     Likely microvascular    Past Surgical History  Procedure Laterality Date  . Abdominal hysterectomy    . Hemorrhoid surgery    . Eye surgery      cataracts bilateral  . Inguinal hernia repair  10/11/2011    Procedure: HERNIA REPAIR INGUINAL ADULT;  Surgeon: Almond Lint, MD;  Location: [facility] OR;  Service: General;  Laterality: Right;    FAMHx:  Family History  Problem Relation Age of Onset  . Heart disease Father   . Lung cancer Sister   . Lung cancer Brother     SOCHx:   reports that she has never smoked. She has never used smokeless tobacco. She reports that she does not drink alcohol or use illicit drugs.  ALLERGIES:  Allergies  Allergen Reactions  . Chlorhexidine Itching  . Aspirin     REACTION: nose bleeds  .  Atorvastatin     REACTION: muscle pain  . Bactrim [Sulfamethoxazole-Tmp Ds]   . Ciprofloxacin     REACTION: nausea  . Clarithromycin   . Colestipol Hcl     REACTION: hallucinations  . Ezetimibe     REACTION: GI upset  . Penicillins     REACTION: rash  . Simvastatin     REACTION: muscle spasms  . Tape Rash    ROS: A comprehensive review of systems was negative except for: Cardiovascular: positive for chest pain and palpitations Musculoskeletal: positive for Right hip pain  HOME MEDS: Current Outpatient Prescriptions  Medication Sig Dispense  Refill  . aspirin 81 MG tablet Take 81 mg by mouth daily.      . Cyanocobalamin (VITAMIN B12 PO) Take 1 tablet by mouth daily.      Marland Kitchen doxycycline (VIBRA-TABS) 100 MG tablet Take 1 tablet (100 mg total) by mouth 2 (two) times daily.  20 tablet  0  . fluticasone (FLONASE) 50 MCG/ACT nasal spray USE TWO SPRAY IN EACH NOSTRIL EVERY DAY  50 g  1  . ibuprofen (ADVIL,MOTRIN) 200 MG tablet Take 400 mg by mouth every 8 (eight) hours as needed for pain.      . metoprolol tartrate (LOPRESSOR) 25 MG tablet Take 12.5 mg by mouth 2 (two) times daily.      . Multiple Vitamins-Minerals (OCUVITE PO) Take 1 tablet by mouth daily.      . nitroGLYCERIN (NITROSTAT) 0.4 MG SL tablet Place 1 tablet (0.4 mg total) under the tongue every 5 (five) minutes as needed for chest pain.  25 tablet  3  . ranolazine (RANEXA) 500 MG 12 hr tablet Take 1 tablet (500 mg total) by mouth 2 (two) times daily.  56 tablet  0   No current facility-administered medications for this visit.    LABS/IMAGING: No results found for this or any previous visit (from the past 48 hour(s)). No results found.  VITALS: BP 152/92  Pulse 64  Ht 5\' 7"  (1.702 m)  Wt 146 lb 6.4 oz (66.407 kg)  BMI 22.92 kg/m2  EXAM: General appearance: alert and no distress Neck: no adenopathy, no carotid bruit, no JVD, supple, symmetrical, trachea midline and thyroid not enlarged, symmetric, no tenderness/mass/nodules Lungs: clear to auscultation bilaterally Heart: regular rate and rhythm, S1, S2 normal, no murmur, click, rub or gallop Abdomen: soft, non-tender; bowel sounds normal; no masses,  no organomegaly Extremities: extremities normal, atraumatic, no cyanosis or edema Pulses: 2+ and symmetric Skin: Skin color, texture, turgor normal. No rashes or lesions Neurologic: Grossly normal  EKG: Normal sinus rhythm at 59 with deep T-wave inversions in V2 through V5, consistent with apical hypertrophic cardiomyopathy  ASSESSMENT: 1. Apical variant  hypertrophic retinopathy - anterolateral TWI's (would make stress EKG interpretation impossible) 2. Chest pain - known 50-60% LAD stenosis (FFR 0.99 in 2011) 3. Hypertension 4. Venous insufficiency 5. Right inguinal pain with a hernia, status post surgical repair 6. Right hip pain with degenerative hip disease  PLAN: 1.   Mrs. Driskill is now presenting with worsening chest pain that seems to be nitrate responsive. She increased her Ranexa 500 mg twice daily and is doing better. Her EKG continues to show anterolateral T-wave inversions consistent with apical variant hypertrophic cardiomyopathy. It is impossible to assess whether she is having any ischemia based on her EKG. Her last stress test was negative for ischemia in 2011 and this correlated with a negative FFR despite the fact that she had 50-60% LAD stenosis.  She could very well have progression of her LAD stenosis over the past 3 years. I would recommend a repeat lexiscan nuclear stress test to evaluate for ischemia. If abnormal she may need repeat cardiac catheterization.  For now continue to encourage her to take her Ranexa twice daily and nitroglycerin as needed.  Plan to see her back to discuss the results of her stress test in 1-2 weeks.  Chrystie Nose, MD, [facility] Attending Cardiologist [facility]  HILTY,Kenneth C 01/14/2013, 12:23 PM

## 2013-01-15 ENCOUNTER — Ambulatory Visit (HOSPITAL_COMMUNITY)
Admission: RE | Admit: 2013-01-15 | Discharge: 2013-01-15 | Disposition: A | Discharge status: Home / Self Care | Payer: Medicare Other | Source: Ambulatory Visit | Attending: Family Medicine | Admitting: Family Medicine

## 2013-01-15 DIAGNOSIS — M160 Bilateral primary osteoarthritis of hip: Secondary | ICD-10-CM

## 2013-01-15 DIAGNOSIS — M25552 Pain in left hip: Secondary | ICD-10-CM

## 2013-01-15 DIAGNOSIS — M7072 Other bursitis of hip, left hip: Secondary | ICD-10-CM

## 2013-01-15 NOTE — Progress Notes (Signed)
Physical Therapy Treatment Patient Details  Name: Lauren House MRN: [id number] Date of Birth: July 30, 1935  Today's Date: 01/15/2013 Time: 4098-1191 PT Time Calculation (min): 40 min Charges:  TE: [phone] Manual: [phone] [place]: [phone] Visit#: 5 of 8  Re-eval: 01/15/13 Assessment Diagnosis: Lt hip pain Next MD Visit: Dr. Despina Hick - 02/10/13  Authorization: [employer] Medicare  Authorization Time Period:    Authorization Visit#: 5 of 10   Subjective: Symptoms/Limitations Symptoms: Pt reports she has not had time to do a lot of her exercises at home.  She is feeling a little nauseated from her medication this morning.  Pain Assessment Currently in Pain?: Yes Pain Score: 3  Pain Location: Hip Pain Orientation: Left Pain Type: Acute pain  Precautions/Restrictions     Exercise/Treatments  Standing Side Lunges: Both;10 reps Hip ADduction: Both;10 reps;Limitations Hip ADduction Limitations: Hip extension: Both, 10 reps Lateral Step Up: Both;10 reps;Hand Hold: 2;Step Height: 4" Forward Step Up: Both;10 reps;Hand Hold: 1;Step Height: 4"  Modalities Modalities: Ultrasound Manual Therapy Manual Therapy: Myofascial release Myofascial Release: Rt s/l position: decrease fascial restrictions with Soft tissue massage and deep tissue Massage to Lt gluteal, TFL and ITBtaken  Ultrasound Ultrasound Location: Lt greater trochanter in Rt side lying position Ultrasound Parameters: 1.0 w/cm2 pulsed with X 8 minutes Ultrasound Goals: Pain  Physical Therapy Assessment and Plan PT Assessment and Plan Clinical Impression Statement: Added standing exercises pt will be able to complete at home with greater ease for improved complaince.  Pt reports decreased pain after manual and [place] today.  PT Plan: Add balance activities ( retro gait, side stepping and tandem gait, SLS)    Goals Home Exercise Program Pt/caregiver will Perform Home Exercise Program: Independently PT Goal: Perform Home Exercise  Program - Progress: Progressing toward goal PT Short Term Goals Time to Complete Short Term Goals: 2 weeks PT Short Term Goal 1: Pt will improve LLE flexibility to [facility] in order to decrease pain to left hip to a 1/10 when sleeping on her left side.  PT Short Term Goal 1 - Progress: Progressing toward goal PT Short Term Goal 2: Pt will improve her LLE strength by 1/2 muscle grade for improved gait mechanics.  PT Short Term Goal 2 - Progress: Progressing toward goal PT Short Term Goal 3: Pt will improve her proriocetive awareness and demonstrate Lt and Rt SLS x15 seconds on solid surface to decrease risk of falls.  PT Short Term Goal 3 - Progress: Progressing toward goal PT Long Term Goals Time to Complete Long Term Goals: 4 weeks PT Long Term Goal 1: Pt will be educated in an adavanced HEP to address balance and core strength.` PT Long Term Goal 1 - Progress: Progressing toward goal PT Long Term Goal 2: Pt will be independent with core coordination and strength for improved core stability to decrease risk of falls.  PT Long Term Goal 2 - Progress: Progressing toward goal Long Term Goal 3: Pt will improve her FOTO to Status greater than 64% and Limitation less than 36% for improved percieved functional ability.  Long Term Goal 3 Progress: Progressing toward goal  Problem List Patient Active Problem List   Diagnosis Date Noted  . Angina pectoris, variant 12/24/2012  . Left hip pain 12/16/2012  . Bursitis of left hip 12/16/2012  . Osteoarthritis, hip, bilateral 12/16/2012  . Chest pain 08/15/2012  . Apical variant hypertrophic cardiomyopathy 08/15/2012  . Bilateral inguinal hernia, right symptomatic 09/21/2011  . COMEDO 10/18/2008  . POSTMENOPAUSAL OSTEOPOROSIS 09/01/2008  .  HIP PAIN, LEFT 01/19/2008  . CARCINOMA, BASAL CELL, SKIN 10/02/2006  . VARICOSE VEIN 09/24/2006  . ACTINIC KERATOSIS 09/24/2006  . NECK PAIN 09/24/2006  . BACK PAIN 09/24/2006  . NEOP, BNG, SCALP/SKIN, NECK  04/16/2006  . FIBROIDS, UTERUS 04/16/2006  . HYPERLIPIDEMIA 04/16/2006  . MACULAR DEGENERATION 04/16/2006  . HYPERTENSION 04/16/2006  . ALLERGIC RHINITIS 04/16/2006  . OVERACTIVE BLADDER 04/16/2006  . URINARY INCONTINENCE 04/16/2006  . SKIN CANCER, HX OF 04/16/2006  . VERTIGO, HX OF 04/16/2006  . RENAL CALCULUS, HX OF 04/16/2006    PT - End of Session Activity Tolerance: Patient tolerated treatment well General Behavior During Therapy: [facility] for tasks assessed/performed PT Plan of Care PT Home Exercise Plan: updated  GP    Jayveon Convey, MPT, ATC 01/15/2013, 9:55 AM

## 2013-01-20 ENCOUNTER — Encounter (HOSPITAL_COMMUNITY): Payer: Medicare Other

## 2013-01-20 ENCOUNTER — Telehealth (HOSPITAL_COMMUNITY): Payer: Self-pay

## 2013-01-20 ENCOUNTER — Inpatient Hospital Stay (HOSPITAL_COMMUNITY): Admission: RE | Admit: 2013-01-20 | Payer: Medicare Other | Source: Ambulatory Visit

## 2013-01-27 ENCOUNTER — Ambulatory Visit (HOSPITAL_COMMUNITY): Payer: Medicare Other | Admitting: Physical Therapy

## 2013-01-29 ENCOUNTER — Ambulatory Visit (HOSPITAL_COMMUNITY): Payer: Medicare Other | Admitting: Physical Therapy

## 2013-02-02 ENCOUNTER — Telehealth (HOSPITAL_COMMUNITY): Payer: Self-pay | Admitting: *Deleted

## 2013-02-03 ENCOUNTER — Ambulatory Visit (HOSPITAL_COMMUNITY)
Admission: RE | Admit: 2013-02-03 | Discharge: 2013-02-03 | Disposition: A | Discharge status: Home / Self Care | Payer: Medicare Other | Source: Ambulatory Visit | Attending: Cardiovascular Disease | Admitting: Cardiovascular Disease

## 2013-02-03 DIAGNOSIS — R0609 Other forms of dyspnea: Secondary | ICD-10-CM | POA: Insufficient documentation

## 2013-02-03 DIAGNOSIS — I2510 Atherosclerotic heart disease of native coronary artery without angina pectoris: Secondary | ICD-10-CM | POA: Insufficient documentation

## 2013-02-03 DIAGNOSIS — R002 Palpitations: Secondary | ICD-10-CM | POA: Insufficient documentation

## 2013-02-03 DIAGNOSIS — Z8249 Family history of ischemic heart disease and other diseases of the circulatory system: Secondary | ICD-10-CM | POA: Insufficient documentation

## 2013-02-03 DIAGNOSIS — I422 Other hypertrophic cardiomyopathy: Secondary | ICD-10-CM | POA: Insufficient documentation

## 2013-02-03 DIAGNOSIS — R42 Dizziness and giddiness: Secondary | ICD-10-CM | POA: Insufficient documentation

## 2013-02-03 DIAGNOSIS — I872 Venous insufficiency (chronic) (peripheral): Secondary | ICD-10-CM | POA: Insufficient documentation

## 2013-02-03 DIAGNOSIS — R079 Chest pain, unspecified: Secondary | ICD-10-CM

## 2013-02-03 DIAGNOSIS — R5381 Other malaise: Secondary | ICD-10-CM | POA: Insufficient documentation

## 2013-02-03 DIAGNOSIS — I10 Essential (primary) hypertension: Secondary | ICD-10-CM | POA: Insufficient documentation

## 2013-02-03 DIAGNOSIS — R0989 Other specified symptoms and signs involving the circulatory and respiratory systems: Secondary | ICD-10-CM | POA: Insufficient documentation

## 2013-02-03 MED ORDER — TECHNETIUM TC 99M SESTAMIBI GENERIC - CARDIOLITE
30.0000 | Freq: Once | INTRAVENOUS | Status: AC | PRN
  Administered 2013-02-03: 30 via INTRAVENOUS

## 2013-02-03 MED ORDER — TECHNETIUM TC 99M SESTAMIBI GENERIC - CARDIOLITE
10.0000 | Freq: Once | INTRAVENOUS | Status: AC | PRN
  Administered 2013-02-03: 10 via INTRAVENOUS

## 2013-02-03 MED ORDER — AMINOPHYLLINE 25 MG/ML IV SOLN
75.0000 mg | Freq: Once | INTRAVENOUS | Status: AC
  Administered 2013-02-03: 75 mg via INTRAVENOUS

## 2013-02-03 MED ORDER — REGADENOSON 0.4 MG/5ML IV SOLN
0.4000 mg | Freq: Once | INTRAVENOUS | Status: AC
  Administered 2013-02-03: 0.4 mg via INTRAVENOUS

## 2013-02-03 NOTE — Procedures (Addendum)
[facility] [facility] [address] [place] [address] [phone]  Cardiology Nuclear Med Study  SERENNA House is a 77 y.o. female     MRN : [id number]     DOB: 03/28/1935  Procedure Date: 02/03/2013  Nuclear Med Background Indication for Stress Test:  Evaluation for Ischemia, Post Hospital and Abnormal EKG History:  CAD;Apical Hypertrophic Cardiomyopathy Cardiac Risk Factors: Family History - CAD, Hypertension, Lipids and Venous Insufficiency  Symptoms:  Chest Pain, Dizziness, DOE, Fatigue, Light-Headedness and Palpitations   Nuclear Pre-Procedure Caffeine/Decaff Intake:  7:00pm NPO After: 5:00am   IV Site: R Forearm  IV 0.9% NS with Angio Cath:  22g  Chest Size (in):  n/a IV Started by: Emmit Pomfret, RN  Height: 5\' 7"  (1.702 m)  Cup Size: A  BMI:  Body mass index is 22.86 kg/(m^2). Weight:  146 lb (66.225 kg)   Tech Comments:  n/a    Nuclear Med Study 1 or 2 day study: 1 day  Stress Test Type:  Lexiscan  Order Authorizing Provider:  Zoila Shutter, MD   Resting Radionuclide: Technetium 48m Sestamibi  Resting Radionuclide Dose: 10.4 mCi   Stress Radionuclide:  Technetium 33m Sestamibi  Stress Radionuclide Dose: 29.5 mCi           Stress Protocol Rest HR: 68 Stress HR: 94  Rest BP: 172/104 Stress BP: 180/95  Exercise Time (min): n/a METS: n/a          Dose of Adenosine (mg):  n/a Dose of Lexiscan: 0.4 mg  Dose of Atropine (mg): n/a Dose of Dobutamine: n/a mcg/kg/min (at max HR)  Stress Test Technologist: Ernestene Mention, CCT Nuclear Technologist: Koren Shiver, CNMT   Rest Procedure:  Myocardial perfusion imaging was performed at rest 45 minutes following the intravenous administration of Technetium 86m Sestamibi. Stress Procedure:  The patient received IV Lexiscan 0.4 mg over 15-seconds.  Technetium 59m Sestamibi injected at 30-seconds.  Due to patient's shortness of breath, head pain and nausea, she was given IV  Aminophylline 75 mg. Symptoms were resolved during recovery. There were no significant changes with Lexiscan.  Quantitative spect images were obtained after a 45 minute delay.  Transient Ischemic Dilatation (Normal <1.22):  1.06 Lung/Heart Ratio (Normal <0.45):  0.22 QGS EDV:  68 ml QGS ESV:  33 ml LV Ejection Fraction: 52%    Rest ECG: NSR-LVH  Stress ECG: No significant change from baseline ECG  QPS Raw Data Images:  Normal; no motion artifact; normal heart/lung ratio. Stress Images:  Normal homogeneous uptake in all areas of the myocardium. Rest Images:  Normal homogeneous uptake in all areas of the myocardium. Subtraction (SDS):  No evidence of ischemia. LV Wall Motion:  NL LV Function; NL Wall Motion  Impression Exercise Capacity:  Lexiscan with no exercise. BP Response:  Normal blood pressure response. Clinical Symptoms:  No significant symptoms noted. ECG Impression:  No significant ECG changes with Lexiscan. Comparison with Prior Nuclear Study: No significant change from previous study   Overall Impression:  Normal stress nuclear study.   Thurmon Fair, MD  02/03/2013 1:22 PM

## 2013-02-04 ENCOUNTER — Ambulatory Visit: Payer: Medicare Other | Admitting: Internal Medicine

## 2013-02-10 ENCOUNTER — Encounter: Payer: Self-pay | Admitting: Internal Medicine

## 2013-02-10 ENCOUNTER — Ambulatory Visit (INDEPENDENT_AMBULATORY_CARE_PROVIDER_SITE_OTHER): Payer: Medicare Other | Admitting: Internal Medicine

## 2013-02-10 VITALS — BP 157/86 | HR 72 | Ht 67.0 in | Wt 145.1 lb

## 2013-02-10 DIAGNOSIS — I428 Other cardiomyopathies: Secondary | ICD-10-CM

## 2013-02-10 DIAGNOSIS — R079 Chest pain, unspecified: Secondary | ICD-10-CM

## 2013-02-10 DIAGNOSIS — I201 Angina pectoris with documented spasm: Secondary | ICD-10-CM

## 2013-02-10 DIAGNOSIS — I422 Other hypertrophic cardiomyopathy: Secondary | ICD-10-CM

## 2013-02-10 NOTE — Patient Instructions (Signed)
Your physician wants you to follow-up in:  6 months. You will receive a reminder letter in the mail two months in advance. If you don't receive a letter, please call our office to schedule the follow-up appointment.   

## 2013-02-10 NOTE — Progress Notes (Signed)
OFFICE NOTE  Chief Complaint:  Routine followup  Primary Care Physician: Leo Grosser, MD  HPI:  Lauren House is a pleasant 77 year old female I have been following for history of apical hypertrophic cardiomyopathy for which she has done well on Ranexa 500 mg daily. Recently, she was having right inguinal pain and had a hernia for which she had repair. That, unfortunately, she says has caused her more harm than good and she continues to have more pain in her right hip area which possibly is related to the hip. She saw Dr. Lequita Halt at [facility] for further evaluation from that and has received 2 hip injections which have been beneficial. He did mention that he felt she might need a hip replacement. Recently she had a recurrent episode of chest pain. This caused her to the hospital and improved with nitroglycerin. She was ruled out for MI and discharged. Unfortunately she returned several days later after she developed swelling, warmth and pain in her left forearm. It was felt that she might have possibly had an infected IV site and clearly had superficial thrombophlebitis. She was placed on antibiotics for this and is being treated with some improvement in her symptoms. After her emergency room visit, it was recommended that she increase her Ranexa 500 mg twice daily which is the recommended starting dose. She's had no further chest pain episodes. She had prior cardiac catheterization in 2011 which demonstrated a 50-60% LAD lesion. She underwent FFR for this which was negative with an FFR of 0.99. She had had a stress test at that time which indicated no evidence of reversible ischemia., but was having persistent chest pain.  It is been felt that her chest pain was due to small vessel disease in the setting of significant apical variant hypertrophic cardiomyopathy. This has been treated most effectively with ranolazine, and nitrates to some extent. Although her symptoms now have  escalated. This is concerning to me for possible worsening of her LAD disease.  Lauren House underwent a nuclear stress test on 02/03/2013. This was negative for ischemia with an EF of 52%. Since then she has reported some improvement in her symptoms. Her main complaints now have to do with left hip pain and she is scheduled for an MRI on 12/24.  PMHx:  Past Medical History  Diagnosis Date  . Essential hypertension, benign   . Osteoporosis   . Arthritis   . Hyperlipidemia     Diet controlled.cannot take meds  . Apical variant hypertrophic cardiomyopathy   . Anxiety   . History of kidney stones   . Vertigo   . History of skin cancer     Basal cell - abdomen  . Nocturia   . Borderline diabetes mellitus   . Coronary atherosclerosis of native coronary artery     Nonobstructive - 50-60% LAD (normal FFR) at catheterization December 2011  . Angina pectoris, variant     Likely microvascular    Past Surgical History  Procedure Laterality Date  . Abdominal hysterectomy    . Hemorrhoid surgery    . Eye surgery      cataracts bilateral  . Inguinal hernia repair  10/11/2011    Procedure: HERNIA REPAIR INGUINAL ADULT;  Surgeon: Almond Lint, MD;  Location: [facility] OR;  Service: General;  Laterality: Right;    FAMHx:  Family History  Problem Relation Age of Onset  . Heart disease Father   . Lung cancer Sister   . Lung cancer Brother  SOCHx:   reports that she has never smoked. She has never used smokeless tobacco. She reports that she does not drink alcohol or use illicit drugs.  ALLERGIES:  Allergies  Allergen Reactions  . Chlorhexidine Itching  . Aspirin     REACTION: nose bleeds with full strength  . Atorvastatin     REACTION: muscle pain  . Bactrim [Sulfamethoxazole-Tmp Ds]   . Ciprofloxacin     REACTION: nausea  . Clarithromycin   . Colestipol Hcl     REACTION: hallucinations  . Ezetimibe     REACTION: GI upset  . Penicillins     REACTION: rash  . Simvastatin      REACTION: muscle spasms  . Tape Rash    ROS: A comprehensive review of systems was negative except for: Cardiovascular: positive for chest pain and palpitations Musculoskeletal: positive for Left hip pain  HOME MEDS: Current Outpatient Prescriptions  Medication Sig Dispense Refill  . aspirin 81 MG tablet Take 81 mg by mouth daily.      . Cyanocobalamin (VITAMIN B12 PO) Take 1 tablet by mouth daily.      Marland Kitchen doxycycline (VIBRA-TABS) 100 MG tablet Take 1 tablet (100 mg total) by mouth 2 (two) times daily.  20 tablet  0  . fluticasone (FLONASE) 50 MCG/ACT nasal spray USE TWO SPRAY IN EACH NOSTRIL EVERY DAY  50 g  1  . ibuprofen (ADVIL,MOTRIN) 200 MG tablet Take 400 mg by mouth every 8 (eight) hours as needed for pain.      . metoprolol tartrate (LOPRESSOR) 25 MG tablet Take 12.5 mg by mouth 2 (two) times daily.      . Multiple Vitamins-Minerals (OCUVITE PO) Take 1 tablet by mouth daily.      . nitroGLYCERIN (NITROSTAT) 0.4 MG SL tablet Place 1 tablet (0.4 mg total) under the tongue every 5 (five) minutes as needed for chest pain.  25 tablet  3  . ranolazine (RANEXA) 500 MG 12 hr tablet Take 1 tablet (500 mg total) by mouth 2 (two) times daily.  56 tablet  0   No current facility-administered medications for this visit.    LABS/IMAGING: No results found for this or any previous visit (from the past 48 hour(s)). No results found.  VITALS: BP 157/86  Pulse 72  Ht 5\' 7"  (1.702 m)  Wt 145 lb 1.6 oz (65.817 kg)  BMI 22.72 kg/m2  EXAM: deferred  EKG: deferred  ASSESSMENT: 1. Apical variant hypertrophic retinopathy - anterolateral TWI's - negative nuclear stress test 01-2013 2. Chest pain - known 50-60% LAD stenosis (FFR 0.99 in 2011) 3. Hypertension 4. Venous insufficiency 5. Right inguinal pain with a hernia, status post surgical repair 6. Right hip pain with degenerative hip disease -low risk for surgery  PLAN: 1.   Lauren House had a negative nuclear stress test, although some  of her symptoms were concerning for ischemia. She has a known 50-60% LAD stenosis, but that does not appear to have progressed. He is also now complaining of hip pain and is undergoing an MRI in a few weeks. Should she require surgery, I feel that she would be low risk based on the stress testing and no further evaluation would be necessary. Plan to see her back in 6 months.  Chrystie Nose, MD, [facility] Attending Cardiologist [facility]  HILTY,Kenneth C 02/10/2013, 12:48 PM

## 2013-02-24 ENCOUNTER — Other Ambulatory Visit: Payer: Self-pay | Admitting: Internal Medicine

## 2013-02-24 ENCOUNTER — Other Ambulatory Visit: Payer: Self-pay | Admitting: *Deleted

## 2013-02-24 MED ORDER — RANOLAZINE ER 500 MG PO TB12: 500.0000 mg | ORAL_TABLET | Freq: Two times a day (BID) | ORAL | Status: DC

## 2013-02-24 NOTE — Telephone Encounter (Signed)
Rx was sent to pharmacy electronically. 

## 2013-07-09 ENCOUNTER — Ambulatory Visit (INDEPENDENT_AMBULATORY_CARE_PROVIDER_SITE_OTHER): Payer: Medicare Other | Admitting: Physician Assistant

## 2013-07-09 ENCOUNTER — Encounter: Payer: Self-pay | Admitting: Physician Assistant

## 2013-07-09 VITALS — BP 144/86 | HR 60 | Temp 97.4°F | Resp 18 | Wt 145.0 lb

## 2013-07-09 DIAGNOSIS — W57XXXA Bitten or stung by nonvenomous insect and other nonvenomous arthropods, initial encounter: Secondary | ICD-10-CM

## 2013-07-09 DIAGNOSIS — T148 Other injury of unspecified body region: Secondary | ICD-10-CM

## 2013-07-09 MED ORDER — DOXYCYCLINE HYCLATE 100 MG PO TABS: 100.0000 mg | ORAL_TABLET | Freq: Two times a day (BID) | ORAL | Status: DC

## 2013-07-09 NOTE — Progress Notes (Signed)
Patient ID: Lauren House MRN: [id number], DOB: 1935-11-23, 78 y.o. Date of Encounter: 07/09/2013, 12:35 PM    Chief Complaint:  Chief Complaint  Patient presents with  . tick bite right fore arm     HPI: 78 y.o. year old white female reports that she found a tick on her left forearm yesterday morning. Says it at that at that time the surrounding skin appeared normal. She says that this morning there was some redness and swelling around the site so she thought she better come get it checked.  She has noticed no fevers or chills. No headache. No myalgias. No malaise. She has had no other areas of rash on any other areas of her skin.     Home Meds: See attached medication section for any medications that were entered at today's visit. The computer does not put those onto this list.The following list is a list of meds entered prior to today's visit.   Current Outpatient Prescriptions on File Prior to Visit  Medication Sig Dispense Refill  . aspirin 81 MG tablet Take 81 mg by mouth daily.      . Cyanocobalamin (VITAMIN B12 PO) Take 1 tablet by mouth daily.      . fluticasone (FLONASE) 50 MCG/ACT nasal spray USE TWO SPRAY IN EACH NOSTRIL EVERY DAY  50 g  1  . ibuprofen (ADVIL,MOTRIN) 200 MG tablet Take 400 mg by mouth every 8 (eight) hours as needed for pain.      . metoprolol tartrate (LOPRESSOR) 25 MG tablet TAKE ONE-HALF TABLET BY MOUTH TWICE DAILY  30 tablet  11  . Multiple Vitamins-Minerals (OCUVITE PO) Take 1 tablet by mouth daily.      . nitroGLYCERIN (NITROSTAT) 0.4 MG SL tablet Place 1 tablet (0.4 mg total) under the tongue every 5 (five) minutes as needed for chest pain.  25 tablet  3  . ranolazine (RANEXA) 500 MG 12 hr tablet Take 1 tablet (500 mg total) by mouth 2 (two) times daily.  60 tablet  11   No current facility-administered medications on file prior to visit.    Allergies:  Allergies  Allergen Reactions  . Chlorhexidine Itching  . Aspirin     REACTION: nose  bleeds with full strength  . Atorvastatin     REACTION: muscle pain  . Bactrim [Sulfamethoxazole-Tmp Ds]   . Ciprofloxacin     REACTION: nausea  . Clarithromycin   . Colestipol Hcl     REACTION: hallucinations  . Ezetimibe     REACTION: GI upset  . Penicillins     REACTION: rash  . Simvastatin     REACTION: muscle spasms  . Tape Rash      Review of Systems: See HPI for pertinent ROS. All other ROS negative.    Physical Exam: Blood pressure 144/86, pulse 60, temperature 97.4 F (36.3 C), temperature source Oral, resp. rate 18, weight 145 lb (65.772 kg)., Body mass index is 22.71 kg/(m^2). General: WNWD WF.  Appears in no acute distress. Neck: Supple. No thyromegaly. No lymphadenopathy. Lungs: Clear bilaterally to auscultation without wheezes, rales, or rhonchi. Breathing is unlabored. Heart: Regular rhythm. No murmurs, rubs, or gallops. Msk:  Strength and tone normal for age. Extremities/Skin: Warm and dry. Left forearm: 3 x 2 cm area of diffuse mild erythema. This is not in a bull's eye / target pattern-- but rather is diffuse erythema. The area is soft. Neuro: Alert and oriented X 3. Moves all extremities spontaneously. Gait is normal.  CNII-XII grossly in tact. Psych:  Responds to questions appropriately with a normal affect.     ASSESSMENT AND PLAN:  78 y.o. year old female with  1. Tick bite Discussed with her that the skin findings could simply be secondary to allergic/inflammatory response.   In this case I would treat this with Benadryl cream as well as oral Benadryl. however, To also cover for possible skin infection or infection with Lyme/[place] spotted fever would go ahead and  treat with doxycycline. Pt agreeable with the current plan. She is to followup with [place] if the site worsens or if she develops any of the other above symptoms or if this does not resolve with completion of doxycycline. - doxycycline (VIBRA-TABS) 100 MG tablet; Take 1 tablet (100 mg  total) by mouth 2 (two) times daily.  Dispense: 20 tablet; Refill: 0   Signed, [address] [place], [place], [facility] 07/09/2013 12:35 PM

## 2013-07-16 NOTE — Progress Notes (Signed)
  Patient Details  Name: Lauren House MRN: [id number] Date of Birth: 10/04/1935  Today's Date: 07/16/2013  Discharge pt as she has not returned after her MD visit.  Leeroy Cha 07/16/2013, 1:23 PM

## 2013-07-16 NOTE — Addendum Note (Signed)
Encounter addended by: Leeroy Cha, PT on: 07/16/2013  1:24 PM<BR>     Documentation filed: Notes Section, Episodes

## 2013-08-17 ENCOUNTER — Ambulatory Visit (INDEPENDENT_AMBULATORY_CARE_PROVIDER_SITE_OTHER): Payer: Medicare Other | Admitting: Internal Medicine

## 2013-08-17 ENCOUNTER — Encounter: Payer: Self-pay | Admitting: Internal Medicine

## 2013-08-17 VITALS — BP 130/82 | HR 58 | Ht 67.0 in | Wt 147.5 lb

## 2013-08-17 DIAGNOSIS — I201 Angina pectoris with documented spasm: Secondary | ICD-10-CM

## 2013-08-17 DIAGNOSIS — I422 Other hypertrophic cardiomyopathy: Secondary | ICD-10-CM

## 2013-08-17 DIAGNOSIS — I10 Essential (primary) hypertension: Secondary | ICD-10-CM

## 2013-08-17 DIAGNOSIS — E785 Hyperlipidemia, unspecified: Secondary | ICD-10-CM

## 2013-08-17 DIAGNOSIS — I428 Other cardiomyopathies: Secondary | ICD-10-CM

## 2013-08-17 DIAGNOSIS — I2510 Atherosclerotic heart disease of native coronary artery without angina pectoris: Secondary | ICD-10-CM

## 2013-08-17 MED ORDER — RANOLAZINE ER 500 MG PO TB12: 500.0000 mg | ORAL_TABLET | Freq: Two times a day (BID) | ORAL | Status: DC

## 2013-08-17 NOTE — Patient Instructions (Signed)
Dr Debara Pickett wants you to follow-up in 6 months. You will receive a reminder letter in the mail one months in advance. If you don't receive a letter, please call our office to schedule the follow-up appointment.

## 2013-08-27 ENCOUNTER — Encounter: Payer: Self-pay | Admitting: Internal Medicine

## 2013-08-27 NOTE — Progress Notes (Signed)
OFFICE NOTE  Chief Complaint:  Routine followup  Primary Care Physician: Odette Fraction, MD  HPI:  Lauren House is a pleasant 78 year old female I have been following for history of apical hypertrophic cardiomyopathy for which she has done well on Ranexa 500 mg daily. Recently, she was having right inguinal pain and had a hernia for which she had repair. That, unfortunately, she says has caused her more harm than good and she continues to have more pain in her right hip area which possibly is related to the hip. She saw Dr. Wynelle Link at [facility] for further evaluation from that and has received 2 hip injections which have been beneficial. He did mention that he felt she might need a hip replacement. Recently she had a recurrent episode of chest pain. This caused her to the hospital and improved with nitroglycerin. She was ruled out for MI and discharged. Unfortunately she returned several days later after she developed swelling, warmth and pain in her left forearm. It was felt that she might have possibly had an infected IV site and clearly had superficial thrombophlebitis. She was placed on antibiotics for this and is being treated with some improvement in her symptoms. After her emergency room visit, it was recommended that she increase her Ranexa 500 mg twice daily which is the recommended starting dose. She's had no further chest pain episodes. She had prior cardiac catheterization in 2011 which demonstrated a 50-60% LAD lesion. She underwent FFR for this which was negative with an FFR of 0.99. She had had a stress test at that time which indicated no evidence of reversible ischemia., but was having persistent chest pain.  It is been felt that her chest pain was due to small vessel disease in the setting of significant apical variant hypertrophic cardiomyopathy. This has been treated most effectively with ranolazine, and nitrates to some extent. Although her symptoms now have  escalated. This is concerning to me for possible worsening of her LAD disease.  [name] underwent a nuclear stress test on 02/03/2013. This was negative for ischemia with an EF of 52%. Since then she has reported some improvement in her symptoms. Her main complaints now have to do with left hip pain.  She is still contemplating surgery. She reported that her husband was recently diagnosed with esophageal cancer and it seems to be quite advanced. There did not appear to be any surgical options. This has caused her significant anxiety but she reports no chest pain.  PMHx:  Past Medical History  Diagnosis Date  . Essential hypertension, benign   . Osteoporosis   . Arthritis   . Hyperlipidemia     Diet controlled.cannot take meds  . Apical variant hypertrophic cardiomyopathy   . Anxiety   . History of kidney stones   . Vertigo   . History of skin cancer     Basal cell - abdomen  . Nocturia   . Borderline diabetes mellitus   . Coronary atherosclerosis of native coronary artery     Nonobstructive - 50-60% LAD (normal FFR) at catheterization December 2011  . Angina pectoris, variant     Likely microvascular    Past Surgical History  Procedure Laterality Date  . Abdominal hysterectomy    . Hemorrhoid surgery    . Eye surgery      cataracts bilateral  . Inguinal hernia repair  10/11/2011    Procedure: HERNIA REPAIR INGUINAL ADULT;  Surgeon: Stark Klein, MD;  Location: [facility];  Service:  General;  Laterality: Right;    FAMHx:  Family History  Problem Relation Age of Onset  . Heart disease Father   . Lung cancer Sister   . Lung cancer Brother     SOCHx:   reports that she has never smoked. She has never used smokeless tobacco. She reports that she does not drink alcohol or use illicit drugs.  ALLERGIES:  Allergies  Allergen Reactions  . Chlorhexidine Itching  . Aspirin     REACTION: nose bleeds with full strength  . Atorvastatin     REACTION: muscle pain  . Bactrim  [Sulfamethoxazole-Tmp Ds]   . Ciprofloxacin     REACTION: nausea  . Clarithromycin   . Colestipol Hcl     REACTION: hallucinations  . Ezetimibe     REACTION: GI upset  . Penicillins     REACTION: rash  . Simvastatin     REACTION: muscle spasms  . Tape Rash    ROS: A comprehensive review of systems was negative except for: Musculoskeletal: positive for Left hip pain  HOME MEDS: Current Outpatient Prescriptions  Medication Sig Dispense Refill  . aspirin 81 MG tablet Take 81 mg by mouth daily.      . Cyanocobalamin (VITAMIN B12 PO) Take 1 tablet by mouth daily.      . fluticasone (FLONASE) 50 MCG/ACT nasal spray USE TWO SPRAY IN EACH NOSTRIL EVERY DAY  50 g  1  . ibuprofen (ADVIL,MOTRIN) 200 MG tablet Take 400 mg by mouth every 8 (eight) hours as needed for pain.      . metoprolol tartrate (LOPRESSOR) 25 MG tablet TAKE ONE-HALF TABLET BY MOUTH TWICE DAILY  30 tablet  11  . Multiple Vitamins-Minerals (OCUVITE PO) Take 1 tablet by mouth daily.      . nitroGLYCERIN (NITROSTAT) 0.4 MG SL tablet Place 1 tablet (0.4 mg total) under the tongue every 5 (five) minutes as needed for chest pain.  25 tablet  3  . ranolazine (RANEXA) 500 MG 12 hr tablet Take 1 tablet (500 mg total) by mouth 2 (two) times daily.  56 tablet  0   No current facility-administered medications for this visit.    LABS/IMAGING: No results found for this or any previous visit (from the past 48 hour(s)). No results found.  VITALS: BP 130/82  Pulse 58  Ht 5\' 7"  (1.702 m)  Wt 147 lb 8 oz (66.906 kg)  BMI 23.10 kg/m2  EXAM: GEN: Awake, in no apparent distress HEENT: PERRLA, EOMI Lungs: Clear Cardiovascular: Regular rate and rhythm normal S1-S2 Abdomen: Soft, nontender positive bowel sounds Extremities: No edema Neurologic: No focal deficits Psychiatric: Mildly anxious  EKG: Sinus bradycardia 58, anterolateral T-wave inversions consistent with hypertrophic cardiomyopathy  ASSESSMENT: 1. Apical variant  hypertrophic retinopathy - anterolateral TWI's - negative nuclear stress test 01-2013 2. Chest pain - known 50-60% LAD stenosis (FFR 0.99 in 2011) 3. Hypertension 4. Venous insufficiency 5. Right inguinal pain with a hernia, status post surgical repair 6. Right hip pain with degenerative hip disease -low risk for surgery  PLAN: 1.   Mrs. Hefty has ongoing hip pain but does not appear to be interested in surgery at this time. Her husband was recently diagnosed with what sounds like an advanced cancer and that is her top priority. She has not had any chest pain currently. She is on appropriate medications. I recommend continuing her current medications and we'll see her back in 6 months.  [name], MD, [facility] Attending Cardiologist [facility]  Wretha Laris C 08/27/2013, 5:43 PM

## 2013-09-09 ENCOUNTER — Telehealth: Payer: Self-pay | Admitting: *Deleted

## 2013-09-09 NOTE — Telephone Encounter (Signed)
Patient notified that ranexa connect form has been completed and will be mailed.

## 2013-09-30 ENCOUNTER — Telehealth: Payer: Self-pay | Admitting: Internal Medicine

## 2013-09-30 NOTE — Telephone Encounter (Signed)
Patient states that she called the drug company regarding the paperwork for assistance with her Ranexa and they states they has sent additional forms that needed Dr. Lysbeth Penner signature.

## 2013-10-01 NOTE — Telephone Encounter (Signed)
Faxed Ranexa Connect form signed by Dr.Hilty to [phone]

## 2013-10-01 NOTE — Telephone Encounter (Signed)
I have the forms he originally signed. I can re-fax

## 2013-10-01 NOTE — Telephone Encounter (Signed)
Jenna  Have you seen any paperwork on Ranexa for Ms. Mahler?

## 2013-10-02 MED ORDER — RANOLAZINE ER 500 MG PO TB12: 500.0000 mg | ORAL_TABLET | Freq: Two times a day (BID) | ORAL | Status: DC

## 2013-10-02 NOTE — Telephone Encounter (Signed)
Company now needs a faxed prescription for Ranexa.  Phone # [phone]

## 2013-10-02 NOTE — Telephone Encounter (Signed)
Refill for ranexa printed. Patient notified that Dr. Debara Pickett can sign on Monday August 10th and then it can be faxed in.   Rx need to be faxed to [phone]

## 2013-10-05 ENCOUNTER — Telehealth: Payer: Self-pay | Admitting: *Deleted

## 2013-10-05 NOTE — Telephone Encounter (Signed)
Faxed Rx for ranexa 500mg  BID #[id number] with 3 refills

## 2013-10-06 ENCOUNTER — Telehealth: Payer: Self-pay | Admitting: *Deleted

## 2013-10-06 NOTE — Telephone Encounter (Signed)
Patient notified that she is eligible for temporary enrollment with Ranexa Connect from 10/05/13 - 02/25/14 Notified patient that she will receive a letter in the mail with this information as well

## 2013-12-08 ENCOUNTER — Ambulatory Visit (INDEPENDENT_AMBULATORY_CARE_PROVIDER_SITE_OTHER): Payer: Medicare Other | Admitting: *Deleted

## 2013-12-08 DIAGNOSIS — Z23 Encounter for immunization: Secondary | ICD-10-CM

## 2013-12-28 ENCOUNTER — Encounter: Payer: Self-pay | Admitting: Internal Medicine

## 2014-02-10 ENCOUNTER — Ambulatory Visit (INDEPENDENT_AMBULATORY_CARE_PROVIDER_SITE_OTHER): Payer: Medicare Other | Admitting: Internal Medicine

## 2014-02-10 ENCOUNTER — Encounter: Payer: Self-pay | Admitting: Internal Medicine

## 2014-02-10 VITALS — BP 152/78 | HR 61 | Ht 67.0 in | Wt 148.4 lb

## 2014-02-10 DIAGNOSIS — I422 Other hypertrophic cardiomyopathy: Secondary | ICD-10-CM

## 2014-02-10 DIAGNOSIS — I2510 Atherosclerotic heart disease of native coronary artery without angina pectoris: Secondary | ICD-10-CM

## 2014-02-10 DIAGNOSIS — R0609 Other forms of dyspnea: Secondary | ICD-10-CM

## 2014-02-10 DIAGNOSIS — Z01810 Encounter for preprocedural cardiovascular examination: Secondary | ICD-10-CM | POA: Insufficient documentation

## 2014-02-10 DIAGNOSIS — I10 Essential (primary) hypertension: Secondary | ICD-10-CM

## 2014-02-10 DIAGNOSIS — E785 Hyperlipidemia, unspecified: Secondary | ICD-10-CM

## 2014-02-10 DIAGNOSIS — R0600 Dyspnea, unspecified: Secondary | ICD-10-CM

## 2014-02-10 DIAGNOSIS — R079 Chest pain, unspecified: Secondary | ICD-10-CM

## 2014-02-10 NOTE — Patient Instructions (Signed)
Your physician has requested that you have a lexiscan myoview. For further information please visit [email or web address]. Please follow instruction sheet, as given.  Your physician wants you to follow-up in: 6 months with Dr. Debara Pickett. You will receive a reminder letter in the mail two months in advance. If you don't receive a letter, please call our office to schedule the follow-up appointment.

## 2014-02-10 NOTE — Progress Notes (Signed)
OFFICE NOTE  Chief Complaint:  Pre-op, chest pressure, DOE  Primary Care Physician: Odette Fraction, MD  HPI:  Lauren House is a pleasant 78 year old female I have been following for history of apical hypertrophic cardiomyopathy for which she has done well on Ranexa 500 mg daily. Recently, she was having right inguinal pain and had a hernia for which she had repair. That, unfortunately, she says has caused her more harm than good and she continues to have more pain in her right hip area which possibly is related to the hip. She saw Dr. Wynelle Link at [facility] for further evaluation from that and has received 2 hip injections which have been beneficial. He did mention that he felt she might need a hip replacement. Recently she had a recurrent episode of chest pain. This caused her to the hospital and improved with nitroglycerin. She was ruled out for MI and discharged. Unfortunately she returned several days later after she developed swelling, warmth and pain in her left forearm. It was felt that she might have possibly had an infected IV site and clearly had superficial thrombophlebitis. She was placed on antibiotics for this and is being treated with some improvement in her symptoms. After her emergency room visit, it was recommended that she increase her Ranexa 500 mg twice daily which is the recommended starting dose. She's had no further chest pain episodes. She had prior cardiac catheterization in 2011 which demonstrated a 50-60% LAD lesion. She underwent FFR for this which was negative with an FFR of 0.99. She had had a stress test at that time which indicated no evidence of reversible ischemia., but was having persistent chest pain.  It is been felt that her chest pain was due to small vessel disease in the setting of significant apical variant hypertrophic cardiomyopathy. This has been treated most effectively with ranolazine, and nitrates to some extent. Although her symptoms  now have escalated. This is concerning to me for possible worsening of her LAD disease.  Lauren House underwent a nuclear stress test on 02/03/2013. This was negative for ischemia with an EF of 52%. Since then she has reported some improvement in her symptoms. Her main complaints now have to do with left hip pain.  She is still contemplating surgery. She reported that her husband was recently diagnosed with esophageal cancer and it seems to be quite advanced. There did not appear to be any surgical options. This has caused her significant anxiety but she reports no chest pain.  Lauren House returns today for follow-up. Unfortunately she mentioned that her husband died about 3 months ago. He was diagnosed with esophageal cancer which was aggressive and inoperable. She is still grieving which is to be expected. She continues to have problems with her hip and at this point apparently will need hip surgery, but probably not hip replacement. That is scheduled about one month from now. She does have a history of moderate LAD stenosis in 2011 with a negative stress test one year ago. Recently, she has had worsening shortness of breath and chest pressure with exertion. She initially felt this was due to stress however her symptoms have been somewhat progressive and concern me for ischemia. Her EKG shows stable T-wave inversions consistent with her hypertrophic cardiomyopathy.  PMHx:  Past Medical History  Diagnosis Date  . Essential hypertension, benign   . Osteoporosis   . Arthritis   . Hyperlipidemia     Diet controlled.cannot take meds  . Apical variant hypertrophic cardiomyopathy   .  Anxiety   . History of kidney stones   . Vertigo   . History of skin cancer     Basal cell - abdomen  . Nocturia   . Borderline diabetes mellitus   . Coronary atherosclerosis of native coronary artery     Nonobstructive - 50-60% LAD (normal FFR) at catheterization December 2011  . Angina pectoris, variant     Likely  microvascular    Past Surgical History  Procedure Laterality Date  . Abdominal hysterectomy    . Hemorrhoid surgery    . Eye surgery      cataracts bilateral  . Inguinal hernia repair  10/11/2011    Procedure: HERNIA REPAIR INGUINAL ADULT;  Surgeon: Stark Klein, MD;  Location: [facility];  Service: General;  Laterality: Right;    FAMHx:  Family History  Problem Relation Age of Onset  . Heart disease Father   . Lung cancer Sister   . Lung cancer Brother     SOCHx:   reports that she has never smoked. She has never used smokeless tobacco. She reports that she does not drink alcohol or use illicit drugs.  ALLERGIES:  Allergies  Allergen Reactions  . Chlorhexidine Itching  . Aspirin     REACTION: nose bleeds with full strength  . Atorvastatin     REACTION: muscle pain  . Bactrim [Sulfamethoxazole-Trimethoprim]   . Ciprofloxacin     REACTION: nausea  . Clarithromycin   . Colestipol Hcl     REACTION: hallucinations  . Ezetimibe     REACTION: GI upset  . Penicillins     REACTION: rash  . Simvastatin     REACTION: muscle spasms  . Tape Rash    ROS: A comprehensive review of systems was negative except for: Respiratory: positive for dyspnea on exertion Cardiovascular: positive for exertional chest pressure/discomfort Musculoskeletal: positive for Left hip pain  HOME MEDS: Current Outpatient Prescriptions  Medication Sig Dispense Refill  . aspirin 81 MG tablet Take 81 mg by mouth daily.    . Cyanocobalamin (VITAMIN B12 PO) Take 1 tablet by mouth daily.    . fluticasone (FLONASE) 50 MCG/ACT nasal spray USE TWO SPRAY IN EACH NOSTRIL EVERY DAY 50 g 1  . ibuprofen (ADVIL,MOTRIN) 200 MG tablet Take 400 mg by mouth every 8 (eight) hours as needed for pain.    . metoprolol tartrate (LOPRESSOR) 25 MG tablet TAKE ONE-HALF TABLET BY MOUTH TWICE DAILY 30 tablet 11  . Multiple Vitamins-Minerals (OCUVITE PO) Take 1 tablet by mouth daily.    . nitroGLYCERIN (NITROSTAT) 0.4 MG SL  tablet Place 1 tablet (0.4 mg total) under the tongue every 5 (five) minutes as needed for chest pain. 25 tablet 3  . ranolazine (RANEXA) 500 MG 12 hr tablet Take 1 tablet (500 mg total) by mouth 2 (two) times daily. 180 tablet 3   No current facility-administered medications for this visit.    LABS/IMAGING: No results found for this or any previous visit (from the past 48 hour(s)). No results found.  VITALS: BP 152/78 mmHg  Pulse 61  Ht 5\' 7"  (1.702 m)  Wt 148 lb 6.4 oz (67.314 kg)  BMI 23.24 kg/m2  EXAM: GEN: Awake, in no apparent distress HEENT: PERRLA, EOMI Lungs: Clear Cardiovascular: Regular rate and rhythm normal S1-S2 Abdomen: Soft, nontender positive bowel sounds Extremities: No edema Neurologic: No focal deficits Psychiatric: Appropriately grieving  EKG: Normal sinus rhythm at 61, anterolateral T-wave inversions consistent with hypertrophic cardiomyopathy  ASSESSMENT: 1. Apical variant hypertrophic retinopathy -  anterolateral TWI's - negative nuclear stress test 01-2013 2. Recurrent chest pain - known 50-60% LAD stenosis (FFR 0.99 in 2011) 3. Hypertension 4. Venous insufficiency 5. Right inguinal pain with a hernia, status post surgical repair 6. Right hip pain with degenerative hip disease -low risk for surgery  PLAN: 1.   Lauren House has recently been having more shortness of breath and chest discomfort. Her last stress test was negative one year ago however she is now contemplating hip surgery and is anxious scheduled 1 month from now. Given her known LAD stenosis, and new chest pain and shortness of breath, I would recommend repeat stress testing prior to surgery. If there has been some progression of her LAD stenosis, we may need to intervene prior to her undergoing surgery.  I will be in contact with the results of her stress test.  [name], MD, [facility] Attending Cardiologist [facility] 02/10/2014,  10:42 AM

## 2014-02-16 ENCOUNTER — Telehealth (HOSPITAL_COMMUNITY): Payer: Self-pay

## 2014-02-16 NOTE — Telephone Encounter (Signed)
Encounter complete. 

## 2014-02-23 ENCOUNTER — Ambulatory Visit (HOSPITAL_COMMUNITY)
Admission: RE | Admit: 2014-02-23 | Discharge: 2014-02-23 | Disposition: A | Discharge status: Home / Self Care | Payer: Medicare Other | Source: Ambulatory Visit | Attending: Cardiology | Admitting: Cardiology

## 2014-02-23 DIAGNOSIS — R079 Chest pain, unspecified: Secondary | ICD-10-CM

## 2014-02-23 DIAGNOSIS — I10 Essential (primary) hypertension: Secondary | ICD-10-CM | POA: Diagnosis present

## 2014-02-23 DIAGNOSIS — Z8249 Family history of ischemic heart disease and other diseases of the circulatory system: Secondary | ICD-10-CM | POA: Insufficient documentation

## 2014-02-23 DIAGNOSIS — E785 Hyperlipidemia, unspecified: Secondary | ICD-10-CM | POA: Diagnosis not present

## 2014-02-23 DIAGNOSIS — I2510 Atherosclerotic heart disease of native coronary artery without angina pectoris: Secondary | ICD-10-CM

## 2014-02-23 DIAGNOSIS — R0600 Dyspnea, unspecified: Secondary | ICD-10-CM

## 2014-02-23 DIAGNOSIS — R0609 Other forms of dyspnea: Secondary | ICD-10-CM

## 2014-02-23 DIAGNOSIS — Z01810 Encounter for preprocedural cardiovascular examination: Secondary | ICD-10-CM

## 2014-02-23 MED ORDER — TECHNETIUM TC 99M SESTAMIBI GENERIC - CARDIOLITE
31.9000 | Freq: Once | INTRAVENOUS | Status: AC | PRN
  Administered 2014-02-23: 31.9 via INTRAVENOUS

## 2014-02-23 MED ORDER — AMINOPHYLLINE 25 MG/ML IV SOLN
75.0000 mg | Freq: Once | INTRAVENOUS | Status: AC
  Administered 2014-02-23: 75 mg via INTRAVENOUS

## 2014-02-23 MED ORDER — TECHNETIUM TC 99M SESTAMIBI GENERIC - CARDIOLITE
10.6000 | Freq: Once | INTRAVENOUS | Status: AC | PRN
  Administered 2014-02-23: 11 via INTRAVENOUS

## 2014-02-23 MED ORDER — REGADENOSON 0.4 MG/5ML IV SOLN
0.4000 mg | Freq: Once | INTRAVENOUS | Status: AC
  Administered 2014-02-23: 0.4 mg via INTRAVENOUS

## 2014-02-23 NOTE — Procedures (Addendum)
[facility] [address] [place] [address] [phone]  Cardiology Nuclear Med Study  Lauren House is a 78 y.o. female     MRN : [id number]     DOB: 05-18-1935  Procedure Date: 02/23/2014  Nuclear Med Background Indication for Stress Test:  Surgical Clearance, [facility] and Follow up CAD History:  CAD;APICAL HYPERTROPHIC CARDIOMYOPATHY;Cath in 2011-50-60%LAD/no intervention; Cardiac Risk Factors: Family History - CAD, Hypertension and Lipids     Nuclear Pre-Procedure Caffeine/Decaff Intake:  12:00am NPO After: 10am   IV Site: R Forearm  IV 0.9% NS with Angio Cath:  22g  Chest Size (in):  n/a IV Started by: Rolene Course, RN  Height: 5\' 7"  (1.702 m)  Cup Size: A  BMI:  Body mass index is 23.17 kg/(m^2). Weight:  148 lb (67.132 kg)   Tech Comments:  n/a    Nuclear Med Study 1 or 2 day study: 1 day  Stress Test Type:  [place] Provider:  Lyman Bishop, MD   Resting Radionuclide: Technetium 50m Sestamibi  Resting Radionuclide Dose: 10.6 mCi   Stress Radionuclide:  Technetium 64m Sestamibi  Stress Radionuclide Dose: 31.9 mCi           Stress Protocol Rest HR: 62 Stress HR: 85  Rest BP: 166/95 Stress BP: 166/97  Exercise Time (min): n/a METS: n/a          Dose of Adenosine (mg):  n/a Dose of Lexiscan: 0.4 mg  Dose of Atropine (mg): n/a Dose of Dobutamine: n/a mcg/kg/min (at max HR)  Stress Test Technologist: Mellody Memos, CCT Nuclear Technologist: Imagene Riches, CNMT   Rest Procedure:  Myocardial perfusion imaging was performed at rest 45 minutes following the intravenous administration of Technetium 61m Sestamibi. Stress Procedure:  The patient received IV Lexiscan 0.4 mg over 15-seconds.  Technetium 61m Sestamibi injected IV at 30-seconds.  Patient experienced shortness of breath, flushing, nausea and was administered 75 mg of Aminophylline IV .  There were no significant changes with  Lexiscan.  Quantitative spect images were obtained after a 45 minute delay.  Transient Ischemic Dilatation (Normal <1.22):  1.19  QGS EDV:  89 ml QGS ESV:  44 ml LV Ejection Fraction: 51%        Rest ECG: NSR-LVH  Stress ECG: No significant change from baseline ECG  QPS Raw Data Images:  Normal; no motion artifact; normal heart/lung ratio. Stress Images:  Normal homogeneous uptake in all areas of the myocardium. Rest Images:  Normal homogeneous uptake in all areas of the myocardium. Subtraction (SDS):  No evidence of ischemia.  Impression Exercise Capacity:  Lexiscan with no exercise. BP Response:  Normal blood pressure response. Clinical Symptoms:  No significant symptoms noted. ECG Impression:  No significant ECG changes with Lexiscan. Comparison with Prior Nuclear Study: No significant change from previous study  Overall Impression:  Normal stress nuclear study.  LV Wall Motion:  NL LV Function; NL Wall Motion   Lorretta Harp, MD  02/23/2014 3:50 PM

## 2014-03-01 ENCOUNTER — Ambulatory Visit: Payer: Self-pay | Admitting: Orthopedic Surgery

## 2014-03-01 NOTE — Progress Notes (Signed)
Preoperative surgical orders have been place into the Epic hospital system for Lauren House on 03/01/2014, 12:12 PM  by Mickel Crow for surgery on 03-12-2014.  Preop Hip orders including Experel Injecion, IV Tylenol, and IV Decadron as long as there are no contraindications to the above medications. Arlee Muslim, PA-C

## 2014-03-08 NOTE — Patient Instructions (Addendum)
Lauren House  03/08/2014   Your procedure is scheduled on: 03/12/14   Report to [facility] Main  Entrance and follow signs to               [place] at 1:30 PM.   Call this number if you have problems the morning of surgery [phone]   Remember:  Do not eat food :After Midnight.               MAY HAVE CLEAR LIQUIDS UNTIL 9:45 AM     CLEAR LIQUID DIET   Foods Allowed                                                                     Foods Excluded  Coffee and tea, regular and decaf                             liquids that you cannot  Plain Jell-O in any flavor                                             see through such as: Fruit ices (not with fruit pulp)                                     milk, soups, orange juice  Iced Popsicles                                    All solid food Carbonated beverages, regular and diet                                    Cranberry, grape and apple juices Sports drinks like Gatorade Lightly seasoned clear broth or consume(fat free) Sugar, honey syrup  _____________________________________________________________________            Take these medicines the morning of surgery with A SIP OF WATER: RENEXA / METOPROLOL                               You may not have any metal on your body including hair pins and              piercings  Do not wear jewelry, make-up, lotions, powders or perfumes.             Do not wear nail polish.  Do not shave  48 hours prior to surgery.              Men may shave face and neck.   Do not bring valuables to the hospital. [place].  Contacts, dentures or bridgework  may not be worn into surgery.  Leave suitcase in the car. After surgery it may be brought to your room.     Patients discharged the day of surgery will not be allowed to drive home.  Name and phone number of your driver:  Special Instructions: N/A               Please read over the following fact sheets you were given: _____________________________________________________________________                                                     [place]  Before surgery, you can play an important role.  Because skin is not sterile, your skin needs to be as free of germs as possible.  You can reduce the number of germs on your skin by washing with CHG (chlorahexidine gluconate) soap before surgery.  CHG is an antiseptic cleaner which kills germs and bonds with the skin to continue killing germs even after washing. Please DO NOT use if you have an allergy to CHG or antibacterial soaps.  If your skin becomes reddened/irritated stop using the CHG and inform your nurse when you arrive at Short Stay. Do not shave (including legs and underarms) for at least 48 hours prior to the first CHG shower.  You may shave your face. Please follow these instructions carefully:   1.  Shower with CHG Soap the night before surgery and the  morning of Surgery.   2.  If you choose to wash your hair, wash your hair first as usual with your  normal  Shampoo.   3.  After you shampoo, rinse your hair and body thoroughly to remove the  shampoo.                                         4.  Use CHG as you would any other liquid soap.  You can apply chg directly  to the skin and wash . Gently wash with scrungie or clean wascloth    5.  Apply the CHG Soap to your body ONLY FROM THE NECK DOWN.   Do not use on open                           Wound or open sores. Avoid contact with eyes, ears mouth and genitals (private parts).                        Genitals (private parts) with your normal soap.              6.  Wash thoroughly, paying special attention to the area where your surgery  will be performed.   7.  Thoroughly rinse your body with warm water from the neck down.   8.  DO NOT shower/wash with your normal soap after using and rinsing off  the CHG  Soap .                9.  Pat yourself dry with a clean towel.             10.  Wear clean pajamas.  11.  Place clean sheets on your bed the night of your first shower and do not  sleep with pets.  Day of Surgery : Do not apply any lotions/deodorants the morning of surgery.  Please wear clean clothes to the hospital/surgery center.  FAILURE TO FOLLOW THESE INSTRUCTIONS MAY RESULT IN THE CANCELLATION OF YOUR SURGERY    PATIENT SIGNATURE_________________________________  ______________________________________________________________________     Lauren House  An incentive spirometer is a tool that can help keep your lungs clear and active. This tool measures how well you are filling your lungs with each breath. Taking long deep breaths may help reverse or decrease the chance of developing breathing (pulmonary) problems (especially infection) following:  A long period of time when you are unable to move or be active. BEFORE THE PROCEDURE   If the spirometer includes an indicator to show your best effort, your nurse or respiratory therapist will set it to a desired goal.  If possible, sit up straight or lean slightly forward. Try not to slouch.  Hold the incentive spirometer in an upright position. INSTRUCTIONS FOR USE  1. Sit on the edge of your bed if possible, or sit up as far as you can in bed or on a chair. 2. Hold the incentive spirometer in an upright position. 3. Breathe out normally. 4. Place the mouthpiece in your mouth and seal your lips tightly around it. 5. Breathe in slowly and as deeply as possible, raising the piston or the ball toward the top of the column. 6. Hold your breath for 3-5 seconds or for as long as possible. Allow the piston or ball to fall to the bottom of the column. 7. Remove the mouthpiece from your mouth and breathe out normally. 8. Rest for a few seconds and repeat Steps 1 through 7 at least 10 times every 1-2 hours when you  are awake. Take your time and take a few normal breaths between deep breaths. 9. The spirometer may include an indicator to show your best effort. Use the indicator as a goal to work toward during each repetition. 10. After each set of 10 deep breaths, practice coughing to be sure your lungs are clear. If you have an incision (the cut made at the time of surgery), support your incision when coughing by placing a pillow or rolled up towels firmly against it. Once you are able to get out of bed, walk around indoors and cough well. You may stop using the incentive spirometer when instructed by your caregiver.  RISKS AND COMPLICATIONS  Take your time so you do not get dizzy or light-headed.  If you are in pain, you may need to take or ask for pain medication before doing incentive spirometry. It is harder to take a deep breath if you are having pain. AFTER USE  Rest and breathe slowly and easily.  It can be helpful to keep track of a log of your progress. Your caregiver can provide you with a simple table to help with this. If you are using the spirometer at home, follow these instructions: [place] IF:   You are having difficultly using the spirometer.  You have trouble using the spirometer as often as instructed.  Your pain medication is not giving enough relief while using the spirometer.  You develop fever of 100.5 F (38.1 C) or higher. SEEK IMMEDIATE MEDICAL CARE IF:   You cough up bloody sputum that had not been present before.  You develop fever of 102 F (  38.9 C) or greater.  You develop worsening pain at or near the incision site. MAKE SURE YOU:   Understand these instructions.  Will watch your condition.  Will get help right away if you are not doing well or get worse. Document Released: 06/25/2006 Document Revised: 05/07/2011 Document Reviewed: 08/26/2006 [facility] Patient Information 2014 [place].   ________________________________________________________________________

## 2014-03-09 ENCOUNTER — Encounter (HOSPITAL_COMMUNITY): Payer: Self-pay

## 2014-03-09 ENCOUNTER — Encounter (HOSPITAL_COMMUNITY)
Admission: RE | Admit: 2014-03-09 | Discharge: 2014-03-09 | Disposition: A | Discharge status: Home / Self Care | Payer: Medicare Other | Source: Ambulatory Visit | Attending: Orthopedic Surgery | Admitting: Orthopedic Surgery

## 2014-03-09 ENCOUNTER — Ambulatory Visit (HOSPITAL_COMMUNITY)
Admission: RE | Admit: 2014-03-09 | Discharge: 2014-03-09 | Disposition: A | Discharge status: Home / Self Care | Payer: Medicare Other | Source: Ambulatory Visit | Attending: Anesthesiology | Admitting: Anesthesiology

## 2014-03-09 DIAGNOSIS — Z01818 Encounter for other preprocedural examination: Secondary | ICD-10-CM | POA: Diagnosis present

## 2014-03-09 DIAGNOSIS — I10 Essential (primary) hypertension: Secondary | ICD-10-CM

## 2014-03-09 HISTORY — DX: Spontaneous ecchymoses: R23.3

## 2014-03-09 HISTORY — DX: Other bursitis of hip, left hip: M70.72

## 2014-03-09 HISTORY — DX: Other skin changes: R23.8

## 2014-03-09 HISTORY — DX: Reserved for inherently not codable concepts without codable children: IMO0001

## 2014-03-09 LAB — BASIC METABOLIC PANEL
ANION GAP: 8 (ref 5–15)
BUN: 20 mg/dL (ref 6–23)
CO2: 29 mmol/L (ref 19–32)
CREATININE: 1.03 mg/dL (ref 0.50–1.10)
Calcium: 9.5 mg/dL (ref 8.4–10.5)
Chloride: 100 mEq/L (ref 96–112)
GFR, EST AFRICAN AMERICAN: 60 mL/min — AB (ref >90)
GFR, EST NON AFRICAN AMERICAN: 51 mL/min — AB (ref >90)
Glucose, Bld: 111 mg/dL — ABNORMAL HIGH (ref 70–99)
Potassium: 4.8 mmol/L (ref 3.5–5.1)
SODIUM: 137 mmol/L (ref 135–145)

## 2014-03-09 LAB — CBC
HEMATOCRIT: 46 % (ref 36.0–46.0)
Hemoglobin: 14.9 g/dL (ref 12.0–15.0)
MCH: 29.2 pg (ref 26.0–34.0)
MCHC: 32.4 g/dL (ref 30.0–36.0)
MCV: 90 fL (ref 78.0–100.0)
Platelets: 254 10*3/uL (ref 150–400)
RBC: 5.11 MIL/uL (ref 3.87–5.11)
RDW: 13 % (ref 11.5–15.5)
WBC: 8.4 10*3/uL (ref 4.0–10.5)

## 2014-03-12 ENCOUNTER — Observation Stay (HOSPITAL_COMMUNITY)
Admission: RE | Admit: 2014-03-12 | Discharge: 2014-03-13 | Disposition: A | Discharge status: Home / Self Care | Payer: Medicare Other | Source: Ambulatory Visit | Attending: Orthopedic Surgery | Admitting: Orthopedic Surgery

## 2014-03-12 ENCOUNTER — Ambulatory Visit (HOSPITAL_COMMUNITY): Payer: Medicare Other | Admitting: Anesthesiology

## 2014-03-12 ENCOUNTER — Encounter (HOSPITAL_COMMUNITY)
Admission: RE | Disposition: A | Discharge status: Home / Self Care | Payer: Self-pay | Source: Ambulatory Visit | Attending: Orthopedic Surgery

## 2014-03-12 ENCOUNTER — Encounter (HOSPITAL_COMMUNITY): Payer: Self-pay | Admitting: *Deleted

## 2014-03-12 DIAGNOSIS — M810 Age-related osteoporosis without current pathological fracture: Secondary | ICD-10-CM | POA: Insufficient documentation

## 2014-03-12 DIAGNOSIS — F419 Anxiety disorder, unspecified: Secondary | ICD-10-CM | POA: Diagnosis not present

## 2014-03-12 DIAGNOSIS — Z7982 Long term (current) use of aspirin: Secondary | ICD-10-CM | POA: Insufficient documentation

## 2014-03-12 DIAGNOSIS — I10 Essential (primary) hypertension: Secondary | ICD-10-CM | POA: Diagnosis not present

## 2014-03-12 DIAGNOSIS — I422 Other hypertrophic cardiomyopathy: Secondary | ICD-10-CM | POA: Diagnosis not present

## 2014-03-12 DIAGNOSIS — E785 Hyperlipidemia, unspecified: Secondary | ICD-10-CM | POA: Diagnosis not present

## 2014-03-12 DIAGNOSIS — M7070 Other bursitis of hip, unspecified hip: Secondary | ICD-10-CM | POA: Diagnosis present

## 2014-03-12 DIAGNOSIS — M7072 Other bursitis of hip, left hip: Secondary | ICD-10-CM | POA: Diagnosis not present

## 2014-03-12 DIAGNOSIS — I2510 Atherosclerotic heart disease of native coronary artery without angina pectoris: Secondary | ICD-10-CM | POA: Diagnosis not present

## 2014-03-12 DIAGNOSIS — M6688 Spontaneous rupture of other tendons, other: Secondary | ICD-10-CM | POA: Diagnosis not present

## 2014-03-12 DIAGNOSIS — Z85828 Personal history of other malignant neoplasm of skin: Secondary | ICD-10-CM | POA: Diagnosis not present

## 2014-03-12 DIAGNOSIS — M7062 Trochanteric bursitis, left hip: Secondary | ICD-10-CM | POA: Diagnosis present

## 2014-03-12 HISTORY — PX: OPEN SURGICAL REPAIR OF GLUTEAL TENDON: SHX5995

## 2014-03-12 SURGERY — REPAIR, TENDON, GLUTEUS MEDIUS, OPEN
Anesthesia: General | Site: Hip | Laterality: Left

## 2014-03-12 MED ORDER — SODIUM CHLORIDE 0.9 % IV SOLN
INTRAVENOUS | Status: DC
  Administered 2014-03-12: 21:00:00 via INTRAVENOUS

## 2014-03-12 MED ORDER — PROPOFOL 10 MG/ML IV BOLUS
INTRAVENOUS | Status: AC
Start: 2014-03-12 — End: 2014-03-12
  Filled 2014-03-12: qty 20

## 2014-03-12 MED ORDER — METHOCARBAMOL 500 MG PO TABS: 500.0000 mg | ORAL_TABLET | Freq: Four times a day (QID) | ORAL | Status: DC | PRN

## 2014-03-12 MED ORDER — DIPHENHYDRAMINE HCL 50 MG/ML IJ SOLN
INTRAMUSCULAR | Status: AC
  Filled 2014-03-12: qty 1

## 2014-03-12 MED ORDER — FENTANYL CITRATE 0.05 MG/ML IJ SOLN
INTRAMUSCULAR | Status: DC | PRN
  Administered 2014-03-12 (×2): 50 ug via INTRAVENOUS

## 2014-03-12 MED ORDER — BUPIVACAINE LIPOSOME 1.3 % IJ SUSP
INTRAMUSCULAR | Status: DC | PRN
  Administered 2014-03-12: 20 mL

## 2014-03-12 MED ORDER — NEOSTIGMINE METHYLSULFATE 10 MG/10ML IV SOLN
INTRAVENOUS | Status: AC
  Filled 2014-03-12: qty 1

## 2014-03-12 MED ORDER — RANOLAZINE ER 500 MG PO TB12
500.0000 mg | ORAL_TABLET | Freq: Two times a day (BID) | ORAL | Status: DC
  Administered 2014-03-12 – 2014-03-13 (×2): 500 mg via ORAL
  Filled 2014-03-12 (×3): qty 1

## 2014-03-12 MED ORDER — GLYCOPYRROLATE 0.2 MG/ML IJ SOLN
INTRAMUSCULAR | Status: AC
  Filled 2014-03-12: qty 3

## 2014-03-12 MED ORDER — POVIDONE-IODINE 7.5 % EX SOLN: Freq: Once | CUTANEOUS | Status: DC

## 2014-03-12 MED ORDER — MIDAZOLAM HCL 2 MG/2ML IJ SOLN
INTRAMUSCULAR | Status: AC
  Filled 2014-03-12: qty 2

## 2014-03-12 MED ORDER — DEXAMETHASONE SODIUM PHOSPHATE 10 MG/ML IJ SOLN
INTRAMUSCULAR | Status: DC | PRN
  Administered 2014-03-12: 10 mg via INTRAVENOUS

## 2014-03-12 MED ORDER — SODIUM CHLORIDE 0.9 % IJ SOLN
INTRAMUSCULAR | Status: AC
  Filled 2014-03-12: qty 50

## 2014-03-12 MED ORDER — FENTANYL CITRATE 0.05 MG/ML IJ SOLN
INTRAMUSCULAR | Status: AC
  Filled 2014-03-12: qty 2

## 2014-03-12 MED ORDER — METOCLOPRAMIDE HCL 10 MG PO TABS
5.0000 mg | ORAL_TABLET | Freq: Three times a day (TID) | ORAL | Status: DC | PRN
Start: 2014-03-12 — End: 2014-03-13

## 2014-03-12 MED ORDER — METOCLOPRAMIDE HCL 5 MG/ML IJ SOLN
5.0000 mg | Freq: Three times a day (TID) | INTRAMUSCULAR | Status: DC | PRN
Start: 2014-03-12 — End: 2014-03-13

## 2014-03-12 MED ORDER — ONDANSETRON HCL 4 MG/2ML IJ SOLN: 4.0000 mg | Freq: Four times a day (QID) | INTRAMUSCULAR | Status: DC | PRN

## 2014-03-12 MED ORDER — ATROPINE SULFATE 0.4 MG/ML IJ SOLN
INTRAMUSCULAR | Status: AC
  Filled 2014-03-12: qty 1

## 2014-03-12 MED ORDER — PROPOFOL 10 MG/ML IV BOLUS
INTRAVENOUS | Status: AC
  Filled 2014-03-12: qty 20

## 2014-03-12 MED ORDER — VANCOMYCIN HCL IN DEXTROSE 1-5 GM/200ML-% IV SOLN
1000.0000 mg | INTRAVENOUS | Status: AC
  Administered 2014-03-12: 1000 mg via INTRAVENOUS

## 2014-03-12 MED ORDER — BUPIVACAINE LIPOSOME 1.3 % IJ SUSP
20.0000 mL | Freq: Once | INTRAMUSCULAR | Status: DC
  Filled 2014-03-12: qty 20

## 2014-03-12 MED ORDER — VANCOMYCIN HCL 1000 MG IV SOLR
1000.0000 mg | INTRAVENOUS | Status: DC | PRN
  Administered 2014-03-12: 1000 mg via INTRAVENOUS

## 2014-03-12 MED ORDER — METHOCARBAMOL 500 MG PO TABS: 500.0000 mg | ORAL_TABLET | Freq: Four times a day (QID) | ORAL | Status: DC

## 2014-03-12 MED ORDER — PROPOFOL 10 MG/ML IV BOLUS
INTRAVENOUS | Status: DC | PRN
  Administered 2014-03-12: 100 mg via INTRAVENOUS

## 2014-03-12 MED ORDER — DEXTROSE 5 % IV SOLN
500.0000 mg | Freq: Four times a day (QID) | INTRAVENOUS | Status: DC | PRN
  Filled 2014-03-12: qty 5

## 2014-03-12 MED ORDER — SODIUM CHLORIDE 0.9 % IV SOLN: INTRAVENOUS | Status: DC

## 2014-03-12 MED ORDER — FLUTICASONE PROPIONATE 50 MCG/ACT NA SUSP
1.0000 | Freq: Every day | NASAL | Status: DC | PRN
  Filled 2014-03-12: qty 16

## 2014-03-12 MED ORDER — ONDANSETRON HCL 4 MG/2ML IJ SOLN
INTRAMUSCULAR | Status: DC | PRN
  Administered 2014-03-12: 4 mg via INTRAVENOUS

## 2014-03-12 MED ORDER — FENTANYL CITRATE 0.05 MG/ML IJ SOLN
INTRAMUSCULAR | Status: AC
Start: 2014-03-12 — End: 2014-03-12
  Filled 2014-03-12: qty 2

## 2014-03-12 MED ORDER — HYDROCODONE-ACETAMINOPHEN 5-325 MG PO TABS: 1.0000 | ORAL_TABLET | ORAL | Status: DC | PRN

## 2014-03-12 MED ORDER — DIPHENHYDRAMINE HCL 50 MG/ML IJ SOLN
12.5000 mg | Freq: Once | INTRAMUSCULAR | Status: AC
  Administered 2014-03-12: 12.5 mg via INTRAVENOUS

## 2014-03-12 MED ORDER — VANCOMYCIN HCL IN DEXTROSE 1-5 GM/200ML-% IV SOLN
INTRAVENOUS | Status: AC
  Filled 2014-03-12: qty 200

## 2014-03-12 MED ORDER — CISATRACURIUM BESYLATE (PF) 10 MG/5ML IV SOLN
INTRAVENOUS | Status: DC | PRN
  Administered 2014-03-12: 6 mg via INTRAVENOUS

## 2014-03-12 MED ORDER — SODIUM CHLORIDE 0.9 % IJ SOLN
INTRAMUSCULAR | Status: DC | PRN
  Administered 2014-03-12: 30 mL

## 2014-03-12 MED ORDER — HYDROCODONE-ACETAMINOPHEN 5-325 MG PO TABS: 1.0000 | ORAL_TABLET | Freq: Four times a day (QID) | ORAL | Status: DC | PRN

## 2014-03-12 MED ORDER — DOCUSATE SODIUM 100 MG PO CAPS
100.0000 mg | ORAL_CAPSULE | Freq: Two times a day (BID) | ORAL | Status: DC
  Administered 2014-03-12 – 2014-03-13 (×2): 100 mg via ORAL

## 2014-03-12 MED ORDER — ONDANSETRON HCL 4 MG PO TABS: 4.0000 mg | ORAL_TABLET | Freq: Four times a day (QID) | ORAL | Status: DC | PRN

## 2014-03-12 MED ORDER — DEXAMETHASONE SODIUM PHOSPHATE 10 MG/ML IJ SOLN
INTRAMUSCULAR | Status: AC
  Filled 2014-03-12: qty 1

## 2014-03-12 MED ORDER — BUPIVACAINE HCL (PF) 0.25 % IJ SOLN
INTRAMUSCULAR | Status: AC
  Filled 2014-03-12: qty 30

## 2014-03-12 MED ORDER — GLYCOPYRROLATE 0.2 MG/ML IJ SOLN
INTRAMUSCULAR | Status: DC | PRN
  Administered 2014-03-12: 0.6 mg via INTRAVENOUS
  Administered 2014-03-12: 0.2 mg via INTRAVENOUS

## 2014-03-12 MED ORDER — METOPROLOL TARTRATE 12.5 MG HALF TABLET
12.5000 mg | ORAL_TABLET | Freq: Two times a day (BID) | ORAL | Status: DC
  Administered 2014-03-12 – 2014-03-13 (×2): 12.5 mg via ORAL
  Filled 2014-03-12 (×3): qty 1

## 2014-03-12 MED ORDER — MORPHINE SULFATE 2 MG/ML IJ SOLN: 1.0000 mg | INTRAMUSCULAR | Status: DC | PRN

## 2014-03-12 MED ORDER — DEXAMETHASONE SODIUM PHOSPHATE 10 MG/ML IJ SOLN: 10.0000 mg | Freq: Once | INTRAMUSCULAR | Status: DC

## 2014-03-12 MED ORDER — NEOSTIGMINE METHYLSULFATE 10 MG/10ML IV SOLN
INTRAVENOUS | Status: DC | PRN
  Administered 2014-03-12: 4 mg via INTRAVENOUS

## 2014-03-12 MED ORDER — BUPIVACAINE HCL (PF) 0.25 % IJ SOLN
INTRAMUSCULAR | Status: DC | PRN
  Administered 2014-03-12: 20 mL

## 2014-03-12 MED ORDER — FENTANYL CITRATE 0.05 MG/ML IJ SOLN
25.0000 ug | INTRAMUSCULAR | Status: DC | PRN
  Administered 2014-03-12: 50 ug via INTRAVENOUS
  Administered 2014-03-12 (×2): 25 ug via INTRAVENOUS

## 2014-03-12 MED ORDER — CISATRACURIUM BESYLATE 20 MG/10ML IV SOLN
INTRAVENOUS | Status: AC
  Filled 2014-03-12: qty 10

## 2014-03-12 MED ORDER — ACETAMINOPHEN 10 MG/ML IV SOLN
1000.0000 mg | Freq: Once | INTRAVENOUS | Status: AC
  Administered 2014-03-12: 1000 mg via INTRAVENOUS
  Filled 2014-03-12: qty 100

## 2014-03-12 MED ORDER — LACTATED RINGERS IV SOLN
INTRAVENOUS | Status: DC
  Administered 2014-03-12: 1000 mL via INTRAVENOUS
  Administered 2014-03-12: 18:00:00 via INTRAVENOUS

## 2014-03-12 MED ORDER — NITROGLYCERIN 0.4 MG SL SUBL: 0.4000 mg | SUBLINGUAL_TABLET | SUBLINGUAL | Status: DC | PRN

## 2014-03-12 MED ORDER — ONDANSETRON HCL 4 MG/2ML IJ SOLN
INTRAMUSCULAR | Status: AC
  Filled 2014-03-12: qty 2

## 2014-03-12 MED ORDER — ENOXAPARIN SODIUM 40 MG/0.4ML ~~LOC~~ SOLN
40.0000 mg | SUBCUTANEOUS | Status: DC
  Administered 2014-03-13: 40 mg via SUBCUTANEOUS
  Filled 2014-03-12 (×2): qty 0.4

## 2014-03-12 MED ORDER — ETOMIDATE 2 MG/ML IV SOLN
INTRAVENOUS | Status: DC | PRN
  Administered 2014-03-12: 10 mg via INTRAVENOUS

## 2014-03-12 SURGICAL SUPPLY — 35 items
ANCHOR SUPER QUICK (Anchor) ×4 IMPLANT
BAG ZIPLOCK 12X15 (MISCELLANEOUS) IMPLANT
BLADE EXTENDED COATED 6.5IN (ELECTRODE) IMPLANT
DRAPE INCISE IOBAN 66X45 STRL (DRAPES) ×2 IMPLANT
DRAPE ORTHO SPLIT 77X108 STRL (DRAPES) ×4
DRAPE POUCH INSTRU U-SHP 10X18 (DRAPES) ×2 IMPLANT
DRAPE SURG ORHT 6 SPLT 77X108 (DRAPES) ×2 IMPLANT
DRAPE U-SHAPE 47X51 STRL (DRAPES) ×2 IMPLANT
DRSG ADAPTIC 3X8 NADH LF (GAUZE/BANDAGES/DRESSINGS) ×2 IMPLANT
DRSG MEPILEX BORDER 4X8 (GAUZE/BANDAGES/DRESSINGS) ×2 IMPLANT
ELECT REM PT RETURN 9FT ADLT (ELECTROSURGICAL) ×2
ELECTRODE REM PT RTRN 9FT ADLT (ELECTROSURGICAL) ×1 IMPLANT
GAUZE SPONGE 4X4 12PLY STRL (GAUZE/BANDAGES/DRESSINGS) IMPLANT
GLOVE BIO SURGEON STRL SZ7.5 (GLOVE) ×2 IMPLANT
GLOVE BIO SURGEON STRL SZ8 (GLOVE) ×2 IMPLANT
GLOVE BIOGEL PI IND STRL 8 (GLOVE) ×2 IMPLANT
GLOVE BIOGEL PI INDICATOR 8 (GLOVE) ×2
GOWN STRL REUS W/TWL LRG LVL3 (GOWN DISPOSABLE) ×2 IMPLANT
GOWN STRL REUS W/TWL XL LVL3 (GOWN DISPOSABLE) ×2 IMPLANT
KIT BASIN OR (CUSTOM PROCEDURE TRAY) ×2 IMPLANT
MANIFOLD NEPTUNE II (INSTRUMENTS) ×2 IMPLANT
NDL SAFETY ECLIPSE 18X1.5 (NEEDLE) ×2 IMPLANT
NEEDLE HYPO 18GX1.5 SHARP (NEEDLE) ×2
NS IRRIG 1000ML POUR BTL (IV SOLUTION) ×2 IMPLANT
PACK TOTAL JOINT (CUSTOM PROCEDURE TRAY) ×2 IMPLANT
POSITIONER SURGICAL ARM (MISCELLANEOUS) ×2 IMPLANT
STAPLER VISISTAT 35W (STAPLE) IMPLANT
STRIP CLOSURE SKIN 1/2X4 (GAUZE/BANDAGES/DRESSINGS) ×2 IMPLANT
SUT MNCRL AB 4-0 PS2 18 (SUTURE) ×2 IMPLANT
SUT VIC AB 2-0 CT1 27 (SUTURE) ×2
SUT VIC AB 2-0 CT1 TAPERPNT 27 (SUTURE) ×2 IMPLANT
SUT VLOC 180 0 24IN GS25 (SUTURE) ×2 IMPLANT
SYR 20CC LL (SYRINGE) ×2 IMPLANT
SYR 50ML LL SCALE MARK (SYRINGE) ×2 IMPLANT
TOWEL OR 17X26 10 PK STRL BLUE (TOWEL DISPOSABLE) ×4 IMPLANT

## 2014-03-12 NOTE — H&P (Signed)
CC- Lauren House is a 79 y.o. female who presents with left hip pain  Hip Pain: Patient complains of left hip pain. Onset of the symptoms was several months ago. Inciting event: none. Current symptoms include lateral hip pain. Associated symptoms: limp. Aggravating symptoms: lateral movements, pivoting, walking and laying on left side. Patient's pain hs gotten progressively worse over time. Patient has had no prior hip problems. Evaluation to date: MRI with complete gluteus medius tear with retraction.  Treatment to date: injection with short term relief.  Past Medical History  Diagnosis Date  . Essential hypertension, benign   . Osteoporosis   . Arthritis   . Hyperlipidemia     Diet controlled.cannot take meds  . Apical variant hypertrophic cardiomyopathy   . Anxiety   . History of kidney stones   . Vertigo   . History of skin cancer     Basal cell - abdomen  . Nocturia   . Borderline diabetes mellitus   . Coronary atherosclerosis of native coronary artery     Nonobstructive - 50-60% LAD (normal FFR) at catheterization December 2011  . Shortness of breath dyspnea     WITH EXERTION  . Bursitis of left hip   . Continuous leakage of urine     WEARS PAD  . Bruises easily     Past Surgical History  Procedure Laterality Date  . Abdominal hysterectomy    . Hemorrhoid surgery    . Eye surgery      cataracts bilateral  . Inguinal hernia repair  10/11/2011    Procedure: HERNIA REPAIR INGUINAL ADULT;  Surgeon: Stark Klein, MD;  Location: [place];  Service: General;  Laterality: Right;    Prior to Admission medications   Medication Sig Start Date End Date Taking? Authorizing Provider  aspirin EC 81 MG tablet Take 81 mg by mouth daily.   Yes Historical Provider, MD  Cyanocobalamin (VITAMIN B12 PO) Take 1 tablet by mouth daily.   Yes Historical Provider, MD  ibuprofen (ADVIL,MOTRIN) 200 MG tablet Take 400 mg by mouth every 8 (eight) hours as needed for pain.   Yes Historical Provider,  MD  metoprolol tartrate (LOPRESSOR) 25 MG tablet TAKE ONE-HALF TABLET BY MOUTH TWICE DAILY 02/24/13  Yes [name], MD  Multiple Vitamins-Minerals (OCUVITE PO) Take 1 tablet by mouth daily.   Yes Historical Provider, MD  Polyethyl Glycol-Propyl Glycol (SYSTANE OP) Apply 1 drop to eye 3 (three) times daily as needed (Dry eyes).   Yes Historical Provider, MD  ranolazine (RANEXA) 500 MG 12 hr tablet Take 1 tablet (500 mg total) by mouth 2 (two) times daily. 10/02/13  Yes [name], MD  fluticasone (FLONASE) 50 MCG/ACT nasal spray USE TWO SPRAY IN [facility] NOSTRIL EVERY DAY Patient not taking: Reported on 03/04/2014 07/14/12   Susy Frizzle, MD  fluticasone [facility]) 50 MCG/ACT nasal spray Place 1 spray into both nostrils daily as needed for allergies or rhinitis.    Historical Provider, MD  nitroGLYCERIN (NITROSTAT) 0.4 MG SL tablet Place 1 tablet (0.4 mg total) under the tongue every 5 (five) minutes as needed for chest pain. 01/14/13   [name], MD    Physical Examination: General appearance - alert, well appearing, and in no distress Mental status - alert, oriented to person, place, and time Chest - clear to auscultation, no wheezes, rales or rhonchi, symmetric air entry Heart - normal rate, regular rhythm, normal S1, S2, no murmurs, rubs, clicks or gallops Abdomen - soft,  nontender, nondistended, no masses or organomegaly Neurological - alert, oriented, normal speech, no focal findings or movement disorder noted  A left hip exam was performed. SKIN: intact SWELLING: none WARMTH: no warmth TENDERNESS: maximal at greater trochanter ROM: normal STRENGTH: weakness in hip abduction  ASSESSMENT:Left hip bursitis with gluteus medius tear  Plan Left hip bursectomy with gluteal tendon repair. Discussed procedure, risks, potential complications and rehab course with patient who elects to proceed.  Dione Plover Dondi Burandt, MD    03/12/2014, 4:35 PM

## 2014-03-12 NOTE — Anesthesia Preprocedure Evaluation (Addendum)
Anesthesia Evaluation  Patient identified by MRN, date of birth, ID band Patient awake    Reviewed: Allergy & Precautions, H&P , NPO status , Patient's Chart, lab work & pertinent test results  Airway Mallampati: II  TM Distance: >3 FB Neck ROM: full   Comment: Reports TMJ ; has limited opening with Nat teeth Dental no notable dental hx.    Pulmonary neg pulmonary ROS,  breath sounds clear to auscultation  Pulmonary exam normal       Cardiovascular Exercise Tolerance: Good hypertension, + angina + CAD Rhythm:regular Rate:Normal  Normal NM stress test 01/2014   Neuro/Psych Anxiety negative neurological ROS     GI/Hepatic negative GI ROS, Neg liver ROS,   Endo/Other  negative endocrine ROS  Renal/GU negative Renal ROS     Musculoskeletal  (+) Arthritis -,   Abdominal   Peds  Hematology negative hematology ROS (+)   Anesthesia Other Findings Essential hypertension, benign     Apical variant hypertrophic cardiomyopathy -No Sx   Borderline diabetes mellitus    Coronary atherosclerosis- unremarkable cardiac perfusion study Bursitis of left hip             Reproductive/Obstetrics                           Anesthesia Physical Anesthesia Plan  ASA: II  Anesthesia Plan: General   Post-op Pain Management:    Induction: Intravenous  Airway Management Planned: Oral ETT  Additional Equipment:   Intra-op Plan:   Post-operative Plan: Extubation in OR  Informed Consent: I have reviewed the patients History and Physical, chart, labs and discussed the procedure including the risks, benefits and alternatives for the proposed anesthesia with the patient or authorized representative who has indicated his/her understanding and acceptance.   Dental Advisory Given  Plan Discussed with: CRNA  Anesthesia Plan Comments: ( )       Anesthesia Quick Evaluation

## 2014-03-12 NOTE — Anesthesia Postprocedure Evaluation (Signed)
  Anesthesia Post-op Note  Patient: Lauren House  Procedure(s) Performed: Procedure(s): LEFT HIP BURSECTOMY AND GLUTEAL TENDON REPAIR (Left)  Patient Location: PACU  Anesthesia Type:General  Level of Consciousness: awake and alert   Airway and Oxygen Therapy: Patient Spontanous Breathing  Post-op Pain: none  Post-op Assessment: Post-op Vital signs reviewed  Post-op Vital Signs: Reviewed  Last Vitals:  Filed Vitals:   03/12/14 2153  BP: 116/70  Pulse: 79  Temp: 37.4 C  Resp: 17    Complications: No apparent anesthesia complications

## 2014-03-12 NOTE — Anesthesia Procedure Notes (Signed)
Procedure Name: Intubation Date/Time: 03/12/2014 4:55 PM Performed by: Johnathan Hausen A Pre-anesthesia Checklist: Patient identified, Timeout performed, Emergency Drugs available, Suction available and Patient being monitored Patient Re-evaluated:Patient Re-evaluated prior to inductionOxygen Delivery Method: Circle system utilized Preoxygenation: Pre-oxygenation with 100% oxygen Intubation Type: Combination inhalational/ intravenous induction Ventilation: Mask ventilation without difficulty Laryngoscope Size: Mac and 4 Grade View: Grade III Tube type: Oral Tube size: 7.5 mm Number of attempts: 1 Airway Equipment and Method: Stylet Placement Confirmation: ETT inserted through vocal cords under direct vision,  breath sounds checked- equal and bilateral and positive ETCO2 Secured at: 22 cm Tube secured with: Tape Dental Injury: Teeth and Oropharynx as per pre-operative assessment

## 2014-03-12 NOTE — Transfer of Care (Signed)
Immediate Anesthesia Transfer of Care Note  Patient: Lauren House  Procedure(s) Performed: Procedure(s): LEFT HIP BURSECTOMY AND GLUTEAL TENDON REPAIR (Left)  Patient Location: PACU  Anesthesia Type:General  Level of Consciousness: awake, sedated and patient cooperative  Airway & Oxygen Therapy: Patient Spontanous Breathing and Patient connected to face mask oxygen  Post-op Assessment: Report given to PACU RN and Post -op Vital signs reviewed and stable  Post vital signs: Reviewed and stable  Complications: No apparent anesthesia complications

## 2014-03-12 NOTE — Interval H&P Note (Signed)
History and Physical Interval Note:  03/12/2014 4:39 PM  Lauren House  has presented today for surgery, with the diagnosis of LEFT HIP BURSITIS  The various methods of treatment have been discussed with the patient and family. After consideration of risks, benefits and other options for treatment, the patient has consented to  Procedure(s): [place] (Left) as a surgical intervention .  The patient's history has been reviewed, patient examined, no change in status, stable for surgery.  I have reviewed the patient's chart and labs.  Questions were answered to the patient's satisfaction.     [name]

## 2014-03-12 NOTE — Progress Notes (Signed)
Vancomycin 1 gram infused over 1hr 67mins. Patient started complaining of itching on scalp, and left arm.  Visibile whelps/hives noted on left forearm.  MD notified.  Benadryl given as documented.

## 2014-03-12 NOTE — Discharge Instructions (Signed)
Follow up - in two weeks. Call office for appointment at [phone]. Activity - Weight bearing as tolerated to the surgical leg.  No active abduction of the leg (no pulling leg out to the side away from the body).  [place] for first several days until comfortable ambulating. May shower starting three days following surgery. Disposition - Home Condition Upon Discharge - Good D/C Meds - See DC Summary DVT Prophylaxis - Aspirin  HOME CARE INSTRUCTIONS  Remove items at home which could result in a fall. This includes throw rugs or furniture in walking pathways.  Continue medications as instructed at time of discharge.  You may start showering three days following surgery but do not submerge the incision under water. Just pat the incision dry and apply a dry gauze dressing on daily. Sit on high chairs which makes it easier to stand.  Sit on chairs with arms. Use the chair arms to help push yourself up when arising.  You may put full weight on your legs and walk as much as is comfortable. Do not drive while taking narcotics.  Make sure you keep all of your appointments after your operation with all of your doctors and caregivers. You should call the office at the above phone number and make an appointment for approximately two weeks after the date of your surgery. Change the dressing daily and reapply a dry dressing each time. Please pick up a stool softener and laxative for home use as long as you are requiring pain medications.  ICE to the affected hip every three hours for 30 minutes at a time and then as needed for pain and swelling.  Continue to use ice on the hip for pain and swelling from surgery. You may notice swelling that will progress down to the foot and ankle.  This is normal after surgery.  Elevate the leg when you are not up walking on it.   No Active Abduction of the leg (No pulling leg out to the side away from the body).  Use your walker for first several days until comfortable  ambulating.  Pick up stool softner and laxative for home use following surgery while on pain medications. Do not submerge incision under water. Please use good hand washing techniques while changing dressing each day. May shower starting three days after surgery. Please use a clean towel to pat the incision dry following showers. Continue to use ice for pain and swelling after surgery. Do not use any lotions or creams on the incision until instructed by your surgeon.

## 2014-03-12 NOTE — Brief Op Note (Signed)
03/12/2014  5:35 PM  PATIENT:  Lauren House  79 y.o. female  PRE-OPERATIVE DIAGNOSIS:  LEFT HIP BURSITIS  POST-OPERATIVE DIAGNOSIS:  LEFT HIP BURSITIS, GLUTEAL TENDON TEAR  PROCEDURE:  Procedure(s): LEFT HIP BURSECTOMY AND GLUTEAL TENDON REPAIR (Left)  SURGEON:  Surgeon(s) and Role:    * [name], MD - Primary  PHYSICIAN ASSISTANT:   ASSISTANTS: Arlee Muslim, PA-C   ANESTHESIA:   general  EBL:     BLOOD ADMINISTERED:none  DRAINS: none   LOCAL MEDICATIONS USED:  OTHER Exparel  COUNTS:  YES  TOURNIQUET:  * No tourniquets in log *  DICTATION: .Other Dictation: Dictation Number [id number]  PLAN OF CARE: Admit for overnight observation  PATIENT DISPOSITION:  PACU - hemodynamically stable.

## 2014-03-13 DIAGNOSIS — M7072 Other bursitis of hip, left hip: Secondary | ICD-10-CM | POA: Diagnosis not present

## 2014-03-13 DIAGNOSIS — M7070 Other bursitis of hip, unspecified hip: Secondary | ICD-10-CM | POA: Diagnosis present

## 2014-03-13 NOTE — Op Note (Signed)
Lauren House, Lauren House                ACCOUNT NO.:  [id number]  MEDICAL RECORD NO.:  [id number]  LOCATION:  9622                         FACILITY:  [facility]  PHYSICIAN:  Gaynelle Arabian, M.D.    DATE OF BIRTH:  1935/07/24  DATE OF PROCEDURE:  03/12/2014 DATE OF DISCHARGE:                              OPERATIVE REPORT   PREOPERATIVE DIAGNOSIS:  Left hip intractable bursitis with gluteal tendon tear.  POSTOPERATIVE DIAGNOSIS:  Left hip intractable bursitis with gluteal tendon tear.  PROCEDURE:  Left hip bursectomy with gluteal tendon repair.  SURGEON:  Gaynelle Arabian, M.D.  ASSISTANT:  Alexzandrew L. Perkins, P.A.C.  ANESTHESIA:  General.  ESTIMATED BLOOD LOSS:  Minimal.  DRAINS:  None.  COMPLICATIONS:  None.  CONDITION:  Stable to recovery.  BRIEF CLINICAL NOTE:  Lauren House is a 79 year old female, who has a long history of significant lateral hip pain which has been refractory to nonoperative management including  therapy and injection.  She had an MRI last year showing that she had gluteus medius tendon tear.  She has had progressively worsening pain and dysfunction.  She presents now for bursectomy and repair of the gluteal tendon tear.  PROCEDURE IN DETAIL:  After successful administration of general anesthetic, the patient was placed in a right lateral decubitus position with the left side up and held with the hip positioner.  The left lower extremity was isolated from her perineum with plastic drapes and prepped and draped in the usual sterile fashion.  Short lateral based incision was made with a 10 blade through subcutaneous tissue to the fascia lata which was incised in line with the skin incision.  There was very thick, calcified, bursa filled with fluid.  This bursa was excised.  Even after the bursa was excised, there was still some fluid coming from deep to the gluteus medius tendon.  There was a large tear at the middle and anterior portion of the gluteus medius  tendon.  We identified the edge of the tendon.  I placed 2 Mitek anchors into the tip of the greater trochanter, passed the sutures through the tendon and advanced the tendon back to the bone and then over sewed the tendon.  This was a very stable repair.  The tension was restored in the muscle.  The hip was inspected, no other tears noted.  The wound was then copiously irrigated with saline solution and then the Exparel 20 mL mixed with 30 mL of saline was injected into the gluteal muscles, fascia lata, and subcutaneous tissues.  Additional 20 mL of 0.25% Marcaine was injected into the same tissues.  The fascia lata was then closed with #1 V-Loc sutures superiorly.  Small triangle of tissue was then removed at the tip of the trochanter to prevent friction.  The apex of the fascia lata incision was then closed with interrupted Vicryl.  Subcutaneous was then closed with interrupted 2-0 Vicryl and subcuticular running 4-0 Monocryl.  Incisions cleaned and dried.  Steri-Strips and a bulky sterile dressing were applied.  Patient was then awakened and transported to recovery in stable condition.     Gaynelle Arabian, M.D.     FA/MEDQ  D:  03/12/2014  T:  03/13/2014  Job:  [id number]

## 2014-03-13 NOTE — Care Management (Signed)
CARE MANAGEMENT NOTE 03/13/2014  Patient:  Lauren House, Lauren House   Account Number:  [id number]  Date Initiated:  03/13/2014  Documentation initiated by:  Apolonio Schneiders  Subjective/Objective Assessment:   left hip pain     Action/Plan:   Anticipated DC Date:  03/13/2014   Anticipated DC Plan:  [place]  CM consult      PAC Choice  DURABLE MEDICAL EQUIPMENT   Choice offered to / List presented to:  C-1 Patient   DME arranged  Vassie Moselle      DME agency  [place].        Status of service:  Completed, signed off Medicare Important Message given?   (If response is "NO", the following Medicare IM given date fields will be blank) Date Medicare IM given:   Medicare IM given by:   Date Additional Medicare IM given:   Additional Medicare IM given by:    Discharge Disposition:  HOME/SELF CARE  Per UR Regulation:    If discussed at Long Length of Stay Meetings, dates discussed:    Comments:  03/13/14 0940 - Spoke with patient. Quinn notified of RW request and discharge scheduled for today. Venita Sheffield RN BSN CCM [phone]

## 2014-03-13 NOTE — Progress Notes (Signed)
   Subjective: 1 Day Post-Op Procedure(s) (LRB): LEFT HIP BURSECTOMY AND GLUTEAL TENDON REPAIR (Left)  Pt currently having no pain Plan for session of PT then d/c home Patient reports pain as none  Objective:   VITALS:   Filed Vitals:   03/13/14 0647  BP: 121/65  Pulse: 71  Temp: 97.5 F (36.4 C)  Resp: 17    Left hip incision healing well nv intact distally No rashes or edema  Good rom  LABS No results for input(s): HGB, HCT, WBC, PLT in the last 72 hours.  No results for input(s): NA, K, BUN, CREATININE, GLUCOSE in the last 72 hours.   Assessment/Plan: 1 Day Post-Op Procedure(s) (LRB): LEFT HIP BURSECTOMY AND GLUTEAL TENDON REPAIR (Left)  One session of PT today D/c home after therapy F/u in 2 weeks    Merla Riches, MPAS, PA-C  03/13/2014, 7:36 AM

## 2014-03-13 NOTE — Discharge Summary (Signed)
Physician Discharge Summary   Patient ID: Lauren House MRN: [id number] DOB/AGE: [birth year] 77 y.o.  Admit date: 03/12/2014 Discharge date: 03/13/2014  Admission Diagnoses:  Principal Problem:   Bursitis of left hip Active Problems:   Trochanteric bursitis of left hip   Discharge Diagnoses:  Same   Surgeries: Procedure(s): LEFT HIP BURSECTOMY AND GLUTEAL TENDON REPAIR on 03/12/2014   Consultants: PT/OT  Discharged Condition: Stable  Hospital Course: Lauren House is an 79 y.o. female who was admitted 03/12/2014 with a chief complaint of No chief complaint on file. , and found to have a diagnosis of Bursitis of left hip.  They were brought to the operating room on 03/12/2014 and underwent the above named procedures.    The patient had an uncomplicated hospital course and was stable for discharge.  Recent vital signs:  Filed Vitals:   03/13/14 0647  BP: 121/65  Pulse: 71  Temp: 97.5 F (36.4 C)  Resp: 17    Recent laboratory studies:  Results for orders placed or performed during the hospital encounter of 03/09/14  CBC  Result Value Ref Range   WBC 8.4 4.0 - 10.5 K/uL   RBC 5.11 3.87 - 5.11 MIL/uL   Hemoglobin 14.9 12.0 - 15.0 g/dL   HCT 46.0 36.0 - 46.0 %   MCV 90.0 78.0 - 100.0 fL   MCH 29.2 26.0 - 34.0 pg   MCHC 32.4 30.0 - 36.0 g/dL   RDW 13.0 11.5 - 15.5 %   Platelets 254 150 - 400 K/uL  Basic metabolic panel  Result Value Ref Range   Sodium 137 135 - 145 mmol/L   Potassium 4.8 3.5 - 5.1 mmol/L   Chloride 100 96 - 112 mEq/L   CO2 29 19 - 32 mmol/L   Glucose, Bld 111 (H) 70 - 99 mg/dL   BUN 20 6 - 23 mg/dL   Creatinine, Ser 1.03 0.50 - 1.10 mg/dL   Calcium 9.5 8.4 - 10.5 mg/dL   GFR calc non Af Amer 51 (L) >90 mL/min   GFR calc Af Amer 60 (L) >90 mL/min   Anion gap 8 5 - 15    Discharge Medications:     Medication List    STOP taking these medications        ibuprofen 200 MG tablet  Commonly known as:  ADVIL,MOTRIN      TAKE these  medications        aspirin EC 81 MG tablet  Take 81 mg by mouth daily.     fluticasone 50 MCG/ACT nasal spray  Commonly known as:  FLONASE  USE TWO SPRAY IN EACH NOSTRIL EVERY DAY     HYDROcodone-acetaminophen 5-325 MG per tablet  Commonly known as:  NORCO  Take 1-2 tablets by mouth every 6 (six) hours as needed for moderate pain.     methocarbamol 500 MG tablet  Commonly known as:  ROBAXIN  Take 1 tablet (500 mg total) by mouth 4 (four) times daily. As needed for muscle spasm     metoprolol tartrate 25 MG tablet  Commonly known as:  LOPRESSOR  TAKE ONE-HALF TABLET BY MOUTH TWICE DAILY     nitroGLYCERIN 0.4 MG SL tablet  Commonly known as:  NITROSTAT  Place 1 tablet (0.4 mg total) under the tongue every 5 (five) minutes as needed for chest pain.     OCUVITE PO  Take 1 tablet by mouth daily.     ranolazine 500 MG 12 hr tablet  Commonly known  as:  RANEXA  Take 1 tablet (500 mg total) by mouth 2 (two) times daily.     SYSTANE OP  Apply 1 drop to eye 3 (three) times daily as needed (Dry eyes).     VITAMIN B12 PO  Take 1 tablet by mouth daily.        Diagnostic Studies: Dg Chest 2 View  03/09/2014   CLINICAL DATA:  Preoperative examination. Patient for gluteal tendon repair.  EXAM: CHEST  2 VIEW  COMPARISON:  CT chest and single view of the chest 12/23/2012.  FINDINGS: The lungs are clear. Heart size is normal. There is no pneumothorax or pleural effusion.  IMPRESSION: No acute disease.   Electronically Signed   By: Inge Rise M.D.   On: 03/09/2014 10:55    Disposition: 01-Home or Self Care        Follow-up Information    Follow up with [name], MD. Schedule an appointment as soon as possible for a visit on 03/25/2014.   Specialty:  Orthopedic Surgery   Why:  Call [phone] Monday to make the appointment   Contact information:   [address] [place] [address] [phone]        Signed: Ventura Bruns 03/13/2014, 7:37  AM

## 2014-03-13 NOTE — Evaluation (Signed)
Physical Therapy Evaluation Patient Details Name: Lauren House MRN: [id number] DOB: Oct 26, 1935 Today's Date: 03/13/2014   History of Present Illness  s/p L bursectormy and gluteal tendon repair  Clinical Impression  Tolerated session well today, and ready for DC from our standpoint. All education completed with pt and dtr at bedside.     Follow Up Recommendations No PT follow up    Equipment Recommendations  Rolling walker with 5" wheels (relayed RW need to CM this morning)    Recommendations for Other Services       Precautions / Restrictions Precautions Precaution Comments: generally no active hip abduction  Restrictions Weight Bearing Restrictions:  (WBAT)      Mobility  Bed Mobility Overal bed mobility: Needs Assistance             General bed mobility comments: did not perform  but educated dtr on assisting the L LE when getting in adn out of bed to minimize activation on L hip abductor. Dtr aware adn able to assist. Educated with how to do this with demonstartion .   Transfers Overall transfer level: Modified independent Equipment used: Rolling walker (2 wheeled)             General transfer comment: performed nicely  Ambulation/Gait Ambulation/Gait assistance: Supervision Ambulation Distance (Feet): 60 Feet Assistive device: Rolling walker (2 wheeled) Gait Pattern/deviations: Step-to pattern Gait velocity: slow and guarded   General Gait Details: performed nicely, but utilized UEs to support and deweight LLE at times, so feel there is a need for RW for a few days for healing.   Stairs            Wheelchair Mobility    Modified Rankin (Stroke Patients Only)       Balance                                             Pertinent Vitals/Pain Pain Assessment: 0-10 Pain Score: 2  Pain Location: in left hip with movement and when shifting weight to left side when sitting Pain Descriptors / Indicators: Aching Pain  Intervention(s): Monitored during session    Home Living Family/patient expects to be discharged to:: Private residence Living Arrangements: Alone Available Help at Discharge: Family Type of Home: [place]: One level (basement , but no need to go downt here ) Home Equipment: Grab bars - tub/shower Additional Comments: will need RW     Prior Function Level of Independence: Independent         Comments: very active      Hand Dominance        Extremity/Trunk Assessment               Lower Extremity Assessment: Overall WFL for tasks assessed         Communication   Communication: No difficulties  Cognition Arousal/Alertness: Awake/alert Behavior During Therapy: WFL for tasks assessed/performed Overall Cognitive Status: Within Functional Limits for tasks assessed                      General Comments      Exercises        Assessment/Plan    PT Assessment Patent does not need any further PT services  PT Diagnosis Difficulty walking   PT Problem List    PT Treatment Interventions  PT Goals (Current goals can be found in the Care Plan section) Acute Rehab PT Goals Patient Stated Goal: to return home aSAP  PT Goal Formulation: All assessment and education complete, DC therapy    Frequency     Barriers to discharge        Co-evaluation               End of Session Equipment Utilized During Treatment: Gait belt Activity Tolerance: Patient tolerated treatment well Patient left: in chair;with family/visitor present Nurse Communication: Mobility status    Functional Assessment Tool Used: clincal judgement Functional Limitation: Mobility: Walking and moving around Mobility: Walking and Moving Around Current Status (W3888): At least 1 percent but less than 20 percent impaired, limited or restricted Mobility: Walking and Moving Around Goal Status [phone]): At least 1 percent but less than 20 percent impaired, limited  or restricted Mobility: Walking and Moving Around Discharge Status [phone]): At least 1 percent but less than 20 percent impaired, limited or restricted    Time: 0900-0936 PT Time Calculation (min) (ACUTE ONLY): 36 min   Charges:   PT Evaluation $Initial PT Evaluation Tier I: 1 Procedure PT Treatments $Gait Training: 8-22 mins $Therapeutic Activity: 8-22 mins   PT G Codes:   PT G-Codes **NOT FOR INPATIENT CLASS** Functional Assessment Tool Used: clincal judgement Functional Limitation: Mobility: Walking and moving around Mobility: Walking and Moving Around Current Status (X5056): At least 1 percent but less than 20 percent impaired, limited or restricted Mobility: Walking and Moving Around Goal Status [phone]): At least 1 percent but less than 20 percent impaired, limited or restricted Mobility: Walking and Moving Around Discharge Status [phone]): At least 1 percent but less than 20 percent impaired, limited or restricted    Clide Dales 03/13/2014, 9:53 AM  Clide Dales, PT Pager: [phone] 03/13/2014

## 2014-03-13 NOTE — Progress Notes (Signed)
Discharged from floor via w/c, daughter with pt. No changes in assessment.  Townes Fuhs  

## 2014-03-15 ENCOUNTER — Encounter (HOSPITAL_COMMUNITY): Payer: Self-pay | Admitting: Orthopedic Surgery

## 2014-03-26 ENCOUNTER — Other Ambulatory Visit: Payer: Self-pay | Admitting: Internal Medicine

## 2014-03-26 NOTE — Telephone Encounter (Signed)
Rx(s) sent to pharmacy electronically.  

## 2014-05-15 ENCOUNTER — Other Ambulatory Visit: Payer: Self-pay | Admitting: Internal Medicine

## 2014-05-17 NOTE — Telephone Encounter (Signed)
Rx(s) sent to pharmacy electronically.  

## 2014-06-04 ENCOUNTER — Encounter: Payer: Self-pay | Admitting: *Deleted

## 2014-06-04 NOTE — Telephone Encounter (Signed)
Opened in error

## 2014-06-08 ENCOUNTER — Telehealth: Payer: Self-pay | Admitting: *Deleted

## 2014-06-08 NOTE — Telephone Encounter (Signed)
RN had received call from [facility] [phone] - case # [id number] Patient had submitted paperwork but it needed MD signature and patient signature Spoke with patient and communicated this info to her - will likely need new form completed and brought in for MD signature or faxed for MD signature Provided our nurse station fax # Will provide samples to patient as they are available while waiting for [facility] paperwork/approval

## 2014-06-11 NOTE — Telephone Encounter (Signed)
Faxed signed [place] form to [employer] at fax number provided.

## 2014-06-14 ENCOUNTER — Telehealth: Payer: Self-pay | Admitting: *Deleted

## 2014-06-14 NOTE — Telephone Encounter (Signed)
Medication samples have been provided to the patient.  Drug name: ranexa 500  Qty: 42  LOT: [id number]  Exp.Date: 06/2017

## 2014-06-22 NOTE — Telephone Encounter (Signed)
Faxed signed prescription form for Ranexa 500mg  BID to [facility] @ [phone]

## 2014-08-10 ENCOUNTER — Ambulatory Visit: Payer: Medicare Other | Admitting: Internal Medicine

## 2014-08-26 ENCOUNTER — Encounter: Payer: Self-pay | Admitting: Internal Medicine

## 2014-08-26 ENCOUNTER — Ambulatory Visit (INDEPENDENT_AMBULATORY_CARE_PROVIDER_SITE_OTHER): Payer: Medicare Other | Admitting: Internal Medicine

## 2014-08-26 VITALS — BP 140/76 | HR 71 | Ht 67.0 in | Wt 149.0 lb

## 2014-08-26 DIAGNOSIS — I10 Essential (primary) hypertension: Secondary | ICD-10-CM | POA: Diagnosis not present

## 2014-08-26 DIAGNOSIS — I422 Other hypertrophic cardiomyopathy: Secondary | ICD-10-CM

## 2014-08-26 DIAGNOSIS — R0789 Other chest pain: Secondary | ICD-10-CM

## 2014-08-26 MED ORDER — NITROGLYCERIN 0.4 MG SL SUBL: 0.4000 mg | SUBLINGUAL_TABLET | SUBLINGUAL | Status: DC | PRN

## 2014-08-26 NOTE — Patient Instructions (Signed)
Your physician wants you to follow-up in: 6 months with Dr. Hilty. You will receive a reminder letter in the mail two months in advance. If you don't receive a letter, please call our office to schedule the follow-up appointment.    

## 2014-08-26 NOTE — Progress Notes (Signed)
OFFICE NOTE  Chief Complaint:  No complaints  Primary Care Physician: Odette Fraction, MD  HPI:  Lauren House is a pleasant 79 year old female I have been following for history of apical hypertrophic cardiomyopathy for which she has done well on Ranexa 500 mg daily. Recently, she was having right inguinal pain and had a hernia for which she had repair. That, unfortunately, she says has caused her more harm than good and she continues to have more pain in her right hip area which possibly is related to the hip. She saw Dr. Wynelle Link at [facility] for further evaluation from that and has received 2 hip injections which have been beneficial. He did mention that he felt she might need a hip replacement. Recently she had a recurrent episode of chest pain. This caused her to the hospital and improved with nitroglycerin. She was ruled out for MI and discharged. Unfortunately she returned several days later after she developed swelling, warmth and pain in her left forearm. It was felt that she might have possibly had an infected IV site and clearly had superficial thrombophlebitis. She was placed on antibiotics for this and is being treated with some improvement in her symptoms. After her emergency room visit, it was recommended that she increase her Ranexa 500 mg twice daily which is the recommended starting dose. She's had no further chest pain episodes. She had prior cardiac catheterization in 2011 which demonstrated a 50-60% LAD lesion. She underwent FFR for this which was negative with an FFR of 0.99. She had had a stress test at that time which indicated no evidence of reversible ischemia., but was having persistent chest pain.  It is been felt that her chest pain was due to small vessel disease in the setting of significant apical variant hypertrophic cardiomyopathy. This has been treated most effectively with ranolazine, and nitrates to some extent. Although her symptoms now have  escalated. This is concerning to me for possible worsening of her LAD disease.  [name] underwent a nuclear stress test on 02/03/2013. This was negative for ischemia with an EF of 52%. Since then she has reported some improvement in her symptoms. Her main complaints now have to do with left hip pain.  She is still contemplating surgery. She reported that her husband was recently diagnosed with esophageal cancer and it seems to be quite advanced. There did not appear to be any surgical options. This has caused her significant anxiety but she reports no chest pain.  Hani returns today for follow-up. Unfortunately she mentioned that her husband died about 3 months ago. He was diagnosed with esophageal cancer which was aggressive and inoperable. She is still grieving which is to be expected. She continues to have problems with her hip and at this point apparently will need hip surgery, but probably not hip replacement. That is scheduled about one month from now. She does have a history of moderate LAD stenosis in 2011 with a negative stress test one year ago. Recently, she has had worsening shortness of breath and chest pressure with exertion. She initially felt this was due to stress however her symptoms have been somewhat progressive and concern me for ischemia. Her EKG shows stable T-wave inversions consistent with her hypertrophic cardiomyopathy.  I saw [name] back in the office today. Overall she feels like she is doing well. She did have some mild chest discomfort last week and took a nitroglycerin. This is when she was in [place] with her son and  daughter-in-law and they were walking all over town. She managed to walk for several hours without any significant shortness of breath but did have some mild chest discomfort which was relieved fairly quickly with nitroglycerin. Overall she is doing well. She had her hip replacement on the left side which she is recovering from. She still has discomfort  when sitting in a car for long periods of time. She tells me that she's given a be going up to [place] fairly shortly on a vacation and that they will be going up to [facility] for the first time.  PMHx:  Past Medical History  Diagnosis Date  . Essential hypertension, benign   . Osteoporosis   . Arthritis   . Hyperlipidemia     Diet controlled.cannot take meds  . Apical variant hypertrophic cardiomyopathy   . Anxiety   . History of kidney stones   . Vertigo   . History of skin cancer     Basal cell - abdomen  . Nocturia   . Borderline diabetes mellitus   . Coronary atherosclerosis of native coronary artery     Nonobstructive - 50-60% LAD (normal FFR) at catheterization December 2011  . Shortness of breath dyspnea     WITH EXERTION  . Bursitis of left hip   . Continuous leakage of urine     WEARS PAD  . Bruises easily     Past Surgical History  Procedure Laterality Date  . Abdominal hysterectomy    . Hemorrhoid surgery    . Eye surgery      cataracts bilateral  . Inguinal hernia repair  10/11/2011    Procedure: HERNIA REPAIR INGUINAL ADULT;  Surgeon: Stark Klein, MD;  Location: [facility];  Service: General;  Laterality: Right;  . Open surgical repair of gluteal tendon Left 03/12/2014    Procedure: LEFT HIP BURSECTOMY AND GLUTEAL TENDON REPAIR;  Surgeon: [name], MD;  Location: [facility];  Service: Orthopedics;  Laterality: Left;    FAMHx:  Family History  Problem Relation Age of Onset  . Heart disease Father   . Lung cancer Sister   . Lung cancer Brother     SOCHx:   reports that she has never smoked. She has never used smokeless tobacco. She reports that she does not drink alcohol or use illicit drugs.  ALLERGIES:  Allergies  Allergen Reactions  . Chlorhexidine Itching  . Vancomycin Hives  . Aspirin     REACTION: nose bleeds with full strength  . Atorvastatin     REACTION: muscle pain  . Bactrim [Sulfamethoxazole-Trimethoprim]     Doesn't remember    . Ciprofloxacin     REACTION: nausea  . Clarithromycin     Doesn't remember   . Colestipol Hcl     REACTION: hallucinations  . Ezetimibe     REACTION: GI upset  . Penicillins     REACTION: rash  . Simvastatin     REACTION: muscle spasms  . Tape Rash    ROS: A comprehensive review of systems was negative except for: Respiratory: positive for dyspnea on exertion Cardiovascular: positive for exertional chest pressure/discomfort Musculoskeletal: positive for Left hip pain  HOME MEDS: Current Outpatient Prescriptions  Medication Sig Dispense Refill  . aspirin EC 81 MG tablet Take 81 mg by mouth daily.    . Cyanocobalamin (VITAMIN B12 PO) Take 1 tablet by mouth daily.    . fluticasone (FLONASE) 50 MCG/ACT nasal spray USE TWO SPRAY IN EACH NOSTRIL EVERY DAY 50 g  1  . metoprolol tartrate (LOPRESSOR) 25 MG tablet Take 0.5 tablets (12.5 mg total) by mouth 2 (two) times daily. 30 tablet 11  . Multiple Vitamins-Minerals (OCUVITE PO) Take 1 tablet by mouth daily.    . nitroGLYCERIN (NITROSTAT) 0.4 MG SL tablet Place 1 tablet (0.4 mg total) under the tongue every 5 (five) minutes as needed for chest pain. 25 tablet 3  . Polyethyl Glycol-Propyl Glycol (SYSTANE OP) Apply 1 drop to eye 3 (three) times daily as needed (Dry eyes).    . RANEXA 500 MG 12 hr tablet TAKE ONE TABLET BY MOUTH TWICE DAILY 60 tablet 8   No current facility-administered medications for this visit.    LABS/IMAGING: No results found for this or any previous visit (from the past 48 hour(s)). No results found.  VITALS: BP 140/76 mmHg  Pulse 71  Ht 5\' 7"  (1.702 m)  Wt 149 lb (67.586 kg)  BMI 23.33 kg/m2  EXAM: GEN: Awake, in no apparent distress HEENT: PERRLA, EOMI Lungs: Clear Cardiovascular: Regular rate and rhythm normal S1-S2 Abdomen: Soft, nontender positive bowel sounds Extremities: No edema Neurologic: No focal deficits Psychiatric: Appropriately grieving  EKG: Normal sinus rhythm at 71, LVH,  anterolateral T-wave inversions consistent with hypertrophic cardiomyopathy  ASSESSMENT: 1. Apical variant hypertrophic retinopathy - anterolateral TWI's - negative nuclear stress test 01-2013 2. Recurrent chest pain - known 50-60% LAD stenosis (FFR 0.99 in 2011) 3. Hypertension 4. Venous insufficiency 5. Right inguinal pain with a hernia, status post surgical repair 6. Right hip pain with degenerative hip disease - s/p surgery  PLAN: 1.   Mrs. Giza seems to be doing well after hip surgery. She has had very infrequent episodes of angina which showed nitrate responsive. This last episode with was with significant exertion. Her EKG looks of to be stable. She continues on Ranexa twice daily. I will go ahead and refill her nitroglycerin as it's greater than 73 year old. I've encouraged her to continue to work on exercise. Her blood pressure is well-controlled today. Follow-up in 6 months.  [name], MD, [facility] Attending Cardiologist D[place] C Zanasia Hickson 08/26/2014, 4:34 PM

## 2014-10-08 ENCOUNTER — Ambulatory Visit (INDEPENDENT_AMBULATORY_CARE_PROVIDER_SITE_OTHER): Payer: Medicare Other | Admitting: Family Medicine

## 2014-10-08 ENCOUNTER — Encounter: Payer: Self-pay | Admitting: Family Medicine

## 2014-10-08 ENCOUNTER — Telehealth: Payer: Self-pay | Admitting: Family Medicine

## 2014-10-08 VITALS — BP 130/76 | HR 78 | Temp 98.5°F | Resp 14 | Wt 146.0 lb

## 2014-10-08 DIAGNOSIS — J029 Acute pharyngitis, unspecified: Secondary | ICD-10-CM | POA: Diagnosis not present

## 2014-10-08 LAB — RAPID STREP SCREEN (MED CTR MEBANE ONLY): Streptococcus, Group A Screen (Direct): NEGATIVE

## 2014-10-08 MED ORDER — CEFDINIR 300 MG PO CAPS
300.0000 mg | ORAL_CAPSULE | Freq: Two times a day (BID) | ORAL | Status: DC
Start: 2014-10-08 — End: 2014-10-08

## 2014-10-08 MED ORDER — DOXYCYCLINE HYCLATE 100 MG PO TABS: 100.0000 mg | ORAL_TABLET | Freq: Two times a day (BID) | ORAL | Status: DC

## 2014-10-08 MED ORDER — FIRST-DUKES MOUTHWASH MT SUSP
OROMUCOSAL | Status: DC
Start: 2014-10-08 — End: 2015-03-31

## 2014-10-08 NOTE — Telephone Encounter (Signed)
Pharmacy aware fo antibiotic change from Cefdinir to Doxycycline.  Allergy list updated for cephalosporins by provider.  Also they do not have "First [facility] Mouthwash" but can compound it there.  Given OK to do so.

## 2014-10-08 NOTE — Patient Instructions (Signed)
Antibiotics sent to pharmacy Use mouth wash as prescribed F/U as needed

## 2014-10-08 NOTE — Progress Notes (Signed)
Patient ID: Lauren House, female   DOB: 05/01/1935, 79 y.o.   MRN: [id number]   Subjective:    Patient ID: Lauren House, female    DOB: [birth year], 79 y.o.   MRN: [id number]  Patient presents for Severe sore throat and alot of muscous in throat    Patient here with sore throat for the past week she's also had some mucus. She has mild cough which she brings up some blood-tinged mucus on occasion. She feels like her throat is starting to close up some and it burns when she tries to swallow. +sick contacts with some family members at the beach. She denies any fever denies any sinus pressure or drainage denies any nausea or vomiting. Besides her throat she states she feels well. She is a nonsmoker She did recently have a hip replacement done  Review Of Systems: per above   GEN- denies fatigue, fever, weight loss,weakness, recent illness HEENT- denies eye drainage, change in vision, nasal discharge, CVS- denies chest pain, palpitations RESP- denies SOB,+ cough, wheeze ABD- denies N/V, change in stools, abd pain GU- denies dysuria, hematuria, dribbling, incontinence MSK- +joint pain, muscle aches, injury Neuro- denies headache, dizziness, syncope, seizure activity       Objective:    BP 130/76 mmHg  Pulse 78  Temp(Src) 98.5 F (36.9 C) (Oral)  Resp 14  Wt 146 lb (66.225 kg) GEN- NAD, alert and oriented x3 HEENT- PERRL, EOMI, non injected sclera, pink conjunctiva, MMM, oropharynx mild injection, enlarged tonsils TM clear bilat no effusion, no  maxillary sinus tenderness, , nares clear  Neck- Supple, shotty  Ant  LAD CVS- RRR, no murmur RESP-CTAB EXT- No edema Pulses- Radial 2+        Assessment & Plan:      Problem List Items Addressed This Visit    None    Visit Diagnoses    Pharyngitis    -  Primary    Treat for pharyngitis based on age and exam, strep neg, bad gag reflex I dont think we had a good specimen. Dukes magic mouthwash given, she is allergic to multiple  medications, I sent omnicef but she has allergy to this that we were not aware of. Send doxy she has had this before    Relevant Orders    Rapid strep screen (not at [facility]) (Completed)       Note: This dictation was prepared with Dragon dictation along with smaller phrase technology. Any transcriptional errors that result from this process are unintentional.

## 2014-10-15 ENCOUNTER — Telehealth: Payer: Self-pay | Admitting: Internal Medicine

## 2014-10-18 NOTE — Telephone Encounter (Signed)
Close encounter 

## 2014-11-18 ENCOUNTER — Ambulatory Visit (INDEPENDENT_AMBULATORY_CARE_PROVIDER_SITE_OTHER): Payer: Medicare Other | Admitting: *Deleted

## 2014-11-18 DIAGNOSIS — Z23 Encounter for immunization: Secondary | ICD-10-CM

## 2014-11-24 ENCOUNTER — Ambulatory Visit: Payer: Medicare Other

## 2014-11-24 ENCOUNTER — Ambulatory Visit (INDEPENDENT_AMBULATORY_CARE_PROVIDER_SITE_OTHER): Payer: Medicare Other | Admitting: Physician Assistant

## 2014-11-24 ENCOUNTER — Encounter: Payer: Self-pay | Admitting: Physician Assistant

## 2014-11-24 VITALS — BP 144/80 | HR 68 | Temp 98.2°F | Resp 18

## 2014-11-24 DIAGNOSIS — L309 Dermatitis, unspecified: Secondary | ICD-10-CM | POA: Diagnosis not present

## 2014-11-24 NOTE — Progress Notes (Signed)
Patient ID: Lauren House MRN: [id number], DOB: [birth year], 79 y.o. Date of Encounter: 11/24/2014, 10:36 AM    Chief Complaint:  Chief Complaint  Patient presents with  . reaction to flu shot    left arm bruised, sore     HPI: 79 y.o. year old white female reports that she had received her flu shot in her left arm last Thursday. 7 days ago. Is that night the site was swollen and red and warm. Says that it stayed like that through Sunday. Since then says that it is looks like it does now. Yellow coloration at the site spreading 11 cm x 6 cm. She states that she has noticed no other types of symptoms at all. Is been feeling like her usual normal self. Has had no fevers. No increased malaise or fatigue. Note itching no rash no difficulty breathing. No swelling of the throat or irritation itching of the throat.     Home Meds:   Outpatient Prescriptions Prior to Visit  Medication Sig Dispense Refill  . aspirin EC 81 MG tablet Take 81 mg by mouth daily.    . Cyanocobalamin (VITAMIN B12 PO) Take 1 tablet by mouth daily.    . Diphenhyd-Hydrocort-Nystatin (FIRST-DUKES MOUTHWASH) SUSP Swish and gargle 1 teaspoon four times a day as needed for 1 week 1 Bottle 0  . doxycycline (VIBRA-TABS) 100 MG tablet Take 1 tablet (100 mg total) by mouth 2 (two) times daily. 14 tablet 0  . fluticasone (FLONASE) 50 MCG/ACT nasal spray USE TWO SPRAY IN EACH NOSTRIL EVERY DAY 50 g 1  . metoprolol tartrate (LOPRESSOR) 25 MG tablet Take 0.5 tablets (12.5 mg total) by mouth 2 (two) times daily. 30 tablet 11  . Multiple Vitamins-Minerals (OCUVITE PO) Take 1 tablet by mouth daily.    . nitroGLYCERIN (NITROSTAT) 0.4 MG SL tablet Place 1 tablet (0.4 mg total) under the tongue every 5 (five) minutes as needed for chest pain. 25 tablet 3  . Polyethyl Glycol-Propyl Glycol (SYSTANE OP) Apply 1 drop to eye 3 (three) times daily as needed (Dry eyes).    . RANEXA 500 MG 12 hr tablet TAKE ONE TABLET BY MOUTH TWICE DAILY  60 tablet 8   No facility-administered medications prior to visit.    Allergies:  Allergies  Allergen Reactions  . Chlorhexidine Itching  . Vancomycin Hives  . Aspirin     REACTION: nose bleeds with full strength  . Atorvastatin     REACTION: muscle pain  . Bactrim [Sulfamethoxazole-Trimethoprim]     Doesn't remember   . Cephalosporins   . Ciprofloxacin     REACTION: nausea  . Clarithromycin     Doesn't remember   . Colestipol Hcl     REACTION: hallucinations  . Ezetimibe     REACTION: GI upset  . Penicillins     REACTION: rash  . Simvastatin     REACTION: muscle spasms  . Tape Rash      Review of Systems: See HPI for pertinent ROS. All other ROS negative.    Physical Exam: Blood pressure 144/80, pulse 68, temperature 98.2 F (36.8 C), temperature source Oral, resp. rate 18., There is no weight on file to calculate BMI. General:  WNWD WF. Appears in no acute distress. Neck: Supple. No thyromegaly. No lymphadenopathy. Lungs: Clear bilaterally to auscultation without wheezes, rales, or rhonchi. Breathing is unlabored. Heart: Regular rhythm. No murmurs, rubs, or gallops. Msk:  Strength and tone normal for age. Extremities/Skin: Left Upper Arm: Vertical  measurement: 11 cm. Horizontal measuremnt: 6 cm. This area is yellow in color. Unraised.  For the entire arm there is no area of erythema and no area of warmth. Neuro: Alert and oriented X 3. Moves all extremities spontaneously. Gait is normal. CNII-XII grossly in tact. Psych:  Responds to questions appropriately with a normal affect.     ASSESSMENT AND PLAN:  79 y.o. year old female with  1. Dermatitis Told patient that I have never seen anyone with a immunization reaction like this. I reviewed up to date and I see no information regarding him under the adverse effects section of immunizations but reports any thing like this. Reassured her that this should self resolve. Told her that if site worsens in any way to  follow-up immediately.   Signed, [address] [place], [place], [facility] 11/24/2014 10:36 AM

## 2014-12-09 ENCOUNTER — Emergency Department (HOSPITAL_COMMUNITY)
Admission: EM | Admit: 2014-12-09 | Discharge: 2014-12-09 | Disposition: A | Discharge status: Home / Self Care | Payer: Medicare Other | Attending: Emergency Medicine | Admitting: Emergency Medicine

## 2014-12-09 ENCOUNTER — Encounter (HOSPITAL_COMMUNITY): Payer: Self-pay | Admitting: *Deleted

## 2014-12-09 ENCOUNTER — Emergency Department (HOSPITAL_COMMUNITY): Payer: Medicare Other

## 2014-12-09 DIAGNOSIS — Y9289 Other specified places as the place of occurrence of the external cause: Secondary | ICD-10-CM | POA: Diagnosis not present

## 2014-12-09 DIAGNOSIS — S5012XA Contusion of left forearm, initial encounter: Secondary | ICD-10-CM | POA: Diagnosis not present

## 2014-12-09 DIAGNOSIS — Y998 Other external cause status: Secondary | ICD-10-CM | POA: Diagnosis not present

## 2014-12-09 DIAGNOSIS — Z87442 Personal history of urinary calculi: Secondary | ICD-10-CM | POA: Insufficient documentation

## 2014-12-09 DIAGNOSIS — Z79899 Other long term (current) drug therapy: Secondary | ICD-10-CM | POA: Diagnosis not present

## 2014-12-09 DIAGNOSIS — Z7982 Long term (current) use of aspirin: Secondary | ICD-10-CM | POA: Diagnosis not present

## 2014-12-09 DIAGNOSIS — W228XXA Striking against or struck by other objects, initial encounter: Secondary | ICD-10-CM | POA: Insufficient documentation

## 2014-12-09 DIAGNOSIS — I2510 Atherosclerotic heart disease of native coronary artery without angina pectoris: Secondary | ICD-10-CM | POA: Insufficient documentation

## 2014-12-09 DIAGNOSIS — S59912A Unspecified injury of left forearm, initial encounter: Secondary | ICD-10-CM | POA: Diagnosis present

## 2014-12-09 DIAGNOSIS — Z85828 Personal history of other malignant neoplasm of skin: Secondary | ICD-10-CM | POA: Diagnosis not present

## 2014-12-09 DIAGNOSIS — F419 Anxiety disorder, unspecified: Secondary | ICD-10-CM | POA: Insufficient documentation

## 2014-12-09 DIAGNOSIS — Z792 Long term (current) use of antibiotics: Secondary | ICD-10-CM | POA: Insufficient documentation

## 2014-12-09 DIAGNOSIS — Z7951 Long term (current) use of inhaled steroids: Secondary | ICD-10-CM | POA: Diagnosis not present

## 2014-12-09 DIAGNOSIS — Y9389 Activity, other specified: Secondary | ICD-10-CM | POA: Insufficient documentation

## 2014-12-09 DIAGNOSIS — I10 Essential (primary) hypertension: Secondary | ICD-10-CM | POA: Insufficient documentation

## 2014-12-09 DIAGNOSIS — Z87448 Personal history of other diseases of urinary system: Secondary | ICD-10-CM | POA: Insufficient documentation

## 2014-12-09 DIAGNOSIS — Z880 Allergy status to penicillin: Secondary | ICD-10-CM | POA: Diagnosis not present

## 2014-12-09 DIAGNOSIS — M1990 Unspecified osteoarthritis, unspecified site: Secondary | ICD-10-CM | POA: Diagnosis not present

## 2014-12-09 NOTE — ED Provider Notes (Signed)
CSN: [id number]     Arrival date & time 12/09/14  1832 History   First MD Initiated Contact with Patient 12/09/14 2006     Chief Complaint  Patient presents with  . Arm Injury     (Consider location/radiation/quality/duration/timing/severity/associated sxs/prior Treatment) Patient is a 79 y.o. female presenting with arm injury. The history is provided by the patient.  Arm Injury Upper extremity pain location: left forearm. Upper extremity injury: Left forearm injury by a door.   Pain details:    Quality:  Shooting   Severity:  Moderate   Onset quality:  Sudden   Timing:  Intermittent   Progression:  Worsening Chronicity:  New Handedness:  Right-handed Relieved by:  Nothing Worsened by:  Movement Associated symptoms: no fever and no numbness   Risk factors: no recent illness     Past Medical History  Diagnosis Date  . Essential hypertension, benign   . Osteoporosis   . Arthritis   . Hyperlipidemia     Diet controlled.cannot take meds  . Apical variant hypertrophic cardiomyopathy ([place])   . Anxiety   . History of kidney stones   . Vertigo   . History of skin cancer     Basal cell - abdomen  . Nocturia   . Borderline diabetes mellitus   . Coronary atherosclerosis of native coronary artery     Nonobstructive - 50-60% LAD (normal FFR) at catheterization December 2011  . Shortness of breath dyspnea     WITH EXERTION  . Bursitis of left hip   . Continuous leakage of urine     WEARS PAD  . Bruises easily    Past Surgical History  Procedure Laterality Date  . Abdominal hysterectomy    . Hemorrhoid surgery    . Eye surgery      cataracts bilateral  . Inguinal hernia repair  10/11/2011    Procedure: HERNIA REPAIR INGUINAL ADULT;  Surgeon: Stark Klein, MD;  Location: [place];  Service: General;  Laterality: Right;  . Open surgical repair of gluteal tendon Left 03/12/2014    Procedure: LEFT HIP BURSECTOMY AND GLUTEAL TENDON REPAIR;  Surgeon: [name], MD;   Location: [facility];  Service: Orthopedics;  Laterality: Left;   Family History  Problem Relation Age of Onset  . Heart disease Father   . Lung cancer Sister   . Lung cancer Brother    Social History  Substance Use Topics  . Smoking status: Never Smoker   . Smokeless tobacco: Never Used  . Alcohol Use: No   OB History    Gravida Para Term Preterm AB TAB SAB Ectopic Multiple Living   4 4 4       4      Review of Systems  Constitutional: Negative for fever.  Musculoskeletal: Positive for arthralgias.  Psychiatric/Behavioral: The patient is nervous/anxious.   All other systems reviewed and are negative.     Allergies  Chlorhexidine; Vancomycin; Aspirin; Atorvastatin; Bactrim; Cephalosporins; Ciprofloxacin; Clarithromycin; Colestipol hcl; Ezetimibe; Penicillins; Simvastatin; and Tape  Home Medications   Prior to Admission medications   Medication Sig Start Date End Date Taking? Authorizing Provider  aspirin EC 81 MG tablet Take 81 mg by mouth daily.    Historical Provider, MD  Cyanocobalamin (VITAMIN B12 PO) Take 1 tablet by mouth daily.    Historical Provider, MD  Diphenhyd-Hydrocort-Nystatin (FIRST-DUKES MOUTHWASH) SUSP Swish and gargle 1 teaspoon four times a day as needed for 1 week 10/08/14   Alycia Rossetti, MD  doxycycline (VIBRA-TABS)  100 MG tablet Take 1 tablet (100 mg total) by mouth 2 (two) times daily. 10/08/14   Alycia Rossetti, MD  fluticasone [employer] 50 MCG/ACT nasal spray USE TWO SPRAY IN [facility] NOSTRIL EVERY DAY 07/14/12   Susy Frizzle, MD  metoprolol tartrate (LOPRESSOR) 25 MG tablet Take 0.5 tablets (12.5 mg total) by mouth 2 (two) times daily. 03/26/14   [name], MD  Multiple Vitamins-Minerals (OCUVITE PO) Take 1 tablet by mouth daily.    Historical Provider, MD  nitroGLYCERIN (NITROSTAT) 0.4 MG SL tablet Place 1 tablet (0.4 mg total) under the tongue every 5 (five) minutes as needed for chest pain. 08/26/14   [name], MD  Polyethyl  Glycol-Propyl Glycol (SYSTANE OP) Apply 1 drop to eye 3 (three) times daily as needed (Dry eyes).    Historical Provider, MD  RANEXA 500 MG 12 hr tablet TAKE ONE TABLET BY MOUTH TWICE DAILY 05/17/14   [name], MD   BP 171/78 mmHg  Pulse 78  Temp(Src) 98 F (36.7 C) (Oral)  Ht 5\' 7"  (1.702 m)  Wt 146 lb (66.225 kg)  BMI 22.86 kg/m2  SpO2 98% Physical Exam  Constitutional: She is oriented to person, place, and time. She appears well-developed and well-nourished.  Non-toxic appearance.  HENT:  Head: Normocephalic.  Right Ear: Tympanic membrane and external ear normal.  Left Ear: Tympanic membrane and external ear normal.  Eyes: EOM and lids are normal. Pupils are equal, round, and reactive to light.  Neck: Normal range of motion. Neck supple. Carotid bruit is not present.  Cardiovascular: Normal rate, regular rhythm, normal heart sounds, intact distal pulses and normal pulses.   Pulmonary/Chest: Breath sounds normal. No respiratory distress.  Abdominal: Soft. Bowel sounds are normal. There is no tenderness. There is no guarding.  Musculoskeletal: Normal range of motion.       Left forearm: She exhibits tenderness.  Bruise left forearm.  Lymphadenopathy:       Head (right side): No submandibular adenopathy present.       Head (left side): No submandibular adenopathy present.    She has no cervical adenopathy.  Neurological: She is alert and oriented to person, place, and time. She has normal strength. No cranial nerve deficit or sensory deficit.  Skin: Skin is warm and dry.  Psychiatric: She has a normal mood and affect. Her speech is normal.  Nursing note and vitals reviewed.   ED Course  Procedures (including critical care time) Labs Review Labs Reviewed - No data to display  Imaging Review Dg Forearm Left  12/09/2014  CLINICAL DATA:  Mid shaft forearm pain and bruising. Struck alarming in door today. EXAM: LEFT FOREARM - 2 VIEW COMPARISON:  None. FINDINGS: There is no  evidence of fracture or other focal bone lesions. Soft tissues are unremarkable. IMPRESSION: Negative. Electronically Signed   By: Van Clines M.D.   On: 12/09/2014 19:08   I have personally reviewed and evaluated these images and lab results as part of my medical decision-making.   EKG Interpretation None      MDM  Patient sustained an injury to the left forearm with her arm got stuck in a door . x-ray of the forearm is negative for fracture or dislocation. The patient is not on any anticoagulation medications. The pulses are symmetrical. There is no evidence of tendon injury.  The patient will use an ice pack, Tylenol and ibuprofen for soreness. She will return to the emergency department if any changes,  problems, or concerns.    Final diagnoses:  None    **I have reviewed nursing notes, vital signs, and all appropriate lab and imaging results for this patient.Lily Kocher, PA-C 12/09/14 2046  Milton Ferguson, MD 12/11/14 1003

## 2014-12-09 NOTE — ED Notes (Signed)
Ice pack applied to left forearm.

## 2014-12-09 NOTE — ED Notes (Signed)
Pt states her sleeve got caught on a door and her arm hit the door, causing a hematoma to the forearm. Pt states throbbing to the area.

## 2014-12-09 NOTE — Discharge Instructions (Signed)
Your x-ray is negative for fracture or dislocation. No acute changes on the examination. Please apply ice. Please use Tylenol and ibuprofen for soreness. Please return to the emergency department if any changes, problems, or concerns. Contusion A contusion is a deep bruise. Contusions happen when an injury causes bleeding under the skin. Symptoms of bruising include pain, swelling, and discolored skin. The skin may turn blue, purple, or yellow. HOME CARE   Rest the injured area.  If told, put ice on the injured area.  Put ice in a plastic bag.  Place a towel between your skin and the bag.  Leave the ice on for 20 minutes, 2-3 times per day.  If told, put light pressure (compression) on the injured area using an elastic bandage. Make sure the bandage is not too tight. Remove it and put it back on as told by your doctor.  If possible, raise (elevate) the injured area above the level of your heart while you are sitting or lying down.  Take over-the-counter and prescription medicines only as told by your doctor. GET HELP IF:  Your symptoms do not get better after several days of treatment.  Your symptoms get worse.  You have trouble moving the injured area. GET HELP RIGHT AWAY IF:   You have very bad pain.  You have a loss of feeling (numbness) in a hand or foot.  Your hand or foot turns pale or cold.   This information is not intended to replace advice given to you by your health care provider. Make sure you discuss any questions you have with your health care provider.   Document Released: 08/01/2007 Document Revised: 11/03/2014 Document Reviewed: 06/30/2014 Elsevier Interactive Patient Education [employer].

## 2015-02-08 ENCOUNTER — Ambulatory Visit (HOSPITAL_COMMUNITY): Payer: Medicare Other | Attending: Physician Assistant | Admitting: Physical Therapy

## 2015-02-08 DIAGNOSIS — M7062 Trochanteric bursitis, left hip: Secondary | ICD-10-CM

## 2015-02-08 DIAGNOSIS — R29898 Other symptoms and signs involving the musculoskeletal system: Secondary | ICD-10-CM | POA: Diagnosis present

## 2015-02-08 DIAGNOSIS — R6889 Other general symptoms and signs: Secondary | ICD-10-CM

## 2015-02-08 DIAGNOSIS — M25552 Pain in left hip: Secondary | ICD-10-CM

## 2015-02-08 DIAGNOSIS — R262 Difficulty in walking, not elsewhere classified: Secondary | ICD-10-CM

## 2015-02-08 DIAGNOSIS — Z658 Other specified problems related to psychosocial circumstances: Secondary | ICD-10-CM | POA: Diagnosis present

## 2015-02-08 DIAGNOSIS — Z789 Other specified health status: Secondary | ICD-10-CM

## 2015-02-08 NOTE — Patient Instructions (Signed)
Bridging    Slowly raise buttocks from floor, keeping stomach tight. Repeat ____ times per set. Do ____ sets per session. Do ____ sessions per day.  http://orth.exer.us/1097   Copyright  VHI. All rights reserved.  Hip Abduction / Adduction: with Knee Flexion (Supine)    With right knee bent, gently lower knee to side and return. Repeat ____ times per set. Do ____ sets per session. Do ____ sessions per day.  http://orth.exer.us/683   Copyright  VHI. All rights reserved.

## 2015-02-08 NOTE — Therapy (Signed)
[place], [place], [address] Phone: [phone]   Fax:  [phone]  Physical Therapy Evaluation  Patient Details  Name: Lauren House MRN: [id number] Date of Birth: 09-May-1935 Referring Provider: Gerrit Halls  Encounter Date: 02/08/2015      PT End of Session - 02/08/15 0852    Visit Number 1   Number of Visits 10   Date for PT Re-Evaluation 03/11/15   Authorization Type BCBS Medicare   Authorization Time Period 02/08/15-04/11/15   Authorization - Visit Number 1   Authorization - Number of Visits 10   PT Start Time 0800   PT Stop Time 0840   PT Time Calculation (min) 40 min   Activity Tolerance Patient tolerated treatment well   Behavior During Therapy [facility] for tasks assessed/performed      Past Medical History  Diagnosis Date  . Essential hypertension, benign   . Osteoporosis   . Arthritis   . Hyperlipidemia     Diet controlled.cannot take meds  . Apical variant hypertrophic cardiomyopathy ([place])   . Anxiety   . History of kidney stones   . Vertigo   . History of skin cancer     Basal cell - abdomen  . Nocturia   . Borderline diabetes mellitus   . Coronary atherosclerosis of native coronary artery     Nonobstructive - 50-60% LAD (normal FFR) at catheterization December 2011  . Shortness of breath dyspnea     WITH EXERTION  . Bursitis of left hip   . Continuous leakage of urine     WEARS PAD  . Bruises easily     Past Surgical History  Procedure Laterality Date  . Abdominal hysterectomy    . Hemorrhoid surgery    . Eye surgery      cataracts bilateral  . Inguinal hernia repair  10/11/2011    Procedure: HERNIA REPAIR INGUINAL ADULT;  Surgeon: Stark Klein, MD;  Location: [place];  Service: General;  Laterality: Right;  . Open surgical repair of gluteal tendon Left 03/12/2014    Procedure: LEFT HIP BURSECTOMY AND GLUTEAL TENDON REPAIR;  Surgeon: [name], MD;  [facility];   Service: Orthopedics;  Laterality: Left;    There were no vitals filed for this visit.  Visit Diagnosis:  Left hip pain  Trochanteric bursitis of left hip  Weakness of both legs  Difficulty navigating stairs  Difficulty walking      Subjective Assessment - 02/08/15 0805    Subjective Pt reports that she had surgery on her hip in January for bursitis and a torn tendon in her L hip. About a month ago, pt fell in her yard and landed on her L hip and knee, and since then, she has been having trouble with her L leg. She received a cortisone injection in her L knee on 01/26/15. Imaging was done to rule out any injuries. She reports that she is also having trouble with the bottom of her R foot. She has been having some trouble walking and going up and down the stairs to her basement.    How long can you sit comfortably? about an hour   How long can you stand comfortably? an hour and a half or longer   How long can you walk comfortably? about an hour and a half   Patient Stated Goals Decrease hip pain, improve walking   Currently in Pain? Yes   Pain Score 1  Pain Location Hip   Pain Orientation Left   Pain Descriptors / Indicators Tingling            [facility] PT Assessment - 02/08/15 0001    Assessment   Medical Diagnosis L hip bursitis   Referring Provider Gerrit Halls   Onset Date/Surgical Date 01/07/15   Next MD Visit January 2017   Prior Therapy yes-prior to surgery in January 2016   Precautions   Precautions None   Restrictions   Weight Bearing Restrictions No   Balance Screen   Has the patient fallen in the past 6 months Yes   How many times? 1   Has the patient had a decrease in activity level because of a fear of falling?  Yes   Is the patient reluctant to leave their home because of a fear of falling?  No   Home Environment   Living Environment Private residence   Living Arrangements Alone   Type of [place] Access Level entry   Home Layout Two level   has to go down to basement 2-3 times per day   Alternate Level Stairs-Number of Steps 15   Alternate Level Stairs-Rails Right;Left   Prior Function   Level of Independence Independent   Vocation Retired   Public librarian, Psychologist, occupational work, Barrister's clerk   Observation/Other Assessments   Focus on Therapeutic Outcomes (FOTO)  46% limited   Functional Tests   Functional tests Single leg stance   Single Leg Stance   Comments 7" on LLE   ROM / Strength   AROM / PROM / Strength Strength;PROM   PROM   PROM Assessment Site Hip   Right/Left Hip Right;Left   Right Hip External Rotation  32   Right Hip Internal Rotation  40   Left Hip Flexion 105   Left Hip External Rotation  35   Left Hip Internal Rotation  22   Left Hip ABduction 23   Left Hip ADduction 8   Strength   Strength Assessment Site Hip;Knee   Right Hip Flexion 4-/5   Right Hip Extension 3+/5   Right Hip ABduction 4-/5   Left Hip Flexion 3+/5   Left Hip Extension 3-/5   Left Hip ABduction 3-/5   Right/Left Knee Right;Left   Right Knee Flexion 4-/5   Right Knee Extension 4/5   Left Knee Flexion 3-/5   Left Knee Extension 4/5   Palpation   Palpation comment tenderness with palpation over L greater trochanter, L ITB, L glut med   Transfers   Five time sit to stand comments  19.40"   Ambulation/Gait   Gait Comments TUG 11.56"                PT Education - 02/08/15 0852    Education provided Yes   Education Details HEP, prognosis   Person(s) Educated Patient   Methods Explanation;Handout   Comprehension Verbalized understanding;Returned demonstration          PT Short Term Goals - 02/08/15 1501    PT SHORT TERM GOAL #1   Title Pt will be independent with HEP.   Time 2   Period Weeks   Status New   PT SHORT TERM GOAL #2   Title Improve L hip IR, abduction, and adduction ROM by 8 degrees or more without pain to improve mobility of L hip and improve gait mechanics.    Time 2   Period Weeks   Status New    PT SHORT  TERM GOAL #3   Title Improve strength of L hip to 4-/5 or greater to improve pt's ability to climb stairs.   Time 2   Period Weeks   Status New   PT SHORT TERM GOAL #4   Title Pt will maintain SLS on LLE for 15 seconds or more to demonstrate improved balance and decrease risk for falls.   Time 2   Period Weeks   Status New           PT Long Term Goals - 03-Mar-2015 1504    PT LONG TERM GOAL #1   Title Improve L hip ROM to be equal to R side with 0/10 pain to allow for pt to complete tasks such as donning pantyhose without pain.    Time 4   Period Weeks   Status New   PT LONG TERM GOAL #2   Title Improve strength of L hip and knee to 4+/5 or greater to allow for pt to ascend/descend 15 steps reciprocally without increased pain.   Time 4   Period Weeks   Status New   PT LONG TERM GOAL #3   Title Pt will complete five time sit to stand in 13 seconds or less to demonstrate improved functional mobility and LE strength.    Time 4   Period Weeks   Status New   PT LONG TERM GOAL #4   Title Pt will maintain SLS x 30 seconds or greater to demonstrate improved balance and decreased risk for falls.    Time 4   Period Weeks   Status New   PT LONG TERM GOAL #5   Title Pt will ambulate 1,000 feet with proper gait mechanics and <2/10 pain to return pt to community ambulation.    Time 4   Period Weeks   Status New               Plan - 03/03/15 1229    Clinical Impression Statement Pt presents to PT with c/o L hip pain following a fall about one month ago. Pt underwent L hip bursecotmy and gluteal tendon repair in January 2016. At this time, pt demonstrates decreased L hip ROM in IR, adduction, and abduction, decreased strength of LLE, impaired balance, impaired functional mobility, and decreased functional activity tolerance. Pt also has increased tenderness over her L greater trochanter, with symptoms of irritation of trochanteric bursa. Pt will benefit from skilled PT  services at this time to address her impairments in order to return pt to PLOF.    Pt will benefit from skilled therapeutic intervention in order to improve on the following deficits Abnormal gait;Decreased activity tolerance;Decreased balance;Decreased mobility;Decreased range of motion;Decreased strength;Difficulty walking;Pain   Rehab Potential Good   PT Frequency 2x / week   PT Duration 4 weeks   PT Treatment/Interventions ADLs/Self Care Home Management;Cryotherapy;Gait training;Stair training;Functional mobility training;Therapeutic activities;Therapeutic exercise;Balance training;Neuromuscular re-education;Patient/family education;Manual techniques;Passive range of motion   PT Next Visit Plan Review goals and HEP, continue with mat exercises for hip strengthening, begin functional stretching of hamstrings, ITB          G-Codes - Mar 03, 2015 1532    Functional Assessment Tool Used FOTO   Functional Limitation Mobility: Walking and moving around   Mobility: Walking and Moving Around Current Status [id number]) At least 40 percent but less than 60 percent impaired, limited or restricted   Mobility: Walking and Moving Around Goal Status [id number]) At least 40 percent but less than 60 percent impaired, limited or restricted  Problem List Patient Active Problem List   Diagnosis Date Noted  . Hip bursitis 03/13/2014  . Trochanteric bursitis of left hip 03/12/2014  . Exertional chest pain 02/10/2014  . Preoperative cardiovascular examination 02/10/2014  . Angina pectoris, variant ([place]) 12/24/2012  . Left hip pain 12/16/2012  . Bursitis of left hip 12/16/2012  . Osteoarthritis, hip, bilateral 12/16/2012  . Chest pain 08/15/2012  . Apical variant hypertrophic cardiomyopathy ([place]) 08/15/2012  . Bilateral inguinal hernia, right symptomatic 09/21/2011  . COMEDO 10/18/2008  . POSTMENOPAUSAL OSTEOPOROSIS 09/01/2008  . HIP PAIN, LEFT 01/19/2008  . CARCINOMA, BASAL CELL, SKIN 10/02/2006  .  VARICOSE VEIN 09/24/2006  . ACTINIC KERATOSIS 09/24/2006  . NECK PAIN 09/24/2006  . BACK PAIN 09/24/2006  . NEOP, BNG, SCALP/SKIN, NECK 04/16/2006  . FIBROIDS, UTERUS 04/16/2006  . Hyperlipidemia 04/16/2006  . MACULAR DEGENERATION 04/16/2006  . Essential hypertension 04/16/2006  . ALLERGIC RHINITIS 04/16/2006  . OVERACTIVE BLADDER 04/16/2006  . URINARY INCONTINENCE 04/16/2006  . SKIN CANCER, HX OF 04/16/2006  . VERTIGO, HX OF 04/16/2006  . RENAL CALCULUS, HX OF 04/16/2006    Lauren House, PT, DPT [phone] 02/08/2015, 4:14 PM  [place] [address] [place], [place], [address] Phone: [phone]   Fax:  [phone]  Name: Lauren House MRN: [id number] Date of Birth: 04/21/1935

## 2015-02-10 ENCOUNTER — Ambulatory Visit (HOSPITAL_COMMUNITY): Payer: Medicare Other | Admitting: Physical Therapy

## 2015-02-10 DIAGNOSIS — M7062 Trochanteric bursitis, left hip: Secondary | ICD-10-CM

## 2015-02-10 DIAGNOSIS — Z789 Other specified health status: Secondary | ICD-10-CM

## 2015-02-10 DIAGNOSIS — M25552 Pain in left hip: Secondary | ICD-10-CM

## 2015-02-10 DIAGNOSIS — R262 Difficulty in walking, not elsewhere classified: Secondary | ICD-10-CM

## 2015-02-10 DIAGNOSIS — R6889 Other general symptoms and signs: Secondary | ICD-10-CM

## 2015-02-10 DIAGNOSIS — R29898 Other symptoms and signs involving the musculoskeletal system: Secondary | ICD-10-CM

## 2015-02-10 NOTE — Therapy (Signed)
[place], [place], [address] Phone: [phone]   Fax:  [phone]  Physical Therapy Treatment  Patient Details  Name: Lauren House MRN: [id number] Date of Birth: 15-Dec-1935 Referring Provider: Gerrit Halls  Encounter Date: 02/10/2015      PT End of Session - 02/10/15 1206    Visit Number 2   Number of Visits 10   Date for PT Re-Evaluation 03/11/15   Authorization Type BCBS Medicare   Authorization Time Period 02/08/15-04/11/15   Authorization - Visit Number 2   Authorization - Number of Visits 10   PT Start Time [id number]   PT Stop Time 1058   PT Time Calculation (min) 43 min   Activity Tolerance Patient tolerated treatment well   Behavior During Therapy [facility] for tasks assessed/performed      Past Medical History  Diagnosis Date  . Essential hypertension, benign   . Osteoporosis   . Arthritis   . Hyperlipidemia     Diet controlled.cannot take meds  . Apical variant hypertrophic cardiomyopathy ([place])   . Anxiety   . History of kidney stones   . Vertigo   . History of skin cancer     Basal cell - abdomen  . Nocturia   . Borderline diabetes mellitus   . Coronary atherosclerosis of native coronary artery     Nonobstructive - 50-60% LAD (normal FFR) at catheterization December 2011  . Shortness of breath dyspnea     WITH EXERTION  . Bursitis of left hip   . Continuous leakage of urine     WEARS PAD  . Bruises easily     Past Surgical History  Procedure Laterality Date  . Abdominal hysterectomy    . Hemorrhoid surgery    . Eye surgery      cataracts bilateral  . Inguinal hernia repair  10/11/2011    Procedure: HERNIA REPAIR INGUINAL ADULT;  Surgeon: Stark Klein, MD;  Location: [place];  Service: General;  Laterality: Right;  . Open surgical repair of gluteal tendon Left 03/12/2014    Procedure: LEFT HIP BURSECTOMY AND GLUTEAL TENDON REPAIR;  Surgeon: [name], MD;  Location[facility];   Service: Orthopedics;  Laterality: Left;    There were no vitals filed for this visit.  Visit Diagnosis:  Left hip pain  Trochanteric bursitis of left hip  Weakness of both legs  Difficulty navigating stairs  Difficulty walking      Subjective Assessment - 02/10/15 1018    Subjective Pt reports that her hip feels pretty good today, she did her HEP this morning and did not have any problems.    Currently in Pain? No/denies   Pain Score 0-No pain                         [facility] Adult PT Treatment/Exercise - 02/10/15 0001    Exercises   Exercises Knee/Hip   Knee/Hip Exercises: Stretches   Active Hamstring Stretch 30 seconds;3 reps   Piriformis Stretch 30 seconds;2 reps   Knee/Hip Exercises: Standing   Hip Abduction 10 reps;Both   Hip Extension 10 reps;2 sets;Both   Forward Step Up 10 reps;2 sets;Both;Step Height: 4"   Knee/Hip Exercises: Seated   Sit to Sand 10 reps;2 sets   Knee/Hip Exercises: Supine   Short Arc Quad Sets 15 reps;2 sets   Bridges 15 reps   Straight Leg Raises 10 reps;Both   Other Supine Knee/Hip Exercises  isometric hip abduction, 3" x 10   Knee/Hip Exercises: Sidelying   Clams 10 reps   Manual Therapy   Manual Therapy Soft tissue mobilization   Manual therapy comments performed following therex   Soft tissue mobilization soft tissue mobilization to L IT band, lateral quads, lateral hamstrings                  PT Short Term Goals - 02/08/15 1501    PT SHORT TERM GOAL #1   Title Pt will be independent with HEP.   Time 2   Period Weeks   Status New   PT SHORT TERM GOAL #2   Title Improve L hip IR, abduction, and adduction ROM by 8 degrees or more without pain to improve mobility of L hip and improve gait mechanics.    Time 2   Period Weeks   Status New   PT SHORT TERM GOAL #3   Title Improve strength of L hip to 4-/5 or greater to improve pt's ability to climb stairs.   Time 2   Period Weeks   Status New   PT SHORT  TERM GOAL #4   Title Pt will maintain SLS on LLE for 15 seconds or more to demonstrate improved balance and decrease risk for falls.   Time 2   Period Weeks   Status New           PT Long Term Goals - 02/08/15 1504    PT LONG TERM GOAL #1   Title Improve L hip ROM to be equal to R side with 0/10 pain to allow for pt to complete tasks such as donning pantyhose without pain.    Time 4   Period Weeks   Status New   PT LONG TERM GOAL #2   Title Improve strength of L hip and knee to 4+/5 or greater to allow for pt to ascend/descend 15 steps reciprocally without increased pain.   Time 4   Period Weeks   Status New   PT LONG TERM GOAL #3   Title Pt will complete five time sit to stand in 13 seconds or less to demonstrate improved functional mobility and LE strength.    Time 4   Period Weeks   Status New   PT LONG TERM GOAL #4   Title Pt will maintain SLS x 30 seconds or greater to demonstrate improved balance and decreased risk for falls.    Time 4   Period Weeks   Status New   PT LONG TERM GOAL #5   Title Pt will ambulate 1,000 feet with proper gait mechanics and <2/10 pain to return pt to community ambulation.    Time 4   Period Weeks   Status New               Plan - 02/10/15 1206    Clinical Impression Statement Pt's HEP and goals reviewed. Treatment session focused on functional stretching, strengthening, and manual therapy to decrease inflammation and pain at greater trochanter. Pt was able to complete all therex with no c/o pain in today's treatment. She required verbal and tactile cueing for proper form with clamshells, standing hip extension, and standing hip abduction. Manual therapy was performed following therex, pt reported decreased pain following manual therapy.    PT Next Visit Plan Continue with functional strengthening, add gentle ITB stretching        Problem List Patient Active Problem List   Diagnosis Date Noted  . Hip bursitis  03/13/2014  .  Trochanteric bursitis of left hip 03/12/2014  . Exertional chest pain 02/10/2014  . Preoperative cardiovascular examination 02/10/2014  . Angina pectoris, variant ([place]) 12/24/2012  . Left hip pain 12/16/2012  . Bursitis of left hip 12/16/2012  . Osteoarthritis, hip, bilateral 12/16/2012  . Chest pain 08/15/2012  . Apical variant hypertrophic cardiomyopathy ([place]) 08/15/2012  . Bilateral inguinal hernia, right symptomatic 09/21/2011  . COMEDO 10/18/2008  . POSTMENOPAUSAL OSTEOPOROSIS 09/01/2008  . HIP PAIN, LEFT 01/19/2008  . CARCINOMA, BASAL CELL, SKIN 10/02/2006  . VARICOSE VEIN 09/24/2006  . ACTINIC KERATOSIS 09/24/2006  . NECK PAIN 09/24/2006  . BACK PAIN 09/24/2006  . NEOP, BNG, SCALP/SKIN, NECK 04/16/2006  . FIBROIDS, UTERUS 04/16/2006  . Hyperlipidemia 04/16/2006  . MACULAR DEGENERATION 04/16/2006  . Essential hypertension 04/16/2006  . ALLERGIC RHINITIS 04/16/2006  . OVERACTIVE BLADDER 04/16/2006  . URINARY INCONTINENCE 04/16/2006  . SKIN CANCER, HX OF 04/16/2006  . VERTIGO, HX OF 04/16/2006  . RENAL CALCULUS, HX OF 04/16/2006    Hilma Favors, PT, DPT [phone]  02/10/2015, 12:10 PM  [place], [place], [address] Phone: [phone]   Fax:  [phone]  Name: Lauren House MRN: [id number] Date of Birth: 1936/01/02

## 2015-02-14 ENCOUNTER — Encounter: Payer: Self-pay | Admitting: Internal Medicine

## 2015-02-14 ENCOUNTER — Ambulatory Visit (INDEPENDENT_AMBULATORY_CARE_PROVIDER_SITE_OTHER): Payer: Medicare Other | Admitting: Internal Medicine

## 2015-02-14 VITALS — BP 144/86 | HR 59 | Ht 67.0 in | Wt 147.0 lb

## 2015-02-14 DIAGNOSIS — R072 Precordial pain: Secondary | ICD-10-CM | POA: Diagnosis not present

## 2015-02-14 DIAGNOSIS — I201 Angina pectoris with documented spasm: Secondary | ICD-10-CM

## 2015-02-14 DIAGNOSIS — I422 Other hypertrophic cardiomyopathy: Secondary | ICD-10-CM | POA: Diagnosis not present

## 2015-02-14 DIAGNOSIS — R079 Chest pain, unspecified: Secondary | ICD-10-CM | POA: Diagnosis not present

## 2015-02-14 DIAGNOSIS — I10 Essential (primary) hypertension: Secondary | ICD-10-CM | POA: Diagnosis not present

## 2015-02-14 DIAGNOSIS — R002 Palpitations: Secondary | ICD-10-CM | POA: Insufficient documentation

## 2015-02-14 NOTE — Patient Instructions (Signed)
Medication Instructions:  Please continue your current medications  Labwork: NONE  Testing/Procedures: 1. [place] Nuclear Stress test - Your physician has requested that you have a lexiscan myoview. For further information please visit [email or web address]. Please follow instruction sheet, as given.  Follow-Up: Dr Debara Pickett recommends that you schedule a follow-up appointment in JANUARY 2017 - [place].  If you need a refill on your cardiac medications before your next appointment, please call your pharmacy.

## 2015-02-14 NOTE — Progress Notes (Signed)
OFFICE NOTE  Chief Complaint:  Chest pain and palpitations  Primary Care Physician: Odette Fraction, MD  HPI:  Lauren House is a pleasant 79 year old female I have been following for history of apical hypertrophic cardiomyopathy for which she has done well on Ranexa 500 mg daily. Recently, she was having right inguinal pain and had a hernia for which she had repair. That, unfortunately, she says has caused her more harm than good and she continues to have more pain in her right hip area which possibly is related to the hip. She saw Dr. Wynelle Link at [facility] for further evaluation from that and has received 2 hip injections which have been beneficial. He did mention that he felt she might need a hip replacement. Recently she had a recurrent episode of chest pain. This caused her to the hospital and improved with nitroglycerin. She was ruled out for MI and discharged. Unfortunately she returned several days later after she developed swelling, warmth and pain in her left forearm. It was felt that she might have possibly had an infected IV site and clearly had superficial thrombophlebitis. She was placed on antibiotics for this and is being treated with some improvement in her symptoms. After her emergency room visit, it was recommended that she increase her Ranexa 500 mg twice daily which is the recommended starting dose. She's had no further chest pain episodes. She had prior cardiac catheterization in 2011 which demonstrated a 50-60% LAD lesion. She underwent FFR for this which was negative with an FFR of 0.99. She had had a stress test at that time which indicated no evidence of reversible ischemia., but was having persistent chest pain.  It is been felt that her chest pain was due to small vessel disease in the setting of significant apical variant hypertrophic cardiomyopathy. This has been treated most effectively with ranolazine, and nitrates to some extent. Although her symptoms  now have escalated. This is concerning to me for possible worsening of her LAD disease.  Lauren House underwent a nuclear stress test on 02/03/2013. This was negative for ischemia with an EF of 52%. Since then she has reported some improvement in her symptoms. Her main complaints now have to do with left hip pain.  She is still contemplating surgery. She reported that her husband was recently diagnosed with esophageal cancer and it seems to be quite advanced. There did not appear to be any surgical options. This has caused her significant anxiety but she reports no chest pain.  Lauren House returns today for follow-up. Unfortunately she mentioned that her husband died about 3 months ago. He was diagnosed with esophageal cancer which was aggressive and inoperable. She is still grieving which is to be expected. She continues to have problems with her hip and at this point apparently will need hip surgery, but probably not hip replacement. That is scheduled about one month from now. She does have a history of moderate LAD stenosis in 2011 with a negative stress test one year ago. Recently, she has had worsening shortness of breath and chest pressure with exertion. She initially felt this was due to stress however her symptoms have been somewhat progressive and concern me for ischemia. Her EKG shows stable T-wave inversions consistent with her hypertrophic cardiomyopathy.  I saw Lauren House back in the office today. Overall she feels like she is doing well. She did have some mild chest discomfort last week and took a nitroglycerin. This is when she was in [place] with her  son and daughter-in-law and they were walking all over town. She managed to walk for several hours without any significant shortness of breath but did have some mild chest discomfort which was relieved fairly quickly with nitroglycerin. Overall she is doing well. She had her hip replacement on the left side which she is recovering from. She still has  discomfort when sitting in a car for long periods of time. She tells me that she's given a be going up to [place] fairly shortly on a vacation and that they will be going up to [facility] for the first time.'  Lauren House returns today for follow-up. She tells me on Saturday night she was awakened early in the morning with palpitations and a warm feeling as well as heaviness in her chest. She says it was quite intense. She denies any vivid dreams prior to that. She's not had any further episodes of chest pain since that or recurrent palpitations. She took a nitroglycerin and the symptoms resolved fairly promptly. She was able to go back to bed. Her EKGs is unchanged showing the T-wave inversions typical of apical variant hypertrophic cardiomyopathy. Her last stress test was in 2014 it was negative for ischemia. She does have a known 50-60% LAD stenosis which was negative by FFR.   PMHx:  Past Medical History  Diagnosis Date  . Essential hypertension, benign   . Osteoporosis   . Arthritis   . Hyperlipidemia     Diet controlled.cannot take meds  . Apical variant hypertrophic cardiomyopathy ([place])   . Anxiety   . History of kidney stones   . Vertigo   . History of skin cancer     Basal cell - abdomen  . Nocturia   . Borderline diabetes mellitus   . Coronary atherosclerosis of native coronary artery     Nonobstructive - 50-60% LAD (normal FFR) at catheterization December 2011  . Shortness of breath dyspnea     WITH EXERTION  . Bursitis of left hip   . Continuous leakage of urine     WEARS PAD  . Bruises easily     Past Surgical History  Procedure Laterality Date  . Abdominal hysterectomy    . Hemorrhoid surgery    . Eye surgery      cataracts bilateral  . Inguinal hernia repair  10/11/2011    Procedure: HERNIA REPAIR INGUINAL ADULT;  Surgeon: Stark Klein, MD;  Location: [facility];  Service: General;  Laterality: Right;  . Open surgical repair of gluteal tendon Left 03/12/2014     Procedure: LEFT HIP BURSECTOMY AND GLUTEAL TENDON REPAIR;  Surgeon: [name], MD;  [facility];  Service: Orthopedics;  Laterality: Left;    FAMHx:  Family History  Problem Relation Age of Onset  . Heart disease Father   . Lung cancer Sister   . Lung cancer Brother     SOCHx:   reports that she has never smoked. She has never used smokeless tobacco. She reports that she does not drink alcohol or use illicit drugs.  ALLERGIES:  Allergies  Allergen Reactions  . Chlorhexidine Itching  . Vancomycin Hives  . Aspirin     REACTION: nose bleeds with full strength  . Atorvastatin     REACTION: muscle pain  . Bactrim [Sulfamethoxazole-Trimethoprim]     Doesn't remember   . Cephalosporins   . Ciprofloxacin     REACTION: nausea  . Clarithromycin     Doesn't remember   . Colestipol Hcl  REACTION: hallucinations  . Ezetimibe     REACTION: GI upset  . Penicillins     REACTION: rash  . Simvastatin     REACTION: muscle spasms  . Tape Rash    ROS: A comprehensive review of systems was negative except for: Respiratory: positive for dyspnea on exertion Cardiovascular: positive for exertional chest pressure/discomfort Musculoskeletal: positive for Left hip pain  HOME MEDS: Current Outpatient Prescriptions  Medication Sig Dispense Refill  . aspirin EC 81 MG tablet Take 81 mg by mouth daily.    . Cyanocobalamin (VITAMIN B12 PO) Take 1 tablet by mouth daily.    . Diphenhyd-Hydrocort-Nystatin (FIRST-DUKES MOUTHWASH) SUSP Swish and gargle 1 teaspoon four times a day as needed for 1 week 1 Bottle 0  . doxycycline (VIBRA-TABS) 100 MG tablet Take 1 tablet (100 mg total) by mouth 2 (two) times daily. 14 tablet 0  . fluticasone (FLONASE) 50 MCG/ACT nasal spray USE TWO SPRAY IN EACH NOSTRIL EVERY DAY 50 g 1  . metoprolol tartrate (LOPRESSOR) 25 MG tablet Take 0.5 tablets (12.5 mg total) by mouth 2 (two) times daily. 30 tablet 11  . Multiple Vitamins-Minerals (OCUVITE PO) Take 1  tablet by mouth daily.    . nitroGLYCERIN (NITROSTAT) 0.4 MG SL tablet Place 1 tablet (0.4 mg total) under the tongue every 5 (five) minutes as needed for chest pain. 25 tablet 3  . Polyethyl Glycol-Propyl Glycol (SYSTANE OP) Apply 1 drop to eye 3 (three) times daily as needed (Dry eyes).    . RANEXA 500 MG 12 hr tablet TAKE ONE TABLET BY MOUTH TWICE DAILY 60 tablet 8   No current facility-administered medications for this visit.    LABS/IMAGING: No results found for this or any previous visit (from the past 48 hour(s)). No results found.  VITALS: BP 144/86 mmHg  Pulse 59  Ht 5\' 7"  (1.702 m)  Wt 147 lb (66.679 kg)  BMI 23.02 kg/m2  EXAM: GEN: Awake, in no apparent distress HEENT: PERRLA, EOMI Lungs: Clear Cardiovascular: Regular rate and rhythm normal S1-S2 Abdomen: Soft, nontender positive bowel sounds Extremities: No edema Neurologic: No focal deficits Psychiatric: Appropriately grieving  EKG: Normal sinus rhythm at 89, LVH, anterolateral T-wave inversions consistent with hypertrophic cardiomyopathy  ASSESSMENT: 1. Apical variant hypertrophic cardimyopathy - anterolateral TWI's - negative nuclear stress test 01-2013 2. Recurrent chest pain - known 50-60% LAD stenosis (FFR 0.99 in 2011) 3. Hypertension 4. Venous insufficiency 5. Right inguinal pain with a hernia, status post surgical repair 6. Right hip pain with degenerative hip disease - s/p surgery  PLAN: 1.   Mrs. Libby has had recurrent chest pain just 2 days ago which was significant and associated with palpitations. Is not clear whether the palpitations occurred first of the chest pain was the inciting cause. She may have just been anxious and her heart was racing related to that. Nevertheless again with known LAD stenosis and her last stress test being about 2 years ago, I would recommend a repeat [place]. She has nitroglycerin to take as needed. We discussed possibly increasing her Ranexa however she said  that the thousand milligrams twice daily dose gave her dizziness. She is also been on long-acting nitroglycerin before and had problems with headache.   Plan to see her back in a few weeks to discuss those results.   [name], MD, [facility] Attending Cardiologist [place] 02/14/2015, 10:07 AM

## 2015-02-15 ENCOUNTER — Telehealth (HOSPITAL_COMMUNITY): Payer: Self-pay

## 2015-02-15 NOTE — Telephone Encounter (Signed)
Encounter complete. 

## 2015-02-16 ENCOUNTER — Ambulatory Visit (HOSPITAL_COMMUNITY): Payer: Medicare Other | Admitting: Physical Therapy

## 2015-02-16 DIAGNOSIS — M25552 Pain in left hip: Secondary | ICD-10-CM

## 2015-02-16 DIAGNOSIS — M7062 Trochanteric bursitis, left hip: Secondary | ICD-10-CM

## 2015-02-16 DIAGNOSIS — Z789 Other specified health status: Secondary | ICD-10-CM

## 2015-02-16 DIAGNOSIS — R6889 Other general symptoms and signs: Secondary | ICD-10-CM

## 2015-02-16 DIAGNOSIS — R262 Difficulty in walking, not elsewhere classified: Secondary | ICD-10-CM

## 2015-02-16 DIAGNOSIS — R29898 Other symptoms and signs involving the musculoskeletal system: Secondary | ICD-10-CM

## 2015-02-16 NOTE — Therapy (Signed)
[place], [place], [address] Phone: [phone]   Fax:  [phone]  Physical Therapy Treatment  Patient Details  Name: Lauren House MRN: [id number] Date of Birth: [birth year] Referring Provider: Gerrit Halls  Encounter Date: 02/16/2015      PT End of Session - 02/16/15 1320    Visit Number 3   Number of Visits 10   Date for PT Re-Evaluation 03/11/15   Authorization Type BCBS Medicare   Authorization Time Period 02/08/15-04/11/15   Authorization - Visit Number 3   Authorization - Number of Visits 10   PT Start Time 1300   PT Stop Time [id number]   PT Time Calculation (min) 43 min   Activity Tolerance Patient tolerated treatment well      Past Medical History  Diagnosis Date  . Essential hypertension, benign   . Osteoporosis   . Arthritis   . Hyperlipidemia     Diet controlled.cannot take meds  . Apical variant hypertrophic cardiomyopathy ([place])   . Anxiety   . History of kidney stones   . Vertigo   . History of skin cancer     Basal cell - abdomen  . Nocturia   . Borderline diabetes mellitus   . Coronary atherosclerosis of native coronary artery     Nonobstructive - 50-60% LAD (normal FFR) at catheterization December 2011  . Shortness of breath dyspnea     WITH EXERTION  . Bursitis of left hip   . Continuous leakage of urine     WEARS PAD  . Bruises easily     Past Surgical History  Procedure Laterality Date  . Abdominal hysterectomy    . Hemorrhoid surgery    . Eye surgery      cataracts bilateral  . Inguinal hernia repair  10/11/2011    Procedure: HERNIA REPAIR INGUINAL ADULT;  Surgeon: Stark Klein, MD;  Location: [facility];  Service: General;  Laterality: Right;  . Open surgical repair of gluteal tendon Left 03/12/2014    Procedure: LEFT HIP BURSECTOMY AND GLUTEAL TENDON REPAIR;  Surgeon: [name], MD;  [facility];  Service: Orthopedics;  Laterality: Left;    There were no  vitals filed for this visit.  Visit Diagnosis:  Left hip pain  Trochanteric bursitis of left hip  Weakness of both legs  Difficulty navigating stairs  Difficulty walking      Subjective Assessment - 02/16/15 1301    Subjective Pt states she is not having much pain she has no quesions on her exercises.    Currently in Pain? Yes   Pain Score 1    Pain Location Hip   Pain Orientation Left                         [facility] Adult PT Treatment/Exercise - 02/16/15 0001    Exercises   Exercises Knee/Hip   Knee/Hip Exercises: Stretches   Active Hamstring Stretch Both;2 reps;30 seconds   ITB Stretch Left;2 reps;30 seconds   Piriformis Stretch 2 reps;30 seconds   Piriformis Stretch Limitations sitting    Knee/Hip Exercises: Standing   Heel Raises Both;10 reps   Hip Abduction 15 reps   Abduction Limitations 3#   Hip Extension 15 reps   Forward Step Up 15 reps   Rocker Board 2 minutes   Knee/Hip Exercises: Seated   Long Arc Quad Strengthening;Both;10 reps   Long Arc Quad Weight 3 lbs.   Sit  to Sand 20 reps  Rt LE in back x 10; Lt LE in back x 10   Knee/Hip Exercises: Supine   Short Arc Quad Sets --   Short Arc [employer] Limitations --   Bridges 10 reps;15 reps   Bridges Limitations on ball    Straight Leg Raises Both;10 reps   Knee/Hip Exercises: Sidelying   Hip ABduction Left;10 reps                  PT Short Term Goals - 02/08/15 1501    PT SHORT TERM GOAL #1   Title Pt will be independent with HEP.   Time 2   Period Weeks   Status New   PT SHORT TERM GOAL #2   Title Improve L hip IR, abduction, and adduction ROM by 8 degrees or more without pain to improve mobility of L hip and improve gait mechanics.    Time 2   Period Weeks   Status New   PT SHORT TERM GOAL #3   Title Improve strength of L hip to 4-/5 or greater to improve pt's ability to climb stairs.   Time 2   Period Weeks   Status New   PT SHORT TERM GOAL #4   Title Pt will  maintain SLS on LLE for 15 seconds or more to demonstrate improved balance and decrease risk for falls.   Time 2   Period Weeks   Status New           PT Long Term Goals - 02/08/15 1504    PT LONG TERM GOAL #1   Title Improve L hip ROM to be equal to R side with 0/10 pain to allow for pt to complete tasks such as donning pantyhose without pain.    Time 4   Period Weeks   Status New   PT LONG TERM GOAL #2   Title Improve strength of L hip and knee to 4+/5 or greater to allow for pt to ascend/descend 15 steps reciprocally without increased pain.   Time 4   Period Weeks   Status New   PT LONG TERM GOAL #3   Title Pt will complete five time sit to stand in 13 seconds or less to demonstrate improved functional mobility and LE strength.    Time 4   Period Weeks   Status New   PT LONG TERM GOAL #4   Title Pt will maintain SLS x 30 seconds or greater to demonstrate improved balance and decreased risk for falls.    Time 4   Period Weeks   Status New   PT LONG TERM GOAL #5   Title Pt will ambulate 1,000 feet with proper gait mechanics and <2/10 pain to return pt to community ambulation.    Time 4   Period Weeks   Status New               Plan - 02/16/15 1321    Clinical Impression Statement Pt pain is decreasing.  Added new exercises with verbal cuing for proper technique.   PT contnues to have weakened hip abductors with very tight piroformis mm   PT Next Visit Plan Begin lateral step up and SLS next treatment         Problem List Patient Active Problem List   Diagnosis Date Noted  . Heart palpitations 02/14/2015  . Hip bursitis 03/13/2014  . Trochanteric bursitis of left hip 03/12/2014  . Exertional chest pain 02/10/2014  . Preoperative  cardiovascular examination 02/10/2014  . Angina pectoris, variant ([place]) 12/24/2012  . Left hip pain 12/16/2012  . Bursitis of left hip 12/16/2012  . Osteoarthritis, hip, bilateral 12/16/2012  . Chest pain 08/15/2012  . Apical  variant hypertrophic cardiomyopathy ([place]) 08/15/2012  . Bilateral inguinal hernia, right symptomatic 09/21/2011  . COMEDO 10/18/2008  . POSTMENOPAUSAL OSTEOPOROSIS 09/01/2008  . HIP PAIN, LEFT 01/19/2008  . CARCINOMA, BASAL CELL, SKIN 10/02/2006  . VARICOSE VEIN 09/24/2006  . ACTINIC KERATOSIS 09/24/2006  . NECK PAIN 09/24/2006  . BACK PAIN 09/24/2006  . NEOP, BNG, SCALP/SKIN, NECK 04/16/2006  . FIBROIDS, UTERUS 04/16/2006  . Hyperlipidemia 04/16/2006  . MACULAR DEGENERATION 04/16/2006  . Essential hypertension 04/16/2006  . ALLERGIC RHINITIS 04/16/2006  . OVERACTIVE BLADDER 04/16/2006  . URINARY INCONTINENCE 04/16/2006  . SKIN CANCER, HX OF 04/16/2006  . VERTIGO, HX OF 04/16/2006  . RENAL CALCULUS, HX OF 04/16/2006    Rayetta Humphrey, PT CLT [phone] 02/16/2015, 1:45 PM  [place] [address] [place], [place], [address] Phone: [phone]   Fax:  [phone]  Name: Lauren House MRN: [id number] Date of Birth: 21-Aug-1935

## 2015-02-17 ENCOUNTER — Ambulatory Visit (HOSPITAL_COMMUNITY)
Admission: RE | Admit: 2015-02-17 | Discharge: 2015-02-17 | Disposition: A | Discharge status: Home / Self Care | Payer: Medicare Other | Source: Ambulatory Visit | Attending: Cardiovascular Disease | Admitting: Cardiovascular Disease

## 2015-02-17 DIAGNOSIS — R0602 Shortness of breath: Secondary | ICD-10-CM | POA: Insufficient documentation

## 2015-02-17 DIAGNOSIS — I10 Essential (primary) hypertension: Secondary | ICD-10-CM | POA: Insufficient documentation

## 2015-02-17 DIAGNOSIS — Z8249 Family history of ischemic heart disease and other diseases of the circulatory system: Secondary | ICD-10-CM | POA: Diagnosis not present

## 2015-02-17 DIAGNOSIS — R002 Palpitations: Secondary | ICD-10-CM | POA: Insufficient documentation

## 2015-02-17 DIAGNOSIS — R5383 Other fatigue: Secondary | ICD-10-CM | POA: Diagnosis not present

## 2015-02-17 DIAGNOSIS — R079 Chest pain, unspecified: Secondary | ICD-10-CM | POA: Insufficient documentation

## 2015-02-17 LAB — MYOCARDIAL PERFUSION IMAGING
CHL CUP NUCLEAR SDS: 0
CHL CUP NUCLEAR SRS: 6
CHL CUP NUCLEAR SSS: 6
LV sys vol: 43 mL
LVDIAVOL: 88 mL
NUC STRESS TID: 1.31
Peak HR: 87 {beats}/min
Rest HR: 64 {beats}/min

## 2015-02-17 MED ORDER — TECHNETIUM TC 99M SESTAMIBI GENERIC - CARDIOLITE
30.9000 | Freq: Once | INTRAVENOUS | Status: AC | PRN
  Administered 2015-02-17: 30.9 via INTRAVENOUS

## 2015-02-17 MED ORDER — AMINOPHYLLINE 25 MG/ML IV SOLN
75.0000 mg | Freq: Once | INTRAVENOUS | Status: AC
  Administered 2015-02-17: 75 mg via INTRAVENOUS

## 2015-02-17 MED ORDER — REGADENOSON 0.4 MG/5ML IV SOLN
0.4000 mg | Freq: Once | INTRAVENOUS | Status: AC
  Administered 2015-02-17: 0.4 mg via INTRAVENOUS

## 2015-02-17 MED ORDER — TECHNETIUM TC 99M SESTAMIBI GENERIC - CARDIOLITE
10.1000 | Freq: Once | INTRAVENOUS | Status: AC | PRN
  Administered 2015-02-17: 10.1 via INTRAVENOUS

## 2015-02-22 ENCOUNTER — Ambulatory Visit (HOSPITAL_COMMUNITY): Payer: Medicare Other | Admitting: Physical Therapy

## 2015-02-22 DIAGNOSIS — R29898 Other symptoms and signs involving the musculoskeletal system: Secondary | ICD-10-CM

## 2015-02-22 DIAGNOSIS — Z789 Other specified health status: Secondary | ICD-10-CM

## 2015-02-22 DIAGNOSIS — M25552 Pain in left hip: Secondary | ICD-10-CM | POA: Diagnosis not present

## 2015-02-22 DIAGNOSIS — R262 Difficulty in walking, not elsewhere classified: Secondary | ICD-10-CM

## 2015-02-22 DIAGNOSIS — M7062 Trochanteric bursitis, left hip: Secondary | ICD-10-CM

## 2015-02-22 DIAGNOSIS — R6889 Other general symptoms and signs: Secondary | ICD-10-CM

## 2015-02-22 NOTE — Therapy (Signed)
[facility] [place], [place], [address] Phone: [phone]   Fax:  [phone]  Physical Therapy Treatment  Patient Details  Name: Lauren House MRN: [id number] Date of Birth: August 06, 1935 Referring Provider: Gerrit Halls  Encounter Date: 02/22/2015      PT End of Session - 02/22/15 1345    Visit Number 4   Number of Visits 10   Date for PT Re-Evaluation 03/11/15   Authorization Type BCBS Medicare   Authorization - Visit Number 4   Authorization - Number of Visits 10   PT Start Time 1301   PT Stop Time 1346   PT Time Calculation (min) 45 min   Equipment Utilized During Treatment Gait belt      Past Medical History  Diagnosis Date  . Essential hypertension, benign   . Osteoporosis   . Arthritis   . Hyperlipidemia     Diet controlled.cannot take meds  . Apical variant hypertrophic cardiomyopathy ([place])   . Anxiety   . History of kidney stones   . Vertigo   . History of skin cancer     Basal cell - abdomen  . Nocturia   . Borderline diabetes mellitus   . Coronary atherosclerosis of native coronary artery     Nonobstructive - 50-60% LAD (normal FFR) at catheterization December 2011  . Shortness of breath dyspnea     WITH EXERTION  . Bursitis of left hip   . Continuous leakage of urine     WEARS PAD  . Bruises easily     Past Surgical History  Procedure Laterality Date  . Abdominal hysterectomy    . Hemorrhoid surgery    . Eye surgery      cataracts bilateral  . Inguinal hernia repair  10/11/2011    Procedure: HERNIA REPAIR INGUINAL ADULT;  Surgeon: Stark Klein, MD;  Location: [facility];  Service: General;  Laterality: Right;  . Open surgical repair of gluteal tendon Left 03/12/2014    Procedure: LEFT HIP BURSECTOMY AND GLUTEAL TENDON REPAIR;  Surgeon: [name], MD;  Location[facility];  Service: Orthopedics;  Laterality: Left;    There were no vitals filed for this visit.  Visit Diagnosis:  Left  hip pain  Trochanteric bursitis of left hip  Weakness of both legs  Difficulty navigating stairs  Difficulty walking      Subjective Assessment - 02/22/15 1308    Subjective Pt had a very busy day yesterday so she is not feeling as well as she was   Currently in Pain? Yes   Pain Score 3                          [facility] Adult PT Treatment/Exercise - 02/22/15 0001    Exercises   Exercises Knee/Hip   Knee/Hip Exercises: Stretches   Active Hamstring Stretch Both;3 reps;30 seconds   ITB Stretch Left;2 reps;30 seconds   Piriformis Stretch 2 reps;30 seconds   Piriformis Stretch Limitations sitting    Gastroc Stretch Both;1 rep;60 seconds   Knee/Hip Exercises: Standing   Heel Raises Both;15 reps   Hip Abduction Left;15 reps   Abduction Limitations 4#   Hip Extension Stengthening;Left;15 reps   Extension Limitations 4#   Lateral Step Up Left;10 reps;Hand Hold: 1;Step Height: 4"   Forward Step Up Left;10 reps;Hand Hold: 0;Step Height: 4"   Functional Squat 10 reps   Rocker Board 2 minutes   SLS Lt 6"  max ; Rt  7" max  5 x @   Other Standing Knee Exercises 3 -D hip excursion x 5    Knee/Hip Exercises: Seated   Long Arc Quad Strengthening;Left;10 reps   Long Arc Quad Weight 4 lbs.   Sit to Sand 20 reps  Rt LE in back x 10; Lt LE in back x 10   Knee/Hip Exercises: Supine   Bridges 10 reps;15 reps   Bridges Limitations on ball    Straight Leg Raises Both;10 reps   Knee/Hip Exercises: Sidelying   Hip ABduction Left;10 reps                PT Education - 02/22/15 1344    Education provided Yes   Education Details lateral step ups as well as SLS   Person(s) Educated Patient   Methods Explanation;Demonstration   Comprehension Verbalized understanding;Returned demonstration          PT Short Term Goals - 02/08/15 1501    PT SHORT TERM GOAL #1   Title Pt will be independent with HEP.   Time 2   Period Weeks   Status New   PT SHORT TERM GOAL #2    Title Improve L hip IR, abduction, and adduction ROM by 8 degrees or more without pain to improve mobility of L hip and improve gait mechanics.    Time 2   Period Weeks   Status New   PT SHORT TERM GOAL #3   Title Improve strength of L hip to 4-/5 or greater to improve pt's ability to climb stairs.   Time 2   Period Weeks   Status New   PT SHORT TERM GOAL #4   Title Pt will maintain SLS on LLE for 15 seconds or more to demonstrate improved balance and decrease risk for falls.   Time 2   Period Weeks   Status New           PT Long Term Goals - 02/08/15 1504    PT LONG TERM GOAL #1   Title Improve L hip ROM to be equal to R side with 0/10 pain to allow for pt to complete tasks such as donning pantyhose without pain.    Time 4   Period Weeks   Status New   PT LONG TERM GOAL #2   Title Improve strength of L hip and knee to 4+/5 or greater to allow for pt to ascend/descend 15 steps reciprocally without increased pain.   Time 4   Period Weeks   Status New   PT LONG TERM GOAL #3   Title Pt will complete five time sit to stand in 13 seconds or less to demonstrate improved functional mobility and LE strength.    Time 4   Period Weeks   Status New   PT LONG TERM GOAL #4   Title Pt will maintain SLS x 30 seconds or greater to demonstrate improved balance and decreased risk for falls.    Time 4   Period Weeks   Status New   PT LONG TERM GOAL #5   Title Pt will ambulate 1,000 feet with proper gait mechanics and <2/10 pain to return pt to community ambulation.    Time 4   Period Weeks   Status New               Plan - 02/22/15 1345    Clinical Impression Statement Pt with increased fatigue today with mm fatigue noted wtith stengtheing exerciises.  Pt with noted decreased balance; pt was encouraged to work on her balance every day.    Pt will benefit from skilled therapeutic intervention in order to improve on the following deficits Abnormal gait;Decreased activity  tolerance;Decreased balance;Decreased mobility;Decreased range of motion;Decreased strength;Difficulty walking;Pain   PT Next Visit Plan begin forward and lateal lunges next treatment.         Problem List Patient Active Problem List   Diagnosis Date Noted  . Heart palpitations 02/14/2015  . Hip bursitis 03/13/2014  . Trochanteric bursitis of left hip 03/12/2014  . Exertional chest pain 02/10/2014  . Preoperative cardiovascular examination 02/10/2014  . Angina pectoris, variant ([place]) 12/24/2012  . Left hip pain 12/16/2012  . Bursitis of left hip 12/16/2012  . Osteoarthritis, hip, bilateral 12/16/2012  . Chest pain 08/15/2012  . Apical variant hypertrophic cardiomyopathy ([place]) 08/15/2012  . Bilateral inguinal hernia, right symptomatic 09/21/2011  . COMEDO 10/18/2008  . POSTMENOPAUSAL OSTEOPOROSIS 09/01/2008  . HIP PAIN, LEFT 01/19/2008  . CARCINOMA, BASAL CELL, SKIN 10/02/2006  . VARICOSE VEIN 09/24/2006  . ACTINIC KERATOSIS 09/24/2006  . NECK PAIN 09/24/2006  . BACK PAIN 09/24/2006  . NEOP, BNG, SCALP/SKIN, NECK 04/16/2006  . FIBROIDS, UTERUS 04/16/2006  . Hyperlipidemia 04/16/2006  . MACULAR DEGENERATION 04/16/2006  . Essential hypertension 04/16/2006  . ALLERGIC RHINITIS 04/16/2006  . OVERACTIVE BLADDER 04/16/2006  . URINARY INCONTINENCE 04/16/2006  . SKIN CANCER, HX OF 04/16/2006  . VERTIGO, HX OF 04/16/2006  . RENAL CALCULUS, HX OF 04/16/2006    Rayetta Humphrey, PT CLT [id number] 02/22/2015, 1:49 PM  [facility] [address] [place], [place], [address] Phone: [phone]   Fax:  [phone]  Name: Lauren House MRN: [id number] Date of Birth: 03-30-1935

## 2015-02-24 ENCOUNTER — Ambulatory Visit (HOSPITAL_COMMUNITY): Payer: Medicare Other

## 2015-02-24 DIAGNOSIS — M25552 Pain in left hip: Secondary | ICD-10-CM

## 2015-02-24 DIAGNOSIS — R262 Difficulty in walking, not elsewhere classified: Secondary | ICD-10-CM

## 2015-02-24 DIAGNOSIS — R6889 Other general symptoms and signs: Secondary | ICD-10-CM

## 2015-02-24 DIAGNOSIS — M7062 Trochanteric bursitis, left hip: Secondary | ICD-10-CM

## 2015-02-24 DIAGNOSIS — R29898 Other symptoms and signs involving the musculoskeletal system: Secondary | ICD-10-CM

## 2015-02-24 DIAGNOSIS — Z789 Other specified health status: Secondary | ICD-10-CM

## 2015-02-24 NOTE — Therapy (Signed)
[facility] [place], [place], [address] Phone: [phone]   Fax:  [phone]  Physical Therapy Treatment  Patient Details  Name: Lauren House MRN: [id number] Date of Birth: 1935/07/09 Referring Provider: Gerrit Halls  Encounter Date: 02/24/2015      PT End of Session - 02/24/15 1031    Visit Number 5   Number of Visits 10   Date for PT Re-Evaluation 03/11/15   Authorization Type BCBS Medicare   Authorization Time Period 02/08/15-04/11/15   Authorization - Visit Number 5   Authorization - Number of Visits 10   PT Start Time [id number]   PT Stop Time 1101   PT Time Calculation (min) 43 min   Activity Tolerance Patient tolerated treatment well   Behavior During Therapy [facility] for tasks assessed/performed      Past Medical History  Diagnosis Date  . Essential hypertension, benign   . Osteoporosis   . Arthritis   . Hyperlipidemia     Diet controlled.cannot take meds  . Apical variant hypertrophic cardiomyopathy ([place])   . Anxiety   . History of kidney stones   . Vertigo   . History of skin cancer     Basal cell - abdomen  . Nocturia   . Borderline diabetes mellitus   . Coronary atherosclerosis of native coronary artery     Nonobstructive - 50-60% LAD (normal FFR) at catheterization December 2011  . Shortness of breath dyspnea     WITH EXERTION  . Bursitis of left hip   . Continuous leakage of urine     WEARS PAD  . Bruises easily     Past Surgical History  Procedure Laterality Date  . Abdominal hysterectomy    . Hemorrhoid surgery    . Eye surgery      cataracts bilateral  . Inguinal hernia repair  10/11/2011    Procedure: HERNIA REPAIR INGUINAL ADULT;  Surgeon: Stark Klein, MD;  Location: [facility];  Service: General;  Laterality: Right;  . Open surgical repair of gluteal tendon Left 03/12/2014    Procedure: LEFT HIP BURSECTOMY AND GLUTEAL TENDON REPAIR;  Surgeon: [name], MD;  [facility];   Service: Orthopedics;  Laterality: Left;    There were no vitals filed for this visit.  Visit Diagnosis:  Left hip pain  Trochanteric bursitis of left hip  Weakness of both legs  Difficulty navigating stairs  Difficulty walking      Subjective Assessment - 02/24/15 1015    Subjective Pt stated she has pain in center of lower back today, hip feels good just tight today.   Patient Stated Goals Decrease hip pain, improve walking   Currently in Pain? Yes   Pain Score 2    Pain Location Back   Pain Descriptors / Indicators Sore            [facility] PT Assessment - 02/24/15 0001    Assessment   Medical Diagnosis L hip bursitis   Referring Provider Gerrit Halls   Onset Date/Surgical Date 01/07/15   Next MD Visit January 2017   Prior Therapy yes-prior to surgery in January 2016   Precautions   Precautions None                     [facility] Adult PT Treatment/Exercise - 02/24/15 0001    Exercises   Exercises Knee/Hip   Knee/Hip Exercises: Stretches   Active Hamstring Stretch Both;3 reps;30 seconds   ITB  Stretch Left;2 reps;30 seconds  supine with rope, try standing next session and D/C if continues to cause discomfort   Gastroc Stretch 2 reps;30 seconds   Gastroc Stretch Limitations slant board   Other Knee/Hip Stretches SKTC 3x 20"   Knee/Hip Exercises: Standing   Heel Raises Both;15 reps   Heel Raises Limitations Heel and toe raises   Forward Lunges Both;10 reps   Forward Lunges Limitations 6in step   Side Lunges Both;5 reps   Side Lunges Limitations 6 in step   Hip Abduction Both;15 reps   Abduction Limitations 4#   Hip Extension Both;15 reps   Extension Limitations 4#   Lateral Step Up Left;10 reps;Hand Hold: 1;Step Height: 4"   Forward Step Up Left;10 reps;Hand Hold: 0;Step Height: 4"   Rocker Board 2 minutes   Rocker Board Limitations R/L   SLS Lt 13", Rt 13" max of 3   Other Standing Knee Exercises 3 -D hip excursion x 10   Knee/Hip Exercises:  Seated   Sit to Sand 20 reps                  PT Short Term Goals - 02/08/15 1501    PT SHORT TERM GOAL #1   Title Pt will be independent with HEP.   Time 2   Period Weeks   Status New   PT SHORT TERM GOAL #2   Title Improve L hip IR, abduction, and adduction ROM by 8 degrees or more without pain to improve mobility of L hip and improve gait mechanics.    Time 2   Period Weeks   Status New   PT SHORT TERM GOAL #3   Title Improve strength of L hip to 4-/5 or greater to improve pt's ability to climb stairs.   Time 2   Period Weeks   Status New   PT SHORT TERM GOAL #4   Title Pt will maintain SLS on LLE for 15 seconds or more to demonstrate improved balance and decrease risk for falls.   Time 2   Period Weeks   Status New           PT Long Term Goals - 02/08/15 1504    PT LONG TERM GOAL #1   Title Improve L hip ROM to be equal to R side with 0/10 pain to allow for pt to complete tasks such as donning pantyhose without pain.    Time 4   Period Weeks   Status New   PT LONG TERM GOAL #2   Title Improve strength of L hip and knee to 4+/5 or greater to allow for pt to ascend/descend 15 steps reciprocally without increased pain.   Time 4   Period Weeks   Status New   PT LONG TERM GOAL #3   Title Pt will complete five time sit to stand in 13 seconds or less to demonstrate improved functional mobility and LE strength.    Time 4   Period Weeks   Status New   PT LONG TERM GOAL #4   Title Pt will maintain SLS x 30 seconds or greater to demonstrate improved balance and decreased risk for falls.    Time 4   Period Weeks   Status New   PT LONG TERM GOAL #5   Title Pt will ambulate 1,000 feet with proper gait mechanics and <2/10 pain to return pt to community ambulation.    Time 4   Period Weeks   Status New  Plan - 02/24/15 1045    Clinical Impression Statement Session focus on functional strengthening.  Added forward and lateral lunges for  LE strengthening.  Pt able to demonstrated appropriate form with all exercises following minimal verbal and tactile cueing to reduce compensation.  Pt stated discomfort Lt hip during ITB stretch, no reports of pain at end of session for back or hip.     PT Next Visit Plan Continue with current PT POC, progress strengthening and add balance activites as able.        Problem List Patient Active Problem List   Diagnosis Date Noted  . Heart palpitations 02/14/2015  . Hip bursitis 03/13/2014  . Trochanteric bursitis of left hip 03/12/2014  . Exertional chest pain 02/10/2014  . Preoperative cardiovascular examination 02/10/2014  . Angina pectoris, variant ([place]) 12/24/2012  . Left hip pain 12/16/2012  . Bursitis of left hip 12/16/2012  . Osteoarthritis, hip, bilateral 12/16/2012  . Chest pain 08/15/2012  . Apical variant hypertrophic cardiomyopathy ([place]) 08/15/2012  . Bilateral inguinal hernia, right symptomatic 09/21/2011  . COMEDO 10/18/2008  . POSTMENOPAUSAL OSTEOPOROSIS 09/01/2008  . HIP PAIN, LEFT 01/19/2008  . CARCINOMA, BASAL CELL, SKIN 10/02/2006  . VARICOSE VEIN 09/24/2006  . ACTINIC KERATOSIS 09/24/2006  . NECK PAIN 09/24/2006  . BACK PAIN 09/24/2006  . NEOP, BNG, SCALP/SKIN, NECK 04/16/2006  . FIBROIDS, UTERUS 04/16/2006  . Hyperlipidemia 04/16/2006  . MACULAR DEGENERATION 04/16/2006  . Essential hypertension 04/16/2006  . ALLERGIC RHINITIS 04/16/2006  . OVERACTIVE BLADDER 04/16/2006  . URINARY INCONTINENCE 04/16/2006  . SKIN CANCER, HX OF 04/16/2006  . VERTIGO, HX OF 04/16/2006  . RENAL CALCULUS, HX OF 04/16/2006   Ihor Austin, LPTA; CBIS [phone]  Aldona Lento 02/24/2015, 12:17 PM  [facility] [place], [place], [address] Phone: [phone]   Fax:  [phone]  Name: SHONNON DYBDAHL MRN: [id number] Date of Birth: 1935-06-10

## 2015-02-25 ENCOUNTER — Telehealth: Payer: Self-pay | Admitting: Internal Medicine

## 2015-02-25 NOTE — Telephone Encounter (Signed)
Returning your call from yesterday. °

## 2015-02-25 NOTE — Telephone Encounter (Signed)
Results given to patient

## 2015-03-02 ENCOUNTER — Ambulatory Visit (HOSPITAL_COMMUNITY): Payer: Medicare Other | Attending: Physician Assistant

## 2015-03-02 DIAGNOSIS — R29898 Other symptoms and signs involving the musculoskeletal system: Secondary | ICD-10-CM

## 2015-03-02 DIAGNOSIS — Z658 Other specified problems related to psychosocial circumstances: Secondary | ICD-10-CM | POA: Insufficient documentation

## 2015-03-02 DIAGNOSIS — M25552 Pain in left hip: Secondary | ICD-10-CM

## 2015-03-02 DIAGNOSIS — R262 Difficulty in walking, not elsewhere classified: Secondary | ICD-10-CM | POA: Insufficient documentation

## 2015-03-02 DIAGNOSIS — Z789 Other specified health status: Secondary | ICD-10-CM

## 2015-03-02 DIAGNOSIS — M7062 Trochanteric bursitis, left hip: Secondary | ICD-10-CM | POA: Diagnosis present

## 2015-03-02 DIAGNOSIS — R6889 Other general symptoms and signs: Secondary | ICD-10-CM

## 2015-03-02 NOTE — Therapy (Addendum)
[facility] [place], [place], [address] Phone: [phone]   Fax:  [phone]  Physical Therapy Treatment  Patient Details  Name: Lauren House MRN: [id number] Date of Birth: [birth year] Referring Provider: Gerrit Halls  Encounter Date: 03/02/2015      PT End of Session - 03/02/15 0935    Visit Number 6   Number of Visits 10   Date for PT Re-Evaluation 03/11/15   Authorization Type BCBS Medicare   Authorization Time Period 02/08/15-04/11/15   Authorization - Visit Number 6   Authorization - Number of Visits 10   PT Start Time 0932   PT Stop Time 1015   PT Time Calculation (min) 43 min   Equipment Utilized During Treatment Gait belt   Activity Tolerance Patient tolerated treatment well   Behavior During Therapy [facility] for tasks assessed/performed      Past Medical History  Diagnosis Date  . Essential hypertension, benign   . Osteoporosis   . Arthritis   . Hyperlipidemia     Diet controlled.cannot take meds  . Apical variant hypertrophic cardiomyopathy ([place])   . Anxiety   . History of kidney stones   . Vertigo   . History of skin cancer     Basal cell - abdomen  . Nocturia   . Borderline diabetes mellitus   . Coronary atherosclerosis of native coronary artery     Nonobstructive - 50-60% LAD (normal FFR) at catheterization December 2011  . Shortness of breath dyspnea     WITH EXERTION  . Bursitis of left hip   . Continuous leakage of urine     WEARS PAD  . Bruises easily     Past Surgical History  Procedure Laterality Date  . Abdominal hysterectomy    . Hemorrhoid surgery    . Eye surgery      cataracts bilateral  . Inguinal hernia repair  10/11/2011    Procedure: HERNIA REPAIR INGUINAL ADULT;  Surgeon: Stark Klein, MD;  Location: [facility];  Service: General;  Laterality: Right;  . Open surgical repair of gluteal tendon Left 03/12/2014    Procedure: LEFT HIP BURSECTOMY AND GLUTEAL TENDON REPAIR;  Surgeon:  [name], MD;  [facility];  Service: Orthopedics;  Laterality: Left;    There were no vitals filed for this visit.  Visit Diagnosis:  Left hip pain  Trochanteric bursitis of left hip  Weakness of both legs  Difficulty navigating stairs  Difficulty walking      Subjective Assessment - 03/02/15 0932    Subjective Pt stated her Lt hip pain scale 1/10 shooting pain on occassion.  Pt reports she has been practicing balance at home.  Pt stated she needs to discharge this week due to financial issues.   Patient Stated Goals Decrease hip pain, improve walking   Currently in Pain? Yes   Pain Score 1    Pain Location Hip   Pain Orientation Left   Pain Descriptors / Indicators Shooting            [facility] Adult PT Treatment/Exercise - 03/02/15 0001    Knee/Hip Exercises: Standing   Heel Raises Both;15 reps   Heel Raises Limitations Heel and toe raises   Forward Lunges Both;10 reps   Forward Lunges Limitations 6in step   Side Lunges Both;10 reps   Side Lunges Limitations 6 in step   Functional Squat 15 reps   Stairs 1RT reciprocal pattern 4in height and 2RT 7in height  Rocker Board 2 minutes   Rocker Board Limitations R/L   SLS Lt 9", Rt 8"   Other Standing Knee Exercises 3 -D hip excursion x 10   Other Standing Knee Exercises tandem stance 3x 30"; Tandem gait 2RT; sidestep 2RT RTB   Knee/Hip Exercises: Seated   Sit to Sand 20 reps  each leg back 10x each             PT Short Term Goals - 02/08/15 1501    PT SHORT TERM GOAL #1   Title Pt will be independent with HEP.   Time 2   Period Weeks   Status New   PT SHORT TERM GOAL #2   Title Improve L hip IR, abduction, and adduction ROM by 8 degrees or more without pain to improve mobility of L hip and improve gait mechanics.    Time 2   Period Weeks   Status New   PT SHORT TERM GOAL #3   Title Improve strength of L hip to 4-/5 or greater to improve pt's ability to climb stairs.   Time 2   Period Weeks    Status New   PT SHORT TERM GOAL #4   Title Pt will maintain SLS on LLE for 15 seconds or more to demonstrate improved balance and decrease risk for falls.   Time 2   Period Weeks   Status New           PT Long Term Goals - 02/08/15 1504    PT LONG TERM GOAL #1   Title Improve L hip ROM to be equal to R side with 0/10 pain to allow for pt to complete tasks such as donning pantyhose without pain.    Time 4   Period Weeks   Status New   PT LONG TERM GOAL #2   Title Improve strength of L hip and knee to 4+/5 or greater to allow for pt to ascend/descend 15 steps reciprocally without increased pain.   Time 4   Period Weeks   Status New   PT LONG TERM GOAL #3   Title Pt will complete five time sit to stand in 13 seconds or less to demonstrate improved functional mobility and LE strength.    Time 4   Period Weeks   Status New   PT LONG TERM GOAL #4   Title Pt will maintain SLS x 30 seconds or greater to demonstrate improved balance and decreased risk for falls.    Time 4   Period Weeks   Status New   PT LONG TERM GOAL #5   Title Pt will ambulate 1,000 feet with proper gait mechanics and <2/10 pain to return pt to community ambulation.    Time 4   Period Weeks   Status New               Plan - 03/02/15 1104    Clinical Impression Statement Session focus on functional strengthening and balance training to reduce risk of falls.  Pt able to demonstrate appropriate form with all exercises following demonstration and verbal cueing for technique.  Added stair training for functional strenthening with ability to ascend and descend stairs reciprocal pattern with 1 HR with min verbal cueing for eccentric control descending 7in step height with minimal difficulty.  Added balance activities with min assistance required to reduce risk of falls.  Pt stated she wishes for discharge at the end of week due to financial concerns, discussed held with compliance with HEP,  pt given advanced  HEP.   PT Next Visit Plan Reassess for probable discharge due to financial issues next session.  Continue with current PT POC, progress strengthening and balance activites as able.   PT Home Exercise Plan Given for standing LE exercises and balance        Problem List Patient Active Problem List   Diagnosis Date Noted  . Heart palpitations 02/14/2015  . Hip bursitis 03/13/2014  . Trochanteric bursitis of left hip 03/12/2014  . Exertional chest pain 02/10/2014  . Preoperative cardiovascular examination 02/10/2014  . Angina pectoris, variant ([place]) 12/24/2012  . Left hip pain 12/16/2012  . Bursitis of left hip 12/16/2012  . Osteoarthritis, hip, bilateral 12/16/2012  . Chest pain 08/15/2012  . Apical variant hypertrophic cardiomyopathy ([place]) 08/15/2012  . Bilateral inguinal hernia, right symptomatic 09/21/2011  . COMEDO 10/18/2008  . POSTMENOPAUSAL OSTEOPOROSIS 09/01/2008  . HIP PAIN, LEFT 01/19/2008  . CARCINOMA, BASAL CELL, SKIN 10/02/2006  . VARICOSE VEIN 09/24/2006  . ACTINIC KERATOSIS 09/24/2006  . NECK PAIN 09/24/2006  . BACK PAIN 09/24/2006  . NEOP, BNG, SCALP/SKIN, NECK 04/16/2006  . FIBROIDS, UTERUS 04/16/2006  . Hyperlipidemia 04/16/2006  . MACULAR DEGENERATION 04/16/2006  . Essential hypertension 04/16/2006  . ALLERGIC RHINITIS 04/16/2006  . OVERACTIVE BLADDER 04/16/2006  . URINARY INCONTINENCE 04/16/2006  . SKIN CANCER, HX OF 04/16/2006  . VERTIGO, HX OF 04/16/2006  . RENAL CALCULUS, HX OF 04/16/2006   Ihor Austin, LPTA; [place]  Aldona Lento 03/02/2015, 11:20 AM  [facility] [place], [place], [address] Phone: [phone]   Fax:  [phone]  Name: TKEYAH RAZZAQ MRN: [id number] Date of Birth: 11-03-1935

## 2015-03-02 NOTE — Patient Instructions (Signed)
Heel Raises  Toe / Heel Raise (Standing)    Standing with support, raise heels, then rock back on heels and raise toes. Repeat 10-20 times.  Copyright  VHI. All rights reserved.   FUNCTIONAL MOBILITY: Squat    Stance: shoulder-width on floor. Bend hips and knees. Keep back straight. Do not allow knees to bend past toes. Squeeze glutes and quads to stand. 10-20 reps per set, 1-2 sets per day.  Copyright  VHI. All rights reserved.   ABDUCTION: Standing (Active)    Stand, feet flat. Lift right leg out to side.  Complete 1-2 sets of 10-20 repetitions.  http://gtsc.exer.us/111   Copyright  VHI. All rights reserved.   EXTENSION: Standing (Active)    Stand, both feet flat. Draw right leg behind body as far as possible.  Complete 1-2 sets of 10-20  repetitions.   http://gtsc.exer.us/77   Copyright  VHI. All rights reserved.   Single Leg Balance: Eyes Open    Stand on right leg with eyes open. Hold 30-60 seconds. 3 reps 1-2 times per day.  http://ggbe.exer.us/5   Copyright  VHI. All rights reserved.

## 2015-03-04 ENCOUNTER — Ambulatory Visit (HOSPITAL_COMMUNITY): Payer: Medicare Other | Admitting: Physical Therapy

## 2015-03-04 ENCOUNTER — Telehealth: Payer: Self-pay | Admitting: Internal Medicine

## 2015-03-04 DIAGNOSIS — R29898 Other symptoms and signs involving the musculoskeletal system: Secondary | ICD-10-CM

## 2015-03-04 DIAGNOSIS — M7062 Trochanteric bursitis, left hip: Secondary | ICD-10-CM

## 2015-03-04 DIAGNOSIS — Z789 Other specified health status: Secondary | ICD-10-CM

## 2015-03-04 DIAGNOSIS — M25552 Pain in left hip: Secondary | ICD-10-CM | POA: Diagnosis not present

## 2015-03-04 DIAGNOSIS — R6889 Other general symptoms and signs: Secondary | ICD-10-CM

## 2015-03-04 DIAGNOSIS — R262 Difficulty in walking, not elsewhere classified: Secondary | ICD-10-CM

## 2015-03-04 NOTE — Telephone Encounter (Signed)
Close encounter 

## 2015-03-04 NOTE — Patient Instructions (Signed)
Strengthening: Straight Leg Raise (Phase 1)    Tighten muscles on front of right thigh, then lift leg __15__ inches from surface, keeping knee locked.  Repeat __15__ times per set. Do ___1_ sets per session. Do __1__ sessions per day.  http://orth.exer.us/614   Copyright  VHI. All rights reserved.  Strengthening: Hip Abduction (Side-Lying)    Tighten muscles on front of left thigh, then lift leg ___15_ inches from surface, keeping knee locked.  Repeat ___15_ times per set. Do __11__ sets per session. Do ____ sessions per day.  http://orth.exer.us/622   Copyright  VHI. All rights reserved.  Hip Extension (Prone)    Lift left leg _2___ inches from floor, keeping knee locked. Repeat _15___ times per set. Do __1_ sets per session. Do _2___ sessions per day.  http://orth.exer.us/98   Copyright  VHI. All rights reserved.  Functional Quadriceps: Sit to Stand    Sit on edge of chair, feet flat on floor. Stand upright, extending knees fully. Repeat _20___ times per set. Do __1__ sets per session. Do ___2_ sessions per day.  http://orth.exer.us/734   Copyright  VHI. All rights reserved.

## 2015-03-04 NOTE — Therapy (Signed)
[facility] [address] [place], [place], [address] Phone: [phone]   Fax:  [phone]  Physical Therapy Treatment  Patient Details  Name: Lauren House MRN: [id number] Date of Birth: 19-Jan-1936 Referring Provider: Jeannett Senior   Encounter Date: 03/04/2015      PT End of Session - 03/04/15 0921    Visit Number 7   Number of Visits 10   Date for PT Re-Evaluation 03/11/15   Authorization Type BCBS Medicare   Authorization Time Period 02/08/15-04/11/15   Authorization - Visit Number 7   Authorization - Number of Visits 10   PT Start Time [phone]   PT Stop Time 0930   PT Time Calculation (min) 39 min   Activity Tolerance Patient tolerated treatment well      Past Medical History  Diagnosis Date  . Essential hypertension, benign   . Osteoporosis   . Arthritis   . Hyperlipidemia     Diet controlled.cannot take meds  . Apical variant hypertrophic cardiomyopathy (HCC)   . Anxiety   . History of kidney stones   . Vertigo   . History of skin cancer     Basal cell - abdomen  . Nocturia   . Borderline diabetes mellitus   . Coronary atherosclerosis of native coronary artery     Nonobstructive - 50-60% LAD (normal FFR) at catheterization December 2011  . Shortness of breath dyspnea     WITH EXERTION  . Bursitis of left hip   . Continuous leakage of urine     WEARS PAD  . Bruises easily     Past Surgical History  Procedure Laterality Date  . Abdominal hysterectomy    . Hemorrhoid surgery    . Eye surgery      cataracts bilateral  . Inguinal hernia repair  10/11/2011    Procedure: HERNIA REPAIR INGUINAL ADULT;  Surgeon: Almond Lint, MD;  Location: [facility] OR;  Service: General;  Laterality: Right;  . Open surgical repair of gluteal tendon Left 03/12/2014    Procedure: LEFT HIP BURSECTOMY AND GLUTEAL TENDON REPAIR;  Surgeon: Loanne Drilling, MD;  Location: [facility] ORS;  Service: Orthopedics;  Laterality: Left;    There were no vitals filed  for this visit.  Visit Diagnosis:  Left hip pain  Trochanteric bursitis of left hip  Weakness of both legs  Difficulty navigating stairs  Difficulty walking      Subjective Assessment - 03/04/15 0851    Subjective Pt states that she is unable to afford therapy any longer.  Pt states she has been working on her balance at home.    How long can you sit comfortably? Pt is able to sit for an hour and a half was an hour.      How long can you stand comfortably? an hour and a half or longer   How long can you walk comfortably? about an hour and a half   Patient Stated Goals Decrease hip pain, improve walking Pt states that she feels both of the goals are progressing,.  Pt states that she feels she is 70% better.    Currently in Pain? Yes   Pain Score 1    Pain Location Hip   Pain Orientation Left            [facility] PT Assessment - 03/04/15 0001    Assessment   Medical Diagnosis L hip bursitis   Referring Provider Jeannett Senior    Onset Date/Surgical Date 01/07/15   Next  MD Visit January 2017   Prior Therapy yes-prior to surgery in January 2016   Precautions   Precautions None   Balance Screen   Has the patient fallen in the past 6 months Yes   How many times? 1   Has the patient had a decrease in activity level because of a fear of falling?  No  activity level is back to prior fall level.   Is the patient reluctant to leave their home because of a fear of falling?  No   Observation/Other Assessments   Focus on Therapeutic Outcomes (FOTO)  62 with 38% limitation   Functional Tests   Functional tests Single leg stance   Single Leg Stance   Comments 16" on Lt was 7:    Strength   Strength Assessment Site Hip;Knee   Right Hip Flexion 4/5  4-/5   Right Hip Extension 3+/5  was 3+/5    Right Hip ABduction 5/5  was 4-/5    Left Hip Flexion 4-/5  was 3+/5   Left Hip Extension 3/5  was 3-/5   Left Hip ABduction 3+/5  was 3-/5   Right Knee Flexion 5/5  4-/5    Right Knee  Extension 5/5  was 4/5    Left Knee Flexion 5/5  was 3-/5   Left Knee Extension 5/5  was 4/5                     [facility] Adult PT Treatment/Exercise - 03/04/15 0001    Knee/Hip Exercises: Standing   Heel Raises 10 reps   Functional Squat 15 reps   SLS x 3 each max Lt 16"    Other Standing Knee Exercises tandem stance x 2; hip extension x 15 B    Knee/Hip Exercises: Seated   Sit to Sand 20 reps                PT Education - 03/04/15 0921    Education provided Yes   Education Details HEP for strengthenign hip mm   Person(s) Educated Patient   Methods Explanation;Handout;Verbal cues   Comprehension Verbalized understanding;Returned demonstration          PT Short Term Goals - 03/04/15 0935    PT SHORT TERM GOAL #1   Title Pt will be independent with HEP.   Time 2   Period Weeks   Status Achieved   PT SHORT TERM GOAL #2   Title Improve L hip IR, abduction, and adduction ROM by 8 degrees or more without pain to improve mobility of L hip and improve gait mechanics.    Time 2   Period Weeks   Status On-going   PT SHORT TERM GOAL #3   Title Improve strength of L hip to 4-/5 or greater to improve pt's ability to climb stairs.   Time 2   Period Weeks   Status On-going   PT SHORT TERM GOAL #4   Title Pt will maintain SLS on LLE for 15 seconds or more to demonstrate improved balance and decrease risk for falls.   Time 2   Period Weeks   Status Achieved           PT Long Term Goals - 03/04/15 0935    PT LONG TERM GOAL #1   Title Improve L hip ROM to be equal to R side with 0/10 pain to allow for pt to complete tasks such as donning pantyhose without pain.    Time 4  Period Weeks   Status On-going   PT LONG TERM GOAL #2   Title Improve strength of L hip and knee to 4+/5 or greater to allow for pt to ascend/descend 15 steps reciprocally without increased pain.   Time 4   Status Partially Met   PT LONG TERM GOAL #3   Title Pt will complete five  time sit to stand in 13 seconds or less to demonstrate improved functional mobility and LE strength.    Time 4   Period Weeks   Status Achieved   PT LONG TERM GOAL #4   Title Pt will maintain SLS x 30 seconds or greater to demonstrate improved balance and decreased risk for falls.    Time 4   Period Weeks   Status Not Met   PT LONG TERM GOAL #5   Title Pt will ambulate 1,000 feet with proper gait mechanics and <2/10 pain to return pt to community ambulation.    Time 4   Period Weeks   Status On-going               Plan - March 25, 2015 7591    Clinical Impression Statement Pt reassessed today with HEP given for weakened mm.  Pt unable to financially continue coming to therapy.  Pt feels she has improved 70%.  PT spoke to pt on the importance of continuing HEP as she is still weak as well as the benefit of joiining the [facility].   PT Next Visit Plan Discharge to HEP           G-Codes - 03-25-15 0954    Functional Assessment Tool Used FOTO   Functional Limitation Mobility: Walking and moving around   Mobility: Walking and Moving Around Goal Status [phone]) At least 40 percent but less than 60 percent impaired, limited or restricted   Mobility: Walking and Moving Around Discharge Status [phone]) At least 20 percent but less than 40 percent impaired, limited or restricted      Problem List Patient Active Problem List   Diagnosis Date Noted  . Heart palpitations 02/14/2015  . Hip bursitis 03/13/2014  . Trochanteric bursitis of left hip 03/12/2014  . Exertional chest pain 02/10/2014  . Preoperative cardiovascular examination 02/10/2014  . Angina pectoris, variant ([place]) 12/24/2012  . Left hip pain 12/16/2012  . Bursitis of left hip 12/16/2012  . Osteoarthritis, hip, bilateral 12/16/2012  . Chest pain 08/15/2012  . Apical variant hypertrophic cardiomyopathy ([place]) 08/15/2012  . Bilateral inguinal hernia, right symptomatic 09/21/2011  . COMEDO 10/18/2008  . POSTMENOPAUSAL  OSTEOPOROSIS 09/01/2008  . HIP PAIN, LEFT 01/19/2008  . CARCINOMA, BASAL CELL, SKIN 10/02/2006  . VARICOSE VEIN 09/24/2006  . ACTINIC KERATOSIS 09/24/2006  . NECK PAIN 09/24/2006  . BACK PAIN 09/24/2006  . NEOP, BNG, SCALP/SKIN, NECK 04/16/2006  . FIBROIDS, UTERUS 04/16/2006  . Hyperlipidemia 04/16/2006  . MACULAR DEGENERATION 04/16/2006  . Essential hypertension 04/16/2006  . ALLERGIC RHINITIS 04/16/2006  . OVERACTIVE BLADDER 04/16/2006  . URINARY INCONTINENCE 04/16/2006  . SKIN CANCER, HX OF 04/16/2006  . VERTIGO, HX OF 04/16/2006  . RENAL CALCULUS, HX OF 04/16/2006  Rayetta Humphrey, PT CLT [phone] 25-Mar-2015, 9:55 AM  [facility] [address] [place], [place], [address] Phone: [phone]   Fax:  [phone]  Name: ARY RUDNICK MRN: [id number] Date of Birth: [birth year]   PHYSICAL THERAPY DISCHARGE SUMMARY  Visits from Start of Care: 7  Current functional level related to goals / functional outcomes: See above  Remaining deficits: See above Education / Equipment: HEP  Plan: Patient agrees to discharge.  Patient goals were partially met. Patient is being discharged due to financial reasons.  ?????       Rayetta Humphrey, [place] CLT [phone]

## 2015-03-07 ENCOUNTER — Ambulatory Visit: Payer: Medicare Other | Admitting: Internal Medicine

## 2015-03-08 ENCOUNTER — Ambulatory Visit (HOSPITAL_COMMUNITY): Payer: Medicare Other

## 2015-03-10 ENCOUNTER — Encounter (HOSPITAL_COMMUNITY): Payer: Medicare Other

## 2015-03-15 ENCOUNTER — Encounter (HOSPITAL_COMMUNITY): Payer: Medicare Other | Admitting: Physical Therapy

## 2015-03-17 ENCOUNTER — Encounter (HOSPITAL_COMMUNITY): Payer: Medicare Other | Admitting: Physical Therapy

## 2015-03-29 ENCOUNTER — Other Ambulatory Visit: Payer: Self-pay | Admitting: Internal Medicine

## 2015-03-31 ENCOUNTER — Encounter: Payer: Self-pay | Admitting: Physician Assistant

## 2015-03-31 ENCOUNTER — Ambulatory Visit (INDEPENDENT_AMBULATORY_CARE_PROVIDER_SITE_OTHER): Payer: Medicare Other | Admitting: Physician Assistant

## 2015-03-31 VITALS — BP 148/94 | HR 64 | Temp 99.0°F | Resp 18 | Wt 149.0 lb

## 2015-03-31 DIAGNOSIS — I8390 Asymptomatic varicose veins of unspecified lower extremity: Secondary | ICD-10-CM

## 2015-03-31 DIAGNOSIS — I868 Varicose veins of other specified sites: Secondary | ICD-10-CM | POA: Diagnosis not present

## 2015-03-31 NOTE — Progress Notes (Signed)
Patient ID: Lauren House MRN: [id number], DOB: 04-Sep-1935, 80 y.o. Date of Encounter: 03/31/2015, 10:36 AM    Chief Complaint:  Chief Complaint  Patient presents with  . painful spot on back of LLE     HPI: 80 y.o. year old white female presents with above.  Says that she noticed this about 2 days ago. Says that she has had no known trauma or injury to the area. Says that she is feeling some throbbing discomfort in this one area on the back of her left calf. Says that she noticed that it looks a little bruised.  Has had no other symptoms.  No fevers or chills.  Says that she has gone to a vein specialist in the past but they did no treatments because her insurance would not cover it.     Home Meds:   Outpatient Prescriptions Prior to Visit  Medication Sig Dispense Refill  . aspirin EC 81 MG tablet Take 81 mg by mouth daily.    . Cyanocobalamin (VITAMIN B12 PO) Take 1 tablet by mouth daily.    . fluticasone (FLONASE) 50 MCG/ACT nasal spray USE TWO SPRAY IN EACH NOSTRIL EVERY DAY 50 g 1  . metoprolol tartrate (LOPRESSOR) 25 MG tablet TAKE ONE-HALF TABLET BY MOUTH TWICE DAILY 30 tablet 1  . Multiple Vitamins-Minerals (OCUVITE PO) Take 1 tablet by mouth daily.    . nitroGLYCERIN (NITROSTAT) 0.4 MG SL tablet Place 1 tablet (0.4 mg total) under the tongue every 5 (five) minutes as needed for chest pain. 25 tablet 3  . Polyethyl Glycol-Propyl Glycol (SYSTANE OP) Apply 1 drop to eye 3 (three) times daily as needed (Dry eyes).    . RANEXA 500 MG 12 hr tablet TAKE ONE TABLET BY MOUTH TWICE DAILY 60 tablet 8  . Diphenhyd-Hydrocort-Nystatin (FIRST-DUKES MOUTHWASH) SUSP Swish and gargle 1 teaspoon four times a day as needed for 1 week 1 Bottle 0  . doxycycline (VIBRA-TABS) 100 MG tablet Take 1 tablet (100 mg total) by mouth 2 (two) times daily. 14 tablet 0   No facility-administered medications prior to visit.    Allergies:  Allergies  Allergen Reactions  . Chlorhexidine  Itching  . Vancomycin Hives  . Aspirin     REACTION: nose bleeds with full strength  . Atorvastatin     REACTION: muscle pain  . Bactrim [Sulfamethoxazole-Trimethoprim]     Doesn't remember   . Cephalosporins   . Ciprofloxacin     REACTION: nausea  . Clarithromycin     Doesn't remember   . Colestipol Hcl     REACTION: hallucinations  . Ezetimibe     REACTION: GI upset  . Penicillins     REACTION: rash  . Simvastatin     REACTION: muscle spasms  . Tape Rash      Review of Systems: See HPI for pertinent ROS. All other ROS negative.    Physical Exam: Blood pressure 148/94, pulse 64, temperature 99 F (37.2 C), temperature source Oral, resp. rate 18, weight 149 lb (67.586 kg)., Body mass index is 23.33 kg/(m^2). General:  WNWD WF. Appears in no acute distress. Neck: Supple. No thyromegaly. No lymphadenopathy. Lungs: Clear bilaterally to auscultation without wheezes, rales, or rhonchi. Breathing is unlabored. Heart: Regular rhythm. No murmurs, rubs, or gallops. Msk:  Strength and tone normal for age. Extremities/Skin: Posterior Aspect of Left Calf: There is approximate 1 inch diameter area of very  light yellow ecchymosis. When I palpate this area, she reports very minimal  discomfort.  She has significant varicose veins on both of her lower legs. There is no erythema at all. There is no swelling or enlargement of the leg.  There is no warmth with touch. Neuro: Alert and oriented X 3. Moves all extremities spontaneously. Gait is normal. CNII-XII grossly in tact. Psych:  Responds to questions appropriately with a normal affect.     ASSESSMENT AND PLAN:  80 y.o. year old female with  1. Varicose veins Reassured her that I think what she is noticing is just secondary to small leakage from a varicose vein. No treatment indicated at this time. If develops additional signs or symptoms, then follow-up immediately.   [address] [place], [place], [facility] 03/31/2015 10:36  AM

## 2015-04-07 ENCOUNTER — Telehealth: Payer: Self-pay | Admitting: Internal Medicine

## 2015-04-07 NOTE — Telephone Encounter (Signed)
Returned call to patient Ranexa 500 mg samples left at [employer] office front desk.

## 2015-04-07 NOTE — Telephone Encounter (Signed)
New Message  Pt c/o medication issue:  1. Name of Medication: RANEXA 500 MG 12 hr tablet  4. What is your medication issue? Pt states that there is a form that is to be filled out to get her medications free.  She is also asking for enough samples to last her a week.

## 2015-04-12 ENCOUNTER — Telehealth: Payer: Self-pay | Admitting: *Deleted

## 2015-04-12 NOTE — Telephone Encounter (Signed)
LM that form for ranexa connect patient assistance will be placed in mail to patient today.

## 2015-04-18 ENCOUNTER — Telehealth: Payer: Self-pay | Admitting: Internal Medicine

## 2015-04-18 NOTE — Telephone Encounter (Signed)
Called patient to notify her that per Ranexa Connect application a patient & provider signature was either missing or illegible and would need to be correct in order for the app to be processed. She will notify daughter who submitted the app on her behalf and I provided Ms. Mel Almond with our office fax number so that the form can be faxed to Dr. Debara Pickett to re-sign and then sent back to daughter - versus mail or someone having to come in to the office to drop it off. She agreed with paln

## 2015-04-26 NOTE — Telephone Encounter (Signed)
LM for patient that ranexa connect info (needing signatures) will be faxed to [phone]

## 2015-04-29 ENCOUNTER — Telehealth: Payer: Self-pay | Admitting: Internal Medicine

## 2015-04-29 MED ORDER — RANOLAZINE ER 500 MG PO TB12: 500.0000 mg | ORAL_TABLET | Freq: Two times a day (BID) | ORAL | Status: DC

## 2015-04-29 NOTE — Telephone Encounter (Signed)
Faxed signed application and signed prescription to [place]

## 2015-04-29 NOTE — Telephone Encounter (Signed)
New message      Please refax signature page that Dr Debara Pickett signed for her ranexa ---patient assistance. The signature was cut off.  Also, please fax prescription.  Their fax number is [phone].

## 2015-05-04 ENCOUNTER — Telehealth: Payer: Self-pay | Admitting: Internal Medicine

## 2015-05-04 NOTE — Telephone Encounter (Signed)
Called ranexa connect - they state they did not receive the prescription and paperwork that was faxed on 04/29/15. Re-faxed the info to ranexa connect and notified patient of this.

## 2015-05-04 NOTE — Telephone Encounter (Signed)
Please refax her Ranexa application,they said the signature was cut off. They also said they need a prescription for this. Pt said if you have questions,please call her.

## 2015-05-24 ENCOUNTER — Other Ambulatory Visit: Payer: Self-pay | Admitting: Internal Medicine

## 2015-05-25 NOTE — Telephone Encounter (Signed)
Rx(s) sent to pharmacy electronically.  

## 2015-07-08 ENCOUNTER — Other Ambulatory Visit: Payer: Self-pay | Admitting: Internal Medicine

## 2015-07-08 NOTE — Telephone Encounter (Signed)
REFILL 

## 2015-07-12 ENCOUNTER — Encounter: Payer: Self-pay | Admitting: Internal Medicine

## 2015-07-12 ENCOUNTER — Ambulatory Visit (INDEPENDENT_AMBULATORY_CARE_PROVIDER_SITE_OTHER): Payer: Medicare Other | Admitting: Internal Medicine

## 2015-07-12 ENCOUNTER — Ambulatory Visit (HOSPITAL_COMMUNITY)
Admission: RE | Admit: 2015-07-12 | Discharge: 2015-07-12 | Disposition: A | Discharge status: Home / Self Care | Payer: Medicare Other | Source: Ambulatory Visit | Attending: Internal Medicine | Admitting: Internal Medicine

## 2015-07-12 VITALS — BP 164/86 | HR 64 | Ht 67.0 in | Wt 150.0 lb

## 2015-07-12 DIAGNOSIS — R5383 Other fatigue: Secondary | ICD-10-CM | POA: Diagnosis not present

## 2015-07-12 DIAGNOSIS — I517 Cardiomegaly: Secondary | ICD-10-CM | POA: Diagnosis not present

## 2015-07-12 DIAGNOSIS — R079 Chest pain, unspecified: Secondary | ICD-10-CM

## 2015-07-12 DIAGNOSIS — E785 Hyperlipidemia, unspecified: Secondary | ICD-10-CM

## 2015-07-12 DIAGNOSIS — Z01810 Encounter for preprocedural cardiovascular examination: Secondary | ICD-10-CM | POA: Insufficient documentation

## 2015-07-12 DIAGNOSIS — Z01818 Encounter for other preprocedural examination: Secondary | ICD-10-CM

## 2015-07-12 DIAGNOSIS — R0609 Other forms of dyspnea: Secondary | ICD-10-CM

## 2015-07-12 DIAGNOSIS — D689 Coagulation defect, unspecified: Secondary | ICD-10-CM | POA: Diagnosis not present

## 2015-07-12 DIAGNOSIS — R918 Other nonspecific abnormal finding of lung field: Secondary | ICD-10-CM | POA: Diagnosis not present

## 2015-07-12 DIAGNOSIS — I200 Unstable angina: Secondary | ICD-10-CM

## 2015-07-12 DIAGNOSIS — I2510 Atherosclerotic heart disease of native coronary artery without angina pectoris: Secondary | ICD-10-CM | POA: Diagnosis not present

## 2015-07-12 DIAGNOSIS — I10 Essential (primary) hypertension: Secondary | ICD-10-CM | POA: Diagnosis not present

## 2015-07-12 DIAGNOSIS — I201 Angina pectoris with documented spasm: Secondary | ICD-10-CM

## 2015-07-12 DIAGNOSIS — I422 Other hypertrophic cardiomyopathy: Secondary | ICD-10-CM | POA: Diagnosis not present

## 2015-07-12 DIAGNOSIS — J9811 Atelectasis: Secondary | ICD-10-CM | POA: Diagnosis not present

## 2015-07-12 DIAGNOSIS — R0600 Dyspnea, unspecified: Secondary | ICD-10-CM

## 2015-07-12 LAB — CBC
HEMATOCRIT: 42.9 % (ref 35.0–45.0)
HEMOGLOBIN: 14.4 g/dL (ref 11.7–15.5)
MCH: 29.4 pg (ref 27.0–33.0)
MCHC: 33.6 g/dL (ref 32.0–36.0)
MCV: 87.6 fL (ref 80.0–100.0)
MPV: 9.7 fL (ref 7.5–12.5)
Platelets: 241 10*3/uL (ref 140–400)
RBC: 4.9 MIL/uL (ref 3.80–5.10)
RDW: 13.4 % (ref 11.0–15.0)
WBC: 7.6 10*3/uL (ref 3.8–10.8)

## 2015-07-12 LAB — BASIC METABOLIC PANEL
BUN: 16 mg/dL (ref 7–25)
CHLORIDE: 100 mmol/L (ref 98–110)
CO2: 28 mmol/L (ref 20–31)
Calcium: 9.7 mg/dL (ref 8.6–10.4)
Creat: 0.96 mg/dL — ABNORMAL HIGH (ref 0.60–0.93)
GLUCOSE: 102 mg/dL — AB (ref 65–99)
POTASSIUM: 4.6 mmol/L (ref 3.5–5.3)
SODIUM: 139 mmol/L (ref 135–146)

## 2015-07-12 LAB — APTT: APTT: 30 s (ref 24–37)

## 2015-07-12 LAB — PROTIME-INR
INR: 1.04 (ref <1.50)
Prothrombin Time: 13.7 seconds (ref 11.6–15.2)

## 2015-07-12 MED ORDER — METOPROLOL TARTRATE 25 MG PO TABS: 12.5000 mg | ORAL_TABLET | Freq: Two times a day (BID) | ORAL | Status: DC

## 2015-07-12 NOTE — Patient Instructions (Addendum)
Your physician has requested that you have a cardiac catheterization @ [facility] with Dr. Debara Pickett. Cardiac catheterization is used to diagnose and/or treat various heart conditions. Doctors may recommend this procedure for a number of different reasons. The most common reason is to evaluate chest pain. Chest pain can be a symptom of coronary artery disease (CAD), and cardiac catheterization can show whether plaque is narrowing or blocking your heart's arteries. This procedure is also used to evaluate the valves, as well as measure the blood flow and oxygen levels in different parts of your heart. For further information please visit [facility]ugeFiesta[employer]tn. Please follow instruction sheet, as given.  Following your catheterization, you will not be allowed to drive for 3 days.  No lifting, pushing, or pulling greater that 10 pounds is allowed for 1 week.  You will be required to have the following tests prior to the procedure:  1. Blood work - the blood work can be done no more than 14 days prior to the procedure.  It can be done at any [facility] lab. There is a lab downstairs on the first floor of this building in [address] and one at [address]. There is also a Research scientist (life sciences) across from [facility].   2. Chest X-ray - this can be done at [facility]

## 2015-07-12 NOTE — Progress Notes (Signed)
OFFICE NOTE  Chief Complaint:  Chest pain  Primary Care Physician: Odette Fraction, MD  HPI:  Lauren House is a pleasant 80 year old female I have been following for history of apical hypertrophic cardiomyopathy for which she has done well on Ranexa 500 mg daily. Recently, she was having right inguinal pain and had a hernia for which she had repair. That, unfortunately, she says has caused her more harm than good and she continues to have more pain in her right hip area which possibly is related to the hip. She saw Dr. Wynelle Link at [facility] for further evaluation from that and has received 2 hip injections which have been beneficial. He did mention that he felt she might need a hip replacement. Recently she had a recurrent episode of chest pain. This caused her to the hospital and improved with nitroglycerin. She was ruled out for MI and discharged. Unfortunately she returned several days later after she developed swelling, warmth and pain in her left forearm. It was felt that she might have possibly had an infected IV site and clearly had superficial thrombophlebitis. She was placed on antibiotics for this and is being treated with some improvement in her symptoms. After her emergency room visit, it was recommended that she increase her Ranexa 500 mg twice daily which is the recommended starting dose. She's had no further chest pain episodes. She had prior cardiac catheterization in 2011 which demonstrated a 50-60% LAD lesion. She underwent FFR for this which was negative with an FFR of 0.99. She had had a stress test at that time which indicated no evidence of reversible ischemia., but was having persistent chest pain.  It is been felt that her chest pain was due to small vessel disease in the setting of significant apical variant hypertrophic cardiomyopathy. This has been treated most effectively with ranolazine, and nitrates to some extent. Although her symptoms now have  escalated. This is concerning to me for possible worsening of her LAD disease.  Lauren House underwent a nuclear stress test on 02/03/2013. This was negative for ischemia with an EF of 52%. Since then she has reported some improvement in her symptoms. Her main complaints now have to do with left hip pain.  She is still contemplating surgery. She reported that her husband was recently diagnosed with esophageal cancer and it seems to be quite advanced. There did not appear to be any surgical options. This has caused her significant anxiety but she reports no chest pain.  Lauren House returns today for follow-up. Unfortunately she mentioned that her husband died about 3 months ago. He was diagnosed with esophageal cancer which was aggressive and inoperable. She is still grieving which is to be expected. She continues to have problems with her hip and at this point apparently will need hip surgery, but probably not hip replacement. That is scheduled about one month from now. She does have a history of moderate LAD stenosis in 2011 with a negative stress test one year ago. Recently, she has had worsening shortness of breath and chest pressure with exertion. She initially felt this was due to stress however her symptoms have been somewhat progressive and concern me for ischemia. Her EKG shows stable T-wave inversions consistent with her hypertrophic cardiomyopathy.  I saw Lauren House back in the office today. Overall she feels like she is doing well. She did have some mild chest discomfort last week and took a nitroglycerin. This is when she was in [place] with her son and  daughter-in-law and they were walking all over town. She managed to walk for several hours without any significant shortness of breath but did have some mild chest discomfort which was relieved fairly quickly with nitroglycerin. Overall she is doing well. She had her hip replacement on the left side which she is recovering from. She still has discomfort  when sitting in a car for long periods of time. She tells me that she's given a be going up to [place] fairly shortly on a vacation and that they will be going up to [facility] for the first time.'  Lauren House returns today for follow-up. She tells me on Saturday night she was awakened early in the morning with palpitations and a warm feeling as well as heaviness in her chest. She says it was quite intense. She denies any vivid dreams prior to that. She's not had any further episodes of chest pain since that or recurrent palpitations. She took a nitroglycerin and the symptoms resolved fairly promptly. She was able to go back to bed. Her EKGs is unchanged showing the T-wave inversions typical of apical variant hypertrophic cardiomyopathy. Her last stress test was in 2014 it was negative for ischemia. She does have a known 50-60% LAD stenosis which was negative by FFR.   07/12/2015  Lauren House was seen today in the office for follow-up. Recently she went to vacation with her family and they noticed more progressive shortness of breath and chest pressure. This was keeping her from doing certain activities. She says her symptoms of gotten worse since her last office visit in December. At that time I performed a nuclear stress test which showed a low normal EF 51% but no reversible ischemia. She does have known 50-60% LAD stenosis which was negative by FFR by catheterization in 2012.  PMHx:  Past Medical History  Diagnosis Date  . Essential hypertension, benign   . Osteoporosis   . Arthritis   . Hyperlipidemia     Diet controlled.cannot take meds  . Apical variant hypertrophic cardiomyopathy ([place])   . Anxiety   . History of kidney stones   . Vertigo   . History of skin cancer     Basal cell - abdomen  . Nocturia   . Borderline diabetes mellitus   . Coronary atherosclerosis of native coronary artery     Nonobstructive - 50-60% LAD (normal FFR) at catheterization December 2011  .  Shortness of breath dyspnea     WITH EXERTION  . Bursitis of left hip   . Continuous leakage of urine     WEARS PAD  . Bruises easily     Past Surgical History  Procedure Laterality Date  . Abdominal hysterectomy    . Hemorrhoid surgery    . Eye surgery      cataracts bilateral  . Inguinal hernia repair  10/11/2011    Procedure: HERNIA REPAIR INGUINAL ADULT;  Surgeon: Stark Klein, MD;  Location: [place];  Service: General;  Laterality: Right;  . Open surgical repair of gluteal tendon Left 03/12/2014    Procedure: LEFT HIP BURSECTOMY AND GLUTEAL TENDON REPAIR;  Surgeon: [name], MD;  Location: [facility];  Service: Orthopedics;  Laterality: Left;    FAMHx:  Family History  Problem Relation Age of Onset  . Heart disease Father   . Lung cancer Sister   . Lung cancer Brother     SOCHx:   reports that she has never smoked. She has never used smokeless tobacco. She reports  that she does not drink alcohol or use illicit drugs.  ALLERGIES:  Allergies  Allergen Reactions  . Chlorhexidine Itching  . Vancomycin Hives  . Aspirin     REACTION: nose bleeds with full strength  . Atorvastatin     REACTION: muscle pain  . Bactrim [Sulfamethoxazole-Trimethoprim]     Doesn't remember   . Cephalosporins   . Ciprofloxacin     REACTION: nausea  . Clarithromycin     Doesn't remember   . Colestipol Hcl     REACTION: hallucinations  . Ezetimibe     REACTION: GI upset  . Penicillins     REACTION: rash  . Simvastatin     REACTION: muscle spasms  . Tape Rash    ROS: Pertinent items noted in HPI and remainder of comprehensive ROS otherwise negative.  HOME MEDS: Current Outpatient Prescriptions  Medication Sig Dispense Refill  . aspirin EC 81 MG tablet Take 81 mg by mouth daily.    . Cyanocobalamin (VITAMIN B12 PO) Take 1 tablet by mouth daily.    . fluticasone (FLONASE) 50 MCG/ACT nasal spray USE TWO SPRAY IN EACH NOSTRIL EVERY DAY 50 g 1  . metoprolol tartrate (LOPRESSOR) 25  MG tablet Take 0.5 tablets (12.5 mg total) by mouth 2 (two) times daily. 30 tablet 11  . Multiple Vitamins-Minerals (OCUVITE PO) Take 1 tablet by mouth daily.    . nitroGLYCERIN (NITROSTAT) 0.4 MG SL tablet Place 1 tablet (0.4 mg total) under the tongue every 5 (five) minutes as needed for chest pain. 25 tablet 3  . Polyethyl Glycol-Propyl Glycol (SYSTANE OP) Apply 1 drop to eye 3 (three) times daily as needed (Dry eyes).    . ranolazine (RANEXA) 500 MG 12 hr tablet Take 1 tablet (500 mg total) by mouth 2 (two) times daily. 180 tablet 3   No current facility-administered medications for this visit.    LABS/IMAGING: No results found for this or any previous visit (from the past 48 hour(s)). No results found.  VITALS: BP 164/86 mmHg  Pulse 64  Ht 5\' 7"  (1.702 m)  Wt 150 lb (68.04 kg)  BMI 23.49 kg/m2  EXAM: GEN: Awake, in no apparent distress HEENT: PERRLA, EOMI Lungs: Clear Cardiovascular: Regular rate and rhythm normal S1-S2 Abdomen: Soft, nontender positive bowel sounds Extremities: No edema Neurologic: No focal deficits Psychiatric: Appropriately grieving  EKG: Normal sinus rhythm at 64, LVH, anterolateral T-wave inversions consistent with hypertrophic cardiomyopathy  ASSESSMENT: 1. Unstable angina 2. Apical variant hypertrophic cardimyopathy - anterolateral TWI's - negative nuclear stress test 01-2015 3. Recurrent chest pain - known 50-60% LAD stenosis (FFR 0.99 in 2011) 4. Hypertension 5. Venous insufficiency 6. Right inguinal pain with a hernia, status post surgical repair 7. Right hip pain with degenerative hip disease - s/p surgery  PLAN: 1.   Lauren House reports progressive shortness of breath and chest discomfort with ambulation. Her symptoms have gotten worse since December. She's had negative stress test in 2014 and 2016 in December, but did have moderate mid LAD disease in 2012 by heart catheterization. It's feasible that she's developed worsening coronary  ischemia perhaps her stress test is failed to pick this up. Based on her symptom pattern, I would recommend cardiac catheterization. I did discuss risks, benefits and alternatives occur a catheterization with her leg today. She understands the procedure from her previous catheterization and is willing to proceed. Based on my schedule, are not available to do catheterization for the next month or so therefore I will schedule  her with Dr. Ellyn Hack who did her original catheterization 2012. Follow-up with me afterwards.  [name], MD, [facility] Attending Cardiologist [place] 07/12/2015, 9:28 AM

## 2015-07-13 LAB — BRAIN NATRIURETIC PEPTIDE: BRAIN NATRIURETIC PEPTIDE: 95.3 pg/mL (ref <100)

## 2015-07-13 LAB — TSH: TSH: 2.22 m[IU]/L

## 2015-07-14 NOTE — Progress Notes (Signed)
lmtcb 5/18

## 2015-07-22 ENCOUNTER — Encounter (HOSPITAL_COMMUNITY)
Admission: RE | Disposition: A | Discharge status: Home / Self Care | Payer: Self-pay | Source: Ambulatory Visit | Attending: Cardiology

## 2015-07-22 ENCOUNTER — Ambulatory Visit (HOSPITAL_COMMUNITY)
Admission: RE | Admit: 2015-07-22 | Discharge: 2015-07-23 | Disposition: A | Discharge status: Home / Self Care | Payer: Medicare Other | Source: Ambulatory Visit | Attending: Cardiology | Admitting: Cardiology

## 2015-07-22 ENCOUNTER — Encounter (HOSPITAL_COMMUNITY): Payer: Self-pay | Admitting: Cardiology

## 2015-07-22 DIAGNOSIS — I422 Other hypertrophic cardiomyopathy: Secondary | ICD-10-CM | POA: Diagnosis not present

## 2015-07-22 DIAGNOSIS — I200 Unstable angina: Secondary | ICD-10-CM | POA: Diagnosis present

## 2015-07-22 DIAGNOSIS — I10 Essential (primary) hypertension: Secondary | ICD-10-CM | POA: Diagnosis present

## 2015-07-22 DIAGNOSIS — R7303 Prediabetes: Secondary | ICD-10-CM | POA: Diagnosis not present

## 2015-07-22 DIAGNOSIS — Z880 Allergy status to penicillin: Secondary | ICD-10-CM | POA: Insufficient documentation

## 2015-07-22 DIAGNOSIS — Z85828 Personal history of other malignant neoplasm of skin: Secondary | ICD-10-CM | POA: Diagnosis not present

## 2015-07-22 DIAGNOSIS — E785 Hyperlipidemia, unspecified: Secondary | ICD-10-CM | POA: Diagnosis not present

## 2015-07-22 DIAGNOSIS — I2510 Atherosclerotic heart disease of native coronary artery without angina pectoris: Secondary | ICD-10-CM

## 2015-07-22 DIAGNOSIS — Z955 Presence of coronary angioplasty implant and graft: Secondary | ICD-10-CM

## 2015-07-22 DIAGNOSIS — I25110 Atherosclerotic heart disease of native coronary artery with unstable angina pectoris: Secondary | ICD-10-CM

## 2015-07-22 DIAGNOSIS — Z79899 Other long term (current) drug therapy: Secondary | ICD-10-CM | POA: Diagnosis not present

## 2015-07-22 DIAGNOSIS — R079 Chest pain, unspecified: Secondary | ICD-10-CM

## 2015-07-22 DIAGNOSIS — E782 Mixed hyperlipidemia: Secondary | ICD-10-CM | POA: Diagnosis present

## 2015-07-22 DIAGNOSIS — Z9861 Coronary angioplasty status: Secondary | ICD-10-CM

## 2015-07-22 DIAGNOSIS — R0600 Dyspnea, unspecified: Secondary | ICD-10-CM

## 2015-07-22 DIAGNOSIS — Z7902 Long term (current) use of antithrombotics/antiplatelets: Secondary | ICD-10-CM | POA: Insufficient documentation

## 2015-07-22 DIAGNOSIS — R0609 Other forms of dyspnea: Secondary | ICD-10-CM | POA: Insufficient documentation

## 2015-07-22 DIAGNOSIS — I25118 Atherosclerotic heart disease of native coronary artery with other forms of angina pectoris: Secondary | ICD-10-CM | POA: Insufficient documentation

## 2015-07-22 DIAGNOSIS — Z7982 Long term (current) use of aspirin: Secondary | ICD-10-CM | POA: Diagnosis not present

## 2015-07-22 HISTORY — DX: Overactive bladder: N32.81

## 2015-07-22 HISTORY — PX: CARDIAC CATHETERIZATION: SHX172

## 2015-07-22 HISTORY — PX: CORONARY STENT PLACEMENT: SHX1402

## 2015-07-22 LAB — POCT ACTIVATED CLOTTING TIME: Activated Clotting Time: 435 seconds

## 2015-07-22 SURGERY — LEFT HEART CATH AND CORONARY ANGIOGRAPHY

## 2015-07-22 MED ORDER — ACETAMINOPHEN 325 MG PO TABS: 650.0000 mg | ORAL_TABLET | ORAL | Status: DC | PRN

## 2015-07-22 MED ORDER — FLUTICASONE PROPIONATE 50 MCG/ACT NA SUSP: 2.0000 | Freq: Every day | NASAL | Status: DC | PRN

## 2015-07-22 MED ORDER — VERAPAMIL HCL 2.5 MG/ML IV SOLN
INTRAVENOUS | Status: AC
  Filled 2015-07-22: qty 2

## 2015-07-22 MED ORDER — NITROGLYCERIN 1 MG/10 ML FOR IR/CATH LAB
INTRA_ARTERIAL | Status: AC
  Filled 2015-07-22: qty 10

## 2015-07-22 MED ORDER — ASPIRIN 81 MG PO CHEW
CHEWABLE_TABLET | ORAL | Status: AC
  Filled 2015-07-22: qty 1

## 2015-07-22 MED ORDER — IOPAMIDOL (ISOVUE-370) INJECTION 76%
INTRAVENOUS | Status: DC | PRN
  Administered 2015-07-22: 195 mL via INTRA_ARTERIAL

## 2015-07-22 MED ORDER — ONDANSETRON HCL 4 MG/2ML IJ SOLN: 4.0000 mg | Freq: Four times a day (QID) | INTRAMUSCULAR | Status: DC | PRN

## 2015-07-22 MED ORDER — NITROGLYCERIN 1 MG/10 ML FOR IR/CATH LAB
INTRA_ARTERIAL | Status: DC | PRN
  Administered 2015-07-22 (×3): 200 ug via INTRACORONARY

## 2015-07-22 MED ORDER — LIDOCAINE HCL (PF) 1 % IJ SOLN
INTRAMUSCULAR | Status: AC
Start: 2015-07-22 — End: 2015-07-22
  Filled 2015-07-22: qty 30

## 2015-07-22 MED ORDER — LIDOCAINE HCL (PF) 1 % IJ SOLN
INTRAMUSCULAR | Status: DC | PRN
  Administered 2015-07-22: 3 mL via INTRADERMAL

## 2015-07-22 MED ORDER — MIDAZOLAM HCL 2 MG/2ML IJ SOLN
INTRAMUSCULAR | Status: AC
  Filled 2015-07-22: qty 2

## 2015-07-22 MED ORDER — IOPAMIDOL (ISOVUE-370) INJECTION 76%
INTRAVENOUS | Status: AC
  Filled 2015-07-22: qty 100

## 2015-07-22 MED ORDER — SODIUM CHLORIDE 0.9% FLUSH
3.0000 mL | Freq: Two times a day (BID) | INTRAVENOUS | Status: DC
  Administered 2015-07-22: 21:00:00 3 mL via INTRAVENOUS

## 2015-07-22 MED ORDER — ANGIOPLASTY BOOK
Freq: Once | Status: AC
  Administered 2015-07-22: 22:00:00
  Filled 2015-07-22: qty 1

## 2015-07-22 MED ORDER — HEPARIN SODIUM (PORCINE) 1000 UNIT/ML IJ SOLN
INTRAMUSCULAR | Status: AC
  Filled 2015-07-22: qty 1

## 2015-07-22 MED ORDER — FAMOTIDINE IN NACL 20-0.9 MG/50ML-% IV SOLN
INTRAVENOUS | Status: DC | PRN
  Administered 2015-07-22: 20 mg via INTRAVENOUS

## 2015-07-22 MED ORDER — HEPARIN (PORCINE) IN NACL 2-0.9 UNIT/ML-% IJ SOLN
INTRAMUSCULAR | Status: DC | PRN
  Administered 2015-07-22: 1500 mL

## 2015-07-22 MED ORDER — SODIUM CHLORIDE 0.9 % IV SOLN: 250.0000 mL | INTRAVENOUS | Status: DC | PRN

## 2015-07-22 MED ORDER — SODIUM CHLORIDE 0.9% FLUSH: 3.0000 mL | INTRAVENOUS | Status: DC | PRN

## 2015-07-22 MED ORDER — CLOPIDOGREL BISULFATE 300 MG PO TABS
ORAL_TABLET | ORAL | Status: DC | PRN
  Administered 2015-07-22: 600 mg via ORAL

## 2015-07-22 MED ORDER — SODIUM CHLORIDE 0.9 % WEIGHT BASED INFUSION: 3.0000 mL/kg/h | INTRAVENOUS | Status: AC

## 2015-07-22 MED ORDER — HEPARIN (PORCINE) IN NACL 2-0.9 UNIT/ML-% IJ SOLN
INTRAMUSCULAR | Status: DC | PRN
  Administered 2015-07-22: 10 mL via INTRA_ARTERIAL

## 2015-07-22 MED ORDER — FENTANYL CITRATE (PF) 100 MCG/2ML IJ SOLN
INTRAMUSCULAR | Status: DC | PRN
  Administered 2015-07-22: 25 ug via INTRAVENOUS

## 2015-07-22 MED ORDER — BIVALIRUDIN BOLUS VIA INFUSION - CUPID
INTRAVENOUS | Status: DC | PRN
  Administered 2015-07-22: 51 mg via INTRAVENOUS

## 2015-07-22 MED ORDER — RANOLAZINE ER 500 MG PO TB12
500.0000 mg | ORAL_TABLET | Freq: Two times a day (BID) | ORAL | Status: DC
  Filled 2015-07-22: qty 1

## 2015-07-22 MED ORDER — SODIUM CHLORIDE 0.9 % IV SOLN
1.7500 mg/kg/h | INTRAVENOUS | Status: AC
  Filled 2015-07-22: qty 250

## 2015-07-22 MED ORDER — FENTANYL CITRATE (PF) 100 MCG/2ML IJ SOLN
INTRAMUSCULAR | Status: AC
  Filled 2015-07-22: qty 2

## 2015-07-22 MED ORDER — CLOPIDOGREL BISULFATE 300 MG PO TABS
ORAL_TABLET | ORAL | Status: AC
  Filled 2015-07-22: qty 1

## 2015-07-22 MED ORDER — FAMOTIDINE IN NACL 20-0.9 MG/50ML-% IV SOLN
INTRAVENOUS | Status: AC
  Filled 2015-07-22: qty 50

## 2015-07-22 MED ORDER — MORPHINE SULFATE (PF) 2 MG/ML IV SOLN: 2.0000 mg | INTRAVENOUS | Status: DC | PRN

## 2015-07-22 MED ORDER — NITROGLYCERIN 0.4 MG SL SUBL: 0.4000 mg | SUBLINGUAL_TABLET | SUBLINGUAL | Status: DC | PRN

## 2015-07-22 MED ORDER — METOPROLOL TARTRATE 12.5 MG HALF TABLET
12.5000 mg | ORAL_TABLET | Freq: Two times a day (BID) | ORAL | Status: DC
  Filled 2015-07-22: qty 1

## 2015-07-22 MED ORDER — ASPIRIN 81 MG PO CHEW
81.0000 mg | CHEWABLE_TABLET | ORAL | Status: AC
  Administered 2015-07-22: 81 mg via ORAL

## 2015-07-22 MED ORDER — SODIUM CHLORIDE 0.9 % IV SOLN
250.0000 mg | INTRAVENOUS | Status: DC | PRN
  Administered 2015-07-22: 1.75 mg/kg/h via INTRAVENOUS

## 2015-07-22 MED ORDER — HEPARIN (PORCINE) IN NACL 2-0.9 UNIT/ML-% IJ SOLN
INTRAMUSCULAR | Status: AC
  Filled 2015-07-22: qty 500

## 2015-07-22 MED ORDER — BIVALIRUDIN 250 MG IV SOLR
INTRAVENOUS | Status: AC
  Filled 2015-07-22: qty 250

## 2015-07-22 MED ORDER — MIDAZOLAM HCL 2 MG/2ML IJ SOLN
INTRAMUSCULAR | Status: DC | PRN
  Administered 2015-07-22: 1 mg via INTRAVENOUS

## 2015-07-22 MED ORDER — CLOPIDOGREL BISULFATE 75 MG PO TABS
75.0000 mg | ORAL_TABLET | Freq: Every day | ORAL | Status: DC
  Administered 2015-07-23: 09:00:00 75 mg via ORAL
  Filled 2015-07-22: qty 1

## 2015-07-22 MED ORDER — SODIUM CHLORIDE 0.9 % IV SOLN
INTRAVENOUS | Status: DC
  Administered 2015-07-22: 06:00:00 via INTRAVENOUS

## 2015-07-22 MED ORDER — HEPARIN (PORCINE) IN NACL 2-0.9 UNIT/ML-% IJ SOLN
INTRAMUSCULAR | Status: AC
  Filled 2015-07-22: qty 1000

## 2015-07-22 MED ORDER — ASPIRIN EC 81 MG PO TBEC
81.0000 mg | DELAYED_RELEASE_TABLET | Freq: Every day | ORAL | Status: DC
  Filled 2015-07-22: qty 1

## 2015-07-22 SURGICAL SUPPLY — 20 items
BALLN EUPHORA RX 1.5X12 (BALLOONS) ×2
BALLN EUPHORA RX 2.0X12 (BALLOONS) ×2
BALLOON EUPHORA RX 1.5X12 (BALLOONS) ×1 IMPLANT
BALLOON EUPHORA RX 2.0X12 (BALLOONS) ×1 IMPLANT
CATH INFINITI 5 FR JL3.5 (CATHETERS) ×2 IMPLANT
CATH INFINITI 5FR ANG PIGTAIL (CATHETERS) ×2 IMPLANT
CATH OPTITORQUE TIG 4.0 5F (CATHETERS) ×2 IMPLANT
CATH VISTA GUIDE 6FR XBLAD3.5 (CATHETERS) ×2 IMPLANT
DEVICE RAD COMP TR BAND LRG (VASCULAR PRODUCTS) ×2 IMPLANT
GLIDESHEATH SLEND A-KIT 6F 22G (SHEATH) ×2 IMPLANT
KIT ENCORE 26 ADVANTAGE (KITS) ×2 IMPLANT
KIT HEART LEFT (KITS) ×2 IMPLANT
PACK CARDIAC CATHETERIZATION (CUSTOM PROCEDURE TRAY) ×2 IMPLANT
STENT PROMUS PREM MR 2.25X16 (Permanent Stent) ×2 IMPLANT
SYR MEDRAD MARK V 150ML (SYRINGE) ×2 IMPLANT
TRANSDUCER W/STOPCOCK (MISCELLANEOUS) ×2 IMPLANT
TUBING CIL FLEX 10 FLL-RA (TUBING) ×2 IMPLANT
WIRE HI TORQ VERSACORE-J 145CM (WIRE) ×2 IMPLANT
WIRE RUNTHROUGH .014X180CM (WIRE) ×2 IMPLANT
WIRE SAFE-T 1.5MM-J .035X260CM (WIRE) ×2 IMPLANT

## 2015-07-22 NOTE — H&P (View-Only) (Signed)
OFFICE NOTE  Chief Complaint:  Chest pain  Primary Care Physician: Odette Fraction, MD  HPI:  Lauren House is a pleasant 80 year old female I have been following for history of apical hypertrophic cardiomyopathy for which she has done well on Ranexa 500 mg daily. Recently, she was having right inguinal pain and had a hernia for which she had repair. That, unfortunately, she says has caused her more harm than good and she continues to have more pain in her right hip area which possibly is related to the hip. She saw Dr. Wynelle Link at [facility] for further evaluation from that and has received 2 hip injections which have been beneficial. He did mention that he felt she might need a hip replacement. Recently she had a recurrent episode of chest pain. This caused her to the hospital and improved with nitroglycerin. She was ruled out for MI and discharged. Unfortunately she returned several days later after she developed swelling, warmth and pain in her left forearm. It was felt that she might have possibly had an infected IV site and clearly had superficial thrombophlebitis. She was placed on antibiotics for this and is being treated with some improvement in her symptoms. After her emergency room visit, it was recommended that she increase her Ranexa 500 mg twice daily which is the recommended starting dose. She's had no further chest pain episodes. She had prior cardiac catheterization in 2011 which demonstrated a 50-60% LAD lesion. She underwent FFR for this which was negative with an FFR of 0.99. She had had a stress test at that time which indicated no evidence of reversible ischemia., but was having persistent chest pain.  It is been felt that her chest pain was due to small vessel disease in the setting of significant apical variant hypertrophic cardiomyopathy. This has been treated most effectively with ranolazine, and nitrates to some extent. Although her symptoms now have  escalated. This is concerning to me for possible worsening of her LAD disease.  Lauren House underwent a nuclear stress test on 02/03/2013. This was negative for ischemia with an EF of 52%. Since then she has reported some improvement in her symptoms. Her main complaints now have to do with left hip pain.  She is still contemplating surgery. She reported that her husband was recently diagnosed with esophageal cancer and it seems to be quite advanced. There did not appear to be any surgical options. This has caused her significant anxiety but she reports no chest pain.  Lauren House returns today for follow-up. Unfortunately she mentioned that her husband died about 3 months ago. He was diagnosed with esophageal cancer which was aggressive and inoperable. She is still grieving which is to be expected. She continues to have problems with her hip and at this point apparently will need hip surgery, but probably not hip replacement. That is scheduled about one month from now. She does have a history of moderate LAD stenosis in 2011 with a negative stress test one year ago. Recently, she has had worsening shortness of breath and chest pressure with exertion. She initially felt this was due to stress however her symptoms have been somewhat progressive and concern me for ischemia. Her EKG shows stable T-wave inversions consistent with her hypertrophic cardiomyopathy.  I saw Lauren House back in the office today. Overall she feels like she is doing well. She did have some mild chest discomfort last week and took a nitroglycerin. This is when she was in [place] with her son and  daughter-in-law and they were walking all over town. She managed to walk for several hours without any significant shortness of breath but did have some mild chest discomfort which was relieved fairly quickly with nitroglycerin. Overall she is doing well. She had her hip replacement on the left side which she is recovering from. She still has discomfort  when sitting in a car for long periods of time. She tells me that she's given a be going up to [place] fairly shortly on a vacation and that they will be going up to [facility] for the first time.'  Lauren House returns today for follow-up. She tells me on Saturday night she was awakened early in the morning with palpitations and a warm feeling as well as heaviness in her chest. She says it was quite intense. She denies any vivid dreams prior to that. She's not had any further episodes of chest pain since that or recurrent palpitations. She took a nitroglycerin and the symptoms resolved fairly promptly. She was able to go back to bed. Her EKGs is unchanged showing the T-wave inversions typical of apical variant hypertrophic cardiomyopathy. Her last stress test was in 2014 it was negative for ischemia. She does have a known 50-60% LAD stenosis which was negative by FFR.   07/12/2015  Lauren House was seen today in the office for follow-up. Recently she went to vacation with her family and they noticed more progressive shortness of breath and chest pressure. This was keeping her from doing certain activities. She says her symptoms of gotten worse since her last office visit in December. At that time I performed a nuclear stress test which showed a low normal EF 51% but no reversible ischemia. She does have known 50-60% LAD stenosis which was negative by FFR by catheterization in 2012.  PMHx:  Past Medical History  Diagnosis Date  . Essential hypertension, benign   . Osteoporosis   . Arthritis   . Hyperlipidemia     Diet controlled.cannot take meds  . Apical variant hypertrophic cardiomyopathy ([place])   . Anxiety   . History of kidney stones   . Vertigo   . History of skin cancer     Basal cell - abdomen  . Nocturia   . Borderline diabetes mellitus   . Coronary atherosclerosis of native coronary artery     Nonobstructive - 50-60% LAD (normal FFR) at catheterization December 2011  .  Shortness of breath dyspnea     WITH EXERTION  . Bursitis of left hip   . Continuous leakage of urine     WEARS PAD  . Bruises easily     Past Surgical History  Procedure Laterality Date  . Abdominal hysterectomy    . Hemorrhoid surgery    . Eye surgery      cataracts bilateral  . Inguinal hernia repair  10/11/2011    Procedure: HERNIA REPAIR INGUINAL ADULT;  Surgeon: Stark Klein, MD;  Location: [place];  Service: General;  Laterality: Right;  . Open surgical repair of gluteal tendon Left 03/12/2014    Procedure: LEFT HIP BURSECTOMY AND GLUTEAL TENDON REPAIR;  Surgeon: [name], MD;  Location: [facility];  Service: Orthopedics;  Laterality: Left;    FAMHx:  Family History  Problem Relation Age of Onset  . Heart disease Father   . Lung cancer Sister   . Lung cancer Brother     SOCHx:   reports that she has never smoked. She has never used smokeless tobacco. She reports  that she does not drink alcohol or use illicit drugs.  ALLERGIES:  Allergies  Allergen Reactions  . Chlorhexidine Itching  . Vancomycin Hives  . Aspirin     REACTION: nose bleeds with full strength  . Atorvastatin     REACTION: muscle pain  . Bactrim [Sulfamethoxazole-Trimethoprim]     Doesn't remember   . Cephalosporins   . Ciprofloxacin     REACTION: nausea  . Clarithromycin     Doesn't remember   . Colestipol Hcl     REACTION: hallucinations  . Ezetimibe     REACTION: GI upset  . Penicillins     REACTION: rash  . Simvastatin     REACTION: muscle spasms  . Tape Rash    ROS: Pertinent items noted in HPI and remainder of comprehensive ROS otherwise negative.  HOME MEDS: Current Outpatient Prescriptions  Medication Sig Dispense Refill  . aspirin EC 81 MG tablet Take 81 mg by mouth daily.    . Cyanocobalamin (VITAMIN B12 PO) Take 1 tablet by mouth daily.    . fluticasone (FLONASE) 50 MCG/ACT nasal spray USE TWO SPRAY IN EACH NOSTRIL EVERY DAY 50 g 1  . metoprolol tartrate (LOPRESSOR) 25  MG tablet Take 0.5 tablets (12.5 mg total) by mouth 2 (two) times daily. 30 tablet 11  . Multiple Vitamins-Minerals (OCUVITE PO) Take 1 tablet by mouth daily.    . nitroGLYCERIN (NITROSTAT) 0.4 MG SL tablet Place 1 tablet (0.4 mg total) under the tongue every 5 (five) minutes as needed for chest pain. 25 tablet 3  . Polyethyl Glycol-Propyl Glycol (SYSTANE OP) Apply 1 drop to eye 3 (three) times daily as needed (Dry eyes).    . ranolazine (RANEXA) 500 MG 12 hr tablet Take 1 tablet (500 mg total) by mouth 2 (two) times daily. 180 tablet 3   No current facility-administered medications for this visit.    LABS/IMAGING: No results found for this or any previous visit (from the past 48 hour(s)). No results found.  VITALS: BP 164/86 mmHg  Pulse 64  Ht 5\' 7"  (1.702 m)  Wt 150 lb (68.04 kg)  BMI 23.49 kg/m2  EXAM: GEN: Awake, in no apparent distress HEENT: PERRLA, EOMI Lungs: Clear Cardiovascular: Regular rate and rhythm normal S1-S2 Abdomen: Soft, nontender positive bowel sounds Extremities: No edema Neurologic: No focal deficits Psychiatric: Appropriately grieving  EKG: Normal sinus rhythm at 64, LVH, anterolateral T-wave inversions consistent with hypertrophic cardiomyopathy  ASSESSMENT: 1. Unstable angina 2. Apical variant hypertrophic cardimyopathy - anterolateral TWI's - negative nuclear stress test 01-2015 3. Recurrent chest pain - known 50-60% LAD stenosis (FFR 0.99 in 2011) 4. Hypertension 5. Venous insufficiency 6. Right inguinal pain with a hernia, status post surgical repair 7. Right hip pain with degenerative hip disease - s/p surgery  PLAN: 1.   Lauren House reports progressive shortness of breath and chest discomfort with ambulation. Her symptoms have gotten worse since December. She's had negative stress test in 2014 and 2016 in December, but did have moderate mid LAD disease in 2012 by heart catheterization. It's feasible that she's developed worsening coronary  ischemia perhaps her stress test is failed to pick this up. Based on her symptom pattern, I would recommend cardiac catheterization. I did discuss risks, benefits and alternatives occur a catheterization with her leg today. She understands the procedure from her previous catheterization and is willing to proceed. Based on my schedule, are not available to do catheterization for the next month or so therefore I will schedule  her with Dr. Ellyn Hack who did her original catheterization 2012. Follow-up with me afterwards.  [name], MD, [facility] Attending Cardiologist [place] 07/12/2015, 9:28 AM

## 2015-07-22 NOTE — Progress Notes (Signed)
Up to toilet

## 2015-07-22 NOTE — Interval H&P Note (Signed)
History and Physical Interval Note:  07/22/2015 7:14 AM  [name]  has presented today for surgery, with the diagnosis of cp - concerning for unstable angina with known moderate CAD.   The various methods of treatment have been discussed with the patient and family. After consideration of risks, benefits and other options for treatment, the patient has consented to  Procedure(s): Left Heart Cath and Coronary Angiography (N/A) with Possible Percutaneous Coronary Intervention as a surgical intervention .  The patient's history has been reviewed, patient examined, no change in status, stable for surgery.  I have reviewed the patient's chart and labs.  Questions were answered to the patient's satisfaction.    Cath Lab Visit (complete for each Cath Lab visit)  Clinical Evaluation Leading to the Procedure:   ACS: No.  Non-ACS:    Anginal Classification: CCS III  Anti-ischemic medical therapy: Maximal Therapy (2 or more classes of medications)  Non-Invasive Test Results: Low-risk stress test findings: cardiac mortality <1%/year with persistent / worsening Sx.  Prior CABG: No previous CABG   Kandace Blitz, MD

## 2015-07-23 ENCOUNTER — Encounter (HOSPITAL_COMMUNITY): Payer: Self-pay | Admitting: Student

## 2015-07-23 DIAGNOSIS — Z955 Presence of coronary angioplasty implant and graft: Secondary | ICD-10-CM | POA: Diagnosis not present

## 2015-07-23 DIAGNOSIS — I10 Essential (primary) hypertension: Secondary | ICD-10-CM

## 2015-07-23 DIAGNOSIS — E785 Hyperlipidemia, unspecified: Secondary | ICD-10-CM | POA: Diagnosis not present

## 2015-07-23 DIAGNOSIS — I422 Other hypertrophic cardiomyopathy: Secondary | ICD-10-CM

## 2015-07-23 DIAGNOSIS — I25118 Atherosclerotic heart disease of native coronary artery with other forms of angina pectoris: Secondary | ICD-10-CM | POA: Diagnosis not present

## 2015-07-23 DIAGNOSIS — I200 Unstable angina: Secondary | ICD-10-CM

## 2015-07-23 LAB — CBC
HCT: 41.7 % (ref 36.0–46.0)
Hemoglobin: 13.5 g/dL (ref 12.0–15.0)
MCH: 28.4 pg (ref 26.0–34.0)
MCHC: 32.4 g/dL (ref 30.0–36.0)
MCV: 87.6 fL (ref 78.0–100.0)
PLATELETS: 211 10*3/uL (ref 150–400)
RBC: 4.76 MIL/uL (ref 3.87–5.11)
RDW: 13 % (ref 11.5–15.5)
WBC: 7.3 10*3/uL (ref 4.0–10.5)

## 2015-07-23 LAB — BASIC METABOLIC PANEL
Anion gap: 6 (ref 5–15)
BUN: 11 mg/dL (ref 6–20)
CALCIUM: 9 mg/dL (ref 8.9–10.3)
CO2: 26 mmol/L (ref 22–32)
CREATININE: 0.99 mg/dL (ref 0.44–1.00)
Chloride: 105 mmol/L (ref 101–111)
GFR calc non Af Amer: 53 mL/min — ABNORMAL LOW (ref >60)
Glucose, Bld: 101 mg/dL — ABNORMAL HIGH (ref 65–99)
Potassium: 3.8 mmol/L (ref 3.5–5.1)
Sodium: 137 mmol/L (ref 135–145)

## 2015-07-23 LAB — PLATELET INHIBITION P2Y12: PLATELET FUNCTION P2Y12: 189 [PRU] — AB (ref 194–418)

## 2015-07-23 MED ORDER — CLOPIDOGREL BISULFATE 75 MG PO TABS: 75.0000 mg | ORAL_TABLET | Freq: Every day | ORAL | Status: DC

## 2015-07-23 NOTE — Progress Notes (Signed)
Pt had 7 beats of VT on monitor.  Pt denies any pain, completely asymptomatic.  Strip saved and placed in chart.  Will continue to monitor

## 2015-07-23 NOTE — Discharge Summary (Signed)
Discharge Summary    Patient ID: Lauren House,  MRN: [id number], DOB/AGE: 1935-12-15 80 y.o.  Admit date: 07/22/2015 Discharge date: 07/23/2015  Primary Care Provider: [facility] Primary Cardiologist: Dr. Debara Pickett  Discharge Diagnoses    Principal Problem:   Unstable angina [facility]) Active Problems:   Hyperlipidemia   Essential hypertension   Apical variant hypertrophic cardiomyopathy ([place])   DOE (dyspnea on exertion)   CAD S/P percutaneous coronary angioplasty   History of Present Illness     Lauren House is a 80 yo female with past medical history of apical hypertrophic cardiomyopathy, HTN, HLD, and mild nonobstructive CAD by cath in 2011 who was recently seen in the office by Dr. Debara Pickett for worsening dyspnea on exertion and chest discomfort. Cardiac catheterization was recommended and she presented to [facility] on 07/22/2015 for the procedure.   Hospital Course     Consultants: None  Her cardiac catheterization showed 80% stenosis in the Mid LAD and this was treated with a Promus DES 2.25 mm x 16 mm (2.4 mm) with 0% residual stenosis. A stable 50% Ost LAD lesion was also noted but appeared stable. She was started on DAPT with ASA and Plavix for which she will need to remain on for 3 months and then can stop ASA. She tolerated the procedure well and no complications were noted.  The following morning, she denied any repeat episodes of chest discomfort or dyspnea. Right radial cath site was stable without evidence of ecchymosis or a hematoma. She ambulated over 600 ft with cardiac rehab without any anginal symptoms. Vitals and lab results were thoroughly reviewed and appeared stable.   She was last examined by Dr. Bronson Ing and deemed stable for discharge. She will need follow-up regarding her HLD in the setting of known CAD, for she has been intolerant to statins and Zetia in the past. A message has been sent to the office to arrange 2-week hospital follow-up. She was  discharged home in stable condition. _____________  Discharge Vitals Blood pressure 124/62, pulse 76, temperature 97.9 F (36.6 C), temperature source Oral, resp. rate 22, height 5\' 6"  (1.676 m), weight 152 lb 8.9 oz (69.2 kg), SpO2 96 %.  Filed Weights   07/22/15 1600 07/23/15 0434  Weight: 153 lb 7 oz (69.6 kg) 152 lb 8.9 oz (69.2 kg)    Labs & Radiologic Studies    CBC  Recent Labs  07/23/15 0430  WBC 7.3  HGB 13.5  HCT 41.7  MCV 87.6  PLT 123456   Basic Metabolic Panel  Recent Labs  07/23/15 0430  NA 137  K 3.8  CL 105  CO2 26  GLUCOSE 101*  BUN 11  CREATININE 0.99  CALCIUM 9.0    Diagnostic Studies/Procedures    Cardiac Catheterization: 07/22/2015  1. Mid LAD lesion, 80% stenosed. Post intervention with Promus DES 2.25 mm x 16 mm (2.4 mm), there is a 0% residual stenosis. 2. Ost LAD lesion, 50% stenosed. stable from original catheterization 3. The left ventricular systolic function is normal. with minimal apical filling consistent with known apical hypertrophy. 4. Otherwise mild to moderate disease in the circumflex and RCA system  Likely culprit for the patient's symptom was the mid LAD lesion. This is treated with a DES stent.  Plan:  Overnight monitoring post PCI.  Check P2Y12 inhibition in the morning  Continue dual therapy for minimum of 3 months, at which time can stop aspirin.  Expected discharge tomorrow   Disposition  Pt is being discharged home today in good condition.  Follow-up Plans & Appointments    Follow-up Information    Follow up with [name], MD.   Specialty:  Cardiology   Why:  The office will call you to arrange follow-up. Please call the number provided if you have not heard from them in 3 business days.   Contact information:   [address] [place] [address] [phone]      Discharge Instructions    Amb Referral to Cardiac Rehabilitation    Complete by:  As directed   Diagnosis:   Coronary Stents     Diet - low sodium heart healthy    Complete by:  As directed      Discharge instructions    Complete by:  As directed   PLEASE REMEMBER TO BRING ALL OF YOUR MEDICATIONS TO EACH OF YOUR FOLLOW-UP OFFICE VISITS.  PLEASE ATTEND ALL SCHEDULED FOLLOW-UP APPOINTMENTS.   Activity: Increase activity slowly as tolerated. You may shower, but no soaking baths (or swimming) for 1 week. No driving for 24 hours. No lifting over 5 lbs for 1 week. No sexual activity for 1 week.   You May Return to Work: in 1 week (if applicable)  Wound Care: You may wash cath site gently with soap and water. Keep cath site clean and dry. If you notice pain, swelling, bleeding or pus at your cath site, please call [phone].     Increase activity slowly    Complete by:  As directed            Discharge Medications   Current Discharge Medication List    START taking these medications   Details  clopidogrel (PLAVIX) 75 MG tablet Take 1 tablet (75 mg total) by mouth daily with breakfast. Qty: 30 tablet, Refills: 11      CONTINUE these medications which have NOT CHANGED   Details  aspirin EC 81 MG tablet Take 81 mg by mouth daily.    Cyanocobalamin (VITAMIN B12 PO) Take 1 tablet by mouth daily.    metoprolol tartrate (LOPRESSOR) 25 MG tablet Take 0.5 tablets (12.5 mg total) by mouth 2 (two) times daily. Qty: 30 tablet, Refills: 11    Multiple Vitamins-Minerals (OCUVITE PO) Take 1 tablet by mouth daily.    nitroGLYCERIN (NITROSTAT) 0.4 MG SL tablet Place 1 tablet (0.4 mg total) under the tongue every 5 (five) minutes as needed for chest pain. Qty: 25 tablet, Refills: 3    Polyethyl Glycol-Propyl Glycol (SYSTANE OP) Apply 1 drop to eye 3 (three) times daily as needed (Dry eyes).    ranolazine (RANEXA) 500 MG 12 hr tablet Take 1 tablet (500 mg total) by mouth 2 (two) times daily. Qty: 180 tablet, Refills: 3    fluticasone (FLONASE) 50 MCG/ACT nasal spray USE TWO SPRAY IN EACH NOSTRIL  EVERY DAY Qty: 50 g, Refills: 1      STOP taking these medications     ibuprofen (ADVIL,MOTRIN) 200 MG tablet            Allergies Allergies  Allergen Reactions  . Chlorhexidine Itching  . Vancomycin Hives  . Aspirin Other (See Comments)    Nose bleeds with full strength  . Atorvastatin Other (See Comments)    Muscle pain  . Bactrim [Sulfamethoxazole-Trimethoprim] Other (See Comments)    Doesn't remember   . Cephalosporins Hives and Itching  . Ciprofloxacin Nausea Only  . Clarithromycin Other (See Comments)    Doesn't remember   .  Colestipol Hcl Other (See Comments)    Hallucinations  . Ezetimibe Other (See Comments)    GI upset  . Simvastatin Other (See Comments)    Muscle spasms  . Penicillins Rash    Has patient had a PCN reaction causing immediate rash, facial/tongue/throat swelling, SOB or lightheadedness with hypotension: yes Has patient had a PCN reaction causing severe rash involving mucus membranes or skin necrosis: no Has patient had a PCN reaction that required hospitalization no Has patient had a PCN reaction occurring within the last 10 years: no If all of the above answers are "NO", then may proceed with Cephalosporin use.   . Tape Rash     Outstanding Labs/Studies   None  Duration of Discharge Encounter   Greater than 30 minutes including physician time.  Signed, Erma Heritage, PA-C 07/23/2015, 9:20 AM

## 2015-07-23 NOTE — Progress Notes (Signed)
CARDIAC REHAB PHASE I   PRE:  Rate/Rhythm: 75 SR  BP:  Supine:   Sitting: 124/62  Standing:    SaO2: 97 % RA  MODE:  Ambulation: 600 ft   POST:  Rate/Rhythm: 82 SR  BP:  Supine:   Sitting: 160/83  Standing:    SaO2: 97% RA Tolerated ambulation well without angina, education completed re: angina, NTG usage, exercise progression , site care and precautions, referred to [facility] phase ll cardiac rehab. [phone]  Liliane Channel RN, BSN 07/23/2015 8:42 AM

## 2015-07-23 NOTE — Discharge Instructions (Signed)
Plavix is a blood thinner that will increase risk of bleeding. Do not take over the counter pain medication such as Ibuprofen (Advil, Motrin) or Naprosyn (Aleve). This will further increase your bleeding risk. Use tylenol or acetaminophen for pain.

## 2015-07-23 NOTE — Progress Notes (Signed)
Hospital Problem List     Principal Problem:   Unstable angina [facility]) Active Problems:   Hyperlipidemia   Essential hypertension   Apical variant hypertrophic cardiomyopathy ([facility])   DOE (dyspnea on exertion)   CAD S/P percutaneous coronary angioplasty    Patient Profile:   Primary Cardiologist: Dr. Debara Pickett  80 yo female w/ PMH of apical hypertrophic cardiomyopathy, HTN, HLD, and mild nonobstructive CAD by cath in 2011 who was recently seen in the office by Dr. Debara Pickett for worsening dyspnea on exertion and chest discomfort. Cardiac catheterization was recommended and she presented to [facility] on 07/22/2015 for the procedure.   Subjective   Denies any chest pain or dyspnea overnight. No complications with right radial cath site.  Inpatient Medications    . aspirin EC  81 mg Oral Daily  . clopidogrel  75 mg Oral Q breakfast  . metoprolol tartrate  12.5 mg Oral BID  . ranolazine  500 mg Oral BID  . sodium chloride flush  3 mL Intravenous Q12H    Vital Signs    Filed Vitals:   07/22/15 1800 07/22/15 1927 07/22/15 1945 07/23/15 0434  BP: 137/57 135/59  138/73  Pulse: 65 60 60 69  Temp:  98 F (36.7 C)  97 F (36.1 C)  TempSrc:  Oral  Oral  Resp: 18 22 24 20   Height:      Weight:    152 lb 8.9 oz (69.2 kg)  SpO2: 98% 96% 97% 94%    Intake/Output Summary (Last 24 hours) at 07/23/15 0731 Last data filed at 07/23/15 0434  Gross per 24 hour  Intake    120 ml  Output    300 ml  Net   -180 ml   Filed Weights   07/22/15 1600 07/23/15 0434  Weight: 153 lb 7 oz (69.6 kg) 152 lb 8.9 oz (69.2 kg)    Physical Exam    General: Well developed, well nourished, female appearing in no acute distress. Head: Normocephalic, atraumatic.  Neck: Supple without bruits, JVD not elevated. Lungs:  Resp regular and unlabored, CTA without wheezing or rales. Heart: RRR, S1, S2, no S3, S4, or murmur; no rub. Abdomen: Soft, non-tender, non-distended with normoactive bowel sounds. No  hepatomegaly. No rebound/guarding. No obvious abdominal masses. Extremities: No clubbing, cyanosis, or edema. Distal pedal pulses are 2+ bilaterally. Right radial cath site without ecchymosis or evidence of a hematoma. Neuro: Alert and oriented X 3. Moves all extremities spontaneously. Psych: Normal affect.  Labs    CBC  Recent Labs  07/23/15 0430  WBC 7.3  HGB 13.5  HCT 41.7  MCV 87.6  PLT 123456   Basic Metabolic Panel  Recent Labs  07/23/15 0430  NA 137  K 3.8  CL 105  CO2 26  GLUCOSE 101*  BUN 11  CREATININE 0.99  CALCIUM 9.0    Telemetry    NSR, HR in 60's - 70's. 7 beats asymptomatic NSVT overnight.  ECG    NSR, HR 68. LVH. Anterior TWI, present on previous tracings and likely repol abnormality.    Cardiac Studies and Radiology    Dg Chest 2 View: 07/12/2015  CLINICAL DATA:  Chest tightness. EXAM: CHEST  2 VIEW COMPARISON:  01/08/2015. FINDINGS: Mediastinum and hilar structures normal. Low lung volumes with mild bibasilar atelectasis and/or scarring. No pleural effusion pneumothorax. Mild cardiomegaly with normal pulmonary vascularity. No acute bony abnormality. IMPRESSION: 1. Low lung volumes with mild bibasilar atelectasis. 2.  Mild cardiomegaly with normal  pulmonary vascularity. Electronically Signed   By: Marcello Moores  Register   On: 07/12/2015 11:26    Cardiac Catheterization: 07/22/2015  1. Mid LAD lesion, 80% stenosed. Post intervention with Promus DES 2.25 mm x 16 mm (2.4 mm), there is a 0% residual stenosis. 2. Ost LAD lesion, 50% stenosed. stable from original catheterization 3. The left ventricular systolic function is normal. with minimal apical filling consistent with known apical hypertrophy. 4. Otherwise mild to moderate disease in the circumflex and RCA system   Likely culprit for the patient's symptom was the mid LAD lesion. This is treated with a DES stent.  Plan:  Overnight monitoring post PCI.  Check P2Y12 inhibition in the  morning  Continue dual therapy for minimum of 3 months, at which time can stop aspirin.  Expected discharge tomorrow  Assessment & Plan    1. Unstable Angina/ CAD - had been seen in the office for worsening dyspnea with exertion and chest discomfort.  - cardiac catheterization showed 80% stenosis in the Mid LAD and this was treated with a Promus DES 2.25 mm x 16 mm (2.4 mm) with 0% residual stenosis. A stable 50% Ost LAD lesion was also noted but appeared stable.  - started on DAPT with ASA and Plavix for which she will need to remain on for 3 months and then can stop ASA. - continue BB.   2. HTN - BP well-controlled this AM. - continue current medication regimen.  3. HLD - Would recommend a Lipid Panel and initiation of statin therapy or alternative as an outpatient (Last LDL in the system was 166 in 11/2012).  - has been intolerant to statins and Zetia in the past.  Signed, Erma Heritage , PA-C 7:31 AM 07/23/2015 Pager: [phone]  The patient was seen and examined, and I agree with the physical exam, assessment and plan as documented above which has been discussed with B. Strader PA-C, with modifications as noted below. Pt doing well post-PCI and eager to go home. Denies chest pain/shortness of breath. If statin intolerant, may be a candidate for PCSK-9 inhibitors in outpatient setting. Continue DAPT and beta blockers. Plan for discharge today.  Kate Sable, MD, [facility]  07/23/2015 8:53 AM

## 2015-07-25 ENCOUNTER — Encounter (HOSPITAL_COMMUNITY): Payer: Self-pay | Admitting: Emergency Medicine

## 2015-07-25 ENCOUNTER — Emergency Department (HOSPITAL_COMMUNITY): Payer: Medicare Other

## 2015-07-25 ENCOUNTER — Emergency Department (HOSPITAL_BASED_OUTPATIENT_CLINIC_OR_DEPARTMENT_OTHER)
Admit: 2015-07-25 | Discharge: 2015-07-25 | Disposition: A | Discharge status: Home / Self Care | Payer: Medicare Other | Attending: Emergency Medicine | Admitting: Emergency Medicine

## 2015-07-25 ENCOUNTER — Emergency Department (HOSPITAL_COMMUNITY)
Admission: EM | Admit: 2015-07-25 | Discharge: 2015-07-25 | Disposition: A | Discharge status: Home / Self Care | Payer: Medicare Other | Attending: Emergency Medicine | Admitting: Emergency Medicine

## 2015-07-25 DIAGNOSIS — Z9861 Coronary angioplasty status: Secondary | ICD-10-CM | POA: Diagnosis not present

## 2015-07-25 DIAGNOSIS — Z79899 Other long term (current) drug therapy: Secondary | ICD-10-CM | POA: Diagnosis not present

## 2015-07-25 DIAGNOSIS — Z9889 Other specified postprocedural states: Secondary | ICD-10-CM | POA: Diagnosis not present

## 2015-07-25 DIAGNOSIS — M1990 Unspecified osteoarthritis, unspecified site: Secondary | ICD-10-CM | POA: Insufficient documentation

## 2015-07-25 DIAGNOSIS — S40022A Contusion of left upper arm, initial encounter: Secondary | ICD-10-CM | POA: Diagnosis not present

## 2015-07-25 DIAGNOSIS — R2232 Localized swelling, mass and lump, left upper limb: Secondary | ICD-10-CM | POA: Diagnosis not present

## 2015-07-25 DIAGNOSIS — M79609 Pain in unspecified limb: Secondary | ICD-10-CM

## 2015-07-25 DIAGNOSIS — Z7982 Long term (current) use of aspirin: Secondary | ICD-10-CM | POA: Diagnosis not present

## 2015-07-25 DIAGNOSIS — M7981 Nontraumatic hematoma of soft tissue: Secondary | ICD-10-CM | POA: Insufficient documentation

## 2015-07-25 DIAGNOSIS — Z7902 Long term (current) use of antithrombotics/antiplatelets: Secondary | ICD-10-CM | POA: Insufficient documentation

## 2015-07-25 DIAGNOSIS — Z8659 Personal history of other mental and behavioral disorders: Secondary | ICD-10-CM | POA: Diagnosis not present

## 2015-07-25 DIAGNOSIS — M79602 Pain in left arm: Secondary | ICD-10-CM | POA: Diagnosis present

## 2015-07-25 DIAGNOSIS — T148XXA Other injury of unspecified body region, initial encounter: Secondary | ICD-10-CM

## 2015-07-25 DIAGNOSIS — M7989 Other specified soft tissue disorders: Secondary | ICD-10-CM

## 2015-07-25 DIAGNOSIS — Z8639 Personal history of other endocrine, nutritional and metabolic disease: Secondary | ICD-10-CM | POA: Insufficient documentation

## 2015-07-25 DIAGNOSIS — R011 Cardiac murmur, unspecified: Secondary | ICD-10-CM | POA: Diagnosis not present

## 2015-07-25 DIAGNOSIS — Z85828 Personal history of other malignant neoplasm of skin: Secondary | ICD-10-CM | POA: Diagnosis not present

## 2015-07-25 DIAGNOSIS — M79622 Pain in left upper arm: Secondary | ICD-10-CM | POA: Diagnosis not present

## 2015-07-25 DIAGNOSIS — I10 Essential (primary) hypertension: Secondary | ICD-10-CM | POA: Insufficient documentation

## 2015-07-25 DIAGNOSIS — Z87448 Personal history of other diseases of urinary system: Secondary | ICD-10-CM | POA: Diagnosis not present

## 2015-07-25 DIAGNOSIS — Z87442 Personal history of urinary calculi: Secondary | ICD-10-CM | POA: Insufficient documentation

## 2015-07-25 DIAGNOSIS — I2510 Atherosclerotic heart disease of native coronary artery without angina pectoris: Secondary | ICD-10-CM | POA: Diagnosis not present

## 2015-07-25 DIAGNOSIS — Z880 Allergy status to penicillin: Secondary | ICD-10-CM | POA: Insufficient documentation

## 2015-07-25 NOTE — Progress Notes (Addendum)
VASCULAR LAB PRELIMINARY  PRELIMINARY  PRELIMINARY  PRELIMINARY  Left upper extremity venous duplex completed.    Preliminary report:  There is no DVT noted in the left upper extremity.  There is isolated superficial thrombosis noted in the basilic vein at the [facility]  In the upper arm, at site of pain, there is large area of mixed echoes, etiology unknown.    Called results to Lonia Skinner, MD.  Sharion Dove, RVT 07/25/2015, 9:48 AM

## 2015-07-25 NOTE — ED Notes (Signed)
Pt c/o lumps to left arm and pain onset prior to her having her cath done on Friday. Family reports that area "feels like is has fever in it." Area red in color, cool to touch.

## 2015-07-25 NOTE — Discharge Instructions (Signed)
He were seen and evaluated today for the swelling and pain in your arm. This appears on ultrasound to be most consistent with hematoma. This can be from a muscle strain or injury. The exact cause of this is not clear at this time. Please ice and elevate the area. Follow-up with orthopedics outpatient for further evaluation.  Hematoma A hematoma is a collection of blood under the skin, in an organ, in a body space, in a joint space, or in other tissue. The blood can clot to form a lump that you can see and feel. The lump is often firm and may sometimes become sore and tender. Most hematomas get better in a few days to weeks. However, some hematomas may be serious and require medical care. Hematomas can range in size from very small to very large. CAUSES  A hematoma can be caused by a blunt or penetrating injury. It can also be caused by spontaneous leakage from a blood vessel under the skin. Spontaneous leakage from a blood vessel is more likely to occur in older people, especially those taking blood thinners. Sometimes, a hematoma can develop after certain medical procedures. SIGNS AND SYMPTOMS   A firm lump on the body.  Possible pain and tenderness in the area.  Bruising.Blue, dark blue, purple-red, or yellowish skin may appear at the site of the hematoma if the hematoma is close to the surface of the skin. For hematomas in deeper tissues or body spaces, the signs and symptoms may be subtle. For example, an intra-abdominal hematoma may cause abdominal pain, weakness, fainting, and shortness of breath. An intracranial hematoma may cause a headache or symptoms such as weakness, trouble speaking, or a change in consciousness. DIAGNOSIS  A hematoma can usually be diagnosed based on your medical history and a physical exam. Imaging tests may be needed if your health care provider suspects a hematoma in deeper tissues or body spaces, such as the abdomen, head, or chest. These tests may include  ultrasonography or a CT scan.  TREATMENT  Hematomas usually go away on their own over time. Rarely does the blood need to be drained out of the body. Large hematomas or those that may affect vital organs will sometimes need surgical drainage or monitoring. HOME CARE INSTRUCTIONS   Apply ice to the injured area:   Put ice in a plastic bag.   Place a towel between your skin and the bag.   Leave the ice on for 20 minutes, 2-3 times a day for the first 1 to 2 days.   After the first 2 days, switch to using warm compresses on the hematoma.   Elevate the injured area to help decrease pain and swelling. Wrapping the area with an elastic bandage may also be helpful. Compression helps to reduce swelling and promotes shrinking of the hematoma. Make sure the bandage is not wrapped too tight.   If your hematoma is on a lower extremity and is painful, crutches may be helpful for a couple days.   Only take over-the-counter or prescription medicines as directed by your health care provider. SEEK IMMEDIATE MEDICAL CARE IF:   You have increasing pain, or your pain is not controlled with medicine.   You have a fever.   You have worsening swelling or discoloration.   Your skin over the hematoma breaks or starts bleeding.   Your hematoma is in your chest or abdomen and you have weakness, shortness of breath, or a change in consciousness.  Your hematoma is on  your scalp (caused by a fall or injury) and you have a worsening headache or a change in alertness or consciousness. MAKE SURE YOU:   Understand these instructions.  Will watch your condition.  Will get help right away if you are not doing well or get worse.   This information is not intended to replace advice given to you by your health care provider. Make sure you discuss any questions you have with your health care provider.   Document Released: 09/27/2003 Document Revised: 10/15/2012 Document Reviewed: 07/23/2012 Elsevier  Interactive Patient Education [employer].

## 2015-07-25 NOTE — ED Notes (Signed)
Patient transported to X-ray 

## 2015-07-25 NOTE — ED Provider Notes (Signed)
CSN: [id number]     Arrival date & time 07/25/15  0702 History   First MD Initiated Contact with Patient 07/25/15 [phone]     Chief Complaint  Patient presents with  . Arm Pain     (Consider location/radiation/quality/duration/timing/severity/associated sxs/prior Treatment) HPI Comments: 80 year old female with history of hypertension, hyperlipidemia, coronary artery disease status post stent 3 days ago presents for left arm pain and swelling. The patient reports that over the last 2 weeks she has had an area of swelling feels like a knot in her left upper extremity. She reports that it is started feeling like it as a fever in it and it seemed to spread down towards her elbow. She denies any trauma to the area. She reports that this was present before her cardiac catheterization on Friday. She did have an IV placed in that arm on Friday but did not have any IVs or other intravascular devices on that side. Her catheterization was done through the right radial.  Patient says she otherwise feels well. She has normal use of her arm.   Past Medical History  Diagnosis Date  . Essential hypertension, benign   . Osteoporosis   . Arthritis   . Hyperlipidemia     Diet controlled.cannot take meds  . Apical variant hypertrophic cardiomyopathy ([place])   . Anxiety   . History of kidney stones   . Vertigo   . History of skin cancer     Basal cell - abdomen  . Nocturia   . Borderline diabetes mellitus   . Coronary atherosclerosis of native coronary artery     a. 2011: Cath showing nonobstructive CAD - 50-60% LAD (normal FFR) b. 06/2015: Cath showing 80% stenosis in the Mid LAD, tx w/ Promus DES 2.25 mm x 16 mm   . Shortness of breath dyspnea   . Bursitis of left hip   . Bruises easily   . Overactive bladder    Past Surgical History  Procedure Laterality Date  . Abdominal hysterectomy    . Hemorrhoid surgery    . Eye surgery      cataracts bilateral  . Inguinal hernia repair  10/11/2011   Procedure: HERNIA REPAIR INGUINAL ADULT;  Surgeon: Stark Klein, MD;  Location: [facility];  Service: General;  Laterality: Right;  . Open surgical repair of gluteal tendon Left 03/12/2014    Procedure: LEFT HIP BURSECTOMY AND GLUTEAL TENDON REPAIR;  Surgeon: [name], MD;  Location: [facility];  Service: Orthopedics;  Laterality: Left;  . Cardiac catheterization N/A 07/22/2015    Procedure: Left Heart Cath and Coronary Angiography;  Surgeon: Leonie Man, MD;  Location: [facility];  Service: Cardiovascular;  Laterality: N/A;  . Cardiac catheterization N/A 07/22/2015    Procedure: Coronary Stent Intervention;  Surgeon: Leonie Man, MD;  Location: [facility];  Service: Cardiovascular;  Laterality: N/A;  . Coronary stent placement  07/22/2015    mid lad  des    Family History  Problem Relation Age of Onset  . Heart disease Father   . Lung cancer Sister   . Lung cancer Brother    Social History  Substance Use Topics  . Smoking status: Never Smoker   . Smokeless tobacco: Never Used  . Alcohol Use: No   OB History    Gravida Para Term Preterm AB TAB SAB Ectopic Multiple Living   4 4 4       4      Review of Systems  Constitutional: Negative for fever, chills and fatigue.  HENT: Negative for congestion, postnasal drip and rhinorrhea.   Respiratory: Negative for cough, chest tightness, shortness of breath and wheezing.   Cardiovascular: Negative for chest pain and palpitations.  Gastrointestinal: Negative for nausea, vomiting, abdominal pain, diarrhea, constipation and blood in stool.  Genitourinary: Negative for dysuria, urgency and frequency.  Musculoskeletal: Negative for myalgias and back pain.  Skin: Positive for color change (reddish over apparent knot in her arm). Negative for rash.  Neurological: Negative for dizziness, weakness, numbness and headaches.  Hematological: Does not bruise/bleed easily.      Allergies  Chlorhexidine; Vancomycin; Aspirin;  Atorvastatin; Bactrim; Cephalosporins; Ciprofloxacin; Clarithromycin; Colestipol hcl; Ezetimibe; Simvastatin; Penicillins; and Tape  Home Medications   Prior to Admission medications   Medication Sig Start Date End Date Taking? Authorizing Provider  aspirin EC 81 MG tablet Take 81 mg by mouth daily.   Yes Historical Provider, MD  clopidogrel (PLAVIX) 75 MG tablet Take 1 tablet (75 mg total) by mouth daily with breakfast. 07/23/15  Yes Erma Heritage, PA  Cyanocobalamin (VITAMIN B12 PO) Take 1 tablet by mouth daily.   Yes Historical Provider, MD  fluticasone (FLONASE) 50 MCG/ACT nasal spray USE TWO SPRAY IN EACH NOSTRIL EVERY DAY Patient taking differently: USE TWO SPRAY IN EACH NOSTRIL EVERY DAY AS NEEDED FOR ALLERGIES. 07/14/12  Yes Susy Frizzle, MD  metoprolol tartrate (LOPRESSOR) 25 MG tablet Take 0.5 tablets (12.5 mg total) by mouth 2 (two) times daily. 07/12/15  Yes [name], MD  Multiple Vitamins-Minerals (OCUVITE PO) Take 1 tablet by mouth daily.   Yes Historical Provider, MD  nitroGLYCERIN (NITROSTAT) 0.4 MG SL tablet Place 1 tablet (0.4 mg total) under the tongue every 5 (five) minutes as needed for chest pain. 08/26/14  Yes [name], MD  Polyethyl Glycol-Propyl Glycol (SYSTANE OP) Apply 1 drop to eye 3 (three) times daily as needed (Dry eyes).   Yes Historical Provider, MD  ranolazine (RANEXA) 500 MG 12 hr tablet Take 1 tablet (500 mg total) by mouth 2 (two) times daily. 04/29/15  Yes [name], MD   BP 121/68 mmHg  Pulse 63  Temp(Src) 97.8 F (36.6 C) (Oral)  Resp 12  SpO2 100% Physical Exam  Constitutional: She is oriented to person, place, and time. She appears well-developed and well-nourished. No distress.  HENT:  Head: Normocephalic and atraumatic.  Right Ear: External ear normal.  Left Ear: External ear normal.  Nose: Nose normal.  Mouth/Throat: Oropharynx is clear and moist. No oropharyngeal exudate.  Eyes: EOM are normal. Pupils are equal,  round, and reactive to light.  Neck: Normal range of motion. Neck supple.  Cardiovascular: Normal rate, regular rhythm and intact distal pulses.   Murmur heard. Pulses:      Radial pulses are 2+ on the right side, and 2+ on the left side.  Pulmonary/Chest: Effort normal. No respiratory distress. She has no wheezes. She has no rales.  Abdominal: Soft. She exhibits no distension. There is no tenderness.  Musculoskeletal: Normal range of motion. She exhibits no edema.       Left shoulder: Normal.       Left elbow: Normal.       Left upper arm: She exhibits tenderness and swelling.       Arms: Full range of motion of the shoulder and elbow without increased pain  Neurological: She is alert and oriented to person, place, and time.  Skin: Skin is warm and dry.  No rash noted. She is not diaphoretic.  Vitals reviewed.   ED Course  Procedures (including critical care time) Labs Review Labs Reviewed - No data to display  Imaging Review [place] Extrem Up Left Ltd  07/25/2015  CLINICAL DATA:  Pain and swelling in the left upper extremity for 2 weeks. Patient recently initiated Plavix therapy. No fever. No reported injury. EXAM: ULTRASOUND LEFT UPPER EXTREMITY LIMITED TECHNIQUE: Ultrasound examination of the upper extremity soft tissues was performed in the area of clinical concern. COMPARISON:  None. FINDINGS: Targeted ultrasound of the area of symptomatic concern as indicated by the patient in the proximal left upper extremity (approximately midway between the shoulder and elbow) demonstrates an indistinct intramuscular area of heterogeneous echogenicity measuring approximately 2.3 x 1.1 x 2.0 cm in size. No definite discrete mass. No extramuscular fluid collections in the area of symptomatic concern. IMPRESSION: Indistinct 2.3 x 1.1 x 2.0 cm intramuscular region of heterogeneous echogenicity at the area of symptomatic concern as indicated by the patient in the proximal left upper extremity. No definite  discrete mass. Findings could represent an intramuscular hematoma. If the patient's symptoms persist or worsen, consider MRI of the proximal left upper extremity without and with IV contrast to exclude an underlying mass. Electronically Signed   By: Ilona Sorrel M.D.   On: 07/25/2015 08:36   Dg Humerus Left  07/25/2015  CLINICAL DATA:  Pain and swelling in the left upper extremity for 2 weeks. No reported injury. EXAM: LEFT HUMERUS - 2+ VIEW COMPARISON:  None. FINDINGS: There is no evidence of fracture or other focal bone lesions. Soft tissues are unremarkable. IMPRESSION: Negative. Electronically Signed   By: Ilona Sorrel M.D.   On: 07/25/2015 08:40   I have personally reviewed and evaluated these images and lab results as part of my medical decision-making.   EKG Interpretation None      MDM  Patient seen and evaluated in stable condition. Patient neurovascularly intact. Pain is not reproducible on range of motion of the shoulder or the elbow. Does have some tenderness over the area of swelling. Area does not appear infected or cellulitic. Doppler negative for DVT but there is a small superficial thrombus. Ultrasound shows intramuscular area of heterogeneous echogenicity most likely related to hematoma. On further history taking family reports that the patient has been doing a lot of lifting of objects in her basement. She also continues to get out her [employer] and cut her grass. Likely the patient has had some muscle strain in the area causing this hematoma. She will be referred to orthopedics outpatient. She is a patient of Dr. Peri Maris and so will be referred to [place]. All results and plan of care were discussed at length with the patient and her children. They expressed understanding and agreement with plan of care. Final diagnoses:  None    1. Hematoma    Harvel Quale, MD 07/25/15 1019

## 2015-07-25 NOTE — ED Notes (Signed)
Patient transported to vascular. 

## 2015-07-26 MED FILL — Heparin Sodium (Porcine) Inj 1000 Unit/ML: INTRAMUSCULAR | Qty: 10 | Status: AC

## 2015-07-27 ENCOUNTER — Telehealth: Payer: Self-pay | Admitting: Internal Medicine

## 2015-07-27 NOTE — Telephone Encounter (Signed)
Close encounter 

## 2015-07-28 DIAGNOSIS — M79602 Pain in left arm: Secondary | ICD-10-CM | POA: Diagnosis not present

## 2015-07-28 DIAGNOSIS — R2232 Localized swelling, mass and lump, left upper limb: Secondary | ICD-10-CM | POA: Diagnosis not present

## 2015-08-26 ENCOUNTER — Encounter: Payer: Self-pay | Admitting: Internal Medicine

## 2015-08-26 ENCOUNTER — Ambulatory Visit (INDEPENDENT_AMBULATORY_CARE_PROVIDER_SITE_OTHER): Payer: Medicare Other | Admitting: Internal Medicine

## 2015-08-26 VITALS — BP 118/68 | HR 59 | Ht 67.0 in | Wt 149.0 lb

## 2015-08-26 DIAGNOSIS — I422 Other hypertrophic cardiomyopathy: Secondary | ICD-10-CM

## 2015-08-26 DIAGNOSIS — E785 Hyperlipidemia, unspecified: Secondary | ICD-10-CM | POA: Diagnosis not present

## 2015-08-26 DIAGNOSIS — Z9861 Coronary angioplasty status: Secondary | ICD-10-CM | POA: Diagnosis not present

## 2015-08-26 DIAGNOSIS — I10 Essential (primary) hypertension: Secondary | ICD-10-CM

## 2015-08-26 DIAGNOSIS — I2510 Atherosclerotic heart disease of native coronary artery without angina pectoris: Secondary | ICD-10-CM | POA: Diagnosis not present

## 2015-08-26 NOTE — Progress Notes (Signed)
OFFICE NOTE  Chief Complaint:  Follow-up stent  Primary Care Physician: Odette Fraction, MD  HPI:  Lauren House is a pleasant 80 year old female I have been following for history of apical hypertrophic cardiomyopathy for which she has done well on Ranexa 500 mg daily. Recently, she was having right inguinal pain and had a hernia for which she had repair. That, unfortunately, she says has caused her more harm than good and she continues to have more pain in her right hip area which possibly is related to the hip. She saw Dr. Wynelle Link at [facility] for further evaluation from that and has received 2 hip injections which have been beneficial. He did mention that he felt she might need a hip replacement. Recently she had a recurrent episode of chest pain. This caused her to the hospital and improved with nitroglycerin. She was ruled out for MI and discharged. Unfortunately she returned several days later after she developed swelling, warmth and pain in her left forearm. It was felt that she might have possibly had an infected IV site and clearly had superficial thrombophlebitis. She was placed on antibiotics for this and is being treated with some improvement in her symptoms. After her emergency room visit, it was recommended that she increase her Ranexa 500 mg twice daily which is the recommended starting dose. She's had no further chest pain episodes. She had prior cardiac catheterization in 2011 which demonstrated a 50-60% LAD lesion. She underwent FFR for this which was negative with an FFR of 0.99. She had had a stress test at that time which indicated no evidence of reversible ischemia., but was having persistent chest pain.  It is been felt that her chest pain was due to small vessel disease in the setting of significant apical variant hypertrophic cardiomyopathy. This has been treated most effectively with ranolazine, and nitrates to some extent. Although her symptoms now have  escalated. This is concerning to me for possible worsening of her LAD disease.  Mrs. Stroud underwent a nuclear stress test on 02/03/2013. This was negative for ischemia with an EF of 52%. Since then she has reported some improvement in her symptoms. Her main complaints now have to do with left hip pain.  She is still contemplating surgery. She reported that her husband was recently diagnosed with esophageal cancer and it seems to be quite advanced. There did not appear to be any surgical options. This has caused her significant anxiety but she reports no chest pain.  Lauren House returns today for follow-up. Unfortunately she mentioned that her husband died about 3 months ago. He was diagnosed with esophageal cancer which was aggressive and inoperable. She is still grieving which is to be expected. She continues to have problems with her hip and at this point apparently will need hip surgery, but probably not hip replacement. That is scheduled about one month from now. She does have a history of moderate LAD stenosis in 2011 with a negative stress test one year ago. Recently, she has had worsening shortness of breath and chest pressure with exertion. She initially felt this was due to stress however her symptoms have been somewhat progressive and concern me for ischemia. Her EKG shows stable T-wave inversions consistent with her hypertrophic cardiomyopathy.  I saw Lauren House back in the office today. Overall she feels like she is doing well. She did have some mild chest discomfort last week and took a nitroglycerin. This is when she was in [place] with her son and  daughter-in-law and they were walking all over town. She managed to walk for several hours without any significant shortness of breath but did have some mild chest discomfort which was relieved fairly quickly with nitroglycerin. Overall she is doing well. She had her hip replacement on the left side which she is recovering from. She still has discomfort  when sitting in a car for long periods of time. She tells me that she's given a be going up to [place] fairly shortly on a vacation and that they will be going up to [facility] for the first time.'  Lauren House returns today for follow-up. She tells me on Saturday night she was awakened early in the morning with palpitations and a warm feeling as well as heaviness in her chest. She says it was quite intense. She denies any vivid dreams prior to that. She's not had any further episodes of chest pain since that or recurrent palpitations. She took a nitroglycerin and the symptoms resolved fairly promptly. She was able to go back to bed. Her EKGs is unchanged showing the T-wave inversions typical of apical variant hypertrophic cardiomyopathy. Her last stress test was in 2014 it was negative for ischemia. She does have a known 50-60% LAD stenosis which was negative by FFR.   07/12/2015  Lauren House was seen today in the office for follow-up. Recently she went to vacation with her family and they noticed more progressive shortness of breath and chest pressure. This was keeping her from doing certain activities. She says her symptoms of gotten worse since her last office visit in December. At that time I performed a nuclear stress test which showed a low normal EF 51% but no reversible ischemia. She does have known 50-60% LAD stenosis which was negative by FFR by catheterization in 2012.  08/26/2015  Lauren House returns today for follow-up. She underwent cardiac catheterization for progressive chest pain concerning for unstable angina on 07/22/2015 by Dr. Ellyn Hack, this demonstrated the following:  1. Mid LAD lesion, 80% stenosed. Post intervention with Promus DES 2.25 mm x 16 mm (2.4 mm), there is a 0% residual stenosis. 2. Ost LAD lesion, 50% stenosed. stable from original catheterization 3. The left ventricular systolic function is normal. with minimal apical filling consistent with known apical  hypertrophy. 4. Otherwise mild to moderate disease in the circumflex and RCA system  Since her coronary angiography and stent placement, she reports she is doing fairly well. She started back doing some exercise and push mowed her lawn the other day. Unfortunate she developed a cracked tooth and she's been having problems with pain. She took antibiotics including doxycycline which causes her stomach upset. She has a temporary crown and will be going back for some more dental work. On questioning today she reports that she has discontinued her Plavix. She said she's been off of it for about one week because of spontaneous bruising. She was not counseled to discontinue this and I reiterated the fact today that being off of dual antiplatelet therapy could put her at risk for acute in-stent thrombosis which could lead to massive heart attack.  PMHx:  Past Medical History  Diagnosis Date  . Essential hypertension, benign   . Osteoporosis   . Arthritis   . Hyperlipidemia     Diet controlled.cannot take meds  . Apical variant hypertrophic cardiomyopathy ([place])   . Anxiety   . History of kidney stones   . Vertigo   . History of skin cancer  Basal cell - abdomen  . Nocturia   . Borderline diabetes mellitus   . Coronary atherosclerosis of native coronary artery     a. 2011: Cath showing nonobstructive CAD - 50-60% LAD (normal FFR) b. 06/2015: Cath showing 80% stenosis in the Mid LAD, tx w/ Promus DES 2.25 mm x 16 mm   . Shortness of breath dyspnea   . Bursitis of left hip   . Bruises easily   . Overactive bladder     Past Surgical History  Procedure Laterality Date  . Abdominal hysterectomy    . Hemorrhoid surgery    . Eye surgery      cataracts bilateral  . Inguinal hernia repair  10/11/2011    Procedure: HERNIA REPAIR INGUINAL ADULT;  Surgeon: Stark Klein, MD;  Location: [facility];  Service: General;  Laterality: Right;  . Open surgical repair of gluteal tendon Left 03/12/2014     Procedure: LEFT HIP BURSECTOMY AND GLUTEAL TENDON REPAIR;  Surgeon: [name], MD;  Location: [facility];  Service: Orthopedics;  Laterality: Left;  . Cardiac catheterization N/A 07/22/2015    Procedure: Left Heart Cath and Coronary Angiography;  Surgeon: Leonie Man, MD;  Location: [facility];  Service: Cardiovascular;  Laterality: N/A;  . Cardiac catheterization N/A 07/22/2015    Procedure: Coronary Stent Intervention;  Surgeon: Leonie Man, MD;  Location: [facility];  Service: Cardiovascular;  Laterality: N/A;  . Coronary stent placement  07/22/2015    mid lad  des     FAMHx:  Family History  Problem Relation Age of Onset  . Heart disease Father   . Lung cancer Sister   . Lung cancer Brother     SOCHx:   reports that she has never smoked. She has never used smokeless tobacco. She reports that she does not drink alcohol or use illicit drugs.  ALLERGIES:  Allergies  Allergen Reactions  . Chlorhexidine Itching  . Vancomycin Hives  . Aspirin Other (See Comments)    Nose bleeds with full strength  . Atorvastatin Other (See Comments)    Muscle pain  . Bactrim [Sulfamethoxazole-Trimethoprim] Other (See Comments)    Doesn't remember   . Cephalosporins Hives and Itching  . Ciprofloxacin Nausea Only  . Clarithromycin Other (See Comments)    Doesn't remember   . Colestipol Hcl Other (See Comments)    Hallucinations  . Ezetimibe Other (See Comments)    GI upset  . Simvastatin Other (See Comments)    Muscle spasms  . Penicillins Rash    Has patient had a PCN reaction causing immediate rash, facial/tongue/throat swelling, SOB or lightheadedness with hypotension: yes Has patient had a PCN reaction causing severe rash involving mucus membranes or skin necrosis: no Has patient had a PCN reaction that required hospitalization no Has patient had a PCN reaction occurring within the last 10 years: no If all of the above answers are "NO", then may proceed with  Cephalosporin use.   . Tape Rash    ROS: Pertinent items noted in HPI and remainder of comprehensive ROS otherwise negative.  HOME MEDS: Current Outpatient Prescriptions  Medication Sig Dispense Refill  . aspirin EC 81 MG tablet Take 81 mg by mouth daily.    . clopidogrel (PLAVIX) 75 MG tablet Take 1 tablet (75 mg total) by mouth daily with breakfast. 30 tablet 11  . Cyanocobalamin (VITAMIN B12 PO) Take 1 tablet by mouth daily.    . fluticasone (FLONASE) 50 MCG/ACT nasal spray  USE TWO SPRAY IN EACH NOSTRIL EVERY DAY (Patient taking differently: USE TWO SPRAY IN EACH NOSTRIL EVERY DAY AS NEEDED FOR ALLERGIES.) 50 g 1  . metoprolol tartrate (LOPRESSOR) 25 MG tablet Take 0.5 tablets (12.5 mg total) by mouth 2 (two) times daily. 30 tablet 11  . Multiple Vitamins-Minerals (OCUVITE PO) Take 1 tablet by mouth daily.    . nitroGLYCERIN (NITROSTAT) 0.4 MG SL tablet Place 1 tablet (0.4 mg total) under the tongue every 5 (five) minutes as needed for chest pain. 25 tablet 3  . Polyethyl Glycol-Propyl Glycol (SYSTANE OP) Apply 1 drop to eye 3 (three) times daily as needed (Dry eyes).    . ranolazine (RANEXA) 500 MG 12 hr tablet Take 1 tablet (500 mg total) by mouth 2 (two) times daily. 180 tablet 3   No current facility-administered medications for this visit.    LABS/IMAGING: No results found for this or any previous visit (from the past 48 hour(s)). No results found.  VITALS: There were no vitals taken for this visit.  EXAM: GEN: Awake, in no apparent distress HEENT: PERRLA, EOMI Lungs: Clear Cardiovascular: Regular rate and rhythm normal S1-S2 Abdomen: Soft, nontender positive bowel sounds Extremities: No edema Neurologic: No focal deficits Psychiatric: Normal mood, affect  EKG: Sinus bradycardia 59, voltage criteria for LVH with anterolateral T-wave inversions consistent with known diagnosis of apical hypertrophic cardiomyopathy  ASSESSMENT: 1. Unstable angina - status post PCI  with a Promus DES (2.25 mm x 16 mm to the mid LAD)- known 50-60% ostial LAD  stenosis (FFR 0.99 in 2011) which was stable at repeat cath (06/2015) 2. Apical variant hypertrophic cardimyopathy - anterolateral TWI's - negative nuclear stress test 01-2015 3. Hypertension 4. Venous insufficiency 5. Right inguinal pain with a hernia, status post surgical repair 6. Right hip pain with degenerative hip disease - s/p surgery  PLAN: 1.   Mrs. Gevorkyan denies any chest pain and is back doing some physical activity. Unfortunately she stopped taking Plavix about 1 week ago spontaneously for bruising. I underscored the critical importance of her taking this medicine along with aspirin because of the high risk of in-stent thrombosis given her recent stent. She understands that she cannot discontinue aspirin or Plavix electively within the next year, including for any dental procedures or other procedures which may pose a high bleeding risk. She understood this and indicated to me that she wouldn't restart taking her Plavix today. At this point I feel she could proceed with cardiac rehabilitation which is scheduled at [facility] in [place]. Follow-up with me in 3 months to review her progress with cardiac rehabilitation.  [name], MD, [facility] Attending Cardiologist Whitney C Hilty 08/26/2015, 8:47 AM

## 2015-08-26 NOTE — Patient Instructions (Signed)
Medication Instructions:  START Plavix 75mg  take 1 tablet my mouth daily   Labwork: None  Testing/Procedures: None  Follow-Up: 3 Months with Dr Debara Pickett  Any Other Special Instructions Will Be Listed Below (If Applicable). You have been referred to Cardiac Rehab at [facility]    If you need a refill on your cardiac medications before your next appointment, please call your pharmacy.

## 2015-09-29 ENCOUNTER — Ambulatory Visit (INDEPENDENT_AMBULATORY_CARE_PROVIDER_SITE_OTHER): Payer: Medicare Other | Admitting: Family Medicine

## 2015-09-29 ENCOUNTER — Encounter: Payer: Self-pay | Admitting: Family Medicine

## 2015-09-29 VITALS — BP 138/80 | HR 78 | Temp 98.3°F | Resp 18 | Ht 67.0 in | Wt 149.0 lb

## 2015-09-29 DIAGNOSIS — J208 Acute bronchitis due to other specified organisms: Secondary | ICD-10-CM

## 2015-09-29 MED ORDER — AZITHROMYCIN 250 MG PO TABS: ORAL_TABLET | ORAL | 0 refills | Status: DC

## 2015-09-29 NOTE — Progress Notes (Signed)
Subjective:    Patient ID: Lauren House, female    DOB: [birth year], 80 y.o.   MRN: [id number]  HPI Patient has been getting progressively sicker over the last 3 days. She is developing a worsening cough and chest congestion with right-sided pleurisy. She reports subjective fevers. Recently had cardiac stent 3 months ago. On examination today she has right basilar Rales posteriorly. Past Medical History:  Diagnosis Date  . Anxiety   . Apical variant hypertrophic cardiomyopathy ([place])   . Arthritis   . Borderline diabetes mellitus   . Bruises easily   . Bursitis of left hip   . Coronary atherosclerosis of native coronary artery    a. 2011: Cath showing nonobstructive CAD - 50-60% LAD (normal FFR) b. 06/2015: Cath showing 80% stenosis in the Mid LAD, tx w/ Promus DES 2.25 mm x 16 mm   . Essential hypertension, benign   . History of kidney stones   . History of skin cancer    Basal cell - abdomen  . Hyperlipidemia    Diet controlled.cannot take meds  . Nocturia   . Osteoporosis   . Overactive bladder   . Shortness of breath dyspnea   . Vertigo    Past Surgical History:  Procedure Laterality Date  . ABDOMINAL HYSTERECTOMY    . CARDIAC CATHETERIZATION N/A 07/22/2015   Procedure: Left Heart Cath and Coronary Angiography;  Surgeon: Leonie Man, MD;  Location: [facility];  Service: Cardiovascular;  Laterality: N/A;  . CARDIAC CATHETERIZATION N/A 07/22/2015   Procedure: Coronary Stent Intervention;  Surgeon: Leonie Man, MD;  Location: [facility];  Service: Cardiovascular;  Laterality: N/A;  . CORONARY STENT PLACEMENT  07/22/2015   mid lad  des   . EYE SURGERY     cataracts bilateral  . HEMORRHOID SURGERY    . INGUINAL HERNIA REPAIR  10/11/2011   Procedure: HERNIA REPAIR INGUINAL ADULT;  Surgeon: Stark Klein, MD;  Location: [facility];  Service: General;  Laterality: Right;  . OPEN SURGICAL REPAIR OF GLUTEAL TENDON Left 03/12/2014   Procedure: LEFT HIP BURSECTOMY  AND GLUTEAL TENDON REPAIR;  Surgeon: [name], MD;  Location: [facility];  Service: Orthopedics;  Laterality: Left;   Current Outpatient Prescriptions on File Prior to Visit  Medication Sig Dispense Refill  . aspirin EC 81 MG tablet Take 81 mg by mouth daily.    . Cyanocobalamin (VITAMIN B12 PO) Take 1 tablet by mouth daily.    . fluticasone (FLONASE) 50 MCG/ACT nasal spray USE TWO SPRAY IN EACH NOSTRIL EVERY DAY (Patient taking differently: USE TWO SPRAY IN EACH NOSTRIL EVERY DAY AS NEEDED FOR ALLERGIES.) 50 g 1  . metoprolol tartrate (LOPRESSOR) 25 MG tablet Take 0.5 tablets (12.5 mg total) by mouth 2 (two) times daily. 30 tablet 11  . Multiple Vitamins-Minerals (OCUVITE PO) Take 1 tablet by mouth daily.    . nitroGLYCERIN (NITROSTAT) 0.4 MG SL tablet Place 1 tablet (0.4 mg total) under the tongue every 5 (five) minutes as needed for chest pain. 25 tablet 3  . Polyethyl Glycol-Propyl Glycol (SYSTANE OP) Apply 1 drop to eye 3 (three) times daily as needed (Dry eyes).    . ranolazine (RANEXA) 500 MG 12 hr tablet Take 1 tablet (500 mg total) by mouth 2 (two) times daily. 180 tablet 3   No current facility-administered medications on file prior to visit.    Allergies  Allergen Reactions  . Chlorhexidine Itching  . Vancomycin Hives  .  Aspirin Other (See Comments)    Nose bleeds with full strength  . Atorvastatin Other (See Comments)    Muscle pain  . Bactrim [Sulfamethoxazole-Trimethoprim] Other (See Comments)    Doesn't remember   . Cephalosporins Hives and Itching  . Ciprofloxacin Nausea Only  . Clarithromycin Other (See Comments)    Doesn't remember   . Colestipol Hcl Other (See Comments)    Hallucinations  . Ezetimibe Other (See Comments)    GI upset  . Simvastatin Other (See Comments)    Muscle spasms  . Penicillins Rash    Has patient had a PCN reaction causing immediate rash, facial/tongue/throat swelling, SOB or lightheadedness with hypotension: yes Has patient had a PCN  reaction causing severe rash involving mucus membranes or skin necrosis: no Has patient had a PCN reaction that required hospitalization no Has patient had a PCN reaction occurring within the last 10 years: no If all of the above answers are "NO", then may proceed with Cephalosporin use.   . Tape Rash   Social History   Social History  . Marital status: Widowed    Spouse name: N/A  . Number of children: N/A  . Years of education: N/A   Occupational History  . Not on file.   Social History Main Topics  . Smoking status: Never Smoker  . Smokeless tobacco: Never Used  . Alcohol use No  . Drug use: No  . Sexual activity: Not on file   Other Topics Concern  . Not on file   Social History Narrative  . No narrative on file      Review of Systems  All other systems reviewed and are negative.      Objective:   Physical Exam  Constitutional: She appears well-developed and well-nourished.  HENT:  Right Ear: External ear normal.  Left Ear: External ear normal.  Nose: Nose normal.  Mouth/Throat: Oropharynx is clear and moist. No oropharyngeal exudate.  Eyes: Conjunctivae are normal. Pupils are equal, round, and reactive to light. Right eye exhibits no discharge. Left eye exhibits no discharge.  Neck: Neck supple.  Cardiovascular: Normal rate, regular rhythm and normal heart sounds.   No murmur heard. Pulmonary/Chest: Effort normal. She has rales.  Abdominal: Soft. Bowel sounds are normal.  Lymphadenopathy:    She has no cervical adenopathy.  Vitals reviewed.         Assessment & Plan:  Acute bronchitis due to other specified organisms - Plan: azithromycin (ZITHROMAX) 250 MG tablet  Begin a Z-Pak. Add Mucinex to help break up the congestion in her right posterior lower lobe. Recheck in one week if not better or sooner if worse

## 2015-10-11 DIAGNOSIS — H524 Presbyopia: Secondary | ICD-10-CM | POA: Diagnosis not present

## 2015-10-18 ENCOUNTER — Encounter (HOSPITAL_COMMUNITY): Payer: Self-pay

## 2015-10-18 ENCOUNTER — Encounter (HOSPITAL_COMMUNITY)
Admission: RE | Admit: 2015-10-18 | Discharge: 2015-10-18 | Disposition: A | Discharge status: Home / Self Care | Payer: Medicare Other | Source: Ambulatory Visit | Attending: Internal Medicine | Admitting: Internal Medicine

## 2015-10-18 VITALS — BP 132/72 | HR 66 | Ht 67.0 in | Wt 150.4 lb

## 2015-10-18 DIAGNOSIS — Z955 Presence of coronary angioplasty implant and graft: Secondary | ICD-10-CM | POA: Insufficient documentation

## 2015-10-18 NOTE — Progress Notes (Signed)
Cardiac Individual Treatment Plan  Patient Details  Name: Lauren House MRN: [id number] Date of Birth: 1935-05-22 Referring Provider:   Flowsheet Row CARDIAC REHAB PHASE II ORIENTATION from 10/18/2015 in [facility]  Referring Provider  Dr. Ellyn Hack       Initial Encounter Date:  Flowsheet Row CARDIAC REHAB PHASE II ORIENTATION from 10/18/2015 in [facility]  Date  10/18/15  Referring Provider  Dr. Ellyn Hack       Visit Diagnosis: Stented coronary artery  Patient's Home Medications on Admission:  Current Outpatient Prescriptions:  .  aspirin EC 81 MG tablet, Take 81 mg by mouth daily., Disp: , Rfl:  .  clopidogrel (PLAVIX) 75 MG tablet, Take 75 mg by mouth daily., Disp: , Rfl:  .  Cyanocobalamin (VITAMIN B12 PO), Take 1 tablet by mouth daily., Disp: , Rfl:  .  fluticasone (FLONASE) 50 MCG/ACT nasal spray, USE TWO SPRAY IN EACH NOSTRIL EVERY DAY (Patient taking differently: USE TWO SPRAY IN EACH NOSTRIL EVERY DAY AS NEEDED FOR ALLERGIES.), Disp: 50 g, Rfl: 1 .  metoprolol tartrate (LOPRESSOR) 25 MG tablet, Take 0.5 tablets (12.5 mg total) by mouth 2 (two) times daily., Disp: 30 tablet, Rfl: 11 .  Multiple Vitamins-Minerals (OCUVITE PO), Take 1 tablet by mouth daily., Disp: , Rfl:  .  nitroGLYCERIN (NITROSTAT) 0.4 MG SL tablet, Place 1 tablet (0.4 mg total) under the tongue every 5 (five) minutes as needed for chest pain., Disp: 25 tablet, Rfl: 3 .  Polyethyl Glycol-Propyl Glycol (SYSTANE OP), Apply 1 drop to eye 3 (three) times daily as needed (Dry eyes)., Disp: , Rfl:  .  ranolazine (RANEXA) 500 MG 12 hr tablet, Take 1 tablet (500 mg total) by mouth 2 (two) times daily., Disp: 180 tablet, Rfl: 3  Past Medical History: Past Medical History:  Diagnosis Date  . Anxiety   . Apical variant hypertrophic cardiomyopathy ([place])   . Arthritis   . Borderline diabetes mellitus   . Bruises easily   . Bursitis of left hip   . Coronary atherosclerosis  of native coronary artery    a. 2011: Cath showing nonobstructive CAD - 50-60% LAD (normal FFR) b. 06/2015: Cath showing 80% stenosis in the Mid LAD, tx w/ Promus DES 2.25 mm x 16 mm   . Essential hypertension, benign   . History of kidney stones   . History of skin cancer    Basal cell - abdomen  . Hyperlipidemia    Diet controlled.cannot take meds  . Nocturia   . Osteoporosis   . Overactive bladder   . Shortness of breath dyspnea   . Vertigo     Tobacco Use: History  Smoking Status  . Never Smoker  Smokeless Tobacco  . Never Used    Labs: Recent Review Flowsheet Data    Labs for ITP Cardiac and Pulmonary Rehab Latest Ref Rng & Units 08/20/2005 09/02/2008 02/10/2010 12/23/2012   Cholestrol 0 - 200 mg/dL 297 267(H) 249 ATP III CLASSIFICATION: <200     mg/dL   Desirable 200-239  mg/dL   Borderline High >=240    mg/dL   High(H) 287(H)   LDLCALC 0 - 99 mg/dL 207 177(H) 174 Total Cholesterol/HDL:CHD Risk Coronary Heart Disease Risk Table Men   Women 1/2 Average Risk   3.4   3.3 Average Risk       5.0   4.4 2 X Average Risk   9.6   7.1 3 X Average Risk  23.4   11.0  Use the calculated Patient Ratio above and the CHD Risk Table to determine the patient's CHD Risk. ATP III CLASSIFICATION (LDL): <100     mg/dL   Optimal 100-129  mg/dL   Near or Above Optimal 130-159  mg/dL   Borderline 160-189  mg/dL   High >190     mg/dL   Very High(H) 166(H)   HDL >39 mg/dL 46 51 36(L) 47   Trlycerides <150 mg/dL 222 193(H) 196(H) 368(H)   Hemoglobin A1c <5.7 % - - 6.3 (NOTE)                                                                       According to the ADA Clinical Practice Recommendations for 2011, when HbA1c is used as a screening test:   >=6.5%   Diagnostic of Diabetes Mellitus           (if abnormal result is confirmed)  5.7-6.4%   Increased risk of developing Diabetes Mellitus  References:Diagnosis and Classification of Diabetes Mellitus,Diabetes [id number] 1):S62-S69  and Standards of Medical Care in         Diabetes - 2011,Diabetes [id number] (Suppl 1):S11-S61.(H) -      Capillary Blood Glucose: Lab Results  Component Value Date   GLUCAP 93 10/11/2011   GLUCAP 109 (H) 10/11/2011   GLUCAP 107 (H) 02/10/2010     Exercise Target Goals: Date: 10/18/15  Exercise Program Goal: Individual exercise prescription set with THRR, safety & activity barriers. Participant demonstrates ability to understand and report RPE using BORG scale, to self-measure pulse accurately, and to acknowledge the importance of the exercise prescription.  Exercise Prescription Goal: Starting with aerobic activity 30 plus minutes a day, 3 days per week for initial exercise prescription. Provide home exercise prescription and guidelines that participant acknowledges understanding prior to discharge.  Activity Barriers & Risk Stratification:     Activity Barriers & Cardiac Risk Stratification - 10/18/15 1645      Activity Barriers & Cardiac Risk Stratification   Activity Barriers Joint Problems  due to hip tendon surgery it would be difficult to walk on TM.    Cardiac Risk Stratification High      6 Minute Walk:     6 Minute Walk    Row Name 10/19/15 1407 10/19/15 1408       6 Minute Walk   Phase Initial Initial    Distance  - 1200 feet    Walk Time  - 6 minutes    # of Rest Breaks  - 0    MPH  - 2.27    METS  - 2.74    RPE  - 11    Perceived Dyspnea   - 9    VO2 Peak  - 7.4    Symptoms  - No    Resting HR  - 66 bpm    Resting BP  - 132/72    Max Ex. HR  - 89 bpm    Max Ex. BP  - 174/72    2 Minute Post BP  - 140/74       Initial Exercise Prescription:     Initial Exercise Prescription - 10/19/15 1400      Date of Initial Exercise RX and Referring Provider  Date 10/18/15   Referring Provider Dr. Lorin Glass   Level 2   Watts 15   Minutes 15   METs 1.9     Arm Ergometer   Level 2   Watts 15   Minutes 20   METs 1.9      Prescription Details   Frequency (times per week) 3   Duration Progress to 30 minutes of continuous aerobic without signs/symptoms of physical distress     Intensity   THRR REST +  30   THRR 40-80% of Max Heartrate 96-112-127   Ratings of Perceived Exertion 11-13   Perceived Dyspnea 0-4     Progression   Progression Continue progressive overload as per policy without signs/symptoms or physical distress.     Resistance Training   Training Prescription Yes   Weight 1   Reps 10-12      Perform Capillary Blood Glucose checks as needed.  Exercise Prescription Changes:   Exercise Comments:    Discharge Exercise Prescription (Final Exercise Prescription Changes):   Nutrition:  Target Goals: Understanding of nutrition guidelines, daily intake of sodium 1500mg , cholesterol 200mg , calories 30% from fat and 7% or less from saturated fats, daily to have 5 or more servings of fruits and vegetables.  Biometrics:     Pre Biometrics - 10/19/15 1410      Pre Biometrics   Height 5\' 7"  (1.702 m)   Weight 150 lb 5.7 oz (68.2 kg)   Waist Circumference 36.5 inches   Hip Circumference 36.5 inches   Waist to Hip Ratio 1 %   BMI (Calculated) 23.6   Triceps Skinfold 4 mm   % Body Fat 28 %   Grip Strength 54.7 kg   Flexibility 12.25 in   Single Leg Stand 20 seconds       Nutrition Therapy Plan and Nutrition Goals:   Nutrition Discharge: Rate Your Plate Scores:     Nutrition Assessments - 10/18/15 1659      MEDFICTS Scores   Pre Score 30      Nutrition Goals Re-Evaluation:   Psychosocial: Target Goals: Acknowledge presence or absence of depression, maximize coping skills, provide positive support system. Participant is able to verbalize types and ability to use techniques and skills needed for reducing stress and depression.  Initial Review & Psychosocial Screening:     Initial Psych Review & Screening - 10/18/15 1701      Initial Review   Current issues with  Current Sleep Concerns     Family Dynamics   Good Support System? Yes     Barriers   Psychosocial barriers to participate in program There are no identifiable barriers or psychosocial needs.     Screening Interventions   Interventions Encouraged to exercise      Quality of Life Scores:     Quality of Life - 10/19/15 1412      Quality of Life Scores   Health/Function Pre 18 %   Socioeconomic Pre 22.5 %   Psych/Spiritual Pre 24.58 %   Family Pre 30 %   GLOBAL Pre 22.59 %      PHQ-9: Recent Review Flowsheet Data    Depression screen [facility] 2/9 10/18/2015   Decreased Interest 0   Down, Depressed, Hopeless 0   PHQ - 2 Score 0   Altered sleeping 1   Tired, decreased energy 0   Change in appetite 1   Feeling bad or failure about yourself  0   Trouble concentrating  0   Moving slowly or fidgety/restless 0   Suicidal thoughts 0   PHQ-9 Score 2   Difficult doing work/chores Somewhat difficult      Psychosocial Evaluation and Intervention:     Psychosocial Evaluation - 10/18/15 1702      Psychosocial Evaluation & Interventions   Interventions Encouraged to exercise with the program and follow exercise prescription   Continued Psychosocial Services Needed No      Psychosocial Re-Evaluation:   Vocational Rehabilitation: Provide vocational rehab assistance to qualifying candidates.   Vocational Rehab Evaluation & Intervention:     Vocational Rehab - 10/18/15 1657      Initial Vocational Rehab Evaluation & Intervention   Assessment shows need for Vocational Rehabilitation No      Education: Education Goals: Education classes will be provided on a weekly basis, covering required topics. Participant will state understanding/return demonstration of topics presented.  Learning Barriers/Preferences:     Learning Barriers/Preferences - 10/18/15 1647      Learning Barriers/Preferences   Learning Barriers None   Learning Preferences Video;Verbal  Instruction;Pictoral;Written Material      Education Topics: Hypertension, Hypertension Reduction -Define heart disease and high blood pressure. Discus how high blood pressure affects the body and ways to reduce high blood pressure.   Exercise and Your Heart -Discuss why it is important to exercise, the FITT principles of exercise, normal and abnormal responses to exercise, and how to exercise safely.   Angina -Discuss definition of angina, causes of angina, treatment of angina, and how to decrease risk of having angina.   Cardiac Medications -Review what the following cardiac medications are used for, how they affect the body, and side effects that may occur when taking the medications.  Medications include Aspirin, Beta blockers, calcium channel blockers, ACE Inhibitors, angiotensin receptor blockers, diuretics, digoxin, and antihyperlipidemics.   Congestive Heart Failure -Discuss the definition of CHF, how to live with CHF, the signs and symptoms of CHF, and how keep track of weight and sodium intake.   Heart Disease and Intimacy -Discus the effect sexual activity has on the heart, how changes occur during intimacy as we age, and safety during sexual activity.   Smoking Cessation / COPD -Discuss different methods to quit smoking, the health benefits of quitting smoking, and the definition of COPD.   Nutrition I: Fats -Discuss the types of cholesterol, what cholesterol does to the heart, and how cholesterol levels can be controlled.   Nutrition II: Labels -Discuss the different components of food labels and how to read food label   Heart Parts and Heart Disease -Discuss the anatomy of the heart, the pathway of blood circulation through the heart, and these are affected by heart disease.   Stress I: Signs and Symptoms -Discuss the causes of stress, how stress may lead to anxiety and depression, and ways to limit stress.   Stress II: Relaxation -Discuss different  types of relaxation techniques to limit stress.   Warning Signs of Stroke / TIA -Discuss definition of a stroke, what the signs and symptoms are of a stroke, and how to identify when someone is having stroke.   Knowledge Questionnaire Score:     Knowledge Questionnaire Score - 10/18/15 1657      Knowledge Questionnaire Score   Pre Score 20/24      Core Components/Risk Factors/Patient Goals at Admission:     Personal Goals and Risk Factors at Admission - 10/18/15 1659      Core Components/Risk Factors/Patient Goals on Admission  Weight Management Weight Maintenance  Patient's BMI WNL   Sedentary Yes   Intervention Provide advice, education, support and counseling about physical activity/exercise needs.;Develop an individualized exercise prescription for aerobic and resistive training based on initial evaluation findings, risk stratification, comorbidities and participant's personal goals.   Expected Outcomes Achievement of increased cardiorespiratory fitness and enhanced flexibility, muscular endurance and strength shown through measurements of functional capacity and personal statement of participant.   Increase Strength and Stamina Yes   Intervention Provide advice, education, support and counseling about physical activity/exercise needs.;Develop an individualized exercise prescription for aerobic and resistive training based on initial evaluation findings, risk stratification, comorbidities and participant's personal goals.   Expected Outcomes Achievement of increased cardiorespiratory fitness and enhanced flexibility, muscular endurance and strength shown through measurements of functional capacity and personal statement of participant.   Improve shortness of breath with ADL's Yes   Intervention Provide education, individualized exercise plan and daily activity instruction to help decrease symptoms of SOB with activities of daily living.   Expected Outcomes Short Term: Achieves  a reduction of symptoms when performing activities of daily living.   Personal Goal Other Yes   Personal Goal Gain strength and stamina, Do more activities w/o SOB   Intervention Attend CR 3 times week and supplement 2 days week at home.    Expected Outcomes To achieve personal goals.       Core Components/Risk Factors/Patient Goals Review:      Goals and Risk Factor Review    Row Name 10/18/15 1701             Core Components/Risk Factors/Patient Goals Review   Personal Goals Review Increase Strength and Stamina;Improve shortness of breath with ADL's          Core Components/Risk Factors/Patient Goals at Discharge (Final Review):      Goals and Risk Factor Review - 10/18/15 1701      Core Components/Risk Factors/Patient Goals Review   Personal Goals Review Increase Strength and Stamina;Improve shortness of breath with ADL's      ITP Comments:   Comments: Patient arrived for 1st visit/orientation/education at 1430. Patient was referred to CR by Dr. Ellyn Hack due to Stent placement (Z95.5).  Dr. Debara Pickett is her regular cardiologist. During orientation advised patient on arrival and appointment times what to wear, what to do before, during and after exercise. Reviewed attendance and class policy. Talked about inclement weather and class consultation policy. Pt is scheduled to return Cardiac Rehab on 11/02/15 at 0815. Pt was advised to come to class 15 minutes before class starts. Patient was also given instructions on meeting with the dietician and attending the Family Structure classes. Pt is eager to get started. Patient was able to complete 6 minute walk test. Patient was measured for the equipment. Discussed equipment safety with patient. Took patient pre-anthropometric measurements. Patient finished visit at 1630.

## 2015-10-18 NOTE — Progress Notes (Signed)
Cardiac/Pulmonary Rehab Medication Review by a Pharmacist  Does the patient  feel that his/her medications are working for him/her?  yes  Has the patient been experiencing any side effects to the medications prescribed?  No, some dizziness (likely from metoprolol) and some bruising (likely from Plavix)   Does the patient measure his/her own blood pressure or blood glucose at home?  Yes, blood pressure occasionally.  Does the patient have any problems obtaining medications due to transportation or finances?   Yes, Ranexa previously hard to obtain due to cost.  However, she is now receiving free from drug company.  Understanding of regimen: good Understanding of indications: good Potential of compliance: excellent, calendar reminder and pill box for each day.  Pharmacist comments: Good understanding and compliance.  No questions.  Pricilla Larsson 10/18/2015 3:26 PM

## 2015-11-01 ENCOUNTER — Ambulatory Visit (INDEPENDENT_AMBULATORY_CARE_PROVIDER_SITE_OTHER): Payer: Medicare Other | Admitting: Family Medicine

## 2015-11-01 ENCOUNTER — Ambulatory Visit (HOSPITAL_COMMUNITY)
Admission: RE | Admit: 2015-11-01 | Discharge: 2015-11-01 | Disposition: A | Discharge status: Home / Self Care | Payer: Medicare Other | Source: Ambulatory Visit | Attending: Family Medicine | Admitting: Family Medicine

## 2015-11-01 ENCOUNTER — Encounter: Payer: Self-pay | Admitting: Family Medicine

## 2015-11-01 VITALS — BP 144/78 | HR 66 | Temp 98.1°F | Resp 14 | Ht 67.0 in | Wt 149.0 lb

## 2015-11-01 DIAGNOSIS — Z0000 Encounter for general adult medical examination without abnormal findings: Secondary | ICD-10-CM

## 2015-11-01 DIAGNOSIS — R05 Cough: Secondary | ICD-10-CM

## 2015-11-01 DIAGNOSIS — R059 Cough, unspecified: Secondary | ICD-10-CM

## 2015-11-01 DIAGNOSIS — Z955 Presence of coronary angioplasty implant and graft: Secondary | ICD-10-CM | POA: Insufficient documentation

## 2015-11-01 DIAGNOSIS — Z1231 Encounter for screening mammogram for malignant neoplasm of breast: Secondary | ICD-10-CM | POA: Diagnosis not present

## 2015-11-01 DIAGNOSIS — Z23 Encounter for immunization: Secondary | ICD-10-CM | POA: Diagnosis not present

## 2015-11-01 DIAGNOSIS — Z1239 Encounter for other screening for malignant neoplasm of breast: Secondary | ICD-10-CM

## 2015-11-01 DIAGNOSIS — R0602 Shortness of breath: Secondary | ICD-10-CM | POA: Diagnosis not present

## 2015-11-01 DIAGNOSIS — I2510 Atherosclerotic heart disease of native coronary artery without angina pectoris: Secondary | ICD-10-CM | POA: Diagnosis not present

## 2015-11-01 MED ORDER — FLUTICASONE PROPIONATE 50 MCG/ACT NA SUSP: NASAL | 1 refills | Status: DC

## 2015-11-01 NOTE — Progress Notes (Signed)
Subjective:    Patient ID: Lauren House, female    DOB: Nov 20, 1935, 80 y.o.   MRN: [id number]  HPI Here today for complete physical exam. Past medical history is significant for hyperlipidemia as well as coronary artery disease. Patient is intolerant to all statins. Her last documented LDL cholesterol 166 and 2014. 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  I saw the patient earlier this month for bronchitis. She continues to have a cough. The cough is worse at night whenever she lies down. She also reports postnasal drip making me concerned that this is what is triggering the cough when she lies down. She is not currently taking Flonase but would like to try this. However, her exam today is significant for left posterior lower crackles. She did have atelectasis on chest x-ray in May that may explain the. However given her persisting cough, I think we need to evaluate this further. She is also due for mammogram. She refuses a colonoscopy given her age and her heart problems. She has had a bone density one time after the age of 28. Given her history of a hysterectomy, she no longer requires Pap smear. Immunizations are up-to-date except for the shingles vaccine which she refuses, a flu shot, and Prevnar 13 Past Medical History:  Diagnosis Date  . Anxiety   . Apical variant hypertrophic cardiomyopathy ([place])   . Arthritis   . Borderline diabetes mellitus   . Bruises easily   . Bursitis of left hip   . Coronary atherosclerosis of native coronary artery    a. 2011: Cath showing nonobstructive CAD - 50-60% LAD (normal FFR) b. 06/2015: Cath showing 80% stenosis in the Mid LAD, tx w/ Promus DES 2.25 mm x 16 mm   . Essential hypertension, benign   . History of kidney stones   . History of skin cancer    Basal cell - abdomen  . Hyperlipidemia    Diet controlled.cannot take meds  . Nocturia   . Osteoporosis   . Overactive bladder   . Shortness of breath dyspnea   . Vertigo    Past Surgical History:    Procedure Laterality Date  . ABDOMINAL HYSTERECTOMY    . CARDIAC CATHETERIZATION N/A 07/22/2015   Procedure: Left Heart Cath and Coronary Angiography;  Surgeon: Leonie Man, MD;  Location: [facility];  Service: Cardiovascular;  Laterality: N/A;  . CARDIAC CATHETERIZATION N/A 07/22/2015   Procedure: Coronary Stent Intervention;  Surgeon: Leonie Man, MD;  Location: [facility];  Service: Cardiovascular;  Laterality: N/A;  . CORONARY STENT PLACEMENT  07/22/2015   mid lad  des   . EYE SURGERY     cataracts bilateral  . HEMORRHOID SURGERY    . INGUINAL HERNIA REPAIR  10/11/2011   Procedure: HERNIA REPAIR INGUINAL ADULT;  Surgeon: Stark Klein, MD;  Location: [facility];  Service: General;  Laterality: Right;  . OPEN SURGICAL REPAIR OF GLUTEAL TENDON Left 03/12/2014   Procedure: LEFT HIP BURSECTOMY AND GLUTEAL TENDON REPAIR;  Surgeon: [name], MD;  Location: [facility];  Service: Orthopedics;  Laterality: Left;   Current Outpatient Prescriptions on File Prior to Visit  Medication Sig Dispense Refill  . aspirin EC 81 MG tablet Take 81 mg by mouth daily.    . clopidogrel (PLAVIX) 75 MG tablet Take 75 mg by mouth daily.    . Cyanocobalamin (VITAMIN B12 PO) Take 1 tablet by mouth daily.    . metoprolol tartrate (LOPRESSOR) 25 MG  tablet Take 0.5 tablets (12.5 mg total) by mouth 2 (two) times daily. 30 tablet 11  . Multiple Vitamins-Minerals (OCUVITE PO) Take 1 tablet by mouth daily.    . nitroGLYCERIN (NITROSTAT) 0.4 MG SL tablet Place 1 tablet (0.4 mg total) under the tongue every 5 (five) minutes as needed for chest pain. 25 tablet 3  . Polyethyl Glycol-Propyl Glycol (SYSTANE OP) Apply 1 drop to eye 3 (three) times daily as needed (Dry eyes).    . ranolazine (RANEXA) 500 MG 12 hr tablet Take 1 tablet (500 mg total) by mouth 2 (two) times daily. 180 tablet 3   No current facility-administered medications on file prior to visit.    Allergies  Allergen Reactions  .  Chlorhexidine Itching  . Vancomycin Hives  . Aspirin Other (See Comments)    Nose bleeds with full strength  . Atorvastatin Other (See Comments)    Muscle pain  . Bactrim [Sulfamethoxazole-Trimethoprim] Other (See Comments)    Doesn't remember   . Cephalosporins Hives and Itching  . Ciprofloxacin Nausea Only  . Clarithromycin Other (See Comments)    Doesn't remember   . Colestipol Hcl Other (See Comments)    Hallucinations  . Ezetimibe Other (See Comments)    GI upset  . Simvastatin Other (See Comments)    Muscle spasms  . Penicillins Rash    Has patient had a PCN reaction causing immediate rash, facial/tongue/throat swelling, SOB or lightheadedness with hypotension: yes Has patient had a PCN reaction causing severe rash involving mucus membranes or skin necrosis: no Has patient had a PCN reaction that required hospitalization no Has patient had a PCN reaction occurring within the last 10 years: no If all of the above answers are "NO", then may proceed with Cephalosporin use.   . Tape Rash   Social History   Social History  . Marital status: Widowed    Spouse name: N/A  . Number of children: N/A  . Years of education: N/A   Occupational History  . Not on file.   Social History Main Topics  . Smoking status: Never Smoker  . Smokeless tobacco: Never Used  . Alcohol use No  . Drug use: No  . Sexual activity: No   Other Topics Concern  . Not on file   Social History Narrative  . No narrative on file   Family History  Problem Relation Age of Onset  . Heart disease Father   . Lung cancer Sister   . Lung cancer Brother       Review of Systems  All other systems reviewed and are negative.      Objective:   Physical Exam  Constitutional: She is oriented to person, place, and time. She appears well-developed and well-nourished. No distress.  HENT:  Head: Normocephalic and atraumatic.  Right Ear: External ear normal.  Left Ear: External ear normal.  Nose:  Nose normal.  Mouth/Throat: Oropharynx is clear and moist. No oropharyngeal exudate.  Eyes: Conjunctivae and EOM are normal. Pupils are equal, round, and reactive to light. Right eye exhibits no discharge. Left eye exhibits no discharge. No scleral icterus.  Neck: Normal range of motion. Neck supple. No JVD present. No tracheal deviation present. No thyromegaly present.  Cardiovascular: Normal rate, regular rhythm, normal heart sounds and intact distal pulses.  Exam reveals no gallop and no friction rub.   No murmur heard. Pulmonary/Chest: Effort normal. No stridor. No respiratory distress. She has no wheezes. She has rales. She exhibits no tenderness.  Abdominal: Soft. Bowel sounds are normal. She exhibits no distension and no mass. There is no tenderness. There is no rebound and no guarding.  Musculoskeletal: Normal range of motion. She exhibits no edema, tenderness or deformity.  Lymphadenopathy:    She has no cervical adenopathy.  Neurological: She is alert and oriented to person, place, and time. She has normal reflexes. She displays normal reflexes. No cranial nerve deficit. She exhibits normal muscle tone. Coordination normal.  Skin: Skin is warm. No rash noted. She is not diaphoretic. No erythema. No pallor.  Psychiatric: She has a normal mood and affect. Her behavior is normal. Judgment and thought content normal.  Vitals reviewed.         Assessment & Plan:  Cough - Plan: DG Chest 2 View  Routine general medical examination at a health care facility  ASCVD (arteriosclerotic cardiovascular disease) - Plan: Lipid panel  I believe her cough is due to postnasal drip. I would like her to start Flonase. I believe the crackles I'm hearing in the left lower lobe are likely due to atelectasis. However I will obtain a chest x-ray. She declines a colonoscopy. She now requires a Pap smear. I will schedule her for mammogram. Her bone density is up-to-date. She declines the shingles vaccine.  She received Prevnar 13 today along with her flu shot. I would like her to return for fasting lipid panel and consider Praluent if ldl is > 70.

## 2015-11-02 ENCOUNTER — Encounter (HOSPITAL_COMMUNITY)
Admission: RE | Admit: 2015-11-02 | Discharge: 2015-11-02 | Disposition: A | Discharge status: Home / Self Care | Payer: Medicare Other | Source: Ambulatory Visit | Attending: Internal Medicine | Admitting: Internal Medicine

## 2015-11-02 ENCOUNTER — Other Ambulatory Visit: Payer: Self-pay | Admitting: Family Medicine

## 2015-11-02 ENCOUNTER — Ambulatory Visit (HOSPITAL_COMMUNITY)
Admission: RE | Admit: 2015-11-02 | Discharge: 2015-11-02 | Disposition: A | Discharge status: Home / Self Care | Payer: Medicare Other | Source: Ambulatory Visit | Attending: Family Medicine | Admitting: Family Medicine

## 2015-11-02 DIAGNOSIS — Z23 Encounter for immunization: Secondary | ICD-10-CM | POA: Diagnosis not present

## 2015-11-02 DIAGNOSIS — Z1231 Encounter for screening mammogram for malignant neoplasm of breast: Secondary | ICD-10-CM

## 2015-11-02 DIAGNOSIS — Z955 Presence of coronary angioplasty implant and graft: Secondary | ICD-10-CM

## 2015-11-02 DIAGNOSIS — R928 Other abnormal and inconclusive findings on diagnostic imaging of breast: Secondary | ICD-10-CM | POA: Diagnosis not present

## 2015-11-02 DIAGNOSIS — I2510 Atherosclerotic heart disease of native coronary artery without angina pectoris: Secondary | ICD-10-CM | POA: Diagnosis not present

## 2015-11-02 DIAGNOSIS — Z0000 Encounter for general adult medical examination without abnormal findings: Secondary | ICD-10-CM | POA: Diagnosis not present

## 2015-11-02 NOTE — Addendum Note (Signed)
Addended by: Shary Decamp B on: 11/02/2015 10:46 AM   Modules accepted: Orders

## 2015-11-02 NOTE — Progress Notes (Signed)
Daily Session Note  Patient Details  Name: Lauren House MRN: [id number] Date of Birth: 03-15-1935 Referring Provider:   Flowsheet Row CARDIAC REHAB PHASE II ORIENTATION from 10/18/2015 in [facility]  Referring Provider  Dr. Ellyn Hack       Encounter Date: 11/02/2015  Check In:     Session Check In - 11/02/15 0815      Check-In   Location [facility]   Staff Present Aundra Dubin, RN, BSN;Honestii Marton Luther Parody, BS, EP, Exercise Physiologist   Supervising physician immediately available to respond to emergencies See telemetry face sheet for immediately available MD   Medication changes reported     No   Fall or balance concerns reported    No   Warm-up and Cool-down Performed as group-led instruction   Resistance Training Performed Yes   VAD Patient? No     Pain Assessment   Currently in Pain? No/denies   Pain Score 0-No pain   Multiple Pain Sites No      Capillary Blood Glucose: No results found for this or any previous visit (from the past 24 hour(s)).   Goals Met:  Independence with exercise equipment Exercise tolerated well No report of cardiac concerns or symptoms Strength training completed today  Goals Unmet:  Not Applicable  Comments: Check out 915   Dr. Kate Sable is Medical Director for [place] and [facility].

## 2015-11-03 ENCOUNTER — Other Ambulatory Visit: Payer: Medicare Other

## 2015-11-03 DIAGNOSIS — I2510 Atherosclerotic heart disease of native coronary artery without angina pectoris: Secondary | ICD-10-CM | POA: Diagnosis not present

## 2015-11-03 LAB — LIPID PANEL
CHOL/HDL RATIO: 5.8 ratio — AB (ref <=5.0)
Cholesterol: 279 mg/dL — ABNORMAL HIGH (ref 125–200)
HDL: 48 mg/dL (ref >=46)
LDL Cholesterol: 188 mg/dL — ABNORMAL HIGH (ref <130)
Triglycerides: 216 mg/dL — ABNORMAL HIGH (ref <150)
VLDL: 43 mg/dL — AB (ref <30)

## 2015-11-04 ENCOUNTER — Encounter (HOSPITAL_COMMUNITY)
Admission: RE | Admit: 2015-11-04 | Discharge: 2015-11-04 | Disposition: A | Discharge status: Home / Self Care | Payer: Medicare Other | Source: Ambulatory Visit | Attending: Internal Medicine | Admitting: Internal Medicine

## 2015-11-04 ENCOUNTER — Other Ambulatory Visit: Payer: Self-pay | Admitting: Family Medicine

## 2015-11-04 DIAGNOSIS — Z955 Presence of coronary angioplasty implant and graft: Secondary | ICD-10-CM | POA: Diagnosis not present

## 2015-11-04 DIAGNOSIS — R928 Other abnormal and inconclusive findings on diagnostic imaging of breast: Secondary | ICD-10-CM

## 2015-11-04 NOTE — Progress Notes (Signed)
Daily Session Note  Patient Details  Name: Lauren House MRN: [id number] Date of Birth: 07-Feb-1936 Referring Provider:   Flowsheet Row CARDIAC REHAB PHASE II ORIENTATION from 10/18/2015 in [facility]  Referring Provider  Dr. Ellyn Hack       Encounter Date: 11/04/2015  Check In:     Session Check In - 11/04/15 0815      Check-In   Location [facility]   Staff Present Aundra Dubin, RN, BSN;Nakea Gouger Luther Parody, BS, EP, Exercise Physiologist   Supervising physician immediately available to respond to emergencies See telemetry face sheet for immediately available MD   Medication changes reported     No   Fall or balance concerns reported    No   Warm-up and Cool-down Performed as group-led instruction   Resistance Training Performed Yes   VAD Patient? No     Pain Assessment   Currently in Pain? No/denies   Pain Score 0-No pain   Multiple Pain Sites No      Capillary Blood Glucose: No results found for this or any previous visit (from the past 24 hour(s)).   Goals Met:  Independence with exercise equipment Exercise tolerated well No report of cardiac concerns or symptoms Strength training completed today  Goals Unmet:  Not Applicable  Comments: Check out 915   Dr. Kate Sable is Medical Director for [facility] and [facility].

## 2015-11-07 ENCOUNTER — Encounter (HOSPITAL_COMMUNITY)
Admission: RE | Admit: 2015-11-07 | Discharge: 2015-11-07 | Disposition: A | Discharge status: Home / Self Care | Payer: Medicare Other | Source: Ambulatory Visit | Attending: Internal Medicine | Admitting: Internal Medicine

## 2015-11-07 DIAGNOSIS — Z955 Presence of coronary angioplasty implant and graft: Secondary | ICD-10-CM | POA: Diagnosis not present

## 2015-11-07 NOTE — Progress Notes (Signed)
Daily Session Note  Patient Details  Name: Lauren House MRN: [id number] Date of Birth: 04/17/1935 Referring Provider:   Flowsheet Row CARDIAC REHAB PHASE II ORIENTATION from 10/18/2015 in [facility]  Referring Provider  Dr. Harding       Encounter Date: 11/07/2015  Check In:     Session Check In - 11/07/15 0815      Check-In   Location [facility]   Staff Present Debra Johnson, RN, BSN;Diane Coad, MS, EP, [facility], Exercise Physiologist;Mykle Pascua, BS, EP, Exercise Physiologist   Supervising physician immediately available to respond to emergencies See telemetry face sheet for immediately available MD   Medication changes reported     No   Fall or balance concerns reported    No   Warm-up and Cool-down Performed as group-led instruction   Resistance Training Performed Yes   VAD Patient? No     Pain Assessment   Currently in Pain? No/denies   Pain Score 0-No pain   Multiple Pain Sites No      Capillary Blood Glucose: No results found for this or any previous visit (from the past 24 hour(s)).   Goals Met:  Independence with exercise equipment Exercise tolerated well No report of cardiac concerns or symptoms Strength training completed today  Goals Unmet:  Not Applicable  Comments: Check out 915   Dr. Suresh Koneswaran is Medical Director for [facility]. 

## 2015-11-09 ENCOUNTER — Encounter (HOSPITAL_COMMUNITY)
Admission: RE | Admit: 2015-11-09 | Discharge: 2015-11-09 | Disposition: A | Discharge status: Home / Self Care | Payer: Medicare Other | Source: Ambulatory Visit | Attending: Internal Medicine | Admitting: Internal Medicine

## 2015-11-09 DIAGNOSIS — Z955 Presence of coronary angioplasty implant and graft: Secondary | ICD-10-CM | POA: Diagnosis not present

## 2015-11-09 NOTE — Progress Notes (Signed)
Daily Session Note  Patient Details  Name: Lauren House MRN: [id number] Date of Birth: October 21, 1935 Referring Provider:   Flowsheet Row CARDIAC REHAB PHASE II ORIENTATION from 10/18/2015 in [facility]  Referring Provider  Dr. Ellyn Hack       Encounter Date: 11/09/2015  Check In:     Session Check In - 11/09/15 0815      Check-In   Location [facility]   Staff Present Russella Dar, MS, EP, [facility], Exercise Physiologist;Alfredia Desanctis Luther Parody, BS, EP, Exercise Physiologist   Supervising physician immediately available to respond to emergencies See telemetry face sheet for immediately available MD   Medication changes reported     No   Fall or balance concerns reported    No   Warm-up and Cool-down Performed as group-led instruction   Resistance Training Performed Yes   VAD Patient? No     Pain Assessment   Currently in Pain? No/denies   Pain Score 0-No pain   Multiple Pain Sites No      Capillary Blood Glucose: No results found for this or any previous visit (from the past 24 hour(s)).      Exercise Prescription Changes - 11/08/15 1500      Exercise Review   Progression Yes     Response to Exercise   Blood Pressure (Admit) 134/68   Blood Pressure (Exercise) 144/74   Blood Pressure (Exit) 122/68   Heart Rate (Admit) 73 bpm   Heart Rate (Exercise) 80 bpm   Heart Rate (Exit) 74 bpm   Rating of Perceived Exertion (Exercise) 10   Duration Progress to 30 minutes of continuous aerobic without signs/symptoms of physical distress   Intensity Rest + 30     Progression   Progression Continue progressive overload as per policy without signs/symptoms or physical distress.     Resistance Training   Training Prescription Yes   Weight 1   Reps 10-12     NuStep   Level 2   Watts 18   Minutes 15   METs 3.43     Arm Ergometer   Level 2   Watts 19   Minutes 20   METs 2.4     Home Exercise Plan   Plans to continue exercise at Home   Frequency Add 2 additional days to program exercise sessions.     Goals Met:  Independence with exercise equipment Exercise tolerated well No report of cardiac concerns or symptoms Strength training completed today  Goals Unmet:  Not Applicable  Comments: Check out 915   Dr. Kate Sable is Medical Director for [facility] and [facility].

## 2015-11-11 ENCOUNTER — Encounter (HOSPITAL_COMMUNITY)
Admission: RE | Admit: 2015-11-11 | Discharge: 2015-11-11 | Disposition: A | Discharge status: Home / Self Care | Payer: Medicare Other | Source: Ambulatory Visit | Attending: Internal Medicine | Admitting: Internal Medicine

## 2015-11-11 DIAGNOSIS — Z955 Presence of coronary angioplasty implant and graft: Secondary | ICD-10-CM | POA: Diagnosis not present

## 2015-11-11 NOTE — Progress Notes (Signed)
Daily Session Note  Patient Details  Name: Lauren House MRN: [id number] Date of Birth: 10/25/1935 Referring Provider:   Flowsheet Row CARDIAC REHAB PHASE II ORIENTATION from 10/18/2015 in [facility]  Referring Provider  Dr. Harding       Encounter Date: 11/11/2015  Check In:     Session Check In - 11/11/15 0815      Check-In   Location [facility]   Staff Present Diane Coad, MS, EP, [facility], Exercise Physiologist;Gregory Cowan, BS, EP, Exercise Physiologist;Debra Johnson, RN, BSN   Supervising physician immediately available to respond to emergencies See telemetry face sheet for immediately available MD   Medication changes reported     No   Fall or balance concerns reported    No   Warm-up and Cool-down Performed as group-led instruction   Resistance Training Performed Yes   VAD Patient? No     Pain Assessment   Pain Score 0-No pain   Multiple Pain Sites No      Capillary Blood Glucose: No results found for this or any previous visit (from the past 24 hour(s)).   Goals Met:  Independence with exercise equipment Exercise tolerated well No report of cardiac concerns or symptoms Strength training completed today  Goals Unmet:  Not Applicable  Comments: Check out 0915   Dr. Suresh Koneswaran is Medical Director for [facility]. 

## 2015-11-11 NOTE — Progress Notes (Signed)
Cardiac Individual Treatment Plan  Patient Details  Name: Lauren House MRN: [id number] Date of Birth: August 02, 1935 Referring Provider:   Flowsheet Row CARDIAC REHAB PHASE II ORIENTATION from 10/18/2015 in [place]  Referring Provider  Dr. Ellyn Hack       Initial Encounter Date:  Flowsheet Row CARDIAC REHAB PHASE II ORIENTATION from 10/18/2015 in [place]  Date  10/18/15  Referring Provider  Dr. Ellyn Hack       Visit Diagnosis: Stented coronary artery  Patient's Home Medications on Admission:  Current Outpatient Prescriptions:  .  aspirin EC 81 MG tablet, Take 81 mg by mouth daily., Disp: , Rfl:  .  clopidogrel (PLAVIX) 75 MG tablet, Take 75 mg by mouth daily., Disp: , Rfl:  .  Cyanocobalamin (VITAMIN B12 PO), Take 1 tablet by mouth daily., Disp: , Rfl:  .  fluticasone (FLONASE) 50 MCG/ACT nasal spray, USE TWO SPRAY IN EACH NOSTRIL EVERY DAY, Disp: 50 g, Rfl: 1 .  metoprolol tartrate (LOPRESSOR) 25 MG tablet, Take 0.5 tablets (12.5 mg total) by mouth 2 (two) times daily., Disp: 30 tablet, Rfl: 11 .  Multiple Vitamins-Minerals (OCUVITE PO), Take 1 tablet by mouth daily., Disp: , Rfl:  .  nitroGLYCERIN (NITROSTAT) 0.4 MG SL tablet, Place 1 tablet (0.4 mg total) under the tongue every 5 (five) minutes as needed for chest pain., Disp: 25 tablet, Rfl: 3 .  Polyethyl Glycol-Propyl Glycol (SYSTANE OP), Apply 1 drop to eye 3 (three) times daily as needed (Dry eyes)., Disp: , Rfl:  .  ranolazine (RANEXA) 500 MG 12 hr tablet, Take 1 tablet (500 mg total) by mouth 2 (two) times daily., Disp: 180 tablet, Rfl: 3  Past Medical History: Past Medical History:  Diagnosis Date  . Anxiety   . Apical variant hypertrophic cardiomyopathy ([place])   . Arthritis   . Borderline diabetes mellitus   . Bruises easily   . Bursitis of left hip   . Coronary atherosclerosis of native coronary artery    a. 2011: Cath showing nonobstructive CAD - 50-60% LAD (normal  FFR) b. 06/2015: Cath showing 80% stenosis in the Mid LAD, tx w/ Promus DES 2.25 mm x 16 mm   . Essential hypertension, benign   . History of kidney stones   . History of skin cancer    Basal cell - abdomen  . Hyperlipidemia    Diet controlled.cannot take meds  . Nocturia   . Osteoporosis   . Overactive bladder   . Shortness of breath dyspnea   . Vertigo     Tobacco Use: History  Smoking Status  . Never Smoker  Smokeless Tobacco  . Never Used    Labs: Recent Review Flowsheet Data    Labs for ITP Cardiac and Pulmonary Rehab Latest Ref Rng & Units 08/20/2005 09/02/2008 02/10/2010 12/23/2012 11/03/2015   Cholestrol 125 - 200 mg/dL 297 267(H) 249 ATP III CLASSIFICATION: <200     mg/dL   Desirable 200-239  mg/dL   Borderline High >=240    mg/dL   High(H) 287(H) 279(H)   LDLCALC <130 mg/dL 207 177(H) 174 Total Cholesterol/HDL:CHD Risk Coronary Heart Disease Risk Table Men   Women 1/2 Average Risk   3.4   3.3 Average Risk       5.0   4.4 2 X Average Risk   9.6   7.1 3 X Average Risk  23.4   11.0 Use the calculated Patient Ratio above and the CHD Risk Table to determine the patient's  CHD Risk. ATP III CLASSIFICATION (LDL): <100     mg/dL   Optimal 100-129  mg/dL   Near or Above Optimal 130-159  mg/dL   Borderline 160-189  mg/dL   High >190     mg/dL   Very High(H) 166(H) 188(H)   HDL >=46 mg/dL 46 51 36(L) 47 48   Trlycerides <150 mg/dL 222 193(H) 196(H) 368(H) 216(H)   Hemoglobin A1c <5.7 % - - 6.3 (NOTE)                                                                       According to the ADA Clinical Practice Recommendations for 2011, when HbA1c is used as a screening test:   >=6.5%   Diagnostic of Diabetes Mellitus           (if abnormal result is confirmed)  5.7-6.4%   Increased risk of developing Diabetes Mellitus  References:Diagnosis and Classification of Diabetes Mellitus,Diabetes [id number] 1):S62-S69 and Standards of Medical Care in         Diabetes -  2011,Diabetes [id number] (Suppl 1):S11-S61.(H) - -      Capillary Blood Glucose: Lab Results  Component Value Date   GLUCAP 93 10/11/2011   GLUCAP 109 (H) 10/11/2011   GLUCAP 107 (H) 02/10/2010     Exercise Target Goals:    Exercise Program Goal: Individual exercise prescription set with THRR, safety & activity barriers. Participant demonstrates ability to understand and report RPE using BORG scale, to self-measure pulse accurately, and to acknowledge the importance of the exercise prescription.  Exercise Prescription Goal: Starting with aerobic activity 30 plus minutes a day, 3 days per week for initial exercise prescription. Provide home exercise prescription and guidelines that participant acknowledges understanding prior to discharge.  Activity Barriers & Risk Stratification:     Activity Barriers & Cardiac Risk Stratification - 10/18/15 1645      Activity Barriers & Cardiac Risk Stratification   Activity Barriers Joint Problems  due to hip tendon surgery it would be difficult to walk on TM.    Cardiac Risk Stratification High      6 Minute Walk:     6 Minute Walk    Row Name 10/19/15 1407 10/19/15 1408       6 Minute Walk   Phase Initial Initial    Distance  - 1200 feet    Walk Time  - 6 minutes    # of Rest Breaks  - 0    MPH  - 2.27    METS  - 2.74    RPE  - 11    Perceived Dyspnea   - 9    VO2 Peak  - 7.4    Symptoms  - No    Resting HR  - 66 bpm    Resting BP  - 132/72    Max Ex. HR  - 89 bpm    Max Ex. BP  - 174/72    2 Minute Post BP  - 140/74       Initial Exercise Prescription:     Initial Exercise Prescription - 10/19/15 1400      Date of Initial Exercise RX and Referring Provider   Date 10/18/15   Referring Provider Dr. Ellyn Hack  NuStep   Level 2   Watts 15   Minutes 15   METs 1.9     Arm Ergometer   Level 2   Watts 15   Minutes 20   METs 1.9     Prescription Details   Frequency (times per week) 3   Duration  Progress to 30 minutes of continuous aerobic without signs/symptoms of physical distress     Intensity   THRR REST +  30   THRR 40-80% of Max Heartrate 96-112-127   Ratings of Perceived Exertion 11-13   Perceived Dyspnea 0-4     Progression   Progression Continue progressive overload as per policy without signs/symptoms or physical distress.     Resistance Training   Training Prescription Yes   Weight 1   Reps 10-12      Perform Capillary Blood Glucose checks as needed.  Exercise Prescription Changes:      Exercise Prescription Changes    Row Name 11/08/15 1500             Exercise Review   Progression Yes         Response to Exercise   Blood Pressure (Admit) 134/68       Blood Pressure (Exercise) 144/74       Blood Pressure (Exit) 122/68       Heart Rate (Admit) 73 bpm       Heart Rate (Exercise) 80 bpm       Heart Rate (Exit) 74 bpm       Rating of Perceived Exertion (Exercise) 10       Duration Progress to 30 minutes of continuous aerobic without signs/symptoms of physical distress       Intensity Rest + 30         Progression   Progression Continue progressive overload as per policy without signs/symptoms or physical distress.         Resistance Training   Training Prescription Yes       Weight 1       Reps 10-12         NuStep   Level 2       Watts 18       Minutes 15       METs 3.43         Arm Ergometer   Level 2       Watts 19       Minutes 20       METs 2.4         Home Exercise Plan   Plans to continue exercise at Home       Frequency Add 2 additional days to program exercise sessions.          Exercise Comments:      Exercise Comments    Row Name 11/08/15 1540           Exercise Comments Patient is proggressing appropriately           Discharge Exercise Prescription (Final Exercise Prescription Changes):     Exercise Prescription Changes - 11/08/15 1500      Exercise Review   Progression Yes     Response to  Exercise   Blood Pressure (Admit) 134/68   Blood Pressure (Exercise) 144/74   Blood Pressure (Exit) 122/68   Heart Rate (Admit) 73 bpm   Heart Rate (Exercise) 80 bpm   Heart Rate (Exit) 74 bpm   Rating of Perceived Exertion (Exercise) 10   Duration Progress to  30 minutes of continuous aerobic without signs/symptoms of physical distress   Intensity Rest + 30     Progression   Progression Continue progressive overload as per policy without signs/symptoms or physical distress.     Resistance Training   Training Prescription Yes   Weight 1   Reps 10-12     NuStep   Level 2   Watts 18   Minutes 15   METs 3.43     Arm Ergometer   Level 2   Watts 19   Minutes 20   METs 2.4     Home Exercise Plan   Plans to continue exercise at Home   Frequency Add 2 additional days to program exercise sessions.      Nutrition:  Target Goals: Understanding of nutrition guidelines, daily intake of sodium 1500mg , cholesterol 200mg , calories 30% from fat and 7% or less from saturated fats, daily to have 5 or more servings of fruits and vegetables.  Biometrics:     Pre Biometrics - 10/19/15 1410      Pre Biometrics   Height 5\' 7"  (1.702 m)   Weight 150 lb 5.7 oz (68.2 kg)   Waist Circumference 36.5 inches   Hip Circumference 36.5 inches   Waist to Hip Ratio 1 %   BMI (Calculated) 23.6   Triceps Skinfold 4 mm   % Body Fat 28 %   Grip Strength 54.7 kg   Flexibility 12.25 in   Single Leg Stand 20 seconds       Nutrition Therapy Plan and Nutrition Goals:   Nutrition Discharge: Rate Your Plate Scores:     Nutrition Assessments - 10/18/15 1659      MEDFICTS Scores   Pre Score 30      Nutrition Goals Re-Evaluation:   Psychosocial: Target Goals: Acknowledge presence or absence of depression, maximize coping skills, provide positive support system. Participant is able to verbalize types and ability to use techniques and skills needed for reducing stress and  depression.  Initial Review & Psychosocial Screening:     Initial Psych Review & Screening - 10/18/15 1701      Initial Review   Current issues with Current Sleep Concerns     Family Dynamics   Good Support System? Yes     Barriers   Psychosocial barriers to participate in program There are no identifiable barriers or psychosocial needs.     Screening Interventions   Interventions Encouraged to exercise      Quality of Life Scores:     Quality of Life - 10/19/15 1412      Quality of Life Scores   Health/Function Pre 18 %   Socioeconomic Pre 22.5 %   Psych/Spiritual Pre 24.58 %   Family Pre 30 %   GLOBAL Pre 22.59 %      PHQ-9: Recent Review Flowsheet Data    Depression screen [facility] 2/9 10/18/2015   Decreased Interest 0   Down, Depressed, Hopeless 0   PHQ - 2 Score 0   Altered sleeping 1   Tired, decreased energy 0   Change in appetite 1   Feeling bad or failure about yourself  0   Trouble concentrating 0   Moving slowly or fidgety/restless 0   Suicidal thoughts 0   PHQ-9 Score 2   Difficult doing work/chores Somewhat difficult      Psychosocial Evaluation and Intervention:     Psychosocial Evaluation - 10/18/15 1702      Psychosocial Evaluation & Interventions   Interventions  Encouraged to exercise with the program and follow exercise prescription   Continued Psychosocial Services Needed No      Psychosocial Re-Evaluation:     Psychosocial Re-Evaluation    [place] Name 11/11/15 1432             Psychosocial Re-Evaluation   Interventions Encouraged to attend Cardiac Rehabilitation for the exercise       Comments Patient's QOL score was 22.59 and her PHQ-9 score was 2. She is not depressed and does not need counseling.           Vocational Rehabilitation: Provide vocational rehab assistance to qualifying candidates.   Vocational Rehab Evaluation & Intervention:     Vocational Rehab - 10/18/15 1657      Initial Vocational Rehab Evaluation  & Intervention   Assessment shows need for Vocational Rehabilitation No      Education: Education Goals: Education classes will be provided on a weekly basis, covering required topics. Participant will state understanding/return demonstration of topics presented.  Learning Barriers/Preferences:     Learning Barriers/Preferences - 10/18/15 1647      Learning Barriers/Preferences   Learning Barriers None   Learning Preferences Video;Verbal Instruction;Pictoral;Written Material      Education Topics: Hypertension, Hypertension Reduction -Define heart disease and high blood pressure. Discus how high blood pressure affects the body and ways to reduce high blood pressure.   Exercise and Your Heart -Discuss why it is important to exercise, the FITT principles of exercise, normal and abnormal responses to exercise, and how to exercise safely.   Angina -Discuss definition of angina, causes of angina, treatment of angina, and how to decrease risk of having angina.   Cardiac Medications -Review what the following cardiac medications are used for, how they affect the body, and side effects that may occur when taking the medications.  Medications include Aspirin, Beta blockers, calcium channel blockers, ACE Inhibitors, angiotensin receptor blockers, diuretics, digoxin, and antihyperlipidemics.   Congestive Heart Failure -Discuss the definition of CHF, how to live with CHF, the signs and symptoms of CHF, and how keep track of weight and sodium intake.   Heart Disease and Intimacy -Discus the effect sexual activity has on the heart, how changes occur during intimacy as we age, and safety during sexual activity. Flowsheet Row CARDIAC REHAB PHASE II EXERCISE from 11/02/2015 in [place]  Date  11/02/15  Educator  DJ  Instruction Review Code  2- meets goals/outcomes      Smoking Cessation / COPD -Discuss different methods to quit smoking, the health benefits of  quitting smoking, and the definition of COPD.   Nutrition I: Fats -Discuss the types of cholesterol, what cholesterol does to the heart, and how cholesterol levels can be controlled.   Nutrition II: Labels -Discuss the different components of food labels and how to read food label   Heart Parts and Heart Disease -Discuss the anatomy of the heart, the pathway of blood circulation through the heart, and these are affected by heart disease.   Stress I: Signs and Symptoms -Discuss the causes of stress, how stress may lead to anxiety and depression, and ways to limit stress.   Stress II: Relaxation -Discuss different types of relaxation techniques to limit stress.   Warning Signs of Stroke / TIA -Discuss definition of a stroke, what the signs and symptoms are of a stroke, and how to identify when someone is having stroke.   Knowledge Questionnaire Score:     Knowledge Questionnaire Score - 10/18/15  1657      Knowledge Questionnaire Score   Pre Score 20/24      Core Components/Risk Factors/Patient Goals at Admission:     Personal Goals and Risk Factors at Admission - 10/18/15 1659      Core Components/Risk Factors/Patient Goals on Admission    Weight Management Weight Maintenance  Patient's BMI WNL   Sedentary Yes   Intervention Provide advice, education, support and counseling about physical activity/exercise needs.;Develop an individualized exercise prescription for aerobic and resistive training based on initial evaluation findings, risk stratification, comorbidities and participant's personal goals.   Expected Outcomes Achievement of increased cardiorespiratory fitness and enhanced flexibility, muscular endurance and strength shown through measurements of functional capacity and personal statement of participant.   Increase Strength and Stamina Yes   Intervention Provide advice, education, support and counseling about physical activity/exercise needs.;Develop an  individualized exercise prescription for aerobic and resistive training based on initial evaluation findings, risk stratification, comorbidities and participant's personal goals.   Expected Outcomes Achievement of increased cardiorespiratory fitness and enhanced flexibility, muscular endurance and strength shown through measurements of functional capacity and personal statement of participant.   Improve shortness of breath with ADL's Yes   Intervention Provide education, individualized exercise plan and daily activity instruction to help decrease symptoms of SOB with activities of daily living.   Expected Outcomes Short Term: Achieves a reduction of symptoms when performing activities of daily living.   Personal Goal Other Yes   Personal Goal Gain strength and stamina, Do more activities w/o SOB   Intervention Attend CR 3 times week and supplement 2 days week at home.    Expected Outcomes To achieve personal goals.       Core Components/Risk Factors/Patient Goals Review:      Goals and Risk Factor Review    Row Name 10/18/15 1701 11/11/15 1430           Core Components/Risk Factors/Patient Goals Review   Personal Goals Review Increase Strength and Stamina;Improve shortness of breath with ADL's Weight Management/Obesity;Increase Strength and Stamina;Improve shortness of breath with ADL's      Review  - After 6 sessions, patient has lost 0.8 lbs. She feels like the program has helped her with some improvement with SOB and strength.       Expected Outcomes  - Patient will continue to attend program increasing her strength and stamina and improved SOB.          Core Components/Risk Factors/Patient Goals at Discharge (Final Review):      Goals and Risk Factor Review - 11/11/15 1430      Core Components/Risk Factors/Patient Goals Review   Personal Goals Review Weight Management/Obesity;Increase Strength and Stamina;Improve shortness of breath with ADL's   Review After 6 sessions, patient  has lost 0.8 lbs. She feels like the program has helped her with some improvement with SOB and strength.    Expected Outcomes Patient will continue to attend program increasing her strength and stamina and improved SOB.       ITP Comments:   Comments: 30 Day Review: Patient doing well with program. Will continue to monitor for progress.

## 2015-11-14 ENCOUNTER — Encounter (HOSPITAL_COMMUNITY)
Admission: RE | Admit: 2015-11-14 | Discharge: 2015-11-14 | Disposition: A | Discharge status: Home / Self Care | Payer: Medicare Other | Source: Ambulatory Visit | Attending: Internal Medicine | Admitting: Internal Medicine

## 2015-11-14 DIAGNOSIS — Z955 Presence of coronary angioplasty implant and graft: Secondary | ICD-10-CM | POA: Diagnosis not present

## 2015-11-14 NOTE — Progress Notes (Signed)
Daily Session Note  Patient Details  Name: Lauren House MRN: [id number] Date of Birth: Nov 28, 1935 Referring Provider:   Flowsheet Row CARDIAC REHAB PHASE II ORIENTATION from 10/18/2015 in [place]  Referring Provider  Dr. Ellyn Hack       Encounter Date: 11/14/2015  Check In:     Session Check In - 11/14/15 0815      Check-In   Location [facility]   Staff Present Russella Dar, MS, EP, [facility], Exercise Physiologist;Debra Wynetta Emery, RN, BSN;Rayden Scheper, BS, EP, Exercise Physiologist   Supervising physician immediately available to respond to emergencies See telemetry face sheet for immediately available MD   Medication changes reported     No   Fall or balance concerns reported    No   Warm-up and Cool-down Performed as group-led instruction   Resistance Training Performed Yes   VAD Patient? No     Pain Assessment   Currently in Pain? No/denies   Pain Score 0-No pain   Multiple Pain Sites No      Capillary Blood Glucose: No results found for this or any previous visit (from the past 24 hour(s)).   Goals Met:  Independence with exercise equipment Exercise tolerated well No report of cardiac concerns or symptoms Strength training completed today  Goals Unmet:  Not Applicable  Comments: Check out 915   Dr. Kate Sable is Medical Director for [facility] and Pulmonary Rehab.

## 2015-11-15 ENCOUNTER — Ambulatory Visit (HOSPITAL_COMMUNITY)
Admission: RE | Admit: 2015-11-15 | Discharge: 2015-11-15 | Disposition: A | Discharge status: Home / Self Care | Payer: Medicare Other | Source: Ambulatory Visit | Attending: Family Medicine | Admitting: Family Medicine

## 2015-11-15 DIAGNOSIS — R928 Other abnormal and inconclusive findings on diagnostic imaging of breast: Secondary | ICD-10-CM

## 2015-11-15 DIAGNOSIS — N6489 Other specified disorders of breast: Secondary | ICD-10-CM | POA: Diagnosis not present

## 2015-11-16 ENCOUNTER — Encounter (HOSPITAL_COMMUNITY)
Admission: RE | Admit: 2015-11-16 | Discharge: 2015-11-16 | Disposition: A | Discharge status: Home / Self Care | Payer: Medicare Other | Source: Ambulatory Visit | Attending: Internal Medicine | Admitting: Internal Medicine

## 2015-11-16 DIAGNOSIS — Z955 Presence of coronary angioplasty implant and graft: Secondary | ICD-10-CM | POA: Diagnosis not present

## 2015-11-16 NOTE — Progress Notes (Signed)
Daily Session Note  Patient Details  Name: Lauren House MRN: [id number] Date of Birth: 09-Nov-1935 Referring Provider:   Flowsheet Row CARDIAC REHAB PHASE II ORIENTATION from 10/18/2015 in [place]  Referring Provider  Dr. Ellyn Hack       Encounter Date: 11/16/2015  Check In:     Session Check In - 11/16/15 0911      Check-In   Location [facility]   Staff Present Russella Dar, MS, EP, [facility], Exercise Physiologist;Yuleidy Rappleye Luther Parody, BS, EP, Exercise Physiologist   Supervising physician immediately available to respond to emergencies See telemetry face sheet for immediately available MD   Medication changes reported     No   Fall or balance concerns reported    No   Warm-up and Cool-down Performed as group-led instruction   Resistance Training Performed Yes   VAD Patient? No     Pain Assessment   Currently in Pain? No/denies   Pain Score 0-No pain   Multiple Pain Sites No      Capillary Blood Glucose: No results found for this or any previous visit (from the past 24 hour(s)).   Goals Met:  Independence with exercise equipment Exercise tolerated well No report of cardiac concerns or symptoms Strength training completed today  Goals Unmet:  Not Applicable  Comments: Check out 915   Dr. Kate Sable is Medical Director for [facility] and Pulmonary Rehab.

## 2015-11-18 ENCOUNTER — Encounter (HOSPITAL_COMMUNITY): Admission: RE | Admit: 2015-11-18 | Payer: Medicare Other | Source: Ambulatory Visit

## 2015-11-18 ENCOUNTER — Encounter: Payer: Self-pay | Admitting: Internal Medicine

## 2015-11-18 ENCOUNTER — Ambulatory Visit (INDEPENDENT_AMBULATORY_CARE_PROVIDER_SITE_OTHER): Payer: Medicare Other | Admitting: Internal Medicine

## 2015-11-18 VITALS — BP 140/80 | HR 64 | Ht 67.0 in | Wt 150.4 lb

## 2015-11-18 DIAGNOSIS — I422 Other hypertrophic cardiomyopathy: Secondary | ICD-10-CM | POA: Diagnosis not present

## 2015-11-18 DIAGNOSIS — Z9861 Coronary angioplasty status: Secondary | ICD-10-CM

## 2015-11-18 DIAGNOSIS — I2510 Atherosclerotic heart disease of native coronary artery without angina pectoris: Secondary | ICD-10-CM | POA: Diagnosis not present

## 2015-11-18 DIAGNOSIS — I10 Essential (primary) hypertension: Secondary | ICD-10-CM

## 2015-11-18 DIAGNOSIS — R0609 Other forms of dyspnea: Secondary | ICD-10-CM

## 2015-11-18 DIAGNOSIS — R0600 Dyspnea, unspecified: Secondary | ICD-10-CM

## 2015-11-18 NOTE — Progress Notes (Signed)
OFFICE NOTE  Chief Complaint:  Routine follow-up  Primary Care Physician: Odette Fraction, MD  HPI:  Lauren House is a pleasant 80 year old female I have been following for history of apical hypertrophic cardiomyopathy for which she has done well on Ranexa 500 mg daily. Recently, she was having right inguinal pain and had a hernia for which she had repair. That, unfortunately, she says has caused her more harm than good and she continues to have more pain in her right hip area which possibly is related to the hip. She saw Dr. Wynelle Link at [facility] for further evaluation from that and has received 2 hip injections which have been beneficial. He did mention that he felt she might need a hip replacement. Recently she had a recurrent episode of chest pain. This caused her to the hospital and improved with nitroglycerin. She was ruled out for MI and discharged. Unfortunately she returned several days later after she developed swelling, warmth and pain in her left forearm. It was felt that she might have possibly had an infected IV site and clearly had superficial thrombophlebitis. She was placed on antibiotics for this and is being treated with some improvement in her symptoms. After her emergency room visit, it was recommended that she increase her Ranexa 500 mg twice daily which is the recommended starting dose. She's had no further chest pain episodes. She had prior cardiac catheterization in 2011 which demonstrated a 50-60% LAD lesion. She underwent FFR for this which was negative with an FFR of 0.99. She had had a stress test at that time which indicated no evidence of reversible ischemia., but was having persistent chest pain.  It is been felt that her chest pain was due to small vessel disease in the setting of significant apical variant hypertrophic cardiomyopathy. This has been treated most effectively with ranolazine, and nitrates to some extent. Although her symptoms now have  escalated. This is concerning to me for possible worsening of her LAD disease.  Lauren House underwent a nuclear stress test on 02/03/2013. This was negative for ischemia with an EF of 52%. Since then she has reported some improvement in her symptoms. Her main complaints now have to do with left hip pain.  She is still contemplating surgery. She reported that her husband was recently diagnosed with esophageal cancer and it seems to be quite advanced. There did not appear to be any surgical options. This has caused her significant anxiety but she reports no chest pain.  [name] returns today for follow-up. Unfortunately she mentioned that her husband died about 3 months ago. He was diagnosed with esophageal cancer which was aggressive and inoperable. She is still grieving which is to be expected. She continues to have problems with her hip and at this point apparently will need hip surgery, but probably not hip replacement. That is scheduled about one month from now. She does have a history of moderate LAD stenosis in 2011 with a negative stress test one year ago. Recently, she has had worsening shortness of breath and chest pressure with exertion. She initially felt this was due to stress however her symptoms have been somewhat progressive and concern me for ischemia. Her EKG shows stable T-wave inversions consistent with her hypertrophic cardiomyopathy.  I saw Lauren House back in the office today. Overall she feels like she is doing well. She did have some mild chest discomfort last week and took a nitroglycerin. This is when she was in [place] with her son and  daughter-in-law and they were walking all over town. She managed to walk for several hours without any significant shortness of breath but did have some mild chest discomfort which was relieved fairly quickly with nitroglycerin. Overall she is doing well. She had her hip replacement on the left side which she is recovering from. She still has discomfort  when sitting in a car for long periods of time. She tells me that she's given a be going up to [place] fairly shortly on a vacation and that they will be going up to [facility] for the first time.'  Lauren House returns today for follow-up. She tells me on Saturday night she was awakened early in the morning with palpitations and a warm feeling as well as heaviness in her chest. She says it was quite intense. She denies any vivid dreams prior to that. She's not had any further episodes of chest pain since that or recurrent palpitations. She took a nitroglycerin and the symptoms resolved fairly promptly. She was able to go back to bed. Her EKGs is unchanged showing the T-wave inversions typical of apical variant hypertrophic cardiomyopathy. Her last stress test was in 2014 it was negative for ischemia. She does have a known 50-60% LAD stenosis which was negative by FFR.   07/12/2015  Lauren House was seen today in the office for follow-up. Recently she went to vacation with her family and they noticed more progressive shortness of breath and chest pressure. This was keeping her from doing certain activities. She says her symptoms of gotten worse since her last office visit in December. At that time I performed a nuclear stress test which showed a low normal EF 51% but no reversible ischemia. She does have known 50-60% LAD stenosis which was negative by FFR by catheterization in 2012.  08/26/2015  Lauren House returns today for follow-up. She underwent cardiac catheterization for progressive chest pain concerning for unstable angina on 07/22/2015 by Dr. Ellyn Hack, this demonstrated the following:  1. Mid LAD lesion, 80% stenosed. Post intervention with Promus DES 2.25 mm x 16 mm (2.4 mm), there is a 0% residual stenosis. 2. Ost LAD lesion, 50% stenosed. stable from original catheterization 3. The left ventricular systolic function is normal. with minimal apical filling consistent with known apical  hypertrophy. 4. Otherwise mild to moderate disease in the circumflex and RCA system  Since her coronary angiography and stent placement, she reports she is doing fairly well. She started back doing some exercise and push mowed her lawn the other day. Unfortunate she developed a cracked tooth and she's been having problems with pain. She took antibiotics including doxycycline which causes her stomach upset. She has a temporary crown and will be going back for some more dental work. On questioning today she reports that she has discontinued her Plavix. She said she's been off of it for about one week because of spontaneous bruising. She was not counseled to discontinue this and I reiterated the fact today that being off of dual antiplatelet therapy could put her at risk for acute in-stent thrombosis which could lead to massive heart attack.  11/18/2015  Mrs. Hilmer was seen today in follow-up. She has been undergoing cardiac rehabilitation. She restart her Plavix which is very important given her recent stent in May of this past year. She denies any chest pain but still gets short of breath. She says is fairly stable but not necessarily getting worse. She is now gone through 8 sessions rehabilitation and is up to  3.4 metabolic equivalents. Blood pressures been fairly well-controlled.  PMHx:  Past Medical History:  Diagnosis Date  . Anxiety   . Apical variant hypertrophic cardiomyopathy ([place])   . Arthritis   . Borderline diabetes mellitus   . Bruises easily   . Bursitis of left hip   . Coronary atherosclerosis of native coronary artery    a. 2011: Cath showing nonobstructive CAD - 50-60% LAD (normal FFR) b. 06/2015: Cath showing 80% stenosis in the Mid LAD, tx w/ Promus DES 2.25 mm x 16 mm   . Essential hypertension, benign   . History of kidney stones   . History of skin cancer    Basal cell - abdomen  . Hyperlipidemia    Diet controlled.cannot take meds  . Nocturia   . Osteoporosis   .  Overactive bladder   . Shortness of breath dyspnea   . Vertigo     Past Surgical History:  Procedure Laterality Date  . ABDOMINAL HYSTERECTOMY    . CARDIAC CATHETERIZATION N/A 07/22/2015   Procedure: Left Heart Cath and Coronary Angiography;  Surgeon: Leonie Man, MD;  Location: [facility];  Service: Cardiovascular;  Laterality: N/A;  . CARDIAC CATHETERIZATION N/A 07/22/2015   Procedure: Coronary Stent Intervention;  Surgeon: Leonie Man, MD;  Location: [facility];  Service: Cardiovascular;  Laterality: N/A;  . CORONARY STENT PLACEMENT  07/22/2015   mid lad  des   . EYE SURGERY     cataracts bilateral  . HEMORRHOID SURGERY    . INGUINAL HERNIA REPAIR  10/11/2011   Procedure: HERNIA REPAIR INGUINAL ADULT;  Surgeon: Stark Klein, MD;  Location: [facility];  Service: General;  Laterality: Right;  . OPEN SURGICAL REPAIR OF GLUTEAL TENDON Left 03/12/2014   Procedure: LEFT HIP BURSECTOMY AND GLUTEAL TENDON REPAIR;  Surgeon: [name], MD;  Location: [facility];  Service: Orthopedics;  Laterality: Left;    FAMHx:  Family History  Problem Relation Age of Onset  . Heart disease Father   . Lung cancer Sister   . Lung cancer Brother     SOCHx:   reports that she has never smoked. She has never used smokeless tobacco. She reports that she does not drink alcohol or use drugs.  ALLERGIES:  Allergies  Allergen Reactions  . Chlorhexidine Itching  . Vancomycin Hives  . Aspirin Other (See Comments)    Nose bleeds with full strength  . Atorvastatin Other (See Comments)    Muscle pain  . Bactrim [Sulfamethoxazole-Trimethoprim] Other (See Comments)    Doesn't remember   . Cephalosporins Hives and Itching  . Ciprofloxacin Nausea Only  . Clarithromycin Other (See Comments)    Doesn't remember   . Colestipol Hcl Other (See Comments)    Hallucinations  . Ezetimibe Other (See Comments)    GI upset  . Simvastatin Other (See Comments)    Muscle spasms  . Penicillins Rash      Has patient had a PCN reaction causing immediate rash, facial/tongue/throat swelling, SOB or lightheadedness with hypotension: yes Has patient had a PCN reaction causing severe rash involving mucus membranes or skin necrosis: no Has patient had a PCN reaction that required hospitalization no Has patient had a PCN reaction occurring within the last 10 years: no If all of the above answers are "NO", then may proceed with Cephalosporin use.   . Tape Rash    ROS: Pertinent items noted in HPI and remainder of comprehensive ROS otherwise negative.  HOME MEDS:  Current Outpatient Prescriptions  Medication Sig Dispense Refill  . aspirin EC 81 MG tablet Take 81 mg by mouth daily.    . clopidogrel (PLAVIX) 75 MG tablet Take 75 mg by mouth daily.    . Cyanocobalamin (VITAMIN B12 PO) Take 1 tablet by mouth daily.    . fluticasone (FLONASE) 50 MCG/ACT nasal spray USE TWO SPRAY IN EACH NOSTRIL EVERY DAY 50 g 1  . metoprolol tartrate (LOPRESSOR) 25 MG tablet Take 0.5 tablets (12.5 mg total) by mouth 2 (two) times daily. 30 tablet 11  . Multiple Vitamins-Minerals (OCUVITE PO) Take 1 tablet by mouth daily.    . nitroGLYCERIN (NITROSTAT) 0.4 MG SL tablet Place 1 tablet (0.4 mg total) under the tongue every 5 (five) minutes as needed for chest pain. 25 tablet 3  . Polyethyl Glycol-Propyl Glycol (SYSTANE OP) Apply 1 drop to eye 3 (three) times daily as needed (Dry eyes).    . ranolazine (RANEXA) 500 MG 12 hr tablet Take 1 tablet (500 mg total) by mouth 2 (two) times daily. 180 tablet 3   No current facility-administered medications for this visit.     LABS/IMAGING: No results found for this or any previous visit (from the past 48 hour(s)). No results found.  VITALS: BP 140/80 (BP Location: Right Arm, Patient Position: Sitting, Cuff Size: Normal)   Pulse 64   Ht 5\' 7"  (1.702 m)   Wt 150 lb 6.4 oz (68.2 kg)   SpO2 97%   BMI 23.56 kg/m   EXAM: GEN: Awake, in no apparent distress HEENT: PERRLA,  EOMI Lungs: Clear Cardiovascular: Regular rate and rhythm normal S1-S2 Abdomen: Soft, nontender positive bowel sounds Extremities: No edema Neurologic: No focal deficits Psychiatric: Normal mood, affect  EKG: Normal sinus rhythm at 64, voltage criteria for LVH and anterolateral T-wave inversions consistent with apical HCM  ASSESSMENT: 1. Unstable angina - status post PCI with a Promus DES (2.25 mm x 16 mm to the mid LAD)- known 50-60% ostial LAD  stenosis (FFR 0.99 in 2011) which was stable at repeat cath (06/2015) 2. Apical variant hypertrophic cardimyopathy - anterolateral TWI's - negative nuclear stress test 01-2015 3. Hypertension 4. Venous insufficiency 5. Right inguinal pain with a hernia, status post surgical repair 6. Right hip pain with degenerative hip disease - s/p surgery  PLAN: 1.   Mrs. Jinkerson is doing fairly well with cardiac rehabilitation. She is now up to 3.4 metabolic equivalents. I think ultimately she will have benefits although she reports stable dyspnea. She denies any angina and her repeat cath showed stable coronary disease. We'll continue with exercise and medical therapy and plan to see her back in 6 months.  [name], MD, [facility] Attending Cardiologist Alexander C Hilty 11/18/2015, 8:32 AM

## 2015-11-18 NOTE — Patient Instructions (Signed)
Medication Instructions:  Your physician recommends that you continue on your current medications as directed. Please refer to the Current Medication list given to you today.   Labwork: None ordered  Testing/Procedures: None ordered  Follow-Up: Your physician wants you to follow-up in: 6 months with Dr.Hilty You will receive a reminder letter in the mail two months in advance. If you don't receive a letter, please call our office to schedule the follow-up appointment.   Any Other Special Instructions Will Be Listed Below (If Applicable).     If you need a refill on your cardiac medications before your next appointment, please call your pharmacy.   

## 2015-11-21 ENCOUNTER — Encounter (HOSPITAL_COMMUNITY)
Admission: RE | Admit: 2015-11-21 | Discharge: 2015-11-21 | Disposition: A | Discharge status: Home / Self Care | Payer: Medicare Other | Source: Ambulatory Visit | Attending: Internal Medicine | Admitting: Internal Medicine

## 2015-11-21 DIAGNOSIS — Z955 Presence of coronary angioplasty implant and graft: Secondary | ICD-10-CM

## 2015-11-21 NOTE — Progress Notes (Signed)
Daily Session Note  Patient Details  Name: Lauren House MRN: [id number] Date of Birth: December 26, 1935 Referring Provider:   Flowsheet Row CARDIAC REHAB PHASE II ORIENTATION from 10/18/2015 in [facility]  Referring Provider  Dr. Ellyn Hack       Encounter Date: 11/21/2015  Check In:     Session Check In - 11/21/15 0815      Check-In   Location [facility]   Staff Present Russella Dar, MS, EP, [facility], Exercise Physiologist;Debra Wynetta Emery, RN, BSN   Supervising physician immediately available to respond to emergencies See telemetry face sheet for immediately available MD   Medication changes reported     No   Fall or balance concerns reported    No   Warm-up and Cool-down Performed as group-led instruction   Resistance Training Performed Yes     Pain Assessment   Currently in Pain? No/denies   Pain Score 0-No pain      Capillary Blood Glucose: No results found for this or any previous visit (from the past 24 hour(s)).   Goals Met:  Independence with exercise equipment Exercise tolerated well No report of cardiac concerns or symptoms Strength training completed today  Goals Unmet:  Not Applicable  Comments: Check out: 9:15   Dr. Kate Sable is Medical Director for [facility] and Pulmonary Rehab.

## 2015-11-23 ENCOUNTER — Encounter (HOSPITAL_COMMUNITY)
Admission: RE | Admit: 2015-11-23 | Discharge: 2015-11-23 | Disposition: A | Discharge status: Home / Self Care | Payer: Medicare Other | Source: Ambulatory Visit | Attending: Internal Medicine | Admitting: Internal Medicine

## 2015-11-23 DIAGNOSIS — Z955 Presence of coronary angioplasty implant and graft: Secondary | ICD-10-CM | POA: Diagnosis not present

## 2015-11-23 NOTE — Progress Notes (Signed)
Daily Session Note  Patient Details  Name: NAHLA LUKIN MRN: [id number] Date of Birth: October 26, 1935 Referring Provider:   Flowsheet Row CARDIAC REHAB PHASE II ORIENTATION from 10/18/2015 in [facility]  Referring Provider  Dr. Ellyn Hack       Encounter Date: 11/23/2015  Check In:     Session Check In - 11/23/15 0815      Check-In   Location [facility]   Staff Present Diane Angelina Pih, MS, EP, [facility], Exercise Physiologist;Norlan Rann Luther Parody, BS, EP, Exercise Physiologist;Debra Wynetta Emery, RN, BSN   Supervising physician immediately available to respond to emergencies See telemetry face sheet for immediately available MD   Medication changes reported     No   Fall or balance concerns reported    No   Warm-up and Cool-down Performed as group-led instruction   Resistance Training Performed Yes   VAD Patient? No     Pain Assessment   Currently in Pain? No/denies   Pain Score 0-No pain   Multiple Pain Sites No      Capillary Blood Glucose: No results found for this or any previous visit (from the past 24 hour(s)).   Goals Met:  Independence with exercise equipment Exercise tolerated well No report of cardiac concerns or symptoms Strength training completed today  Goals Unmet:  Not Applicable  Comments: Check out 915   Dr. Kate Sable is Medical Director for [place] and Pulmonary Rehab.

## 2015-11-25 ENCOUNTER — Encounter (HOSPITAL_COMMUNITY)
Admission: RE | Admit: 2015-11-25 | Discharge: 2015-11-25 | Disposition: A | Discharge status: Home / Self Care | Payer: Medicare Other | Source: Ambulatory Visit | Attending: Internal Medicine | Admitting: Internal Medicine

## 2015-11-25 DIAGNOSIS — Z955 Presence of coronary angioplasty implant and graft: Secondary | ICD-10-CM | POA: Diagnosis not present

## 2015-11-25 NOTE — Progress Notes (Signed)
Daily Session Note  Patient Details  Name: Lauren House MRN: [id number] Date of Birth: 12/22/1935 Referring Provider:   Flowsheet Row CARDIAC REHAB PHASE II ORIENTATION from 10/18/2015 in [facility]  Referring Provider  Dr. Harding       Encounter Date: 11/25/2015  Check In:     Session Check In - 11/25/15 0815      Check-In   Location [facility]   Staff Present Diane Coad, MS, EP, [facility], Exercise Physiologist;Braeden Dolinski, BS, EP, Exercise Physiologist   Supervising physician immediately available to respond to emergencies See telemetry face sheet for immediately available MD   Medication changes reported     No   Fall or balance concerns reported    No   Warm-up and Cool-down Performed as group-led instruction   Resistance Training Performed Yes   VAD Patient? No     Pain Assessment   Currently in Pain? No/denies   Pain Score 0-No pain   Multiple Pain Sites No      Capillary Blood Glucose: No results found for this or any previous visit (from the past 24 hour(s)).   Goals Met:  Independence with exercise equipment Exercise tolerated well No report of cardiac concerns or symptoms Strength training completed today  Goals Unmet:  Not Applicable  Comments: Check out 915   Dr. Suresh Koneswaran is Medical Director for [facility]. 

## 2015-11-28 ENCOUNTER — Encounter (HOSPITAL_COMMUNITY)
Admission: RE | Admit: 2015-11-28 | Discharge: 2015-11-28 | Disposition: A | Discharge status: Home / Self Care | Payer: Medicare Other | Source: Ambulatory Visit | Attending: Internal Medicine | Admitting: Internal Medicine

## 2015-11-28 DIAGNOSIS — Z955 Presence of coronary angioplasty implant and graft: Secondary | ICD-10-CM | POA: Insufficient documentation

## 2015-11-28 NOTE — Progress Notes (Signed)
Daily Session Note  Patient Details  Name: Lauren House MRN: [id number] Date of Birth: 09/30/1935 Referring Provider:   Flowsheet Row CARDIAC REHAB PHASE II ORIENTATION from 10/18/2015 in [facility]  Referring Provider  Dr. Harding       Encounter Date: 11/28/2015  Check In:     Session Check In - 11/28/15 0815      Check-In   Location [facility]   Staff Present Diane Coad, MS, EP, [facility], Exercise Physiologist;Debra Johnson, RN, BSN   Supervising physician immediately available to respond to emergencies See telemetry face sheet for immediately available MD   Medication changes reported     No   Fall or balance concerns reported    No   Warm-up and Cool-down Performed as group-led instruction   Resistance Training Performed Yes   VAD Patient? No     Pain Assessment   Currently in Pain? No/denies   Pain Score 0-No pain   Multiple Pain Sites No      Capillary Blood Glucose: No results found for this or any previous visit (from the past 24 hour(s)).   Goals Met:  Independence with exercise equipment Exercise tolerated well No report of cardiac concerns or symptoms Strength training completed today  Goals Unmet:  Not Applicable  Comments: Check out 915   Dr. Suresh Koneswaran is Medical Director for [facility] and Pulmonary Rehab. 

## 2015-11-30 ENCOUNTER — Encounter (HOSPITAL_COMMUNITY)
Admission: RE | Admit: 2015-11-30 | Discharge: 2015-11-30 | Disposition: A | Discharge status: Home / Self Care | Payer: Medicare Other | Source: Ambulatory Visit | Attending: Internal Medicine | Admitting: Internal Medicine

## 2015-11-30 DIAGNOSIS — Z955 Presence of coronary angioplasty implant and graft: Secondary | ICD-10-CM

## 2015-11-30 NOTE — Progress Notes (Signed)
Daily Session Note  Patient Details  Name: Lauren House MRN: [id number] Date of Birth: 1935-03-13 Referring Provider:   Flowsheet Row CARDIAC REHAB PHASE II ORIENTATION from 10/18/2015 in [facility]  Referring Provider  Dr. Ellyn Hack       Encounter Date: 11/30/2015  Check In:     Session Check In - 11/30/15 0815      Check-In   Location [facility]   Staff Present Aundra Dubin, RN, BSN;Jesstin Studstill Luther Parody, BS, EP, Exercise Physiologist;Diane Coad, MS, EP, [facility], Exercise Physiologist   Supervising physician immediately available to respond to emergencies See telemetry face sheet for immediately available MD   Medication changes reported     No   Fall or balance concerns reported    No   Warm-up and Cool-down Performed as group-led instruction   Resistance Training Performed Yes   VAD Patient? No     Pain Assessment   Currently in Pain? No/denies   Pain Score 0-No pain   Multiple Pain Sites No      Capillary Blood Glucose: No results found for this or any previous visit (from the past 24 hour(s)).   Goals Met:  Independence with exercise equipment Exercise tolerated well No report of cardiac concerns or symptoms Strength training completed today  Goals Unmet:  Not Applicable  Comments: Check out 915   Dr. Kate Sable is Medical Director for [place] and Pulmonary Rehab.

## 2015-12-02 ENCOUNTER — Encounter (HOSPITAL_COMMUNITY)
Admission: RE | Admit: 2015-12-02 | Discharge: 2015-12-02 | Disposition: A | Discharge status: Home / Self Care | Payer: Medicare Other | Source: Ambulatory Visit | Attending: Internal Medicine | Admitting: Internal Medicine

## 2015-12-02 DIAGNOSIS — Z955 Presence of coronary angioplasty implant and graft: Secondary | ICD-10-CM | POA: Diagnosis not present

## 2015-12-02 NOTE — Progress Notes (Signed)
Daily Session Note  Patient Details  Name: KIELEY AKTER MRN: [id number] Date of Birth: [birth year] Referring Provider:   Flowsheet Row CARDIAC REHAB PHASE II ORIENTATION from 10/18/2015 in [facility]  Referring Provider  Dr. Ellyn Hack       Encounter Date: 12/02/2015  Check In:     Session Check In - 12/02/15 0808      Check-In   Location [facility]   Staff Present Aundra Dubin, RN, BSN;Tajah Schreiner Luther Parody, BS, EP, Exercise Physiologist   Supervising physician immediately available to respond to emergencies See telemetry face sheet for immediately available MD   Medication changes reported     No   Fall or balance concerns reported    No   Warm-up and Cool-down Performed as group-led instruction   Resistance Training Performed Yes   VAD Patient? No     Pain Assessment   Currently in Pain? No/denies   Pain Score 0-No pain   Multiple Pain Sites No      Capillary Blood Glucose: No results found for this or any previous visit (from the past 24 hour(s)).   Goals Met:  Independence with exercise equipment Exercise tolerated well No report of cardiac concerns or symptoms Strength training completed today  Goals Unmet:  Not Applicable  Comments: Check out 915   Dr. Kate Sable is Medical Director for [facility] and Pulmonary Rehab.

## 2015-12-05 ENCOUNTER — Encounter (HOSPITAL_COMMUNITY)
Admission: RE | Admit: 2015-12-05 | Discharge: 2015-12-05 | Disposition: A | Discharge status: Home / Self Care | Payer: Medicare Other | Source: Ambulatory Visit | Attending: Internal Medicine | Admitting: Internal Medicine

## 2015-12-05 DIAGNOSIS — Z955 Presence of coronary angioplasty implant and graft: Secondary | ICD-10-CM | POA: Diagnosis not present

## 2015-12-05 NOTE — Progress Notes (Signed)
Daily Session Note  Patient Details  Name: Lauren House MRN: [id number] Date of Birth: [birth year] Referring Provider:   Flowsheet Row CARDIAC REHAB PHASE II ORIENTATION from 10/18/2015 in [facility]  Referring Provider  Dr. Ellyn Hack       Encounter Date: 12/05/2015  Check In:     Session Check In - 12/05/15 0815      Check-In   Location [facility]   Staff Present Russella Dar, MS, EP, [facility], Exercise Physiologist;Ziana Heyliger Wynetta Emery, RN, BSN   Supervising physician immediately available to respond to emergencies See telemetry face sheet for immediately available MD   Medication changes reported     No   Fall or balance concerns reported    No   Warm-up and Cool-down Performed as group-led instruction   Resistance Training Performed Yes   VAD Patient? No     Pain Assessment   Currently in Pain? No/denies   Pain Score 0-No pain   Multiple Pain Sites No      Capillary Blood Glucose: No results found for this or any previous visit (from the past 24 hour(s)).   Goals Met:  Independence with exercise equipment Exercise tolerated well No report of cardiac concerns or symptoms Strength training completed today  Goals Unmet:  Not Applicable  Comments: Check out 915   Dr. Kate Sable is Medical Director for [place] and Pulmonary Rehab.

## 2015-12-07 ENCOUNTER — Encounter (HOSPITAL_COMMUNITY)
Admission: RE | Admit: 2015-12-07 | Discharge: 2015-12-07 | Disposition: A | Discharge status: Home / Self Care | Payer: Medicare Other | Source: Ambulatory Visit | Attending: Internal Medicine | Admitting: Internal Medicine

## 2015-12-07 DIAGNOSIS — Z955 Presence of coronary angioplasty implant and graft: Secondary | ICD-10-CM

## 2015-12-07 NOTE — Progress Notes (Signed)
Daily Session Note  Patient Details  Name: Lauren House MRN: [id number] Date of Birth: 08/27/1935 Referring Provider:   Flowsheet Row CARDIAC REHAB PHASE II ORIENTATION from 10/18/2015 in [place]  Referring Provider  Dr. Ellyn Hack       Encounter Date: 12/07/2015  Check In:     Session Check In - 12/07/15 0815      Check-In   Location [facility]   Staff Present Suzanne Boron, BS, EP, Exercise Physiologist;Loreal Schuessler Wynetta Emery, RN, BSN;Diane Coad, MS, EP, [facility], Exercise Physiologist   Supervising physician immediately available to respond to emergencies See telemetry face sheet for immediately available MD   Medication changes reported     No   Fall or balance concerns reported    No   Warm-up and Cool-down Performed as group-led instruction   Resistance Training Performed Yes   VAD Patient? No     Pain Assessment   Currently in Pain? No/denies   Pain Score 0-No pain   Multiple Pain Sites No      Capillary Blood Glucose: No results found for this or any previous visit (from the past 24 hour(s)).   Goals Met:  Independence with exercise equipment Exercise tolerated well No report of cardiac concerns or symptoms Strength training completed today  Goals Unmet:  Not Applicable  Comments: Check out 915.   Dr. Kate Sable is Medical Director for [facility] and Pulmonary Rehab.

## 2015-12-07 NOTE — Progress Notes (Signed)
Cardiac Individual Treatment Plan  Patient Details  Name: Lauren House MRN: [id number] Date of Birth: August 02, 1935 Referring Provider:   Flowsheet Row CARDIAC REHAB PHASE II ORIENTATION from 10/18/2015 in [place]  Referring Provider  Dr. Ellyn Hack       Initial Encounter Date:  Flowsheet Row CARDIAC REHAB PHASE II ORIENTATION from 10/18/2015 in [place]  Date  10/18/15  Referring Provider  Dr. Ellyn Hack       Visit Diagnosis: Stented coronary artery  Patient's Home Medications on Admission:  Current Outpatient Prescriptions:  .  aspirin EC 81 MG tablet, Take 81 mg by mouth daily., Disp: , Rfl:  .  clopidogrel (PLAVIX) 75 MG tablet, Take 75 mg by mouth daily., Disp: , Rfl:  .  Cyanocobalamin (VITAMIN B12 PO), Take 1 tablet by mouth daily., Disp: , Rfl:  .  fluticasone (FLONASE) 50 MCG/ACT nasal spray, USE TWO SPRAY IN EACH NOSTRIL EVERY DAY, Disp: 50 g, Rfl: 1 .  metoprolol tartrate (LOPRESSOR) 25 MG tablet, Take 0.5 tablets (12.5 mg total) by mouth 2 (two) times daily., Disp: 30 tablet, Rfl: 11 .  Multiple Vitamins-Minerals (OCUVITE PO), Take 1 tablet by mouth daily., Disp: , Rfl:  .  nitroGLYCERIN (NITROSTAT) 0.4 MG SL tablet, Place 1 tablet (0.4 mg total) under the tongue every 5 (five) minutes as needed for chest pain., Disp: 25 tablet, Rfl: 3 .  Polyethyl Glycol-Propyl Glycol (SYSTANE OP), Apply 1 drop to eye 3 (three) times daily as needed (Dry eyes)., Disp: , Rfl:  .  ranolazine (RANEXA) 500 MG 12 hr tablet, Take 1 tablet (500 mg total) by mouth 2 (two) times daily., Disp: 180 tablet, Rfl: 3  Past Medical History: Past Medical History:  Diagnosis Date  . Anxiety   . Apical variant hypertrophic cardiomyopathy ([place])   . Arthritis   . Borderline diabetes mellitus   . Bruises easily   . Bursitis of left hip   . Coronary atherosclerosis of native coronary artery    a. 2011: Cath showing nonobstructive CAD - 50-60% LAD (normal  FFR) b. 06/2015: Cath showing 80% stenosis in the Mid LAD, tx w/ Promus DES 2.25 mm x 16 mm   . Essential hypertension, benign   . History of kidney stones   . History of skin cancer    Basal cell - abdomen  . Hyperlipidemia    Diet controlled.cannot take meds  . Nocturia   . Osteoporosis   . Overactive bladder   . Shortness of breath dyspnea   . Vertigo     Tobacco Use: History  Smoking Status  . Never Smoker  Smokeless Tobacco  . Never Used    Labs: Recent Review Flowsheet Data    Labs for ITP Cardiac and Pulmonary Rehab Latest Ref Rng & Units 08/20/2005 09/02/2008 02/10/2010 12/23/2012 11/03/2015   Cholestrol 125 - 200 mg/dL 297 267(H) 249 ATP III CLASSIFICATION: <200     mg/dL   Desirable 200-239  mg/dL   Borderline High >=240    mg/dL   High(H) 287(H) 279(H)   LDLCALC <130 mg/dL 207 177(H) 174 Total Cholesterol/HDL:CHD Risk Coronary Heart Disease Risk Table Men   Women 1/2 Average Risk   3.4   3.3 Average Risk       5.0   4.4 2 X Average Risk   9.6   7.1 3 X Average Risk  23.4   11.0 Use the calculated Patient Ratio above and the CHD Risk Table to determine the patient's  CHD Risk. ATP III CLASSIFICATION (LDL): <100     mg/dL   Optimal 100-129  mg/dL   Near or Above Optimal 130-159  mg/dL   Borderline 160-189  mg/dL   High >190     mg/dL   Very High(H) 166(H) 188(H)   HDL >=46 mg/dL 46 51 36(L) 47 48   Trlycerides <150 mg/dL 222 193(H) 196(H) 368(H) 216(H)   Hemoglobin A1c <5.7 % - - 6.3 (NOTE)                                                                       According to the ADA Clinical Practice Recommendations for 2011, when HbA1c is used as a screening test:   >=6.5%   Diagnostic of Diabetes Mellitus           (if abnormal result is confirmed)  5.7-6.4%   Increased risk of developing Diabetes Mellitus  References:Diagnosis and Classification of Diabetes Mellitus,Diabetes [id number] 1):S62-S69 and Standards of Medical Care in         Diabetes -  2011,Diabetes [id number] (Suppl 1):S11-S61.(H) - -      Capillary Blood Glucose: Lab Results  Component Value Date   GLUCAP 93 10/11/2011   GLUCAP 109 (H) 10/11/2011   GLUCAP 107 (H) 02/10/2010     Exercise Target Goals:    Exercise Program Goal: Individual exercise prescription set with THRR, safety & activity barriers. Participant demonstrates ability to understand and report RPE using BORG scale, to self-measure pulse accurately, and to acknowledge the importance of the exercise prescription.  Exercise Prescription Goal: Starting with aerobic activity 30 plus minutes a day, 3 days per week for initial exercise prescription. Provide home exercise prescription and guidelines that participant acknowledges understanding prior to discharge.  Activity Barriers & Risk Stratification:     Activity Barriers & Cardiac Risk Stratification - 10/18/15 1645      Activity Barriers & Cardiac Risk Stratification   Activity Barriers Joint Problems  due to hip tendon surgery it would be difficult to walk on TM.    Cardiac Risk Stratification High      6 Minute Walk:     6 Minute Walk    Row Name 10/19/15 1407 10/19/15 1408       6 Minute Walk   Phase Initial Initial    Distance  - 1200 feet    Walk Time  - 6 minutes    # of Rest Breaks  - 0    MPH  - 2.27    METS  - 2.74    RPE  - 11    Perceived Dyspnea   - 9    VO2 Peak  - 7.4    Symptoms  - No    Resting HR  - 66 bpm    Resting BP  - 132/72    Max Ex. HR  - 89 bpm    Max Ex. BP  - 174/72    2 Minute Post BP  - 140/74       Initial Exercise Prescription:     Initial Exercise Prescription - 10/19/15 1400      Date of Initial Exercise RX and Referring Provider   Date 10/18/15   Referring Provider Dr. Ellyn Hack  NuStep   Level 2   Watts 15   Minutes 15   METs 1.9     Arm Ergometer   Level 2   Watts 15   Minutes 20   METs 1.9     Prescription Details   Frequency (times per week) 3   Duration  Progress to 30 minutes of continuous aerobic without signs/symptoms of physical distress     Intensity   THRR REST +  30   THRR 40-80% of Max Heartrate 96-112-127   Ratings of Perceived Exertion 11-13   Perceived Dyspnea 0-4     Progression   Progression Continue progressive overload as per policy without signs/symptoms or physical distress.     Resistance Training   Training Prescription Yes   Weight 1   Reps 10-12      Perform Capillary Blood Glucose checks as needed.  Exercise Prescription Changes:      Exercise Prescription Changes    Row Name 11/08/15 1500 12/06/15 0800           Exercise Review   Progression Yes Yes        Response to Exercise   Blood Pressure (Admit) 134/68 124/66      Blood Pressure (Exercise) 144/74 152/76      Blood Pressure (Exit) 122/68 126/64      Heart Rate (Admit) 73 bpm 76 bpm      Heart Rate (Exercise) 80 bpm 88 bpm      Heart Rate (Exit) 74 bpm 81 bpm      Rating of Perceived Exertion (Exercise) 10 9      Duration Progress to 30 minutes of continuous aerobic without signs/symptoms of physical distress Progress to 30 minutes of continuous aerobic without signs/symptoms of physical distress      Intensity Rest + 30 Rest + 30        Progression   Progression Continue progressive overload as per policy without signs/symptoms or physical distress. Continue progressive overload as per policy without signs/symptoms or physical distress.        Resistance Training   Training Prescription Yes Yes      Weight 1 3      Reps 10-12 10-12        NuStep   Level 2 2      Watts 18 23      Minutes 15 15      METs 3.43 3.43        Arm Ergometer   Level 2 2.6      Watts 19 23      Minutes 20 20      METs 2.4 2.8        Home Exercise Plan   Plans to continue exercise at [place] 2 additional days to program exercise sessions. Add 2 additional days to program exercise sessions.         Exercise Comments:       Exercise Comments    Row Name 11/08/15 1540 12/06/15 0814         Exercise Comments Patient is proggressing appropriately Patient is progressing well           Discharge Exercise Prescription (Final Exercise Prescription Changes):     Exercise Prescription Changes - 12/06/15 0800      Exercise Review   Progression Yes     Response to Exercise   Blood Pressure (Admit) 124/66   Blood Pressure (Exercise) 152/76   Blood  Pressure (Exit) 126/64   Heart Rate (Admit) 76 bpm   Heart Rate (Exercise) 88 bpm   Heart Rate (Exit) 81 bpm   Rating of Perceived Exertion (Exercise) 9   Duration Progress to 30 minutes of continuous aerobic without signs/symptoms of physical distress   Intensity Rest + 30     Progression   Progression Continue progressive overload as per policy without signs/symptoms or physical distress.     Resistance Training   Training Prescription Yes   Weight 3   Reps 10-12     NuStep   Level 2   Watts 23   Minutes 15   METs 3.43     Arm Ergometer   Level 2.6   Watts 23   Minutes 20   METs 2.8     Home Exercise Plan   Plans to continue exercise at Home   Frequency Add 2 additional days to program exercise sessions.      Nutrition:  Target Goals: Understanding of nutrition guidelines, daily intake of sodium 1500mg , cholesterol 200mg , calories 30% from fat and 7% or less from saturated fats, daily to have 5 or more servings of fruits and vegetables.  Biometrics:     Pre Biometrics - 10/19/15 1410      Pre Biometrics   Height 5\' 7"  (1.702 m)   Weight 150 lb 5.7 oz (68.2 kg)   Waist Circumference 36.5 inches   Hip Circumference 36.5 inches   Waist to Hip Ratio 1 %   BMI (Calculated) 23.6   Triceps Skinfold 4 mm   % Body Fat 28 %   Grip Strength 54.7 kg   Flexibility 12.25 in   Single Leg Stand 20 seconds       Nutrition Therapy Plan and Nutrition Goals:   Nutrition Discharge: Rate Your Plate Scores:     Nutrition Assessments -  10/18/15 1659      MEDFICTS Scores   Pre Score 30      Nutrition Goals Re-Evaluation:   Psychosocial: Target Goals: Acknowledge presence or absence of depression, maximize coping skills, provide positive support system. Participant is able to verbalize types and ability to use techniques and skills needed for reducing stress and depression.  Initial Review & Psychosocial Screening:     Initial Psych Review & Screening - 10/18/15 1701      Initial Review   Current issues with Current Sleep Concerns     Family Dynamics   Good Support System? Yes     Barriers   Psychosocial barriers to participate in program There are no identifiable barriers or psychosocial needs.     Screening Interventions   Interventions Encouraged to exercise      Quality of Life Scores:     Quality of Life - 10/19/15 1412      Quality of Life Scores   Health/Function Pre 18 %   Socioeconomic Pre 22.5 %   Psych/Spiritual Pre 24.58 %   Family Pre 30 %   GLOBAL Pre 22.59 %      PHQ-9: Recent Review Flowsheet Data    Depression screen [facility] 2/9 10/18/2015   Decreased Interest 0   Down, Depressed, Hopeless 0   PHQ - 2 Score 0   Altered sleeping 1   Tired, decreased energy 0   Change in appetite 1   Feeling bad or failure about yourself  0   Trouble concentrating 0   Moving slowly or fidgety/restless 0   Suicidal thoughts 0   PHQ-9 Score 2  Difficult doing work/chores Somewhat difficult      Psychosocial Evaluation and Intervention:     Psychosocial Evaluation - 10/18/15 1702      Psychosocial Evaluation & Interventions   Interventions Encouraged to exercise with the program and follow exercise prescription   Continued Psychosocial Services Needed No      Psychosocial Re-Evaluation:     Psychosocial Re-Evaluation    [place] Name 11/11/15 1432 12/07/15 1457           Psychosocial Re-Evaluation   Interventions Encouraged to attend Cardiac Rehabilitation for the exercise  Encouraged to attend Cardiac Rehabilitation for the exercise      Comments Patient's QOL score was 22.59 and her PHQ-9 score was 2. She is not depressed and does not need counseling.  Patient's QOL score was 22.59 and PHQ-9 was 2. She is not depressed and does not need counseling.       Continued Psychosocial Services Needed  - No         Vocational Rehabilitation: Provide vocational rehab assistance to qualifying candidates.   Vocational Rehab Evaluation & Intervention:     Vocational Rehab - 10/18/15 1657      Initial Vocational Rehab Evaluation & Intervention   Assessment shows need for Vocational Rehabilitation No      Education: Education Goals: Education classes will be provided on a weekly basis, covering required topics. Participant will state understanding/return demonstration of topics presented.  Learning Barriers/Preferences:     Learning Barriers/Preferences - 10/18/15 1647      Learning Barriers/Preferences   Learning Barriers None   Learning Preferences Video;Verbal Instruction;Pictoral;Written Material      Education Topics: Hypertension, Hypertension Reduction -Define heart disease and high blood pressure. Discus how high blood pressure affects the body and ways to reduce high blood pressure.   Exercise and Your Heart -Discuss why it is important to exercise, the FITT principles of exercise, normal and abnormal responses to exercise, and how to exercise safely.   Angina -Discuss definition of angina, causes of angina, treatment of angina, and how to decrease risk of having angina.   Cardiac Medications -Review what the following cardiac medications are used for, how they affect the body, and side effects that may occur when taking the medications.  Medications include Aspirin, Beta blockers, calcium channel blockers, ACE Inhibitors, angiotensin receptor blockers, diuretics, digoxin, and antihyperlipidemics.   Congestive Heart Failure -Discuss the  definition of CHF, how to live with CHF, the signs and symptoms of CHF, and how keep track of weight and sodium intake.   Heart Disease and Intimacy -Discus the effect sexual activity has on the heart, how changes occur during intimacy as we age, and safety during sexual activity. Flowsheet Row CARDIAC REHAB PHASE II EXERCISE from 11/30/2015 in [place]  Date  11/02/15  Educator  DJ  Instruction Review Code  2- meets goals/outcomes      Smoking Cessation / COPD -Discuss different methods to quit smoking, the health benefits of quitting smoking, and the definition of COPD. Flowsheet Row CARDIAC REHAB PHASE II EXERCISE from 11/30/2015 in [place]  Date  11/09/15  Educator  DC  Instruction Review Code  2- meets goals/outcomes      Nutrition I: Fats -Discuss the types of cholesterol, what cholesterol does to the heart, and how cholesterol levels can be controlled. Flowsheet Row CARDIAC REHAB PHASE II EXERCISE from 11/30/2015 in [place]  Date  11/16/15  Educator  Russella Dar  Instruction  Review Code  2- meets goals/outcomes      Nutrition II: Labels -Discuss the different components of food labels and how to read food label [place] from 11/30/2015 in [place]  Date  11/23/15  Educator  Russella Dar  Instruction Review Code  2- meets goals/outcomes      Heart Parts and Heart Disease -Discuss the anatomy of the heart, the pathway of blood circulation through the heart, and these are affected by heart disease. Flowsheet Row CARDIAC REHAB PHASE II EXERCISE from 11/30/2015 in [place]  Date  11/30/15  Educator  DC  Instruction Review Code  2- meets goals/outcomes      Stress I: Signs and Symptoms -Discuss the causes of stress, how stress may lead to anxiety and depression, and ways to limit stress.   Stress II:  Relaxation -Discuss different types of relaxation techniques to limit stress.   Warning Signs of Stroke / TIA -Discuss definition of a stroke, what the signs and symptoms are of a stroke, and how to identify when someone is having stroke.   Knowledge Questionnaire Score:     Knowledge Questionnaire Score - 10/18/15 1657      Knowledge Questionnaire Score   Pre Score 20/24      Core Components/Risk Factors/Patient Goals at Admission:     Personal Goals and Risk Factors at Admission - 10/18/15 1659      Core Components/Risk Factors/Patient Goals on Admission    Weight Management Weight Maintenance  Patient's BMI WNL   Sedentary Yes   Intervention Provide advice, education, support and counseling about physical activity/exercise needs.;Develop an individualized exercise prescription for aerobic and resistive training based on initial evaluation findings, risk stratification, comorbidities and participant's personal goals.   Expected Outcomes Achievement of increased cardiorespiratory fitness and enhanced flexibility, muscular endurance and strength shown through measurements of functional capacity and personal statement of participant.   Increase Strength and Stamina Yes   Intervention Provide advice, education, support and counseling about physical activity/exercise needs.;Develop an individualized exercise prescription for aerobic and resistive training based on initial evaluation findings, risk stratification, comorbidities and participant's personal goals.   Expected Outcomes Achievement of increased cardiorespiratory fitness and enhanced flexibility, muscular endurance and strength shown through measurements of functional capacity and personal statement of participant.   Improve shortness of breath with ADL's Yes   Intervention Provide education, individualized exercise plan and daily activity instruction to help decrease symptoms of SOB with activities of daily living.   Expected  Outcomes Short Term: Achieves a reduction of symptoms when performing activities of daily living.   Personal Goal Other Yes   Personal Goal Gain strength and stamina, Do more activities w/o SOB   Intervention Attend CR 3 times week and supplement 2 days week at home.    Expected Outcomes To achieve personal goals.       Core Components/Risk Factors/Patient Goals Review:      Goals and Risk Factor Review    Row Name 10/18/15 1701 11/11/15 1430 12/07/15 1455         Core Components/Risk Factors/Patient Goals Review   Personal Goals Review Increase Strength and Stamina;Improve shortness of breath with ADL's Weight Management/Obesity;Increase Strength and Stamina;Improve shortness of breath with ADL's Weight Management/Obesity;Increase Strength and Stamina     Review  - After 6 sessions, patient has lost 0.8 lbs. She feels like the program has helped her with some improvement with SOB and strength.  After  16 sessions, patient has maintained her weight. She feels like the program is helping her with improvement with SOB, strength and stamina. She is doing well in the program.      Expected Outcomes  - Patient will continue to attend program increasing her strength and stamina and improved SOB.  Patient will continue to attend sessions and complete the program maintaining her weight with improved SOB, strength and stamina.         Core Components/Risk Factors/Patient Goals at Discharge (Final Review):      Goals and Risk Factor Review - 12/07/15 1455      Core Components/Risk Factors/Patient Goals Review   Personal Goals Review Weight Management/Obesity;Increase Strength and Stamina   Review After 16 sessions, patient has maintained her weight. She feels like the program is helping her with improvement with SOB, strength and stamina. She is doing well in the program.    Expected Outcomes Patient will continue to attend sessions and complete the program maintaining her weight with improved  SOB, strength and stamina.       ITP Comments:   Comments: 30 Day Review: Patient is doing well with program. Will continue to monitor for progress.

## 2015-12-09 ENCOUNTER — Encounter (HOSPITAL_COMMUNITY)
Admission: RE | Admit: 2015-12-09 | Discharge: 2015-12-09 | Disposition: A | Discharge status: Home / Self Care | Payer: Medicare Other | Source: Ambulatory Visit | Attending: Internal Medicine | Admitting: Internal Medicine

## 2015-12-09 DIAGNOSIS — Z955 Presence of coronary angioplasty implant and graft: Secondary | ICD-10-CM

## 2015-12-09 NOTE — Progress Notes (Signed)
Daily Session Note  Patient Details  Name: Lauren House MRN: [id number] Date of Birth: 10-Dec-1935 Referring Provider:   Flowsheet Row CARDIAC REHAB PHASE II ORIENTATION from 10/18/2015 in [place]  Referring Provider  Dr. Ellyn Hack       Encounter Date: 12/09/2015  Check In:     Session Check In - 12/09/15 0815      Check-In   Location [facility]   Staff Present Russella Dar, MS, EP, [facility], Exercise Physiologist;Debra Wynetta Emery, RN, BSN   Supervising physician immediately available to respond to emergencies See telemetry face sheet for immediately available MD   Medication changes reported     No   Fall or balance concerns reported    No   Warm-up and Cool-down Performed as group-led instruction   Resistance Training Performed Yes   VAD Patient? No     Pain Assessment   Currently in Pain? No/denies   Pain Score 0-No pain   Multiple Pain Sites No      Capillary Blood Glucose: No results found for this or any previous visit (from the past 24 hour(s)).   Goals Met:  Independence with exercise equipment Exercise tolerated well No report of cardiac concerns or symptoms Strength training completed today  Goals Unmet:  Not Applicable  Comments: Check out 915   Dr. Kate Sable is Medical Director for [place] and Pulmonary Rehab.

## 2015-12-12 ENCOUNTER — Encounter (HOSPITAL_COMMUNITY)
Admission: RE | Admit: 2015-12-12 | Discharge: 2015-12-12 | Disposition: A | Discharge status: Home / Self Care | Payer: Medicare Other | Source: Ambulatory Visit | Attending: Internal Medicine | Admitting: Internal Medicine

## 2015-12-12 DIAGNOSIS — Z955 Presence of coronary angioplasty implant and graft: Secondary | ICD-10-CM

## 2015-12-12 NOTE — Progress Notes (Signed)
Daily Session Note  Patient Details  Name: Lauren House MRN: [id number] Date of Birth: 1935-07-05 Referring Provider:   Flowsheet Row CARDIAC REHAB PHASE II ORIENTATION from 10/18/2015 in [place]  Referring Provider  Dr. Ellyn Hack       Encounter Date: 12/12/2015  Check In:     Session Check In - 12/12/15 0815      Check-In   Location [facility]   Staff Present Russella Dar, MS, EP, [facility], Exercise Physiologist;Jovian Lembcke Wynetta Emery, RN, BSN   Supervising physician immediately available to respond to emergencies See telemetry face sheet for immediately available MD   Medication changes reported     No   Fall or balance concerns reported    No   Warm-up and Cool-down Performed as group-led instruction   Resistance Training Performed Yes   VAD Patient? No     Pain Assessment   Currently in Pain? No/denies   Pain Score 0-No pain   Multiple Pain Sites No      Capillary Blood Glucose: No results found for this or any previous visit (from the past 24 hour(s)).   Goals Met:  Independence with exercise equipment Exercise tolerated well No report of cardiac concerns or symptoms Strength training completed today  Goals Unmet:  Not Applicable  Comments: Check out 915.   Dr. Kate Sable is Medical Director for [facility] Cardiac and Pulmonary Rehab.

## 2015-12-14 ENCOUNTER — Encounter (HOSPITAL_COMMUNITY)
Admission: RE | Admit: 2015-12-14 | Discharge: 2015-12-14 | Disposition: A | Discharge status: Home / Self Care | Payer: Medicare Other | Source: Ambulatory Visit | Attending: Internal Medicine | Admitting: Internal Medicine

## 2015-12-14 DIAGNOSIS — Z955 Presence of coronary angioplasty implant and graft: Secondary | ICD-10-CM

## 2015-12-14 NOTE — Progress Notes (Signed)
Daily Session Note  Patient Details  Name: ALYN RIEDINGER MRN: [id number] Date of Birth: 11-Sep-1935 Referring Provider:   Flowsheet Row CARDIAC REHAB PHASE II ORIENTATION from 10/18/2015 in [place]  Referring Provider  Dr. Ellyn Hack       Encounter Date: 12/14/2015  Check In:     Session Check In - 12/14/15 0815      Check-In   Location [facility]   Staff Present Suzanne Boron, BS, EP, Exercise Physiologist;Debra Wynetta Emery, RN, BSN   Supervising physician immediately available to respond to emergencies See telemetry face sheet for immediately available MD   Medication changes reported     No   Fall or balance concerns reported    No   Warm-up and Cool-down Performed as group-led instruction   Resistance Training Performed Yes   VAD Patient? No     Pain Assessment   Currently in Pain? No/denies   Pain Score 0-No pain   Multiple Pain Sites No      Capillary Blood Glucose: No results found for this or any previous visit (from the past 24 hour(s)).   Goals Met:  Independence with exercise equipment Exercise tolerated well No report of cardiac concerns or symptoms Strength training completed today  Goals Unmet:  Not Applicable  Comments: Check out 915   Dr. Kate Sable is Medical Director for [place] and Pulmonary Rehab.

## 2015-12-16 ENCOUNTER — Encounter (HOSPITAL_COMMUNITY)
Admission: RE | Admit: 2015-12-16 | Discharge: 2015-12-16 | Disposition: A | Discharge status: Home / Self Care | Payer: Medicare Other | Source: Ambulatory Visit | Attending: Internal Medicine | Admitting: Internal Medicine

## 2015-12-16 DIAGNOSIS — Z955 Presence of coronary angioplasty implant and graft: Secondary | ICD-10-CM

## 2015-12-16 NOTE — Progress Notes (Signed)
Daily Session Note  Patient Details  Name: Lauren House MRN: [id number] Date of Birth: [birth year] Referring Provider:   Flowsheet Row CARDIAC REHAB PHASE II ORIENTATION from 10/18/2015 in [place]  Referring Provider  Dr. Ellyn Hack       Encounter Date: 12/16/2015  Check In:     Session Check In - 12/16/15 0815      Check-In   Location [facility]   Staff Present Suzanne Boron, BS, EP, Exercise Physiologist;Diane Coad, MS, EP, [facility], Exercise Physiologist   Supervising physician immediately available to respond to emergencies See telemetry face sheet for immediately available MD   Medication changes reported     No   Fall or balance concerns reported    No   Warm-up and Cool-down Performed as group-led instruction   Resistance Training Performed Yes   VAD Patient? No     Pain Assessment   Currently in Pain? No/denies   Pain Score 0-No pain   Multiple Pain Sites No      Capillary Blood Glucose: No results found for this or any previous visit (from the past 24 hour(s)).   Goals Met:  Independence with exercise equipment Exercise tolerated well No report of cardiac concerns or symptoms Strength training completed today  Goals Unmet:  Not Applicable  Comments: Check out 915   Dr. Kate Sable is Medical Director for [place] and Pulmonary Rehab.

## 2015-12-19 ENCOUNTER — Encounter (HOSPITAL_COMMUNITY)
Admission: RE | Admit: 2015-12-19 | Discharge: 2015-12-19 | Disposition: A | Discharge status: Home / Self Care | Payer: Medicare Other | Source: Ambulatory Visit | Attending: Internal Medicine | Admitting: Internal Medicine

## 2015-12-19 DIAGNOSIS — Z955 Presence of coronary angioplasty implant and graft: Secondary | ICD-10-CM | POA: Diagnosis not present

## 2015-12-19 NOTE — Progress Notes (Signed)
Daily Session Note  Patient Details  Name: Lauren House MRN: [id number] Date of Birth: 1935/04/04 Referring Provider:   Flowsheet Row CARDIAC REHAB PHASE II ORIENTATION from 10/18/2015 in [place]  Referring Provider  Dr. Ellyn Hack       Encounter Date: 12/19/2015  Check In:     Session Check In - 12/19/15 0815      Check-In   Location [facility]   Staff Present Russella Dar, MS, EP, [facility], Exercise Physiologist;Debra Wynetta Emery, RN, BSN   Supervising physician immediately available to respond to emergencies See telemetry face sheet for immediately available MD   Medication changes reported     No   Fall or balance concerns reported    No   Warm-up and Cool-down Performed as group-led instruction   Resistance Training Performed Yes   VAD Patient? No     Pain Assessment   Currently in Pain? No/denies   Pain Score 0-No pain   Multiple Pain Sites No      Capillary Blood Glucose: No results found for this or any previous visit (from the past 24 hour(s)).   Goals Met:  Independence with exercise equipment Exercise tolerated well No report of cardiac concerns or symptoms Strength training completed today  Goals Unmet:  Not Applicable  Comments: Check out 915   Dr. Kate Sable is Medical Director for [place] and Pulmonary Rehab.

## 2015-12-21 ENCOUNTER — Encounter (HOSPITAL_COMMUNITY)
Admission: RE | Admit: 2015-12-21 | Discharge: 2015-12-21 | Disposition: A | Discharge status: Home / Self Care | Payer: Medicare Other | Source: Ambulatory Visit | Attending: Internal Medicine | Admitting: Internal Medicine

## 2015-12-21 DIAGNOSIS — Z955 Presence of coronary angioplasty implant and graft: Secondary | ICD-10-CM

## 2015-12-21 NOTE — Progress Notes (Signed)
Daily Session Note  Patient Details  Name: Lauren House MRN: [id number] Date of Birth: [birth year] Referring Provider:   Flowsheet Row CARDIAC REHAB PHASE II ORIENTATION from 10/18/2015 in [place]  Referring Provider  Dr. Ellyn Hack       Encounter Date: 12/21/2015  Check In:     Session Check In - 12/21/15 0815      Check-In   Location [facility]   Staff Present Aundra Dubin, RN, BSN;Makaylah Oddo Luther Parody, BS, EP, Exercise Physiologist   Supervising physician immediately available to respond to emergencies See telemetry face sheet for immediately available MD   Medication changes reported     No   Fall or balance concerns reported    No   Warm-up and Cool-down Performed as group-led instruction   Resistance Training Performed Yes   VAD Patient? No     Pain Assessment   Currently in Pain? No/denies   Pain Score 0-No pain   Multiple Pain Sites No      Capillary Blood Glucose: No results found for this or any previous visit (from the past 24 hour(s)).   Goals Met:  Independence with exercise equipment Exercise tolerated well No report of cardiac concerns or symptoms Strength training completed today  Goals Unmet:  Not Applicable  Comments: Check out 915   Dr. Kate Sable is Medical Director for [place] and Pulmonary Rehab.

## 2015-12-23 ENCOUNTER — Encounter (HOSPITAL_COMMUNITY)
Admission: RE | Admit: 2015-12-23 | Discharge: 2015-12-23 | Disposition: A | Discharge status: Home / Self Care | Payer: Medicare Other | Source: Ambulatory Visit | Attending: Internal Medicine | Admitting: Internal Medicine

## 2015-12-23 DIAGNOSIS — Z955 Presence of coronary angioplasty implant and graft: Secondary | ICD-10-CM

## 2015-12-23 NOTE — Progress Notes (Signed)
Daily Session Note  Patient Details  Name: Lauren House MRN: [id number] Date of Birth: 08/20/1935 Referring Provider:   Flowsheet Row CARDIAC REHAB PHASE II ORIENTATION from 10/18/2015 in [place] [facility]  Referring Provider  Dr. Harding       Encounter Date: 12/23/2015  Check In:     Session Check In - 12/23/15 0815      Check-In   Location [facility]   Staff Present Diane Coad, MS, EP, [facility], Exercise Physiologist;Debra Johnson, RN, BSN   Supervising physician immediately available to respond to emergencies See telemetry face sheet for immediately available MD   Medication changes reported     No   Fall or balance concerns reported    No   Warm-up and Cool-down Performed as group-led instruction   Resistance Training Performed Yes   VAD Patient? No     Pain Assessment   Currently in Pain? No/denies   Pain Score 0-No pain   Multiple Pain Sites No      Capillary Blood Glucose: No results found for this or any previous visit (from the past 24 hour(s)).   Goals Met:  Independence with exercise equipment Exercise tolerated well No report of cardiac concerns or symptoms Strength training completed today  Goals Unmet:  Not Applicable  Comments: Check out 915.   Dr. Suresh Koneswaran is Medical Director for  [facility]. 

## 2015-12-26 ENCOUNTER — Encounter (HOSPITAL_COMMUNITY): Payer: Medicare Other

## 2015-12-28 ENCOUNTER — Encounter (HOSPITAL_COMMUNITY)
Admission: RE | Admit: 2015-12-28 | Discharge: 2015-12-28 | Disposition: A | Discharge status: Home / Self Care | Payer: Medicare Other | Source: Ambulatory Visit | Attending: Internal Medicine | Admitting: Internal Medicine

## 2015-12-28 DIAGNOSIS — Z955 Presence of coronary angioplasty implant and graft: Secondary | ICD-10-CM

## 2015-12-28 NOTE — Progress Notes (Signed)
Daily Session Note  Patient Details  Name: Lauren House MRN: [id number] Date of Birth: May 19, 1935 Referring Provider:   Flowsheet Row CARDIAC REHAB PHASE II ORIENTATION from 10/18/2015 in [place]  Referring Provider  Dr. Ellyn Hack       Encounter Date: 12/28/2015  Check In:     Session Check In - 12/28/15 0815      Check-In   Location [facility]   Staff Present Suzanne Boron, BS, EP, Exercise Physiologist;Debra Wynetta Emery, RN, BSN   Supervising physician immediately available to respond to emergencies See telemetry face sheet for immediately available MD   Medication changes reported     No   Fall or balance concerns reported    No   Warm-up and Cool-down Performed as group-led instruction   Resistance Training Performed Yes   VAD Patient? No     Pain Assessment   Currently in Pain? No/denies   Pain Score 0-No pain   Multiple Pain Sites No      Capillary Blood Glucose: No results found for this or any previous visit (from the past 24 hour(s)).   Goals Met:  Independence with exercise equipment Exercise tolerated well No report of cardiac concerns or symptoms Strength training completed today  Goals Unmet:  Not Applicable  Comments: Check out 915   Dr. Kate Sable is Medical Director for [place] and Pulmonary Rehab.

## 2015-12-30 ENCOUNTER — Encounter (HOSPITAL_COMMUNITY)
Admission: RE | Admit: 2015-12-30 | Discharge: 2015-12-30 | Disposition: A | Discharge status: Home / Self Care | Payer: Medicare Other | Source: Ambulatory Visit | Attending: Internal Medicine | Admitting: Internal Medicine

## 2015-12-30 DIAGNOSIS — Z955 Presence of coronary angioplasty implant and graft: Secondary | ICD-10-CM

## 2015-12-30 NOTE — Progress Notes (Signed)
Daily Session Note  Patient Details  Name: ROSLIN NORWOOD MRN: [id number] Date of Birth: 12/13/1935 Referring Provider:   Flowsheet Row CARDIAC REHAB PHASE II ORIENTATION from 10/18/2015 in [place]  Referring Provider  Dr. Ellyn Hack       Encounter Date: 12/30/2015  Check In:     Session Check In - 12/30/15 0810      Check-In   Location [facility]   Staff Present Aundra Dubin, RN, BSN;Diane Coad, MS, EP, [facility], Exercise Physiologist;Nathalya Wolanski Luther Parody, BS, EP, Exercise Physiologist   Supervising physician immediately available to respond to emergencies See telemetry face sheet for immediately available MD   Medication changes reported     No   Fall or balance concerns reported    No   Warm-up and Cool-down Performed as group-led instruction   Resistance Training Performed Yes   VAD Patient? No     Pain Assessment   Currently in Pain? No/denies   Pain Score 0-No pain   Multiple Pain Sites No      Capillary Blood Glucose: No results found for this or any previous visit (from the past 24 hour(s)).   Goals Met:  Independence with exercise equipment Exercise tolerated well No report of cardiac concerns or symptoms Strength training completed today  Goals Unmet:  Not Applicable  Comments: Check out 915   Dr. Kate Sable is Medical Director for [place] and Pulmonary Rehab.

## 2016-01-02 ENCOUNTER — Encounter (HOSPITAL_COMMUNITY)
Admission: RE | Admit: 2016-01-02 | Discharge: 2016-01-02 | Disposition: A | Discharge status: Home / Self Care | Payer: Medicare Other | Source: Ambulatory Visit | Attending: Internal Medicine | Admitting: Internal Medicine

## 2016-01-02 DIAGNOSIS — Z955 Presence of coronary angioplasty implant and graft: Secondary | ICD-10-CM | POA: Diagnosis not present

## 2016-01-02 NOTE — Progress Notes (Signed)
Daily Session Note  Patient Details  Name: TANAI BOULER MRN: [id number] Date of Birth: 02-24-1936 Referring Provider:   Flowsheet Row CARDIAC REHAB PHASE II ORIENTATION from 10/18/2015 in [place]  Referring Provider  Dr. Ellyn Hack       Encounter Date: 01/02/2016  Check In:     Session Check In - 01/02/16 0815      Check-In   Location [facility]   Staff Present Shelita Steptoe Angelina Pih, MS, EP, [facility], Exercise Physiologist;Gregory Luther Parody, BS, EP, Exercise Physiologist;Debra Wynetta Emery, RN, BSN   Supervising physician immediately available to respond to emergencies See telemetry face sheet for immediately available MD   Medication changes reported     No   Fall or balance concerns reported    No   Warm-up and Cool-down Performed as group-led instruction   Resistance Training Performed Yes   VAD Patient? No     Pain Assessment   Currently in Pain? No/denies   Pain Score 0-No pain   Multiple Pain Sites No      Capillary Blood Glucose: No results found for this or any previous visit (from the past 24 hour(s)).   Goals Met:  Independence with exercise equipment Exercise tolerated well No report of cardiac concerns or symptoms Strength training completed today  Goals Unmet:  Not Applicable  Comments: Check out: 0915   Dr. Kate Sable is Medical Director for [place] and Pulmonary Rehab.

## 2016-01-04 ENCOUNTER — Encounter (HOSPITAL_COMMUNITY)
Admission: RE | Admit: 2016-01-04 | Discharge: 2016-01-04 | Disposition: A | Discharge status: Home / Self Care | Payer: Medicare Other | Source: Ambulatory Visit | Attending: Internal Medicine | Admitting: Internal Medicine

## 2016-01-04 DIAGNOSIS — Z955 Presence of coronary angioplasty implant and graft: Secondary | ICD-10-CM

## 2016-01-04 NOTE — Progress Notes (Signed)
Daily Session Note  Patient Details  Name: Lauren House MRN: [id number] Date of Birth: 02/27/1935 Referring Provider:   Flowsheet Row CARDIAC REHAB PHASE II ORIENTATION from 10/18/2015 in [place] [facility]  Referring Provider  Dr. Harding       Encounter Date: 01/04/2016  Check In:     Session Check In - 01/04/16 0815      Check-In   Location [facility]   Staff Present Debra Johnson, RN, BSN;Davena Julian, BS, EP, Exercise Physiologist   Supervising physician immediately available to respond to emergencies See telemetry face sheet for immediately available MD   Medication changes reported     No   Fall or balance concerns reported    No   Warm-up and Cool-down Performed as group-led instruction   Resistance Training Performed Yes   VAD Patient? No     Pain Assessment   Currently in Pain? No/denies   Pain Score 0-No pain   Multiple Pain Sites No      Capillary Blood Glucose: No results found for this or any previous visit (from the past 24 hour(s)).      Exercise Prescription Changes - 01/03/16 1200      Exercise Review   Progression Yes     Response to Exercise   Blood Pressure (Admit) 130/82   Blood Pressure (Exercise) 152/80   Blood Pressure (Exit) 130/58   Heart Rate (Admit) 58 bpm   Heart Rate (Exercise) 80 bpm   Heart Rate (Exit) 63 bpm   Rating of Perceived Exertion (Exercise) 9   Duration Progress to 30 minutes of continuous aerobic without signs/symptoms of physical distress   Intensity Rest + 30     Progression   Progression Continue progressive overload as per policy without signs/symptoms or physical distress.     Resistance Training   Training Prescription Yes   Weight 3   Reps 10-12     NuStep   Level 2   Watts 33   Minutes 15   METs 3.44     Arm Ergometer   Level 2.7   Watts 28   Minutes 20   METs 3.1     Home Exercise Plan   Plans to continue exercise at Home   Frequency Add 2 additional days  to program exercise sessions.     Goals Met:  Independence with exercise equipment Exercise tolerated well No report of cardiac concerns or symptoms Strength training completed today  Goals Unmet:  Not Applicable  Comments: Check out 915   Dr. Suresh Koneswaran is Medical Director for [place] [facility]. 

## 2016-01-06 ENCOUNTER — Encounter (HOSPITAL_COMMUNITY)
Admission: RE | Admit: 2016-01-06 | Discharge: 2016-01-06 | Disposition: A | Discharge status: Home / Self Care | Payer: Medicare Other | Source: Ambulatory Visit | Attending: Internal Medicine | Admitting: Internal Medicine

## 2016-01-06 DIAGNOSIS — Z955 Presence of coronary angioplasty implant and graft: Secondary | ICD-10-CM | POA: Diagnosis not present

## 2016-01-06 NOTE — Progress Notes (Signed)
Daily Session Note  Patient Details  Name: Lauren House MRN: [id number] Date of Birth: 08-Jun-1935 Referring Provider:   Flowsheet Row CARDIAC REHAB PHASE II ORIENTATION from 10/18/2015 in [place]  Referring Provider  Dr. Ellyn Hack       Encounter Date: 01/06/2016  Check In:     Session Check In - 01/06/16 0815      Check-In   Location [facility]   Staff Present Aundra Dubin, RN, BSN;Jaydynn Wolford Luther Parody, BS, EP, Exercise Physiologist   Supervising physician immediately available to respond to emergencies See telemetry face sheet for immediately available MD   Medication changes reported     No   Fall or balance concerns reported    No   Warm-up and Cool-down Performed as group-led instruction   Resistance Training Performed Yes   VAD Patient? No     Pain Assessment   Currently in Pain? No/denies   Pain Score 0-No pain   Multiple Pain Sites No      Capillary Blood Glucose: No results found for this or any previous visit (from the past 24 hour(s)).   Goals Met:  Independence with exercise equipment Exercise tolerated well No report of cardiac concerns or symptoms Strength training completed today  Goals Unmet:  Not Applicable  Comments: Check out 915   Dr. Kate Sable is Medical Director for [place] and Pulmonary Rehab.

## 2016-01-09 ENCOUNTER — Encounter (HOSPITAL_COMMUNITY): Payer: Medicare Other

## 2016-01-11 ENCOUNTER — Encounter (HOSPITAL_COMMUNITY): Payer: Medicare Other

## 2016-01-11 NOTE — Progress Notes (Signed)
Cardiac Individual Treatment Plan  Patient Details  Name: Lauren House MRN: [id number] Date of Birth: August 02, 1935 Referring Provider:   Flowsheet Row CARDIAC REHAB PHASE II ORIENTATION from 10/18/2015 in [place]  Referring Provider  Dr. Ellyn Hack       Initial Encounter Date:  Flowsheet Row CARDIAC REHAB PHASE II ORIENTATION from 10/18/2015 in [place]  Date  10/18/15  Referring Provider  Dr. Ellyn Hack       Visit Diagnosis: Stented coronary artery  Patient's Home Medications on Admission:  Current Outpatient Prescriptions:  .  aspirin EC 81 MG tablet, Take 81 mg by mouth daily., Disp: , Rfl:  .  clopidogrel (PLAVIX) 75 MG tablet, Take 75 mg by mouth daily., Disp: , Rfl:  .  Cyanocobalamin (VITAMIN B12 PO), Take 1 tablet by mouth daily., Disp: , Rfl:  .  fluticasone (FLONASE) 50 MCG/ACT nasal spray, USE TWO SPRAY IN EACH NOSTRIL EVERY DAY, Disp: 50 g, Rfl: 1 .  metoprolol tartrate (LOPRESSOR) 25 MG tablet, Take 0.5 tablets (12.5 mg total) by mouth 2 (two) times daily., Disp: 30 tablet, Rfl: 11 .  Multiple Vitamins-Minerals (OCUVITE PO), Take 1 tablet by mouth daily., Disp: , Rfl:  .  nitroGLYCERIN (NITROSTAT) 0.4 MG SL tablet, Place 1 tablet (0.4 mg total) under the tongue every 5 (five) minutes as needed for chest pain., Disp: 25 tablet, Rfl: 3 .  Polyethyl Glycol-Propyl Glycol (SYSTANE OP), Apply 1 drop to eye 3 (three) times daily as needed (Dry eyes)., Disp: , Rfl:  .  ranolazine (RANEXA) 500 MG 12 hr tablet, Take 1 tablet (500 mg total) by mouth 2 (two) times daily., Disp: 180 tablet, Rfl: 3  Past Medical History: Past Medical History:  Diagnosis Date  . Anxiety   . Apical variant hypertrophic cardiomyopathy ([place])   . Arthritis   . Borderline diabetes mellitus   . Bruises easily   . Bursitis of left hip   . Coronary atherosclerosis of native coronary artery    a. 2011: Cath showing nonobstructive CAD - 50-60% LAD (normal  FFR) b. 06/2015: Cath showing 80% stenosis in the Mid LAD, tx w/ Promus DES 2.25 mm x 16 mm   . Essential hypertension, benign   . History of kidney stones   . History of skin cancer    Basal cell - abdomen  . Hyperlipidemia    Diet controlled.cannot take meds  . Nocturia   . Osteoporosis   . Overactive bladder   . Shortness of breath dyspnea   . Vertigo     Tobacco Use: History  Smoking Status  . Never Smoker  Smokeless Tobacco  . Never Used    Labs: Recent Review Flowsheet Data    Labs for ITP Cardiac and Pulmonary Rehab Latest Ref Rng & Units 08/20/2005 09/02/2008 02/10/2010 12/23/2012 11/03/2015   Cholestrol 125 - 200 mg/dL 297 267(H) 249 ATP III CLASSIFICATION: <200     mg/dL   Desirable 200-239  mg/dL   Borderline High >=240    mg/dL   High(H) 287(H) 279(H)   LDLCALC <130 mg/dL 207 177(H) 174 Total Cholesterol/HDL:CHD Risk Coronary Heart Disease Risk Table Men   Women 1/2 Average Risk   3.4   3.3 Average Risk       5.0   4.4 2 X Average Risk   9.6   7.1 3 X Average Risk  23.4   11.0 Use the calculated Patient Ratio above and the CHD Risk Table to determine the patient's  CHD Risk. ATP III CLASSIFICATION (LDL): <100     mg/dL   Optimal 100-129  mg/dL   Near or Above Optimal 130-159  mg/dL   Borderline 160-189  mg/dL   High >190     mg/dL   Very High(H) 166(H) 188(H)   HDL >=46 mg/dL 46 51 36(L) 47 48   Trlycerides <150 mg/dL 222 193(H) 196(H) 368(H) 216(H)   Hemoglobin A1c <5.7 % - - 6.3 (NOTE)                                                                       According to the ADA Clinical Practice Recommendations for 2011, when HbA1c is used as a screening test:   >=6.5%   Diagnostic of Diabetes Mellitus           (if abnormal result is confirmed)  5.7-6.4%   Increased risk of developing Diabetes Mellitus  References:Diagnosis and Classification of Diabetes Mellitus,Diabetes [id number] 1):S62-S69 and Standards of Medical Care in         Diabetes -  2011,Diabetes [id number] (Suppl 1):S11-S61.(H) - -      Capillary Blood Glucose: Lab Results  Component Value Date   GLUCAP 93 10/11/2011   GLUCAP 109 (H) 10/11/2011   GLUCAP 107 (H) 02/10/2010     Exercise Target Goals:    Exercise Program Goal: Individual exercise prescription set with THRR, safety & activity barriers. Participant demonstrates ability to understand and report RPE using BORG scale, to self-measure pulse accurately, and to acknowledge the importance of the exercise prescription.  Exercise Prescription Goal: Starting with aerobic activity 30 plus minutes a day, 3 days per week for initial exercise prescription. Provide home exercise prescription and guidelines that participant acknowledges understanding prior to discharge.  Activity Barriers & Risk Stratification:     Activity Barriers & Cardiac Risk Stratification - 10/18/15 1645      Activity Barriers & Cardiac Risk Stratification   Activity Barriers Joint Problems  due to hip tendon surgery it would be difficult to walk on TM.    Cardiac Risk Stratification High      6 Minute Walk:     6 Minute Walk    Row Name 10/19/15 1407 10/19/15 1408       6 Minute Walk   Phase Initial Initial    Distance  - 1200 feet    Walk Time  - 6 minutes    # of Rest Breaks  - 0    MPH  - 2.27    METS  - 2.74    RPE  - 11    Perceived Dyspnea   - 9    VO2 Peak  - 7.4    Symptoms  - No    Resting HR  - 66 bpm    Resting BP  - 132/72    Max Ex. HR  - 89 bpm    Max Ex. BP  - 174/72    2 Minute Post BP  - 140/74       Initial Exercise Prescription:     Initial Exercise Prescription - 10/19/15 1400      Date of Initial Exercise RX and Referring Provider   Date 10/18/15   Referring Provider Dr. Ellyn Hack  NuStep   Level 2   Watts 15   Minutes 15   METs 1.9     Arm Ergometer   Level 2   Watts 15   Minutes 20   METs 1.9     Prescription Details   Frequency (times per week) 3   Duration  Progress to 30 minutes of continuous aerobic without signs/symptoms of physical distress     Intensity   THRR REST +  30   THRR 40-80% of Max Heartrate 96-112-127   Ratings of Perceived Exertion 11-13   Perceived Dyspnea 0-4     Progression   Progression Continue progressive overload as per policy without signs/symptoms or physical distress.     Resistance Training   Training Prescription Yes   Weight 1   Reps 10-12      Perform Capillary Blood Glucose checks as needed.  Exercise Prescription Changes:      Exercise Prescription Changes    Row Name 11/08/15 1500 12/06/15 0800 01/03/16 1200         Exercise Review   Progression Yes Yes Yes       Response to Exercise   Blood Pressure (Admit) 134/68 124/66 130/82     Blood Pressure (Exercise) 144/74 152/76 152/80     Blood Pressure (Exit) 122/68 126/64 130/58     Heart Rate (Admit) 73 bpm 76 bpm 58 bpm     Heart Rate (Exercise) 80 bpm 88 bpm 80 bpm     Heart Rate (Exit) 74 bpm 81 bpm 63 bpm     Rating of Perceived Exertion (Exercise) 10 9 9      Duration Progress to 30 minutes of continuous aerobic without signs/symptoms of physical distress Progress to 30 minutes of continuous aerobic without signs/symptoms of physical distress Progress to 30 minutes of continuous aerobic without signs/symptoms of physical distress     Intensity Rest + 30 Rest + 30 Rest + 30       Progression   Progression Continue progressive overload as per policy without signs/symptoms or physical distress. Continue progressive overload as per policy without signs/symptoms or physical distress. Continue progressive overload as per policy without signs/symptoms or physical distress.       Resistance Training   Training Prescription Yes Yes Yes     Weight 1 3 3      Reps 10-12 10-12 10-12       NuStep   Level 2 2 2      Watts 18 23 33     Minutes 15 15 15      METs 3.43 3.43 3.44       Arm Ergometer   Level 2 2.6 2.7     Watts 19 23 28       Minutes 20 20 20      METs 2.4 2.8 3.1       Home Exercise Plan   Plans to continue exercise at [facility]     Frequency Add 2 additional days to program exercise sessions. Add 2 additional days to program exercise sessions. Add 2 additional days to program exercise sessions.        Exercise Comments:      Exercise Comments    Row Name 11/08/15 1540 12/06/15 0814 01/03/16 1253       Exercise Comments Patient is proggressing appropriately Patient is progressing well  Patient is proggressing well.         Discharge Exercise Prescription (Final Exercise Prescription Changes):     Exercise Prescription Changes -  01/03/16 1200      Exercise Review   Progression Yes     Response to Exercise   Blood Pressure (Admit) 130/82   Blood Pressure (Exercise) 152/80   Blood Pressure (Exit) 130/58   Heart Rate (Admit) 58 bpm   Heart Rate (Exercise) 80 bpm   Heart Rate (Exit) 63 bpm   Rating of Perceived Exertion (Exercise) 9   Duration Progress to 30 minutes of continuous aerobic without signs/symptoms of physical distress   Intensity Rest + 30     Progression   Progression Continue progressive overload as per policy without signs/symptoms or physical distress.     Resistance Training   Training Prescription Yes   Weight 3   Reps 10-12     NuStep   Level 2   Watts 33   Minutes 15   METs 3.44     Arm Ergometer   Level 2.7   Watts 28   Minutes 20   METs 3.1     Home Exercise Plan   Plans to continue exercise at Home   Frequency Add 2 additional days to program exercise sessions.      Nutrition:  Target Goals: Understanding of nutrition guidelines, daily intake of sodium 1500mg , cholesterol 200mg , calories 30% from fat and 7% or less from saturated fats, daily to have 5 or more servings of fruits and vegetables.  Biometrics:     Pre Biometrics - 10/19/15 1410      Pre Biometrics   Height 5\' 7"  (1.702 m)   Weight 150 lb 5.7 oz (68.2 kg)   Waist  Circumference 36.5 inches   Hip Circumference 36.5 inches   Waist to Hip Ratio 1 %   BMI (Calculated) 23.6   Triceps Skinfold 4 mm   % Body Fat 28 %   Grip Strength 54.7 kg   Flexibility 12.25 in   Single Leg Stand 20 seconds       Nutrition Therapy Plan and Nutrition Goals:   Nutrition Discharge: Rate Your Plate Scores:     Nutrition Assessments - 10/18/15 1659      MEDFICTS Scores   Pre Score 30      Nutrition Goals Re-Evaluation:   Psychosocial: Target Goals: Acknowledge presence or absence of depression, maximize coping skills, provide positive support system. Participant is able to verbalize types and ability to use techniques and skills needed for reducing stress and depression.  Initial Review & Psychosocial Screening:     Initial Psych Review & Screening - 10/18/15 1701      Initial Review   Current issues with Current Sleep Concerns     Family Dynamics   Good Support System? Yes     Barriers   Psychosocial barriers to participate in program There are no identifiable barriers or psychosocial needs.     Screening Interventions   Interventions Encouraged to exercise      Quality of Life Scores:     Quality of Life - 10/19/15 1412      Quality of Life Scores   Health/Function Pre 18 %   Socioeconomic Pre 22.5 %   Psych/Spiritual Pre 24.58 %   Family Pre 30 %   GLOBAL Pre 22.59 %      PHQ-9: Recent Review Flowsheet Data    Depression screen [facility] 2/9 10/18/2015   Decreased Interest 0   Down, Depressed, Hopeless 0   PHQ - 2 Score 0   Altered sleeping 1   Tired, decreased energy 0   Change  in appetite 1   Feeling bad or failure about yourself  0   Trouble concentrating 0   Moving slowly or fidgety/restless 0   Suicidal thoughts 0   PHQ-9 Score 2   Difficult doing work/chores Somewhat difficult      Psychosocial Evaluation and Intervention:     Psychosocial Evaluation - 10/18/15 1702      Psychosocial Evaluation & Interventions    Interventions Encouraged to exercise with the program and follow exercise prescription   Continued Psychosocial Services Needed No      Psychosocial Re-Evaluation:     Psychosocial Re-Evaluation    [place] Name 11/11/15 1432 12/07/15 1457 01/11/16 1315         Psychosocial Re-Evaluation   Interventions Encouraged to attend Cardiac Rehabilitation for the exercise Encouraged to attend Cardiac Rehabilitation for the exercise Encouraged to attend Cardiac Rehabilitation for the exercise     Comments Patient's QOL score was 22.59 and her PHQ-9 score was 2. She is not depressed and does not need counseling.  Patient's QOL score was 22.59 and PHQ-9 was 2. She is not depressed and does not need counseling.  Patient continues to have no psychosocial issues idenified.      Continued Psychosocial Services Needed  - No No        Vocational Rehabilitation: Provide vocational rehab assistance to qualifying candidates.   Vocational Rehab Evaluation & Intervention:     Vocational Rehab - 10/18/15 1657      Initial Vocational Rehab Evaluation & Intervention   Assessment shows need for Vocational Rehabilitation No      Education: Education Goals: Education classes will be provided on a weekly basis, covering required topics. Participant will state understanding/return demonstration of topics presented.  Learning Barriers/Preferences:     Learning Barriers/Preferences - 10/18/15 1647      Learning Barriers/Preferences   Learning Barriers None   Learning Preferences Video;Verbal Instruction;Pictoral;Written Material      Education Topics: Hypertension, Hypertension Reduction -Define heart disease and high blood pressure. Discus how high blood pressure affects the body and ways to reduce high blood pressure. Flowsheet Row CARDIAC REHAB PHASE II EXERCISE from 01/04/2016 in [place]  Date  12/28/15  Educator  DC  Instruction Review Code  2- meets goals/outcomes       Exercise and Your Heart -Discuss why it is important to exercise, the FITT principles of exercise, normal and abnormal responses to exercise, and how to exercise safely. Flowsheet Row CARDIAC REHAB PHASE II EXERCISE from 01/04/2016 in [place]  Date  01/04/16  Educator  Russella Dar  Instruction Review Code  2- meets goals/outcomes      Angina -Discuss definition of angina, causes of angina, treatment of angina, and how to decrease risk of having angina.   Cardiac Medications -Review what the following cardiac medications are used for, how they affect the body, and side effects that may occur when taking the medications.  Medications include Aspirin, Beta blockers, calcium channel blockers, ACE Inhibitors, angiotensin receptor blockers, diuretics, digoxin, and antihyperlipidemics.   Congestive Heart Failure -Discuss the definition of CHF, how to live with CHF, the signs and symptoms of CHF, and how keep track of weight and sodium intake.   Heart Disease and Intimacy -Discus the effect sexual activity has on the heart, how changes occur during intimacy as we age, and safety during sexual activity. Flowsheet Row CARDIAC REHAB PHASE II EXERCISE from 01/04/2016 in [place]  Date  11/02/15  Educator  DJ  Instruction Review Code  2- meets goals/outcomes      Smoking Cessation / COPD -Discuss different methods to quit smoking, the health benefits of quitting smoking, and the definition of COPD. Flowsheet Row CARDIAC REHAB PHASE II EXERCISE from 01/04/2016 in [place]  Date  11/09/15  Educator  DC  Instruction Review Code  2- meets goals/outcomes      Nutrition I: Fats -Discuss the types of cholesterol, what cholesterol does to the heart, and how cholesterol levels can be controlled. Flowsheet Row CARDIAC REHAB PHASE II EXERCISE from 01/04/2016 in [place]  Date  11/16/15  Educator   Russella Dar  Instruction Review Code  2- meets goals/outcomes      Nutrition II: Labels -Discuss the different components of food labels and how to read food label [place] from 01/04/2016 in [place]  Date  11/23/15  Educator  Russella Dar  Instruction Review Code  2- meets goals/outcomes      Heart Parts and Heart Disease -Discuss the anatomy of the heart, the pathway of blood circulation through the heart, and these are affected by heart disease. Flowsheet Row CARDIAC REHAB PHASE II EXERCISE from 01/04/2016 in [place]  Date  11/30/15  Educator  DC  Instruction Review Code  2- meets goals/outcomes      Stress I: Signs and Symptoms -Discuss the causes of stress, how stress may lead to anxiety and depression, and ways to limit stress. Flowsheet Row CARDIAC REHAB PHASE II EXERCISE from 01/04/2016 in [place]  Date  12/07/15  Educator  Russella Dar  Instruction Review Code  2- meets goals/outcomes      Stress II: Relaxation -Discuss different types of relaxation techniques to limit stress. Flowsheet Row CARDIAC REHAB PHASE II EXERCISE from 01/04/2016 in [place]  Date  12/14/15  Educator  Russella Dar  Instruction Review Code  2- meets goals/outcomes      Warning Signs of Stroke / TIA -Discuss definition of a stroke, what the signs and symptoms are of a stroke, and how to identify when someone is having stroke. Flowsheet Row CARDIAC REHAB PHASE II EXERCISE from 01/04/2016 in [place]  Date  12/21/15  Educator  DJ  Instruction Review Code  2- meets goals/outcomes      Knowledge Questionnaire Score:     Knowledge Questionnaire Score - 10/18/15 1657      Knowledge Questionnaire Score   Pre Score 20/24      Core Components/Risk Factors/Patient Goals at Admission:     Personal Goals and Risk Factors at Admission  - 10/18/15 1659      Core Components/Risk Factors/Patient Goals on Admission    Weight Management Weight Maintenance  Patient's BMI WNL   Sedentary Yes   Intervention Provide advice, education, support and counseling about physical activity/exercise needs.;Develop an individualized exercise prescription for aerobic and resistive training based on initial evaluation findings, risk stratification, comorbidities and participant's personal goals.   Expected Outcomes Achievement of increased cardiorespiratory fitness and enhanced flexibility, muscular endurance and strength shown through measurements of functional capacity and personal statement of participant.   Increase Strength and Stamina Yes   Intervention Provide advice, education, support and counseling about physical activity/exercise needs.;Develop an individualized exercise prescription for aerobic and resistive training based on initial evaluation findings, risk stratification, comorbidities and participant's personal goals.   Expected Outcomes Achievement of  increased cardiorespiratory fitness and enhanced flexibility, muscular endurance and strength shown through measurements of functional capacity and personal statement of participant.   Improve shortness of breath with ADL's Yes   Intervention Provide education, individualized exercise plan and daily activity instruction to help decrease symptoms of SOB with activities of daily living.   Expected Outcomes Short Term: Achieves a reduction of symptoms when performing activities of daily living.   Personal Goal Other Yes   Personal Goal Gain strength and stamina, Do more activities w/o SOB   Intervention Attend CR 3 times week and supplement 2 days week at home.    Expected Outcomes To achieve personal goals.       Core Components/Risk Factors/Patient Goals Review:      Goals and Risk Factor Review    Row Name 10/18/15 1701 11/11/15 1430 12/07/15 1455 01/11/16 1313       Core  Components/Risk Factors/Patient Goals Review   Personal Goals Review Increase Strength and Stamina;Improve shortness of breath with ADL's Weight Management/Obesity;Increase Strength and Stamina;Improve shortness of breath with ADL's Weight Management/Obesity;Increase Strength and Stamina Weight Management/Obesity    Review  - After 6 sessions, patient has lost 0.8 lbs. She feels like the program has helped her with some improvement with SOB and strength.  After 16 sessions, patient has maintained her weight. She feels like the program is helping her with improvement with SOB, strength and stamina. She is doing well in the program.  Patient has attended 28 sessions. She continues to maintain her weight. Her strength and stamina continue to increase. She is doing well in the program.     Expected Outcomes  - Patient will continue to attend program increasing her strength and stamina and improved SOB.  Patient will continue to attend sessions and complete the program maintaining her weight with improved SOB, strength and stamina.  Patient will complete the program meeting her personal goals.        Core Components/Risk Factors/Patient Goals at Discharge (Final Review):      Goals and Risk Factor Review - 01/11/16 1313      Core Components/Risk Factors/Patient Goals Review   Personal Goals Review Weight Management/Obesity   Review Patient has attended 28 sessions. She continues to maintain her weight. Her strength and stamina continue to increase. She is doing well in the program.    Expected Outcomes Patient will complete the program meeting her personal goals.       ITP Comments:   Comments: ITP 30 Day REVIEW Patient doing well in the program. Will continue to monitor for progress.

## 2016-01-13 ENCOUNTER — Encounter (HOSPITAL_COMMUNITY)
Admission: RE | Admit: 2016-01-13 | Discharge: 2016-01-13 | Disposition: A | Discharge status: Home / Self Care | Payer: Medicare Other | Source: Ambulatory Visit | Attending: Internal Medicine | Admitting: Internal Medicine

## 2016-01-13 DIAGNOSIS — Z955 Presence of coronary angioplasty implant and graft: Secondary | ICD-10-CM | POA: Diagnosis not present

## 2016-01-13 NOTE — Progress Notes (Signed)
Daily Session Note  Patient Details  Name: Lauren House MRN: [id number] Date of Birth: 1935/08/16 Referring Provider:   Flowsheet Row CARDIAC REHAB PHASE II ORIENTATION from 10/18/2015 in [place]  Referring Provider  Dr. Ellyn Hack       Encounter Date: 01/13/2016  Check In:   Capillary Blood Glucose: No results found for this or any previous visit (from the past 24 hour(s)).   Goals Met:  Independence with exercise equipment Exercise tolerated well No report of cardiac concerns or symptoms Strength training completed today  Goals Unmet:  Not Applicable  Comments: Check out: 0930   Dr. Kate Sable is Medical Director for [place] and Pulmonary Rehab.

## 2016-01-16 ENCOUNTER — Encounter (HOSPITAL_COMMUNITY)
Admission: RE | Admit: 2016-01-16 | Discharge: 2016-01-16 | Disposition: A | Discharge status: Home / Self Care | Payer: Medicare Other | Source: Ambulatory Visit | Attending: Internal Medicine | Admitting: Internal Medicine

## 2016-01-16 DIAGNOSIS — Z955 Presence of coronary angioplasty implant and graft: Secondary | ICD-10-CM

## 2016-01-16 NOTE — Progress Notes (Signed)
Daily Session Note  Patient Details  Name: Lauren House MRN: [id number] Date of Birth: 28-Jan-1936 Referring Provider:   Flowsheet Row CARDIAC REHAB PHASE II ORIENTATION from 10/18/2015 in [place]  Referring Provider  Dr. Ellyn Hack       Encounter Date: 01/16/2016  Check In:     Session Check In - 01/16/16 0809      Check-In   Location [facility]   Staff Present Aundra Dubin, RN, BSN;[place] Marinello Luther Parody, BS, EP, Exercise Physiologist   Supervising physician immediately available to respond to emergencies See telemetry face sheet for immediately available MD   Medication changes reported     No   Fall or balance concerns reported    No   Warm-up and Cool-down Performed as group-led instruction   Resistance Training Performed Yes   VAD Patient? No     Pain Assessment   Currently in Pain? No/denies   Pain Score 0-No pain   Multiple Pain Sites No      Capillary Blood Glucose: No results found for this or any previous visit (from the past 24 hour(s)).   Goals Met:  Independence with exercise equipment Exercise tolerated well No report of cardiac concerns or symptoms Strength training completed today  Goals Unmet:  Not Applicable  Comments: Check out 915   Dr. Kate Sable is Medical Director for [place] and Pulmonary Rehab.

## 2016-01-18 ENCOUNTER — Encounter (HOSPITAL_COMMUNITY)
Admission: RE | Admit: 2016-01-18 | Discharge: 2016-01-18 | Disposition: A | Discharge status: Home / Self Care | Payer: Medicare Other | Source: Ambulatory Visit | Attending: Internal Medicine | Admitting: Internal Medicine

## 2016-01-18 DIAGNOSIS — Z955 Presence of coronary angioplasty implant and graft: Secondary | ICD-10-CM

## 2016-01-18 NOTE — Progress Notes (Signed)
Daily Session Note  Patient Details  Name: Lauren House MRN: [id number] Date of Birth: Apr 30, 1935 Referring Provider:   Flowsheet Row CARDIAC REHAB PHASE II ORIENTATION from 10/18/2015 in [place]  Referring Provider  Dr. Ellyn Hack       Encounter Date: 01/18/2016  Check In:     Session Check In - 01/18/16 0815      Check-In   Location [facility]   Staff Present Aundra Dubin, RN, BSN;Diane Coad, MS, EP, [facility], Exercise Physiologist;Landen Knoedler Luther Parody, BS, EP, Exercise Physiologist   Supervising physician immediately available to respond to emergencies See telemetry face sheet for immediately available MD   Medication changes reported     No   Fall or balance concerns reported    No   Warm-up and Cool-down Performed as group-led instruction   Resistance Training Performed Yes   VAD Patient? No     Pain Assessment   Currently in Pain? No/denies   Pain Score 0-No pain   Multiple Pain Sites No      Capillary Blood Glucose: No results found for this or any previous visit (from the past 24 hour(s)).   Goals Met:  Independence with exercise equipment Exercise tolerated well No report of cardiac concerns or symptoms Strength training completed today  Goals Unmet:  Not Applicable  Comments: Check out 915   Dr. Kate Sable is Medical Director for [place] and Pulmonary Rehab.

## 2016-01-20 ENCOUNTER — Encounter (HOSPITAL_COMMUNITY): Payer: Medicare Other

## 2016-01-23 ENCOUNTER — Encounter (HOSPITAL_COMMUNITY)
Admission: RE | Admit: 2016-01-23 | Discharge: 2016-01-23 | Disposition: A | Discharge status: Home / Self Care | Payer: Medicare Other | Source: Ambulatory Visit | Attending: Internal Medicine | Admitting: Internal Medicine

## 2016-01-23 DIAGNOSIS — Z955 Presence of coronary angioplasty implant and graft: Secondary | ICD-10-CM

## 2016-01-23 NOTE — Progress Notes (Signed)
Daily Session Note  Patient Details  Name: MYLIAH MEDEL MRN: [id number] Date of Birth: [birth year] Referring Provider:   Flowsheet Row CARDIAC REHAB PHASE II ORIENTATION from 10/18/2015 in [place]  Referring Provider  Dr. Ellyn Hack       Encounter Date: 01/23/2016  Check In:     Session Check In - 01/23/16 0812      Check-In   Location [facility]   Staff Present Aundra Dubin, RN, BSN;Gladyes Kudo Luther Parody, BS, EP, Exercise Physiologist   Supervising physician immediately available to respond to emergencies See telemetry face sheet for immediately available MD   Medication changes reported     No   Fall or balance concerns reported    No   Warm-up and Cool-down Performed as group-led instruction   Resistance Training Performed Yes   VAD Patient? No     Pain Assessment   Currently in Pain? No/denies   Pain Score 0-No pain   Multiple Pain Sites No      Capillary Blood Glucose: No results found for this or any previous visit (from the past 24 hour(s)).   Goals Met:  Independence with exercise equipment Exercise tolerated well No report of cardiac concerns or symptoms Strength training completed today  Goals Unmet:  Not Applicable  Comments: Check out 915   Dr. Kate Sable is Medical Director for [place] and [facility].

## 2016-01-25 ENCOUNTER — Encounter (HOSPITAL_COMMUNITY)
Admission: RE | Admit: 2016-01-25 | Discharge: 2016-01-25 | Disposition: A | Discharge status: Home / Self Care | Payer: Medicare Other | Source: Ambulatory Visit | Attending: Cardiology | Admitting: Cardiology

## 2016-01-25 DIAGNOSIS — Z955 Presence of coronary angioplasty implant and graft: Secondary | ICD-10-CM | POA: Diagnosis not present

## 2016-01-26 DIAGNOSIS — L821 Other seborrheic keratosis: Secondary | ICD-10-CM | POA: Diagnosis not present

## 2016-01-27 ENCOUNTER — Encounter (HOSPITAL_COMMUNITY)
Admission: RE | Admit: 2016-01-27 | Discharge: 2016-01-27 | Disposition: A | Discharge status: Home / Self Care | Payer: Medicare Other | Source: Ambulatory Visit | Attending: Cardiology | Admitting: Cardiology

## 2016-01-27 DIAGNOSIS — Z955 Presence of coronary angioplasty implant and graft: Secondary | ICD-10-CM | POA: Diagnosis not present

## 2016-01-27 NOTE — Progress Notes (Signed)
Daily Session Note  Patient Details  Name: ABIGIAL NEWVILLE MRN: [id number] Date of Birth: [birth year] Referring Provider:   Flowsheet Row CARDIAC REHAB PHASE II ORIENTATION from 10/18/2015 in [place]  Referring Provider  Dr. Ellyn Hack       Encounter Date: 01/27/2016  Check In:     Session Check In - 01/27/16 0805      Check-In   Location [facility]   Staff Present Suzanne Boron, BS, EP, Exercise Physiologist;Debra Wynetta Emery, RN, BSN   Supervising physician immediately available to respond to emergencies See telemetry face sheet for immediately available MD   Medication changes reported     No   Fall or balance concerns reported    No   Warm-up and Cool-down Performed as group-led instruction   Resistance Training Performed Yes   VAD Patient? No     Pain Assessment   Currently in Pain? No/denies   Pain Score 0-No pain   Multiple Pain Sites No      Capillary Blood Glucose: No results found for this or any previous visit (from the past 24 hour(s)).   Goals Met:  Independence with exercise equipment Exercise tolerated well No report of cardiac concerns or symptoms Strength training completed today  Goals Unmet:  Not Applicable  Comments: Check out 915   Dr. Kate Sable is Medical Director for [place] and [facility].

## 2016-01-30 ENCOUNTER — Encounter (HOSPITAL_COMMUNITY)
Admission: RE | Admit: 2016-01-30 | Discharge: 2016-01-30 | Disposition: A | Discharge status: Home / Self Care | Payer: Medicare Other | Source: Ambulatory Visit | Attending: Internal Medicine | Admitting: Internal Medicine

## 2016-01-30 DIAGNOSIS — Z955 Presence of coronary angioplasty implant and graft: Secondary | ICD-10-CM

## 2016-01-30 NOTE — Progress Notes (Signed)
Daily Session Note  Patient Details  Name: Lauren House MRN: [id number] Date of Birth: May 30, 1935 Referring Provider:   Flowsheet Row CARDIAC REHAB PHASE II ORIENTATION from 10/18/2015 in [place]  Referring Provider  Dr. Ellyn Hack       Encounter Date: 01/30/2016  Check In:     Session Check In - 01/30/16 0815      Check-In   Location [facility]   Staff Present Suzanne Boron, BS, EP, Exercise Physiologist;Debra Wynetta Emery, RN, BSN   Supervising physician immediately available to respond to emergencies See telemetry face sheet for immediately available MD   Medication changes reported     No   Fall or balance concerns reported    No   Warm-up and Cool-down Performed as group-led instruction   Resistance Training Performed Yes   VAD Patient? No     Pain Assessment   Currently in Pain? No/denies   Pain Score 0-No pain   Multiple Pain Sites No      Capillary Blood Glucose: No results found for this or any previous visit (from the past 24 hour(s)).   Goals Met:  Independence with exercise equipment Exercise tolerated well No report of cardiac concerns or symptoms Strength training completed today  Goals Unmet:  Not Applicable  Comments: Check out 915   Dr. Kate Sable is Medical Director for [place] and [facility].

## 2016-02-01 ENCOUNTER — Encounter (HOSPITAL_COMMUNITY)
Admission: RE | Admit: 2016-02-01 | Discharge: 2016-02-01 | Disposition: A | Discharge status: Home / Self Care | Payer: Medicare Other | Source: Ambulatory Visit | Attending: Internal Medicine | Admitting: Internal Medicine

## 2016-02-01 VITALS — Ht 67.0 in | Wt 149.3 lb

## 2016-02-01 DIAGNOSIS — Z955 Presence of coronary angioplasty implant and graft: Secondary | ICD-10-CM

## 2016-02-01 NOTE — Progress Notes (Signed)
Cardiac Individual Treatment Plan  Patient Details  Name: Lauren House MRN: [id number] Date of Birth: August 02, 1935 Referring Provider:   Flowsheet Row CARDIAC REHAB PHASE II ORIENTATION from 10/18/2015 in [place]  Referring Provider  Dr. Ellyn Hack       Initial Encounter Date:  Flowsheet Row CARDIAC REHAB PHASE II ORIENTATION from 10/18/2015 in [place]  Date  10/18/15  Referring Provider  Dr. Ellyn Hack       Visit Diagnosis: Stented coronary artery  Patient's Home Medications on Admission:  Current Outpatient Prescriptions:  .  aspirin EC 81 MG tablet, Take 81 mg by mouth daily., Disp: , Rfl:  .  clopidogrel (PLAVIX) 75 MG tablet, Take 75 mg by mouth daily., Disp: , Rfl:  .  Cyanocobalamin (VITAMIN B12 PO), Take 1 tablet by mouth daily., Disp: , Rfl:  .  fluticasone (FLONASE) 50 MCG/ACT nasal spray, USE TWO SPRAY IN EACH NOSTRIL EVERY DAY, Disp: 50 g, Rfl: 1 .  metoprolol tartrate (LOPRESSOR) 25 MG tablet, Take 0.5 tablets (12.5 mg total) by mouth 2 (two) times daily., Disp: 30 tablet, Rfl: 11 .  Multiple Vitamins-Minerals (OCUVITE PO), Take 1 tablet by mouth daily., Disp: , Rfl:  .  nitroGLYCERIN (NITROSTAT) 0.4 MG SL tablet, Place 1 tablet (0.4 mg total) under the tongue every 5 (five) minutes as needed for chest pain., Disp: 25 tablet, Rfl: 3 .  Polyethyl Glycol-Propyl Glycol (SYSTANE OP), Apply 1 drop to eye 3 (three) times daily as needed (Dry eyes)., Disp: , Rfl:  .  ranolazine (RANEXA) 500 MG 12 hr tablet, Take 1 tablet (500 mg total) by mouth 2 (two) times daily., Disp: 180 tablet, Rfl: 3  Past Medical History: Past Medical History:  Diagnosis Date  . Anxiety   . Apical variant hypertrophic cardiomyopathy ([place])   . Arthritis   . Borderline diabetes mellitus   . Bruises easily   . Bursitis of left hip   . Coronary atherosclerosis of native coronary artery    a. 2011: Cath showing nonobstructive CAD - 50-60% LAD (normal  FFR) b. 06/2015: Cath showing 80% stenosis in the Mid LAD, tx w/ Promus DES 2.25 mm x 16 mm   . Essential hypertension, benign   . History of kidney stones   . History of skin cancer    Basal cell - abdomen  . Hyperlipidemia    Diet controlled.cannot take meds  . Nocturia   . Osteoporosis   . Overactive bladder   . Shortness of breath dyspnea   . Vertigo     Tobacco Use: History  Smoking Status  . Never Smoker  Smokeless Tobacco  . Never Used    Labs: Recent Review Flowsheet Data    Labs for ITP Cardiac and Pulmonary Rehab Latest Ref Rng & Units 08/20/2005 09/02/2008 02/10/2010 12/23/2012 11/03/2015   Cholestrol 125 - 200 mg/dL 297 267(H) 249 ATP III CLASSIFICATION: <200     mg/dL   Desirable 200-239  mg/dL   Borderline High >=240    mg/dL   High(H) 287(H) 279(H)   LDLCALC <130 mg/dL 207 177(H) 174 Total Cholesterol/HDL:CHD Risk Coronary Heart Disease Risk Table Men   Women 1/2 Average Risk   3.4   3.3 Average Risk       5.0   4.4 2 X Average Risk   9.6   7.1 3 X Average Risk  23.4   11.0 Use the calculated Patient Ratio above and the CHD Risk Table to determine the patient's  CHD Risk. ATP III CLASSIFICATION (LDL): <100     mg/dL   Optimal 379-909  mg/dL   Near or Above Optimal 130-159  mg/dL   Borderline 400-050  mg/dL   High >567     mg/dL   Very High(H) 889(B) 388(U)   HDL >=46 mg/dL 46 51 66(U) 47 48   Trlycerides <150 mg/dL 648 616(R) 224(W) 018(Y) 216(H)   Hemoglobin A1c <5.7 % - - 6.3 (NOTE)                                                                       According to the ADA Clinical Practice Recommendations for 2011, when HbA1c is used as a screening test:   >=6.5%   Diagnostic of Diabetes Mellitus           (if abnormal result is confirmed)  5.7-6.4%   Increased risk of developing Diabetes Mellitus  References:Diagnosis and Classification of Diabetes Mellitus,Diabetes Care,2011,34(Suppl 1):S62-S69 and Standards of Medical Care in         Diabetes -  2011,Diabetes Care,2011,34 (Suppl 1):S11-S61.(H) - -      Capillary Blood Glucose: Lab Results  Component Value Date   GLUCAP 93 10/11/2011   GLUCAP 109 (H) 10/11/2011   GLUCAP 107 (H) 02/10/2010     Exercise Target Goals:    Exercise Program Goal: Individual exercise prescription set with THRR, safety & activity barriers. Participant demonstrates ability to understand and report RPE using BORG scale, to self-measure pulse accurately, and to acknowledge the importance of the exercise prescription.  Exercise Prescription Goal: Starting with aerobic activity 30 plus minutes a day, 3 days per week for initial exercise prescription. Provide home exercise prescription and guidelines that participant acknowledges understanding prior to discharge.  Activity Barriers & Risk Stratification:     Activity Barriers & Cardiac Risk Stratification - 10/18/15 1645      Activity Barriers & Cardiac Risk Stratification   Activity Barriers Joint Problems  due to hip tendon surgery it would be difficult to walk on TM.    Cardiac Risk Stratification High      6 Minute Walk:     6 Minute Walk    Row Name 10/19/15 1407 10/19/15 1408 02/01/16 1443     6 Minute Walk   Phase Initial Initial Discharge   Distance  - 1200 feet 1300 feet   Distance % Change  -  - 8.33 %   Walk Time  - 6 minutes 6 minutes   # of Rest Breaks  - 0 0   MPH  - 2.27 2.46   METS  - 2.74 2.88   RPE  - 11 13   Perceived Dyspnea   - 9 13   VO2 Peak  - 7.4 9.33   Symptoms  - No No   Resting HR  - 66 bpm 69 bpm   Resting BP  - 132/72 142/74   Max Ex. HR  - 89 bpm 81 bpm   Max Ex. BP  - 174/72 156/76   2 Minute Post BP  - 140/74 144/76      Initial Exercise Prescription:     Initial Exercise Prescription - 10/19/15 1400      Date of Initial Exercise RX  and Referring Provider   Date 10/18/15   Referring Provider Dr. Lorin Glass   Level 2   Watts 15   Minutes 15   METs 1.9     Arm Ergometer    Level 2   Watts 15   Minutes 20   METs 1.9     Prescription Details   Frequency (times per week) 3   Duration Progress to 30 minutes of continuous aerobic without signs/symptoms of physical distress     Intensity   THRR REST +  30   THRR 40-80% of Max Heartrate 96-112-127   Ratings of Perceived Exertion 11-13   Perceived Dyspnea 0-4     Progression   Progression Continue progressive overload as per policy without signs/symptoms or physical distress.     Resistance Training   Training Prescription Yes   Weight 1   Reps 10-12      Perform Capillary Blood Glucose checks as needed.  Exercise Prescription Changes:      Exercise Prescription Changes    Row Name 11/08/15 1500 12/06/15 0800 01/03/16 1200         Exercise Review   Progression Yes Yes Yes       Response to Exercise   Blood Pressure (Admit) 134/68 124/66 130/82     Blood Pressure (Exercise) 144/74 152/76 152/80     Blood Pressure (Exit) 122/68 126/64 130/58     Heart Rate (Admit) 73 bpm 76 bpm 58 bpm     Heart Rate (Exercise) 80 bpm 88 bpm 80 bpm     Heart Rate (Exit) 74 bpm 81 bpm 63 bpm     Rating of Perceived Exertion (Exercise) '10 9 9     '$ Duration Progress to 30 minutes of continuous aerobic without signs/symptoms of physical distress Progress to 30 minutes of continuous aerobic without signs/symptoms of physical distress Progress to 30 minutes of continuous aerobic without signs/symptoms of physical distress     Intensity Rest + 30 Rest + 30 Rest + 30       Progression   Progression Continue progressive overload as per policy without signs/symptoms or physical distress. Continue progressive overload as per policy without signs/symptoms or physical distress. Continue progressive overload as per policy without signs/symptoms or physical distress.       Resistance Training   Training Prescription Yes Yes Yes     Weight '1 3 3     '$ Reps 10-12 10-12 10-12       NuStep   Level '2 2 2     '$ Watts 18 23 33      Minutes '15 15 15     '$ METs 3.43 3.43 3.44       Arm Ergometer   Level 2 2.6 2.7     Watts '19 23 28     '$ Minutes '20 20 20     '$ METs 2.4 2.8 3.1       Home Exercise Plan   Plans to continue exercise at [facility]     Frequency Add 2 additional days to program exercise sessions. Add 2 additional days to program exercise sessions. Add 2 additional days to program exercise sessions.        Exercise Comments:      Exercise Comments    Row Name 11/08/15 1540 12/06/15 0814 01/03/16 1253       Exercise Comments Patient is proggressing appropriately Patient is progressing well  Patient is proggressing well.  Discharge Exercise Prescription (Final Exercise Prescription Changes):     Exercise Prescription Changes - 01/03/16 1200      Exercise Review   Progression Yes     Response to Exercise   Blood Pressure (Admit) 130/82   Blood Pressure (Exercise) 152/80   Blood Pressure (Exit) 130/58   Heart Rate (Admit) 58 bpm   Heart Rate (Exercise) 80 bpm   Heart Rate (Exit) 63 bpm   Rating of Perceived Exertion (Exercise) 9   Duration Progress to 30 minutes of continuous aerobic without signs/symptoms of physical distress   Intensity Rest + 30     Progression   Progression Continue progressive overload as per policy without signs/symptoms or physical distress.     Resistance Training   Training Prescription Yes   Weight 3   Reps 10-12     NuStep   Level 2   Watts 33   Minutes 15   METs 3.44     Arm Ergometer   Level 2.7   Watts 28   Minutes 20   METs 3.1     Home Exercise Plan   Plans to continue exercise at Home   Frequency Add 2 additional days to program exercise sessions.      Nutrition:  Target Goals: Understanding of nutrition guidelines, daily intake of sodium '1500mg'$ , cholesterol '200mg'$ , calories 30% from fat and 7% or less from saturated fats, daily to have 5 or more servings of fruits and vegetables.  Biometrics:     Pre Biometrics -  10/19/15 1410      Pre Biometrics   Height '5\' 7"'$  (1.702 m)   Weight 150 lb 5.7 oz (68.2 kg)   Waist Circumference 36.5 inches   Hip Circumference 36.5 inches   Waist to Hip Ratio 1 %   BMI (Calculated) 23.6   Triceps Skinfold 4 mm   % Body Fat 28 %   Grip Strength 54.7 kg   Flexibility 12.25 in   Single Leg Stand 20 seconds         Post Biometrics - 02/01/16 1444       Post  Biometrics   Height '5\' 7"'$  (1.702 m)   Weight 149 lb 4.7 oz (67.7 kg)   Waist Circumference 35 inches   Hip Circumference 37 inches   Waist to Hip Ratio 0.95 %   BMI (Calculated) 23.4   Triceps Skinfold 4 mm   % Body Fat 27.4 %   Grip Strength 50 kg   Flexibility 14.33 in   Single Leg Stand 5 seconds      Nutrition Therapy Plan and Nutrition Goals:   Nutrition Discharge: Rate Your Plate Scores:     Nutrition Assessments - 02/01/16 1507      MEDFICTS Scores   Pre Score 30   Post Score 24   Score Difference -6      Nutrition Goals Re-Evaluation:   Psychosocial: Target Goals: Acknowledge presence or absence of depression, maximize coping skills, provide positive support system. Participant is able to verbalize types and ability to use techniques and skills needed for reducing stress and depression.  Initial Review & Psychosocial Screening:     Initial Psych Review & Screening - 10/18/15 1701      Initial Review   Current issues with Current Sleep Concerns     Family Dynamics   Good Support System? Yes     Barriers   Psychosocial barriers to participate in program There are no identifiable barriers or psychosocial needs.  Screening Interventions   Interventions Encouraged to exercise      Quality of Life Scores:     Quality of Life - 02/01/16 1445      Quality of Life Scores   Health/Function Pre 18 %   Health/Function Post 19.6 %   Health/Function % Change 8.89 %   Socioeconomic Pre 22.5 %   Socioeconomic Post 23 %   Socioeconomic % Change  2.22 %    Psych/Spiritual Pre 24.58 %   Psych/Spiritual Post 24.38 %   Psych/Spiritual % Change -0.81 %   Family Pre 30 %   Family Post 30 %   Family % Change 0 %   GLOBAL Pre 22.59 %   GLOBAL Post 22.24 %   GLOBAL % Change -1.55 %      PHQ-9: Recent Review Flowsheet Data    Depression screen [facility] 2/9 02/01/2016 10/18/2015   Decreased Interest 0 0   Down, Depressed, Hopeless 0 0   PHQ - 2 Score 0 0   Altered sleeping 1 1   Tired, decreased energy 1 0   Change in appetite 1 1   Feeling bad or failure about yourself  0 0   Trouble concentrating 0 0   Moving slowly or fidgety/restless 0 0   Suicidal thoughts 0 0   PHQ-9 Score 3 2   Difficult doing work/chores Not difficult at all Somewhat difficult      Psychosocial Evaluation and Intervention:     Psychosocial Evaluation - 02/01/16 1512      Discharge Psychosocial Assessment & Intervention   Discharge Continue support measures as needed   Comments Patient's QOL score decreased by 1.55 and her PHQ-9 score increased by 1. Patient has no psychosocial issues identified at discharge.       Psychosocial Re-Evaluation:     Psychosocial Re-Evaluation    [place] Name 11/11/15 1432 12/07/15 1457 01/11/16 1315         Psychosocial Re-Evaluation   Interventions Encouraged to attend Cardiac Rehabilitation for the exercise Encouraged to attend Cardiac Rehabilitation for the exercise Encouraged to attend Cardiac Rehabilitation for the exercise     Comments Patient's QOL score was 22.59 and her PHQ-9 score was 2. She is not depressed and does not need counseling.  Patient's QOL score was 22.59 and PHQ-9 was 2. She is not depressed and does not need counseling.  Patient continues to have no psychosocial issues idenified.      Continued Psychosocial Services Needed  - No No        Vocational Rehabilitation: Provide vocational rehab assistance to qualifying candidates.   Vocational Rehab Evaluation & Intervention:     Vocational Rehab -  10/18/15 1657      Initial Vocational Rehab Evaluation & Intervention   Assessment shows need for Vocational Rehabilitation No      Education: Education Goals: Education classes will be provided on a weekly basis, covering required topics. Participant will state understanding/return demonstration of topics presented.  Learning Barriers/Preferences:     Learning Barriers/Preferences - 10/18/15 1647      Learning Barriers/Preferences   Learning Barriers None   Learning Preferences Video;Verbal Instruction;Pictoral;Written Material      Education Topics: Hypertension, Hypertension Reduction -Define heart disease and high blood pressure. Discus how high blood pressure affects the body and ways to reduce high blood pressure. Flowsheet Row CARDIAC REHAB PHASE II EXERCISE from 01/25/2016 in [place]  Date  12/28/15  Educator  DC  Instruction Review Code  2- meets goals/outcomes      Exercise and Your Heart -Discuss why it is important to exercise, the FITT principles of exercise, normal and abnormal responses to exercise, and how to exercise safely. Flowsheet Row CARDIAC REHAB PHASE II EXERCISE from 01/25/2016 in [place]  Date  01/04/16  Educator  Russella Dar  Instruction Review Code  2- meets goals/outcomes      Angina -Discuss definition of angina, causes of angina, treatment of angina, and how to decrease risk of having angina.   Cardiac Medications -Review what the following cardiac medications are used for, how they affect the body, and side effects that may occur when taking the medications.  Medications include Aspirin, Beta blockers, calcium channel blockers, ACE Inhibitors, angiotensin receptor blockers, diuretics, digoxin, and antihyperlipidemics. Flowsheet Row CARDIAC REHAB PHASE II EXERCISE from 01/25/2016 in [place]  Date  01/18/16  Educator  Russella Dar  Instruction Review Code  2- meets  goals/outcomes      Congestive Heart Failure -Discuss the definition of CHF, how to live with CHF, the signs and symptoms of CHF, and how keep track of weight and sodium intake. Flowsheet Row CARDIAC REHAB PHASE II EXERCISE from 01/25/2016 in [place]  Date  01/25/16  Educator  Lisabeth Register  Instruction Review Code  2- meets goals/outcomes      Heart Disease and Intimacy -Discus the effect sexual activity has on the heart, how changes occur during intimacy as we age, and safety during sexual activity. Flowsheet Row CARDIAC REHAB PHASE II EXERCISE from 01/25/2016 in [place]  Date  11/02/15  Educator  DJ  Instruction Review Code  2- meets goals/outcomes      Smoking Cessation / COPD -Discuss different methods to quit smoking, the health benefits of quitting smoking, and the definition of COPD. Flowsheet Row CARDIAC REHAB PHASE II EXERCISE from 01/25/2016 in [place]  Date  11/09/15  Educator  DC  Instruction Review Code  2- meets goals/outcomes      Nutrition I: Fats -Discuss the types of cholesterol, what cholesterol does to the heart, and how cholesterol levels can be controlled. Flowsheet Row CARDIAC REHAB PHASE II EXERCISE from 01/25/2016 in [place]  Date  11/16/15  Educator  Russella Dar  Instruction Review Code  2- meets goals/outcomes      Nutrition II: Labels -Discuss the different components of food labels and how to read food label [place] from 01/25/2016 in [place]  Date  11/23/15  Educator  Russella Dar  Instruction Review Code  2- meets goals/outcomes      Heart Parts and Heart Disease -Discuss the anatomy of the heart, the pathway of blood circulation through the heart, and these are affected by heart disease. Flowsheet Row CARDIAC REHAB PHASE II EXERCISE from 01/25/2016 in [place]  Date  11/30/15  Educator  DC  Instruction Review Code  2- meets goals/outcomes      Stress I: Signs and Symptoms -Discuss the causes of stress, how stress may lead to anxiety and depression, and ways to limit stress. Flowsheet Row CARDIAC REHAB PHASE II EXERCISE from 01/25/2016 in [place]  Date  12/07/15  Educator  Russella Dar  Instruction Review Code  2- meets goals/outcomes      Stress II: Relaxation -Discuss different types of relaxation techniques to limit stress. [place]  PHASE II EXERCISE from 01/25/2016 in [place]  Date  12/14/15  Educator  Russella Dar  Instruction Review Code  2- meets goals/outcomes      Warning Signs of Stroke / TIA -Discuss definition of a stroke, what the signs and symptoms are of a stroke, and how to identify when someone is having stroke. Flowsheet Row CARDIAC REHAB PHASE II EXERCISE from 01/25/2016 in [place]  Date  12/21/15  Educator  DJ  Instruction Review Code  2- meets goals/outcomes      Knowledge Questionnaire Score:     Knowledge Questionnaire Score - 02/01/16 1506      Knowledge Questionnaire Score   Pre Score 20/24   Post Score 22/24      Core Components/Risk Factors/Patient Goals at Admission:     Personal Goals and Risk Factors at Admission - 10/18/15 1659      Core Components/Risk Factors/Patient Goals on Admission    Weight Management Weight Maintenance  Patient's BMI WNL   Sedentary Yes   Intervention Provide advice, education, support and counseling about physical activity/exercise needs.;Develop an individualized exercise prescription for aerobic and resistive training based on initial evaluation findings, risk stratification, comorbidities and participant's personal goals.   Expected Outcomes Achievement of increased cardiorespiratory fitness and enhanced flexibility, muscular endurance and strength shown  through measurements of functional capacity and personal statement of participant.   Increase Strength and Stamina Yes   Intervention Provide advice, education, support and counseling about physical activity/exercise needs.;Develop an individualized exercise prescription for aerobic and resistive training based on initial evaluation findings, risk stratification, comorbidities and participant's personal goals.   Expected Outcomes Achievement of increased cardiorespiratory fitness and enhanced flexibility, muscular endurance and strength shown through measurements of functional capacity and personal statement of participant.   Improve shortness of breath with ADL's Yes   Intervention Provide education, individualized exercise plan and daily activity instruction to help decrease symptoms of SOB with activities of daily living.   Expected Outcomes Short Term: Achieves a reduction of symptoms when performing activities of daily living.   Personal Goal Other Yes   Personal Goal Gain strength and stamina, Do more activities w/o SOB   Intervention Attend CR 3 times week and supplement 2 days week at home.    Expected Outcomes To achieve personal goals.       Core Components/Risk Factors/Patient Goals Review:      Goals and Risk Factor Review    Row Name 10/18/15 1701 11/11/15 1430 12/07/15 1455 01/11/16 1313 02/01/16 1510     Core Components/Risk Factors/Patient Goals Review   Personal Goals Review Increase Strength and Stamina;Improve shortness of breath with ADL's Weight Management/Obesity;Increase Strength and Stamina;Improve shortness of breath with ADL's Weight Management/Obesity;Increase Strength and Stamina Weight Management/Obesity Weight Management/Obesity;Increase Strength and Stamina;Improve shortness of breath with ADL's   Review  - After 6 sessions, patient has lost 0.8 lbs. She feels like the program has helped her with some improvement with SOB and strength.  After 16 sessions, patient  has maintained her weight. She feels like the program is helping her with improvement with SOB, strength and stamina. She is doing well in the program.  Patient has attended 28 sessions. She continues to maintain her weight. Her strength and stamina continue to increase. She is doing well in the program.  Upon graduation, patient's weight remained the same. She did well in the program with increased strength and stamina and improved SOB overall. She said the program has  motivated her to exercise.    Expected Outcomes  - Patient will continue to attend program increasing her strength and stamina and improved SOB.  Patient will continue to attend sessions and complete the program maintaining her weight with improved SOB, strength and stamina.  Patient will complete the program meeting her personal goals.  Patient will continue to exercise and continue to meet her personal goals.       Core Components/Risk Factors/Patient Goals at Discharge (Final Review):      Goals and Risk Factor Review - 02/01/16 1510      Core Components/Risk Factors/Patient Goals Review   Personal Goals Review Weight Management/Obesity;Increase Strength and Stamina;Improve shortness of breath with ADL's   Review Upon graduation, patient's weight remained the same. She did well in the program with increased strength and stamina and improved SOB overall. She said the program has motivated her to exercise.    Expected Outcomes Patient will continue to exercise and continue to meet her personal goals.       ITP Comments:   Comments: Patient graduated from [place] today on 12/06/17after completing 36 sessions. She achieved LTG of 30 minutes of aerobic exercise at Max Met level of 3.44. All patients vitals are WNL. Patient has met with dietician. Discharge instruction has been reviewed in detail and patient stated an understanding of material given. Patient plans to exercise at home. Cardiac Rehab staff will make f/u  calls at 1 month, 6 months, and 1 year. Patient had no complaints of any abnormal S/S or pain on their exit visit.

## 2016-02-01 NOTE — Progress Notes (Signed)
Daily Session Note  Patient Details  Name: Lauren House MRN: [id number] Date of Birth: Sep 03, 1935 Referring Provider:   Flowsheet Row CARDIAC REHAB PHASE II ORIENTATION from 10/18/2015 in [place]  Referring Provider  Dr. Ellyn Hack       Encounter Date: 02/01/2016  Check In:     Session Check In - 02/01/16 0810      Check-In   Location [facility]   Staff Present Suzanne Boron, BS, EP, Exercise Physiologist;Debra Wynetta Emery, RN, BSN   Supervising physician immediately available to respond to emergencies See telemetry face sheet for immediately available MD   Medication changes reported     No   Fall or balance concerns reported    No   Warm-up and Cool-down Performed as group-led instruction   Resistance Training Performed Yes   VAD Patient? No     Pain Assessment   Currently in Pain? No/denies   Pain Score 0-No pain   Multiple Pain Sites No      Capillary Blood Glucose: No results found for this or any previous visit (from the past 24 hour(s)).   Goals Met:  Independence with exercise equipment Exercise tolerated well No report of cardiac concerns or symptoms Strength training completed today  Goals Unmet:  Not Applicable  Comments: Check out 915   Dr. Kate Sable is Medical Director for [place] and Pulmonary Rehab.

## 2016-02-01 NOTE — Progress Notes (Signed)
Discharge Summary  Patient Details  Name: CONYA FREERKSEN MRN: [id number] Date of Birth: 16-Jan-1936 Referring Provider:   Flowsheet Row CARDIAC REHAB PHASE II ORIENTATION from 10/18/2015 in [place]  Referring Provider  Dr. Ellyn Hack        Number of Visits: 36  Reason for Discharge:  Patient reached a stable level of exercise. Patient independent in their exercise.  Smoking History:  History  Smoking Status  . Never Smoker  Smokeless Tobacco  . Never Used    Diagnosis:  Stented coronary artery  ADL UCSD:   Initial Exercise Prescription:     Initial Exercise Prescription - 10/19/15 1400      Date of Initial Exercise RX and Referring Provider   Date 10/18/15   Referring Provider Dr. Lorin Glass   Level 2   Watts 15   Minutes 15   METs 1.9     Arm Ergometer   Level 2   Watts 15   Minutes 20   METs 1.9     Prescription Details   Frequency (times per week) 3   Duration Progress to 30 minutes of continuous aerobic without signs/symptoms of physical distress     Intensity   THRR REST +  30   THRR 40-80% of Max Heartrate 96-112-127   Ratings of Perceived Exertion 11-13   Perceived Dyspnea 0-4     Progression   Progression Continue progressive overload as per policy without signs/symptoms or physical distress.     Resistance Training   Training Prescription Yes   Weight 1   Reps 10-12      Discharge Exercise Prescription (Final Exercise Prescription Changes):     Exercise Prescription Changes - 01/03/16 1200      Exercise Review   Progression Yes     Response to Exercise   Blood Pressure (Admit) 130/82   Blood Pressure (Exercise) 152/80   Blood Pressure (Exit) 130/58   Heart Rate (Admit) 58 bpm   Heart Rate (Exercise) 80 bpm   Heart Rate (Exit) 63 bpm   Rating of Perceived Exertion (Exercise) 9   Duration Progress to 30 minutes of continuous aerobic without signs/symptoms of physical distress   Intensity  Rest + 30     Progression   Progression Continue progressive overload as per policy without signs/symptoms or physical distress.     Resistance Training   Training Prescription Yes   Weight 3   Reps 10-12     NuStep   Level 2   Watts 33   Minutes 15   METs 3.44     Arm Ergometer   Level 2.7   Watts 28   Minutes 20   METs 3.1     Home Exercise Plan   Plans to continue exercise at Home   Frequency Add 2 additional days to program exercise sessions.      Functional Capacity:     6 Minute Walk    Row Name 10/19/15 1407 10/19/15 1408 02/01/16 1443     6 Minute Walk   Phase Initial Initial Discharge   Distance  - 1200 feet 1300 feet   Distance % Change  -  - 8.33 %   Walk Time  - 6 minutes 6 minutes   # of Rest Breaks  - 0 0   MPH  - 2.27 2.46   METS  - 2.74 2.88   RPE  - 11 13   Perceived Dyspnea   -  9 13   VO2 Peak  - 7.4 9.33   Symptoms  - No No   Resting HR  - 66 bpm 69 bpm   Resting BP  - 132/72 142/74   Max Ex. HR  - 89 bpm 81 bpm   Max Ex. BP  - 174/72 156/76   2 Minute Post BP  - 140/74 144/76      Psychological, QOL, Others - Outcomes: PHQ 2/9: Depression screen [facility] 2/9 02/01/2016 10/18/2015  Decreased Interest 0 0  Down, Depressed, Hopeless 0 0  PHQ - 2 Score 0 0  Altered sleeping 1 1  Tired, decreased energy 1 0  Change in appetite 1 1  Feeling bad or failure about yourself  0 0  Trouble concentrating 0 0  Moving slowly or fidgety/restless 0 0  Suicidal thoughts 0 0  PHQ-9 Score 3 2  Difficult doing work/chores Not difficult at all Somewhat difficult    Quality of Life:     Quality of Life - 02/01/16 1445      Quality of Life Scores   Health/Function Pre 18 %   Health/Function Post 19.6 %   Health/Function % Change 8.89 %   Socioeconomic Pre 22.5 %   Socioeconomic Post 23 %   Socioeconomic % Change  2.22 %   Psych/Spiritual Pre 24.58 %   Psych/Spiritual Post 24.38 %   Psych/Spiritual % Change -0.81 %   Family Pre 30 %   Family  Post 30 %   Family % Change 0 %   GLOBAL Pre 22.59 %   GLOBAL Post 22.24 %   GLOBAL % Change -1.55 %      Personal Goals: Goals established at orientation with interventions provided to work toward goal.     Personal Goals and Risk Factors at Admission - 10/18/15 1659      Core Components/Risk Factors/Patient Goals on Admission    Weight Management Weight Maintenance  Patient's BMI WNL   Sedentary Yes   Intervention Provide advice, education, support and counseling about physical activity/exercise needs.;Develop an individualized exercise prescription for aerobic and resistive training based on initial evaluation findings, risk stratification, comorbidities and participant's personal goals.   Expected Outcomes Achievement of increased cardiorespiratory fitness and enhanced flexibility, muscular endurance and strength shown through measurements of functional capacity and personal statement of participant.   Increase Strength and Stamina Yes   Intervention Provide advice, education, support and counseling about physical activity/exercise needs.;Develop an individualized exercise prescription for aerobic and resistive training based on initial evaluation findings, risk stratification, comorbidities and participant's personal goals.   Expected Outcomes Achievement of increased cardiorespiratory fitness and enhanced flexibility, muscular endurance and strength shown through measurements of functional capacity and personal statement of participant.   Improve shortness of breath with ADL's Yes   Intervention Provide education, individualized exercise plan and daily activity instruction to help decrease symptoms of SOB with activities of daily living.   Expected Outcomes Short Term: Achieves a reduction of symptoms when performing activities of daily living.   Personal Goal Other Yes   Personal Goal Gain strength and stamina, Do more activities w/o SOB   Intervention Attend CR 3 times week and  supplement 2 days week at home.    Expected Outcomes To achieve personal goals.        Personal Goals Discharge:     Goals and Risk Factor Review    Row Name 10/18/15 1701 11/11/15 1430 12/07/15 1455 01/11/16 1313 02/01/16 1510  Core Components/Risk Factors/Patient Goals Review   Personal Goals Review Increase Strength and Stamina;Improve shortness of breath with ADL's Weight Management/Obesity;Increase Strength and Stamina;Improve shortness of breath with ADL's Weight Management/Obesity;Increase Strength and Stamina Weight Management/Obesity Weight Management/Obesity;Increase Strength and Stamina;Improve shortness of breath with ADL's   Review  - After 6 sessions, patient has lost 0.8 lbs. She feels like the program has helped her with some improvement with SOB and strength.  After 16 sessions, patient has maintained her weight. She feels like the program is helping her with improvement with SOB, strength and stamina. She is doing well in the program.  Patient has attended 28 sessions. She continues to maintain her weight. Her strength and stamina continue to increase. She is doing well in the program.  Upon graduation, patient's weight remained the same. She did well in the program with increased strength and stamina and improved SOB overall. She said the program has motivated her to exercise.    Expected Outcomes  - Patient will continue to attend program increasing her strength and stamina and improved SOB.  Patient will continue to attend sessions and complete the program maintaining her weight with improved SOB, strength and stamina.  Patient will complete the program meeting her personal goals.  Patient will continue to exercise and continue to meet her personal goals.       Nutrition & Weight - Outcomes:     Pre Biometrics - 10/19/15 1410      Pre Biometrics   Height 5\' 7"  (1.702 m)   Weight 150 lb 5.7 oz (68.2 kg)   Waist Circumference 36.5 inches   Hip Circumference 36.5  inches   Waist to Hip Ratio 1 %   BMI (Calculated) 23.6   Triceps Skinfold 4 mm   % Body Fat 28 %   Grip Strength 54.7 kg   Flexibility 12.25 in   Single Leg Stand 20 seconds         Post Biometrics - 02/01/16 1444       Post  Biometrics   Height 5\' 7"  (1.702 m)   Weight 149 lb 4.7 oz (67.7 kg)   Waist Circumference 35 inches   Hip Circumference 37 inches   Waist to Hip Ratio 0.95 %   BMI (Calculated) 23.4   Triceps Skinfold 4 mm   % Body Fat 27.4 %   Grip Strength 50 kg   Flexibility 14.33 in   Single Leg Stand 5 seconds      Nutrition:   Nutrition Discharge:     Nutrition Assessments - 02/01/16 1507      MEDFICTS Scores   Pre Score 30   Post Score 24   Score Difference -6      Education Questionnaire Score:     Knowledge Questionnaire Score - 02/01/16 1506      Knowledge Questionnaire Score   Pre Score 20/24   Post Score 22/24      Goals reviewed with patient; copy given to patient.

## 2016-02-03 ENCOUNTER — Encounter (HOSPITAL_COMMUNITY): Payer: Medicare Other

## 2016-03-19 ENCOUNTER — Telehealth: Payer: Self-pay | Admitting: Internal Medicine

## 2016-03-19 NOTE — Telephone Encounter (Signed)
Request for surgical clearance:  1. What type of surgery is being performed? extraction  2. When is this surgery scheduled? unknown   Are there any medications that need to be held prior to surgery and how long? plavix 3. Name of physician performing surgery? Dr.Wooden  4. What is your office phone and fax number?  [phone]

## 2016-03-19 NOTE — Telephone Encounter (Signed)
Clearance routed via EPIC fax to dentist office

## 2016-03-19 NOTE — Telephone Encounter (Signed)
It has been <1 year since her stent (06/2015) - she cannot stop the Plavix until that time.  Dr. Lemmie Evens

## 2016-03-19 NOTE — Telephone Encounter (Signed)
Returned call to [employer] with [place] message on personal voice mail will send message to Dr.Hilty for advice if ok to hold plavix for a tooth extraction.

## 2016-04-17 ENCOUNTER — Telehealth: Payer: Self-pay | Admitting: Internal Medicine

## 2016-04-17 MED ORDER — RANOLAZINE ER 500 MG PO TB12: 500.0000 mg | ORAL_TABLET | Freq: Two times a day (BID) | ORAL | 3 refills | Status: DC

## 2016-04-17 NOTE — Telephone Encounter (Signed)
MD portion of [employer] application completed + mailed to patient with printed Rx

## 2016-04-17 NOTE — Addendum Note (Signed)
Addended by: [name] on: 04/17/2016 03:01 PM   Modules accepted: Orders

## 2016-05-22 ENCOUNTER — Ambulatory Visit (INDEPENDENT_AMBULATORY_CARE_PROVIDER_SITE_OTHER): Payer: Medicare Other | Admitting: Internal Medicine

## 2016-05-22 ENCOUNTER — Encounter: Payer: Self-pay | Admitting: Internal Medicine

## 2016-05-22 VITALS — BP 161/85 | HR 61 | Ht 68.0 in | Wt 153.8 lb

## 2016-05-22 DIAGNOSIS — Z79899 Other long term (current) drug therapy: Secondary | ICD-10-CM | POA: Diagnosis not present

## 2016-05-22 DIAGNOSIS — R0602 Shortness of breath: Secondary | ICD-10-CM

## 2016-05-22 DIAGNOSIS — I422 Other hypertrophic cardiomyopathy: Secondary | ICD-10-CM

## 2016-05-22 DIAGNOSIS — I2510 Atherosclerotic heart disease of native coronary artery without angina pectoris: Secondary | ICD-10-CM

## 2016-05-22 DIAGNOSIS — I10 Essential (primary) hypertension: Secondary | ICD-10-CM

## 2016-05-22 DIAGNOSIS — Z9861 Coronary angioplasty status: Secondary | ICD-10-CM

## 2016-05-22 NOTE — Patient Instructions (Signed)
Your physician recommends that you return for lab work TODAY  Your physician has requested that you have an echocardiogram @ [facility]. Echocardiography is a painless test that uses sound waves to create images of your heart. It provides your doctor with information about the size and shape of your heart and how well your heart's chambers and valves are working. This procedure takes approximately one hour. There are no restrictions for this procedure.  Your physician recommends that you schedule a follow-up appointment after your echo

## 2016-05-22 NOTE — Progress Notes (Signed)
OFFICE NOTE  Chief Complaint:  Progressive dyspnea  Primary Care Physician: Odette Fraction, MD  HPI:  Lauren House is a pleasant 81 year old female I have been following for history of apical hypertrophic cardiomyopathy for which she has done well on Ranexa 500 mg daily. Recently, she was having right inguinal pain and had a hernia for which she had repair. That, unfortunately, she says has caused her more harm than good and she continues to have more pain in her right hip area which possibly is related to the hip. She saw Dr. Wynelle Link at [facility] for further evaluation from that and has received 2 hip injections which have been beneficial. He did mention that he felt she might need a hip replacement. Recently she had a recurrent episode of chest pain. This caused her to the hospital and improved with nitroglycerin. She was ruled out for MI and discharged. Unfortunately she returned several days later after she developed swelling, warmth and pain in her left forearm. It was felt that she might have possibly had an infected IV site and clearly had superficial thrombophlebitis. She was placed on antibiotics for this and is being treated with some improvement in her symptoms. After her emergency room visit, it was recommended that she increase her Ranexa 500 mg twice daily which is the recommended starting dose. She's had no further chest pain episodes. She had prior cardiac catheterization in 2011 which demonstrated a 50-60% LAD lesion. She underwent FFR for this which was negative with an FFR of 0.99. She had had a stress test at that time which indicated no evidence of reversible ischemia., but was having persistent chest pain.  It is been felt that her chest pain was due to small vessel disease in the setting of significant apical variant hypertrophic cardiomyopathy. This has been treated most effectively with ranolazine, and nitrates to some extent. Although her symptoms now have  escalated. This is concerning to me for possible worsening of her LAD disease.  Lauren House underwent a nuclear stress test on 02/03/2013. This was negative for ischemia with an EF of 52%. Since then she has reported some improvement in her symptoms. Her main complaints now have to do with left hip pain.  She is still contemplating surgery. She reported that her husband was recently diagnosed with esophageal cancer and it seems to be quite advanced. There did not appear to be any surgical options. This has caused her significant anxiety but she reports no chest pain.  Lauren House returns today for follow-up. Unfortunately she mentioned that her husband died about 3 months ago. He was diagnosed with esophageal cancer which was aggressive and inoperable. She is still grieving which is to be expected. She continues to have problems with her hip and at this point apparently will need hip surgery, but probably not hip replacement. That is scheduled about one month from now. She does have a history of moderate LAD stenosis in 2011 with a negative stress test one year ago. Recently, she has had worsening shortness of breath and chest pressure with exertion. She initially felt this was due to stress however her symptoms have been somewhat progressive and concern me for ischemia. Her EKG shows stable T-wave inversions consistent with her hypertrophic cardiomyopathy.  I saw Ms. House back in the office today. Overall she feels like she is doing well. She did have some mild chest discomfort last week and took a nitroglycerin. This is when she was in [place] with her son and  daughter-in-law and they were walking all over town. She managed to walk for several hours without any significant shortness of breath but did have some mild chest discomfort which was relieved fairly quickly with nitroglycerin. Overall she is doing well. She had her hip replacement on the left side which she is recovering from. She still has discomfort  when sitting in a car for long periods of time. She tells me that she's given a be going up to [place] fairly shortly on a vacation and that they will be going up to [facility] for the first time.'  Lauren House returns today for follow-up. She tells me on Saturday night she was awakened early in the morning with palpitations and a warm feeling as well as heaviness in her chest. She says it was quite intense. She denies any vivid dreams prior to that. She's not had any further episodes of chest pain since that or recurrent palpitations. She took a nitroglycerin and the symptoms resolved fairly promptly. She was able to go back to bed. Her EKGs is unchanged showing the T-wave inversions typical of apical variant hypertrophic cardiomyopathy. Her last stress test was in 2014 it was negative for ischemia. She does have a known 50-60% LAD stenosis which was negative by FFR.   07/12/2015  Lauren House was seen today in the office for follow-up. Recently she went to vacation with her family and they noticed more progressive shortness of breath and chest pressure. This was keeping her from doing certain activities. She says her symptoms of gotten worse since her last office visit in December. At that time I performed a nuclear stress test which showed a low normal EF 51% but no reversible ischemia. She does have known 50-60% LAD stenosis which was negative by FFR by catheterization in 2012.  08/26/2015  Lauren House returns today for follow-up. She underwent cardiac catheterization for progressive chest pain concerning for unstable angina on 07/22/2015 by Dr. Ellyn Hack, this demonstrated the following:  1. Mid LAD lesion, 80% stenosed. Post intervention with Promus DES 2.25 mm x 16 mm (2.4 mm), there is a 0% residual stenosis. 2. Ost LAD lesion, 50% stenosed. stable from original catheterization 3. The left ventricular systolic function is normal. with minimal apical filling consistent with known apical  hypertrophy. 4. Otherwise mild to moderate disease in the circumflex and RCA system  Since her coronary angiography and stent placement, she reports she is doing fairly well. She started back doing some exercise and push mowed her lawn the other day. Unfortunate she developed a cracked tooth and she's been having problems with pain. She took antibiotics including doxycycline which causes her stomach upset. She has a temporary crown and will be going back for some more dental work. On questioning today she reports that she has discontinued her Plavix. She said she's been off of it for about one week because of spontaneous bruising. She was not counseled to discontinue this and I reiterated the fact today that being off of dual antiplatelet therapy could put her at risk for acute in-stent thrombosis which could lead to massive heart attack.  11/18/2015  Lauren House was seen today in follow-up. She has been undergoing cardiac rehabilitation. She restart her Plavix which is very important given her recent stent in May of this past year. She denies any chest pain but still gets short of breath. She says is fairly stable but not necessarily getting worse. She is now gone through 8 sessions rehabilitation and is up to  3.4 metabolic equivalents. Blood pressures been fairly well-controlled.  05/22/2016  Lauren House returns today for follow-up. When I last saw her she was undergoing cardiac rehabilitation and reported shortness of breath with exertion. This was shortly after her stenting I recommended continued cardiac rehabilitation. She says that after finishing rehabilitation she has not had any improvement in her shortness of breath and perhaps it may be worse. She notes it when walking up and down her stairs to her basement. She says after the second trip up the stairs she does get short of breath. She is under a lot of stress and there are some family issues which required her grandson to move in with her. She  denies any chest pain. Blood pressure was elevated today 161/85. She says it is always up in the office, however was 140/80 last time.  PMHx:  Past Medical History:  Diagnosis Date  . Anxiety   . Apical variant hypertrophic cardiomyopathy ([place])   . Arthritis   . Borderline diabetes mellitus   . Bruises easily   . Bursitis of left hip   . Coronary atherosclerosis of native coronary artery    a. 2011: Cath showing nonobstructive CAD - 50-60% LAD (normal FFR) b. 06/2015: Cath showing 80% stenosis in the Mid LAD, tx w/ Promus DES 2.25 mm x 16 mm   . Essential hypertension, benign   . History of kidney stones   . History of skin cancer    Basal cell - abdomen  . Hyperlipidemia    Diet controlled.cannot take meds  . Nocturia   . Osteoporosis   . Overactive bladder   . Shortness of breath dyspnea   . Vertigo     Past Surgical History:  Procedure Laterality Date  . ABDOMINAL HYSTERECTOMY    . CARDIAC CATHETERIZATION N/A 07/22/2015   Procedure: Left Heart Cath and Coronary Angiography;  Surgeon: Leonie Man, MD;  Location: [facility];  Service: Cardiovascular;  Laterality: N/A;  . CARDIAC CATHETERIZATION N/A 07/22/2015   Procedure: Coronary Stent Intervention;  Surgeon: Leonie Man, MD;  Location: [facility];  Service: Cardiovascular;  Laterality: N/A;  . CORONARY STENT PLACEMENT  07/22/2015   mid lad  des   . EYE SURGERY     cataracts bilateral  . HEMORRHOID SURGERY    . INGUINAL HERNIA REPAIR  10/11/2011   Procedure: HERNIA REPAIR INGUINAL ADULT;  Surgeon: Stark Klein, MD;  Location: [facility];  Service: General;  Laterality: Right;  . OPEN SURGICAL REPAIR OF GLUTEAL TENDON Left 03/12/2014   Procedure: LEFT HIP BURSECTOMY AND GLUTEAL TENDON REPAIR;  Surgeon: [name], MD;  Location: [facility];  Service: Orthopedics;  Laterality: Left;    FAMHx:  Family History  Problem Relation Age of Onset  . Heart disease Father   . Lung cancer Sister   . Lung cancer  Brother     SOCHx:   reports that she has never smoked. She has never used smokeless tobacco. She reports that she does not drink alcohol or use drugs.  ALLERGIES:  Allergies  Allergen Reactions  . Chlorhexidine Itching  . Vancomycin Hives  . Aspirin Other (See Comments)    Nose bleeds with full strength  . Atorvastatin Other (See Comments)    Muscle pain  . Bactrim [Sulfamethoxazole-Trimethoprim] Other (See Comments)    Doesn't remember   . Cephalosporins Hives and Itching  . Ciprofloxacin Nausea Only  . Clarithromycin Other (See Comments)    Doesn't remember   .  Colestipol Hcl Other (See Comments)    Hallucinations  . Ezetimibe Other (See Comments)    GI upset  . Simvastatin Other (See Comments)    Muscle spasms  . Penicillins Rash    Has patient had a PCN reaction causing immediate rash, facial/tongue/throat swelling, SOB or lightheadedness with hypotension: yes Has patient had a PCN reaction causing severe rash involving mucus membranes or skin necrosis: no Has patient had a PCN reaction that required hospitalization no Has patient had a PCN reaction occurring within the last 10 years: no If all of the above answers are "NO", then may proceed with Cephalosporin use.   . Tape Rash    ROS: Pertinent items noted in HPI and remainder of comprehensive ROS otherwise negative.  HOME MEDS: Current Outpatient Prescriptions  Medication Sig Dispense Refill  . aspirin EC 81 MG tablet Take 81 mg by mouth daily.    . clopidogrel (PLAVIX) 75 MG tablet Take 75 mg by mouth daily.    . Cyanocobalamin (VITAMIN B12 PO) Take 1 tablet by mouth daily.    . fluticasone (FLONASE) 50 MCG/ACT nasal spray USE TWO SPRAY IN EACH NOSTRIL EVERY DAY 50 g 1  . metoprolol tartrate (LOPRESSOR) 25 MG tablet Take 0.5 tablets (12.5 mg total) by mouth 2 (two) times daily. 30 tablet 11  . Multiple Vitamins-Minerals (OCUVITE PO) Take 1 tablet by mouth daily.    . nitroGLYCERIN (NITROSTAT) 0.4 MG SL tablet  Place 1 tablet (0.4 mg total) under the tongue every 5 (five) minutes as needed for chest pain. 25 tablet 3  . Polyethyl Glycol-Propyl Glycol (SYSTANE OP) Apply 1 drop to eye 3 (three) times daily as needed (Dry eyes).    . ranolazine (RANEXA) 500 MG 12 hr tablet Take 1 tablet (500 mg total) by mouth 2 (two) times daily. 180 tablet 3   No current facility-administered medications for this visit.     LABS/IMAGING: No results found for this or any previous visit (from the past 48 hour(s)). No results found.  VITALS: BP (!) 161/85   Pulse 61   Ht 5\' 8"  (1.727 m)   Wt 153 lb 12.8 oz (69.8 kg)   BMI 23.39 kg/m   EXAM: GEN: Awake, in no apparent distress HEENT: PERRLA, EOMI Lungs: Clear Cardiovascular: Regular rate and rhythm normal S1-S2 Abdomen: Soft, nontender positive bowel sounds Extremities: No edema Neurologic: No focal deficits Psychiatric: Normal mood, affect  EKG: Sinus rhythm 61, LVH with lateral T-wave changes (c/w known apical hypertrophic cardiomyopathy)  ASSESSMENT: 1. Progressive DOE 2. Unstable angina - status post PCI with a Promus DES (2.25 mm x 16 mm to the mid LAD)- known 50-60% ostial LAD stenosis (FFR 0.99 in 2011) which was stable at repeat cath (06/2015) 3. Apical variant hypertrophic cardimyopathy - anterolateral TWI's - negative nuclear stress test 01-2015 4. Hypertension 5. Venous insufficiency 6. Right inguinal pain with a hernia, status post surgical repair 7. Right hip pain with degenerative hip disease - s/p surgery  PLAN: 1.   Lauren House has had some progressive shortness of breath with exertion. This was present while she was doing rehabilitation but is not really improved. It could be related to diastolic dysfunction given her history of apical hypertrophic cardiopathy. Would not repeat an echo for wall. Would like her to have a repeat echo and we'll check lab work including a metabolic profile and BNP. Blood pressure is elevated that could be  playing a role. Most likely I would want her to be  on a low dose diuretic if indicated.   Follow-up after the study.  [name], MD, [facility] Attending Cardiologist [place] Ondria Oswald 05/22/2016, 1:06 PM

## 2016-05-24 LAB — BASIC METABOLIC PANEL
BUN: 17 mg/dL (ref 7–25)
CHLORIDE: 102 mmol/L (ref 98–110)
CO2: 21 mmol/L (ref 20–31)
Calcium: 9.2 mg/dL (ref 8.6–10.4)
Creat: 1 mg/dL — ABNORMAL HIGH (ref 0.60–0.93)
Glucose, Bld: 96 mg/dL (ref 65–99)
Potassium: 4.8 mmol/L (ref 3.5–5.3)
SODIUM: 141 mmol/L (ref 135–146)

## 2016-05-24 LAB — BRAIN NATRIURETIC PEPTIDE: Brain Natriuretic Peptide: 99 pg/mL (ref <100)

## 2016-05-25 ENCOUNTER — Ambulatory Visit (HOSPITAL_COMMUNITY)
Admission: RE | Admit: 2016-05-25 | Discharge: 2016-05-25 | Disposition: A | Discharge status: Home / Self Care | Payer: Medicare Other | Source: Ambulatory Visit | Attending: Internal Medicine | Admitting: Internal Medicine

## 2016-05-25 ENCOUNTER — Telehealth: Payer: Self-pay | Admitting: Internal Medicine

## 2016-05-25 DIAGNOSIS — R0602 Shortness of breath: Secondary | ICD-10-CM | POA: Insufficient documentation

## 2016-05-25 DIAGNOSIS — I340 Nonrheumatic mitral (valve) insufficiency: Secondary | ICD-10-CM | POA: Diagnosis not present

## 2016-05-25 DIAGNOSIS — I2510 Atherosclerotic heart disease of native coronary artery without angina pectoris: Secondary | ICD-10-CM | POA: Insufficient documentation

## 2016-05-25 DIAGNOSIS — I10 Essential (primary) hypertension: Secondary | ICD-10-CM | POA: Diagnosis not present

## 2016-05-25 DIAGNOSIS — E785 Hyperlipidemia, unspecified: Secondary | ICD-10-CM | POA: Diagnosis not present

## 2016-05-25 LAB — ECHOCARDIOGRAM COMPLETE
AOPV: 0.67 m/s
AOVTI: 37.9 cm
AV Area VTI index: 0.67 cm2/m2
AV Mean grad: 5 mmHg
AV Peak grad: 10 mmHg
AV pk vel: 155 cm/s
AVAREAMEANV: 1.23 cm2
AVAREAMEANVIN: 0.67 cm2/m2
AVAREAVTI: 1.35 cm2
AVCELMEANRAT: 0.61
CHL CUP AV PEAK INDEX: 0.74
CHL CUP AV VEL: 1.22
CHL CUP DOP CALC LVOT VTI: 23 cm
CHL CUP STROKE VOLUME: 35 mL
DOP CAL AO MEAN VELOCITY: 101 cm/s
E decel time: 275 msec
E/e' ratio: 16.81
FS: 36 % (ref 28–44)
IV/PV OW: 1.86
LA ID, A-P, ES: 34 mm
LA diam end sys: 34 mm
LA vol A4C: 53.4 ml
LA vol: 55.3 mL
LADIAMINDEX: 1.86 cm/m2
LAVOLIN: 30.2 mL/m2
LDCA: 2.01 cm2
LV E/e' medial: 16.81
LV PW d: 10.1 mm — AB (ref 0.6–1.1)
LV dias vol index: 30 mL/m2
LV dias vol: 56 mL (ref 46–106)
LV e' LATERAL: 5.11 cm/s
LVEEAVG: 16.81
LVOT SV: 46 mL
LVOT peak grad rest: 4 mmHg
LVOTD: 16 mm
LVOTPV: 104 cm/s
LVOTVTI: 0.61 cm
LVSYSVOL: 21 mL
LVSYSVOLIN: 11 mL/m2
Lateral S' vel: 10.1 cm/s
MV Dec: 275
MV Peak grad: 3 mmHg
MVPKAVEL: 142 m/s
MVPKEVEL: 85.9 m/s
Simpson's disk: 62
TAPSE: 17.9 mm
TDI e' lateral: 5.11
TDI e' medial: 3.81
Valve area index: 0.67
Valve area: 1.22 cm2

## 2016-05-25 NOTE — Telephone Encounter (Signed)
Patient called with lab results.

## 2016-05-25 NOTE — Progress Notes (Signed)
*  PRELIMINARY RESULTS* Echocardiogram 2D Echocardiogram has been performed.  Samuel Germany 05/25/2016, 11:24 AM

## 2016-05-25 NOTE — Telephone Encounter (Signed)
Follow Up ° ° ° °Returning call from earlier. Please call. °

## 2016-05-31 ENCOUNTER — Other Ambulatory Visit: Payer: Self-pay | Admitting: Internal Medicine

## 2016-05-31 ENCOUNTER — Telehealth: Payer: Self-pay | Admitting: Internal Medicine

## 2016-05-31 MED ORDER — RANOLAZINE ER 500 MG PO TB12: 500.0000 mg | ORAL_TABLET | Freq: Two times a day (BID) | ORAL | 3 refills | Status: DC

## 2016-05-31 NOTE — Telephone Encounter (Signed)
Faxed prescription for ranexa 500mg  BID to [employer] [facility] program - [facility] @ [phone]

## 2016-06-26 ENCOUNTER — Ambulatory Visit (INDEPENDENT_AMBULATORY_CARE_PROVIDER_SITE_OTHER): Payer: Medicare Other | Admitting: Internal Medicine

## 2016-06-26 ENCOUNTER — Encounter: Payer: Self-pay | Admitting: Internal Medicine

## 2016-06-26 VITALS — BP 150/81 | HR 66 | Ht 69.0 in | Wt 148.6 lb

## 2016-06-26 DIAGNOSIS — I2510 Atherosclerotic heart disease of native coronary artery without angina pectoris: Secondary | ICD-10-CM

## 2016-06-26 DIAGNOSIS — R0600 Dyspnea, unspecified: Secondary | ICD-10-CM

## 2016-06-26 DIAGNOSIS — E782 Mixed hyperlipidemia: Secondary | ICD-10-CM

## 2016-06-26 DIAGNOSIS — I422 Other hypertrophic cardiomyopathy: Secondary | ICD-10-CM | POA: Diagnosis not present

## 2016-06-26 DIAGNOSIS — Z79899 Other long term (current) drug therapy: Secondary | ICD-10-CM

## 2016-06-26 DIAGNOSIS — Z9861 Coronary angioplasty status: Secondary | ICD-10-CM

## 2016-06-26 DIAGNOSIS — R0609 Other forms of dyspnea: Secondary | ICD-10-CM | POA: Diagnosis not present

## 2016-06-26 MED ORDER — CHLORTHALIDONE 25 MG PO TABS: 12.5000 mg | ORAL_TABLET | Freq: Every day | ORAL | 3 refills | Status: DC

## 2016-06-26 NOTE — Patient Instructions (Addendum)
Dr. Debara Pickett has referred you to our [place] will schedule an appointment with one of our clinical pharmacist in our office to discuss (Pral[place])  Your physician has recommended you make the following change in your medication:  -- START chlorthalidone 12.5mg  once daily -- STOP Plavix Jul 21, 2016  Your physician recommends that you return for lab work in: [place] wants you to follow-up in: 6 months with Dr. Debara Pickett. You will receive a reminder letter in the mail two months in advance. If you don't receive a letter, please call our office to schedule the follow-up appointment.

## 2016-06-26 NOTE — Progress Notes (Signed)
OFFICE NOTE  Chief Complaint:  Follow-up echo  Primary Care Physician: Lauren Fraction, MD  HPI:  [name] is a pleasant 81 year old female I have been following for history of apical hypertrophic cardiomyopathy for which she has done well on Ranexa 500 mg daily. Recently, she was having right inguinal pain and had a hernia for which she had repair. That, unfortunately, she says has caused her more harm than good and she continues to have more pain in her right hip area which possibly is related to the hip. She saw Dr. Wynelle Link at [facility] for further evaluation from that and has received 2 hip injections which have been beneficial. He did mention that he felt she might need a hip replacement. Recently she had a recurrent episode of chest pain. This caused her to the hospital and improved with nitroglycerin. She was ruled out for MI and discharged. Unfortunately she returned several days later after she developed swelling, warmth and pain in her left forearm. It was felt that she might have possibly had an infected IV site and clearly had superficial thrombophlebitis. She was placed on antibiotics for this and is being treated with some improvement in her symptoms. After her emergency room visit, it was recommended that she increase her Ranexa 500 mg twice daily which is the recommended starting dose. She's had no further chest pain episodes. She had prior cardiac catheterization in 2011 which demonstrated a 50-60% LAD lesion. She underwent FFR for this which was negative with an FFR of 0.99. She had had a stress test at that time which indicated no evidence of reversible ischemia., but was having persistent chest pain.  It is been felt that her chest pain was due to small vessel disease in the setting of significant apical variant hypertrophic cardiomyopathy. This has been treated most effectively with ranolazine, and nitrates to some extent. Although her symptoms now have  escalated. This is concerning to me for possible worsening of her LAD disease.  Lauren House underwent a nuclear stress test on 02/03/2013. This was negative for ischemia with an EF of 52%. Since then she has reported some improvement in her symptoms. Her main complaints now have to do with left hip pain.  She is still contemplating surgery. She reported that her husband was recently diagnosed with esophageal cancer and it seems to be quite advanced. There did not appear to be any surgical options. This has caused her significant anxiety but she reports no chest pain.  Lauren House returns today for follow-up. Unfortunately she mentioned that her husband died about 3 months ago. He was diagnosed with esophageal cancer which was aggressive and inoperable. She is still grieving which is to be expected. She continues to have problems with her hip and at this point apparently will need hip surgery, but probably not hip replacement. That is scheduled about one month from now. She does have a history of moderate LAD stenosis in 2011 with a negative stress test one year ago. Recently, she has had worsening shortness of breath and chest pressure with exertion. She initially felt this was due to stress however her symptoms have been somewhat progressive and concern me for ischemia. Her EKG shows stable T-wave inversions consistent with her hypertrophic cardiomyopathy.  I saw Lauren House back in the office today. Overall she feels like she is doing well. She did have some mild chest discomfort last week and took a nitroglycerin. This is when she was in [place] with her son and  daughter-in-law and they were walking all over town. She managed to walk for several hours without any significant shortness of breath but did have some mild chest discomfort which was relieved fairly quickly with nitroglycerin. Overall she is doing well. She had her hip replacement on the left side which she is recovering from. She still has discomfort  when sitting in a car for long periods of time. She tells me that she's given a be going up to [place] fairly shortly on a vacation and that they will be going up to [facility] for the first time.'  Lauren House returns today for follow-up. She tells me on Saturday night she was awakened early in the morning with palpitations and a warm feeling as well as heaviness in her chest. She says it was quite intense. She denies any vivid dreams prior to that. She's not had any further episodes of chest pain since that or recurrent palpitations. She took a nitroglycerin and the symptoms resolved fairly promptly. She was able to go back to bed. Her EKGs is unchanged showing the T-wave inversions typical of apical variant hypertrophic cardiomyopathy. Her last stress test was in 2014 it was negative for ischemia. She does have a known 50-60% LAD stenosis which was negative by FFR.   07/12/2015  Lauren House was seen today in the office for follow-up. Recently she went to vacation with her family and they noticed more progressive shortness of breath and chest pressure. This was keeping her from doing certain activities. She says her symptoms of gotten worse since her last office visit in December. At that time I performed a nuclear stress test which showed a low normal EF 51% but no reversible ischemia. She does have known 50-60% LAD stenosis which was negative by FFR by catheterization in 2012.  08/26/2015  Lauren House returns today for follow-up. She underwent cardiac catheterization for progressive chest pain concerning for unstable angina on 07/22/2015 by Dr. Ellyn Hack, this demonstrated the following:  1. Mid LAD lesion, 80% stenosed. Post intervention with Promus DES 2.25 mm x 16 mm (2.4 mm), there is a 0% residual stenosis. 2. Ost LAD lesion, 50% stenosed. stable from original catheterization 3. The left ventricular systolic function is normal. with minimal apical filling consistent with known apical  hypertrophy. 4. Otherwise mild to moderate disease in the circumflex and RCA system  Since her coronary angiography and stent placement, she reports she is doing fairly well. She started back doing some exercise and push mowed her lawn the other day. Unfortunate she developed a cracked tooth and she's been having problems with pain. She took antibiotics including doxycycline which causes her stomach upset. She has a temporary crown and will be going back for some more dental work. On questioning today she reports that she has discontinued her Plavix. She said she's been off of it for about one week because of spontaneous bruising. She was not counseled to discontinue this and I reiterated the fact today that being off of dual antiplatelet therapy could put her at risk for acute in-stent thrombosis which could lead to massive heart attack.  11/18/2015  Lauren House was seen today in follow-up. She has been undergoing cardiac rehabilitation. She restart her Plavix which is very important given her recent stent in May of this past year. She denies any chest pain but still gets short of breath. She says is fairly stable but not necessarily getting worse. She is now gone through 8 sessions rehabilitation and is up to  3.4 metabolic equivalents. Blood pressures been fairly well-controlled.  05/22/2016  Lauren House returns today for follow-up. When I last saw her she was undergoing cardiac rehabilitation and reported shortness of breath with exertion. This was shortly after her stenting I recommended continued cardiac rehabilitation. She says that after finishing rehabilitation she has not had any improvement in her shortness of breath and perhaps it may be worse. She notes it when walking up and down her stairs to her basement. She says after the second trip up the stairs she does get short of breath. She is under a lot of stress and there are some family issues which required her grandson to move in with her. She  denies any chest pain. Blood pressure was elevated today 161/85. She says it is always up in the office, however was 140/80 last time.  06/26/2016  Lauren House was seen today in follow-up. Her echocardiogram shows preserved systolic function, mild diastolic dysfunction and elevated LV filling pressure. We understand that she would have diastolic dysfunction secondary to apical hypertrophic cardiomyopathy. She continues to have shortness of breath with exertion. Her BNP is very low less than 100 and stable. I suspect her symptoms may be due to diastolic dysfunction elevated LVEDP. Blood pressure was over 503 systolic today. In addition, recent lipid profile indicated 2 cholesterol 279, triglycerides 216, HDL 48 and LDL-C1 88. Given her coronary artery disease her goal LDL-C is less than 70. She has been intolerant to numerous statins including rosuvastatin, atorvastatin and pravastatin. She is close to one year since her drug-eluting stent in the LAD.  PMHx:  Past Medical History:  Diagnosis Date  . Anxiety   . Apical variant hypertrophic cardiomyopathy ([place])   . Arthritis   . Borderline diabetes mellitus   . Bruises easily   . Bursitis of left hip   . Coronary atherosclerosis of native coronary artery    a. 2011: Cath showing nonobstructive CAD - 50-60% LAD (normal FFR) b. 06/2015: Cath showing 80% stenosis in the Mid LAD, tx w/ Promus DES 2.25 mm x 16 mm   . Essential hypertension, benign   . History of kidney stones   . History of skin cancer    Basal cell - abdomen  . Hyperlipidemia    Diet controlled.cannot take meds  . Nocturia   . Osteoporosis   . Overactive bladder   . Shortness of breath dyspnea   . Vertigo     Past Surgical History:  Procedure Laterality Date  . ABDOMINAL HYSTERECTOMY    . CARDIAC CATHETERIZATION N/A 07/22/2015   Procedure: Left Heart Cath and Coronary Angiography;  Surgeon: Leonie Man, MD;  Location: [place] [facility];  Service: Cardiovascular;   Laterality: N/A;  . CARDIAC CATHETERIZATION N/A 07/22/2015   Procedure: Coronary Stent Intervention;  Surgeon: Leonie Man, MD;  Location: [place] [facility];  Service: Cardiovascular;  Laterality: N/A;  . CORONARY STENT PLACEMENT  07/22/2015   mid lad  des   . EYE SURGERY     cataracts bilateral  . HEMORRHOID SURGERY    . INGUINAL HERNIA REPAIR  10/11/2011   Procedure: HERNIA REPAIR INGUINAL ADULT;  Surgeon: Stark Klein, MD;  Location: [place];  Service: General;  Laterality: Right;  . OPEN SURGICAL REPAIR OF GLUTEAL TENDON Left 03/12/2014   Procedure: LEFT HIP BURSECTOMY AND GLUTEAL TENDON REPAIR;  Surgeon: [name], MD;  Location: [facility];  Service: Orthopedics;  Laterality: Left;    FAMHx:  Family History  Problem Relation Age of Onset  . Heart disease Father   . Lung cancer Sister   . Lung cancer Brother     SOCHx:   reports that she has never smoked. She has never used smokeless tobacco. She reports that she does not drink alcohol or use drugs.  ALLERGIES:  Allergies  Allergen Reactions  . Chlorhexidine Itching  . Vancomycin Hives  . Aspirin Other (See Comments)    Nose bleeds with full strength  . Atorvastatin Other (See Comments)    Muscle pain  . Bactrim [Sulfamethoxazole-Trimethoprim] Other (See Comments)    Doesn't remember   . Cephalosporins Hives and Itching  . Ciprofloxacin Nausea Only  . Clarithromycin Other (See Comments)    Doesn't remember   . Colestipol Hcl Other (See Comments)    Hallucinations  . Ezetimibe Other (See Comments)    GI upset  . Simvastatin Other (See Comments)    Muscle spasms  . Penicillins Rash    Has patient had a PCN reaction causing immediate rash, facial/tongue/throat swelling, SOB or lightheadedness with hypotension: yes Has patient had a PCN reaction causing severe rash involving mucus membranes or skin necrosis: no Has patient had a PCN reaction that required hospitalization no Has patient had a PCN reaction occurring  within the last 10 years: no If all of the above answers are "NO", then may proceed with Cephalosporin use.   . Tape Rash    ROS: Pertinent items noted in HPI and remainder of comprehensive ROS otherwise negative.  HOME MEDS: Current Outpatient Prescriptions  Medication Sig Dispense Refill  . aspirin EC 81 MG tablet Take 81 mg by mouth daily.    . clopidogrel (PLAVIX) 75 MG tablet Take 75 mg by mouth daily.    . Cyanocobalamin (VITAMIN B12 PO) Take 1 tablet by mouth daily.    . fluticasone (FLONASE) 50 MCG/ACT nasal spray USE TWO SPRAY IN EACH NOSTRIL EVERY DAY 50 g 1  . metoprolol tartrate (LOPRESSOR) 25 MG tablet Take 0.5 tablets (12.5 mg total) by mouth 2 (two) times daily. 30 tablet 11  . Multiple Vitamins-Minerals (OCUVITE PO) Take 1 tablet by mouth daily.    . nitroGLYCERIN (NITROSTAT) 0.4 MG SL tablet Place 1 tablet (0.4 mg total) under the tongue every 5 (five) minutes as needed for chest pain. 25 tablet 3  . Polyethyl Glycol-Propyl Glycol (SYSTANE OP) Apply 1 drop to eye 3 (three) times daily as needed (Dry eyes).    . ranolazine (RANEXA) 500 MG 12 hr tablet Take 1 tablet (500 mg total) by mouth 2 (two) times daily. 180 tablet 3   No current facility-administered medications for this visit.     LABS/IMAGING: No results found for this or any previous visit (from the past 48 hour(s)). No results found.  VITALS: BP (!) 150/81   Pulse 66   Ht 5\' 9"  (1.753 m)   Wt 148 lb 9.6 oz (67.4 kg)   BMI 21.94 kg/m   EXAM: GEN: Awake, in no apparent distress HEENT: PERRLA, EOMI Lungs: Clear Cardiovascular: Regular rate and rhythm normal S1-S2 Abdomen: Soft, nontender positive bowel sounds Extremities: No edema Neurologic: No focal deficits Psychiatric: Normal mood, affect  EKG: Deferred  ASSESSMENT: 1. Progressive DOE - echo with normal systolic function, mild diastolic dysfunction and elevated LVEDP  2. Unstable angina - status post PCI with a Promus DES (2.25 mm x 16 mm  to the mid LAD)- known 50-60% ostial LAD stenosis (FFR 0.99 in 2011) which was stable at  repeat cath (06/2015) 3. Apical variant hypertrophic cardimyopathy - anterolateral TWI's - negative nuclear stress test 01-2015 4. Hypertension 5. Venous insufficiency 6. Right inguinal pain with a hernia, status post surgical repair 7. Right hip pain with degenerative hip disease - s/p surgery  PLAN: 1.   Lauren House continues to have shortness of breath with exertion. May be related to diastolic dysfunction and elevated LVEDP. Blood pressure is elevated today. Will add chlorthalidone 12.5 mg daily and recheck a metabolic profile one week. In addition her cholesterol is untreated due to statin intolerance. She has failed rosuvastatin, atorvastatin and pravastatin at moderate to high intensity doses and has an LDL-C of 188. Her goal LDL-C is less than 70 given recent active coronary artery disease. She is a good candidate for a PC SK 9 inhibitor. I'm recommending starting Praluent. Will refer her to lipid clinic for initiation and training.  Follow-up with me in 6 months.  [name], MD, [facility] Attending Cardiologist [place] 06/26/2016, 8:29 AM

## 2016-07-02 DIAGNOSIS — Z79899 Other long term (current) drug therapy: Secondary | ICD-10-CM | POA: Diagnosis not present

## 2016-07-03 LAB — BASIC METABOLIC PANEL
BUN: 16 mg/dL (ref 7–25)
CHLORIDE: 101 mmol/L (ref 98–110)
CO2: 28 mmol/L (ref 20–31)
CREATININE: 0.97 mg/dL — AB (ref 0.60–0.93)
Calcium: 9.4 mg/dL (ref 8.6–10.4)
Glucose, Bld: 93 mg/dL (ref 65–99)
Potassium: 5.1 mmol/L (ref 3.5–5.3)
Sodium: 138 mmol/L (ref 135–146)

## 2016-07-05 ENCOUNTER — Ambulatory Visit (INDEPENDENT_AMBULATORY_CARE_PROVIDER_SITE_OTHER): Payer: Medicare Other | Admitting: Pharmacist

## 2016-07-05 DIAGNOSIS — E782 Mixed hyperlipidemia: Secondary | ICD-10-CM

## 2016-07-05 NOTE — Patient Instructions (Signed)
Lipid Clinic [facility] [phone]   Cholesterol Cholesterol is a fat. Your body needs a small amount of cholesterol. Cholesterol (plaque) may build up in your blood vessels (arteries). That makes you more likely to have a heart attack or stroke. You cannot feel your cholesterol level. Having a blood test is the only way to find out if your level is high. Keep your test results. Work with your doctor to keep your cholesterol at a good level. What do the results mean?  Total cholesterol is how much cholesterol is in your blood.  LDL is bad cholesterol. This is the type that can build up. Try to have low LDL.  HDL is good cholesterol. It cleans your blood vessels and carries LDL away. Try to have high HDL.  Triglycerides are fat that the body can store or burn for energy. What are good levels of cholesterol?  Total cholesterol below 200.  LDL below 100 is good for people who have health risks. LDL below 70 is good for people who have very high risks.  HDL above 40 is good. It is best to have HDL of 60 or higher.  Triglycerides below 150. How can I lower my cholesterol? Diet  Follow your diet program as told by your doctor.  Choose fish, white meat chicken, or [place] that is roasted or baked. Try not to eat red meat, fried foods, sausage, or lunch meats.  Eat lots of fresh fruits and vegetables.  Choose whole grains, beans, pasta, potatoes, and cereals.  Choose olive oil, corn oil, or canola oil. Only use small amounts.  Try not to eat butter, mayonnaise, shortening, or palm kernel oils.  Try not to eat foods with trans fats.  Choose low-fat or nonfat dairy foods.  Drink skim or nonfat milk.  Eat low-fat or nonfat yogurt and cheeses.  Try not to drink whole milk or cream.  Try not to eat ice cream, egg yolks, or full-fat cheeses.  Healthy desserts include angel food cake, ginger snaps, animal crackers, hard candy, popsicles, and low-fat or nonfat frozen yogurt.  Try not to eat pastries, cakes, pies, and cookies. Exercise  Follow your exercise program as told by your doctor.  Be more active. Try gardening, walking, and taking the stairs.  Ask your doctor about ways that you can be more active. Medicine  Take over-the-counter and prescription medicines only as told by your doctor. This information is not intended to replace advice given to you by your health care provider. Make sure you discuss any questions you have with your health care provider. Document Released: 05/11/2008 Document Revised: 09/14/2015 Document Reviewed: 08/25/2015 Elsevier Interactive Patient Education  2017 [employer].

## 2016-07-05 NOTE — Progress Notes (Signed)
Patient ID: Lauren House                 DOB: 11/21/1935                    MRN: [id number]     HPI: Lauren House is a 81 y.o. female patient referred to lipid clinic by Dr Debara Pickett.  PMH includes apical hypertrophic, unstable angina, shortness of breath, cardiomyopathy, hypertension, hyperlipidemia, and CAD s/p stent 07/25/2015. Intolerant to multiple statins including rosuvastatin, atorvastatin, and simvastatin.  Also noted intolerance to Zetia due to upset GI. Patient presents to clinic for evaluation and potential initiation of PCSK9i.    Current Medications: none  Intolerances:  Atorvastatin - muscle pain Ezetimibe 10mg  - GI upset Simvastatin - muscle pain  Risk Factors: CAD s/p stent, hypertension, hyperlipidemia, unstable angina  LDL goal: < 70 mg/dL  Diet: decreasing snacks and sweets. No eating more salads and home cooked meals   Exercise: yard work and daily walks. Recently significantly deceased due to right knee injury  Social History: reports that she has never smoked. She has never used smokeless tobacco. She reports that she does not drink alcohol or use drugs.  Labs:  Ref. Range 11/03/2015 08:25  Total CHOL/HDL Ratio Latest Ref Range: <=5.0 Ratio 5.8 (H)  Cholesterol Latest Ref Range: 125 - 200 mg/dL 279 (H)  HDL Cholesterol Latest Ref Range: >=46 mg/dL 48  LDL (calc) Latest Ref Range: <130 mg/dL 188 (H)  Triglycerides Latest Ref Range: <150 mg/dL 216 (H)  VLDL Latest Ref Range: <30 mg/dL 43 (H)   Past Medical History:  Diagnosis Date  . Anxiety   . Apical variant hypertrophic cardiomyopathy ([place])   . Arthritis   . Borderline diabetes mellitus   . Bruises easily   . Bursitis of left hip   . Coronary atherosclerosis of native coronary artery    a. 2011: Cath showing nonobstructive CAD - 50-60% LAD (normal FFR) b. 06/2015: Cath showing 80% stenosis in the Mid LAD, tx w/ Promus DES 2.25 mm x 16 mm   . Essential hypertension, benign   . History of kidney stones     . History of skin cancer    Basal cell - abdomen  . Hyperlipidemia    Diet controlled.cannot take meds  . Nocturia   . Osteoporosis   . Overactive bladder   . Shortness of breath dyspnea   . Vertigo     Current Outpatient Prescriptions on File Prior to Visit  Medication Sig Dispense Refill  . aspirin EC 81 MG tablet Take 81 mg by mouth daily.    . chlorthalidone (HYGROTON) 25 MG tablet Take 0.5 tablets (12.5 mg total) by mouth daily. 45 tablet 3  . clopidogrel (PLAVIX) 75 MG tablet Take 75 mg by mouth daily.    . Cyanocobalamin (VITAMIN B12 PO) Take 1 tablet by mouth daily.    . fluticasone (FLONASE) 50 MCG/ACT nasal spray USE TWO SPRAY IN EACH NOSTRIL EVERY DAY 50 g 1  . metoprolol tartrate (LOPRESSOR) 25 MG tablet Take 0.5 tablets (12.5 mg total) by mouth 2 (two) times daily. 30 tablet 11  . Multiple Vitamins-Minerals (OCUVITE PO) Take 1 tablet by mouth daily.    . nitroGLYCERIN (NITROSTAT) 0.4 MG SL tablet Place 1 tablet (0.4 mg total) under the tongue every 5 (five) minutes as needed for chest pain. 25 tablet 3  . Polyethyl Glycol-Propyl Glycol (SYSTANE OP) Apply 1 drop to eye 3 (three) times daily as  needed (Dry eyes).    . ranolazine (RANEXA) 500 MG 12 hr tablet Take 1 tablet (500 mg total) by mouth 2 (two) times daily. 180 tablet 3   No current facility-administered medications on file prior to visit.     Allergies  Allergen Reactions  . Chlorhexidine Itching  . Vancomycin Hives  . Aspirin Other (See Comments)    Nose bleeds with full strength  . Atorvastatin Other (See Comments)    Muscle pain  . Bactrim [Sulfamethoxazole-Trimethoprim] Other (See Comments)    Doesn't remember   . Cephalosporins Hives and Itching  . Ciprofloxacin Nausea Only  . Clarithromycin Other (See Comments)    Doesn't remember   . Colestipol Hcl Other (See Comments)    Hallucinations  . Ezetimibe Other (See Comments)    GI upset  . Simvastatin Other (See Comments)    Muscle spasms  .  Penicillins Rash    Has patient had a PCN reaction causing immediate rash, facial/tongue/throat swelling, SOB or lightheadedness with hypotension: yes Has patient had a PCN reaction causing severe rash involving mucus membranes or skin necrosis: no Has patient had a PCN reaction that required hospitalization no Has patient had a PCN reaction occurring within the last 10 years: no If all of the above answers are "NO", then may proceed with Cephalosporin use.   . Tape Rash    Mixed hyperlipidemia:  PCSK9 inhibitors indication, storage and administration and some side effects were discussed during office visit. Patient is NOT interested on initiating any new therapy at this time but will discuss with daughter and call back if she decide to initiate Repatha/Praluent in the future.  Information about both PCSK9i options also provided in witting.   Yevette Knust Rodriguez-Guzman PharmD, [place] [id number] 07/05/2016 8:13 AM

## 2016-07-09 DIAGNOSIS — M1712 Unilateral primary osteoarthritis, left knee: Secondary | ICD-10-CM | POA: Diagnosis not present

## 2016-07-09 DIAGNOSIS — Z4789 Encounter for other orthopedic aftercare: Secondary | ICD-10-CM | POA: Diagnosis not present

## 2016-07-09 DIAGNOSIS — M7062 Trochanteric bursitis, left hip: Secondary | ICD-10-CM | POA: Diagnosis not present

## 2016-07-16 ENCOUNTER — Ambulatory Visit (HOSPITAL_COMMUNITY): Payer: Medicare Other | Attending: Physician Assistant | Admitting: Physical Therapy

## 2016-07-16 ENCOUNTER — Encounter (HOSPITAL_COMMUNITY): Payer: Self-pay | Admitting: Physical Therapy

## 2016-07-16 DIAGNOSIS — M25552 Pain in left hip: Secondary | ICD-10-CM | POA: Insufficient documentation

## 2016-07-16 DIAGNOSIS — R2689 Other abnormalities of gait and mobility: Secondary | ICD-10-CM | POA: Diagnosis not present

## 2016-07-16 DIAGNOSIS — M25561 Pain in right knee: Secondary | ICD-10-CM | POA: Diagnosis not present

## 2016-07-16 DIAGNOSIS — R2681 Unsteadiness on feet: Secondary | ICD-10-CM

## 2016-07-16 DIAGNOSIS — M6281 Muscle weakness (generalized): Secondary | ICD-10-CM | POA: Insufficient documentation
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
## 2016-07-18 ENCOUNTER — Encounter (HOSPITAL_COMMUNITY): Payer: Self-pay

## 2016-07-18 ENCOUNTER — Ambulatory Visit (HOSPITAL_COMMUNITY): Payer: Medicare Other

## 2016-07-18 DIAGNOSIS — R2681 Unsteadiness on feet: Secondary | ICD-10-CM

## 2016-07-18 DIAGNOSIS — M6281 Muscle weakness (generalized): Secondary | ICD-10-CM

## 2016-07-18 DIAGNOSIS — M25552 Pain in left hip: Secondary | ICD-10-CM | POA: Diagnosis not present

## 2016-07-18 DIAGNOSIS — M25561 Pain in right knee: Secondary | ICD-10-CM

## 2016-07-18 DIAGNOSIS — R2689 Other abnormalities of gait and mobility: Secondary | ICD-10-CM

## 2016-07-18 NOTE — Therapy (Signed)
[place], [place], [address] Phone: [phone]   Fax:  [phone]  Physical Therapy Treatment  Patient Details  Name: Lauren House MRN: [id number] Date of Birth: 02-Oct-1935 Referring Provider: Gerrit Halls, PA-C  Encounter Date: 07/18/2016      PT End of Session - 07/18/16 0901    Visit Number 2   Number of Visits 12   Date for PT Re-Evaluation 08/06/16   Authorization Type BCBS Medicare    Authorization Time Period 07/16/16 to 08/27/16   PT Start Time 0901   PT Stop Time 0941   PT Time Calculation (min) 40 min   Activity Tolerance Patient tolerated treatment well;No increased pain   Behavior During Therapy WFL for tasks assessed/performed      Past Medical History:  Diagnosis Date  . Anxiety   . Apical variant hypertrophic cardiomyopathy ([place])   . Arthritis   . Borderline diabetes mellitus   . Bruises easily   . Bursitis of left hip   . Coronary atherosclerosis of native coronary artery    a. 2011: Cath showing nonobstructive CAD - 50-60% LAD (normal FFR) b. 06/2015: Cath showing 80% stenosis in the Mid LAD, tx w/ Promus DES 2.25 mm x 16 mm   . Essential hypertension, benign   . History of kidney stones   . History of skin cancer    Basal cell - abdomen  . Hyperlipidemia    Diet controlled.cannot take meds  . Nocturia   . Osteoporosis   . Overactive bladder   . Shortness of breath dyspnea   . Vertigo     Past Surgical History:  Procedure Laterality Date  . ABDOMINAL HYSTERECTOMY    . CARDIAC CATHETERIZATION N/A 07/22/2015   Procedure: Left Heart Cath and Coronary Angiography;  Surgeon: Leonie Man, MD;  Location: [facility];  Service: Cardiovascular;  Laterality: N/A;  . CARDIAC CATHETERIZATION N/A 07/22/2015   Procedure: Coronary Stent Intervention;  Surgeon: Leonie Man, MD;  Location: [facility];  Service: Cardiovascular;  Laterality: N/A;  . CORONARY STENT  PLACEMENT  07/22/2015   mid lad  des   . EYE SURGERY     cataracts bilateral  . HEMORRHOID SURGERY    . INGUINAL HERNIA REPAIR  10/11/2011   Procedure: HERNIA REPAIR INGUINAL ADULT;  Surgeon: Stark Klein, MD;  Location: [facility];  Service: General;  Laterality: Right;  . OPEN SURGICAL REPAIR OF GLUTEAL TENDON Left 03/12/2014   Procedure: LEFT HIP BURSECTOMY AND GLUTEAL TENDON REPAIR;  Surgeon: [name], MD;  Location[facility];  Service: Orthopedics;  Laterality: Left;    There were no vitals filed for this visit.      Subjective Assessment - 07/18/16 0901    Subjective Pt states that she felt okay following her initial eval. She states she tried to do her HEP at home which went okay.    Pertinent History Lt gluteal repair/bursectomy 2016; inguinal hernia repair 2013; anxiety, apical variant hypertrophic cadiomyopathy, osteoporosis   How long can you sit comfortably? unlimited    Diagnostic tests Xray: negative per pt    Patient Stated Goals improve knee/hip pain and improve balance    Currently in Pain? No/denies            [facility] PT Assessment - 07/18/16 0001      AROM   Right Hip External Rotation  45   Right Hip Internal Rotation  36  Left Hip External Rotation  38   Left Hip Internal Rotation  25          [facility] Adult PT Treatment/Exercise - 07/18/16 0001      Knee/Hip Exercises: Stretches   Active Hamstring Stretch Right;2 reps;30 seconds     Knee/Hip Exercises: Seated   Sit to Sand 2 sets;10 reps;without UE support  with RTB, from chair     Knee/Hip Exercises: Supine   Bridges Both;2 sets;10 reps   Bridges Limitations with RTB     Knee/Hip Exercises: Sidelying   Clams 2 sets x 10 reps with RTB, BLE     Manual Therapy   Manual Therapy Soft tissue mobilization   Manual therapy comments completed separate rest of treatment   Soft tissue mobilization IASTM with ball to L gluteals and IASTM with stick to L VLO             PT Education - 07/18/16  0942    Education provided Yes   Education Details reviewed goals and pt did not have any f/u questions; exercise technique, updated HEP   Person(s) Educated Patient   Methods Explanation;Demonstration;Handout   Comprehension Verbalized understanding;Returned demonstration          PT Short Term Goals - 07/16/16 1051      PT SHORT TERM GOAL #1   Title Pt will demo consistency and independence with her HEP to improve her BLE strength and mobility   Time 2   Period Weeks   Status New     PT SHORT TERM GOAL #2   Title Pt will demo proper mechanics with stair negotiation, evident by her ability to ascend and descend atleast 12, 6" steps with reciprocal pattern and no more than 1 handrail for safety.    Time 2   Period Weeks   Status New     PT SHORT TERM GOAL #3   Title Pt will maintain SLS on each LE for atleast 15 sec, 2/3 trials, to improve her safety with functional activity at home.           PT Long Term Goals - 07/16/16 1055      PT LONG TERM GOAL #1   Title Pt will demo improved Lt hip abductor strength and endurance evident by her ability to maintain SLS on the Lt for atleast 10sec without a (+) trendelenburg sign, 2/3 trials.     Time 6   Period Weeks   Status New     PT LONG TERM GOAL #2   Title Pt will demo improved BLE strength to atleast 4/5 MMT which will improve her safety and mobility throughout the day.    Time 6   Period Weeks   Status New     PT LONG TERM GOAL #3   Title Pt will report atleast 75% improvement in her Lt hip pain with return to daily activity, to increase her independence and quality of life.    Time 6   Period Weeks   Status New               Plan - 07/18/16 0944    Clinical Impression Statement Reviewed goals set by evaluating therapist and pt did not have any f/u questions. pt's L hip ROM slightly limited compared to R. Pt responded well to manual therapy reporting decreased pain following IASTM to the LLE glutes and  VLO. Performed hip strengthening with pt which she did well with but did demo fatigue. Continue POC as  planned.   Rehab Potential Fair   Clinical Impairments Affecting Rehab Potential (-) Pt reporting history of poor compliant with HEP in the past, (+) reportedly highly motivated to improve her pain and mobility   PT Frequency 2x / week   PT Duration 6 weeks   PT Treatment/Interventions ADLs/Self Care Home Management;Cryotherapy;Gait training;Stair training;Functional mobility training;Therapeutic activities;Therapeutic exercise;Balance training;Neuromuscular re-education;Manual techniques;Patient/family education;Passive range of motion;Dry needling   PT Next Visit Plan Continue manual to address soft tissue restrictions along Lt distal glute max and vastuls lateralis; continue hip extensor/abductor strengthening; initiate hamstring strengthening; progress CKC hip strengthening   PT Home Exercise Plan Rolling Lt quad; supine bridge; 5/23: sidelying clams with RTB, add RTB to bridges   Consulted and Agree with Plan of Care Patient      Patient will benefit from skilled therapeutic intervention in order to improve the following deficits and impairments:  Abnormal gait, Decreased balance, Decreased activity tolerance, Decreased strength, Impaired flexibility, Increased muscle spasms, Pain, Improper body mechanics  Visit Diagnosis: Pain in left hip  Muscle weakness (generalized)  Unsteadiness on feet  Other abnormalities of gait and mobility  Right knee pain, unspecified chronicity     Problem List Patient Active Problem List   Diagnosis Date Noted  . CAD S/P percutaneous coronary angioplasty 07/22/2015  . DOE (dyspnea on exertion)   . Unstable angina ([place]) 07/12/2015  . Heart palpitations 02/14/2015  . Hip bursitis 03/13/2014  . Trochanteric bursitis of left hip 03/12/2014  . Exertional chest pain 02/10/2014  . Preoperative cardiovascular examination 02/10/2014  . Angina  pectoris, variant ([place]) 12/24/2012  . Left hip pain 12/16/2012  . Bursitis of left hip 12/16/2012  . Osteoarthritis, hip, bilateral 12/16/2012  . Chest pain 08/15/2012  . Apical variant hypertrophic cardiomyopathy ([place]) 08/15/2012  . Bilateral inguinal hernia, right symptomatic 09/21/2011  . COMEDO 10/18/2008  . POSTMENOPAUSAL OSTEOPOROSIS 09/01/2008  . HIP PAIN, LEFT 01/19/2008  . CARCINOMA, BASAL CELL, SKIN 10/02/2006  . VARICOSE VEIN 09/24/2006  . ACTINIC KERATOSIS 09/24/2006  . NECK PAIN 09/24/2006  . BACK PAIN 09/24/2006  . NEOP, BNG, SCALP/SKIN, NECK 04/16/2006  . FIBROIDS, UTERUS 04/16/2006  . Mixed hyperlipidemia 04/16/2006  . MACULAR DEGENERATION 04/16/2006  . Essential hypertension 04/16/2006  . ALLERGIC RHINITIS 04/16/2006  . OVERACTIVE BLADDER 04/16/2006  . URINARY INCONTINENCE 04/16/2006  . SKIN CANCER, HX OF 04/16/2006  . VERTIGO, HX OF 04/16/2006  . RENAL CALCULUS, HX OF 04/16/2006     Geraldine Solar PT, DPT   [place] [address] [place], [place], [address] Phone: [phone]   Fax:  [phone]  Name: Lauren House MRN: [id number] Date of Birth: 1936-01-05

## 2016-07-18 NOTE — Patient Instructions (Signed)
  ELASTIC BAND - SIDELYING CLAM-   While lying on your side with your knees bent and an elastic band wrapped around your knees, draw up the top knee while keeping contact of your feet together as shown.   Do not let your pelvis roll back during the lifting movement.    Perform 1-2x/day, 2-3 sets of 10 reps on each side with the red band    BRIDGING ELASTIC BAND ABDUCTION  While lying on your back, place an elastic band around your knees and pull your knees apart. Hold this and then tighten your lower abdominals, squeeze your buttocks and raise your buttocks off the floor/bed as creating a "Bridge" with your body.  Perform 1-2x/day, 2-3 sets of 10 reps with the red band-- hold for 2-3 seconds at the top.

## 2016-07-25 ENCOUNTER — Ambulatory Visit (HOSPITAL_COMMUNITY): Payer: Medicare Other

## 2016-07-25 DIAGNOSIS — R2681 Unsteadiness on feet: Secondary | ICD-10-CM

## 2016-07-25 DIAGNOSIS — M25552 Pain in left hip: Secondary | ICD-10-CM

## 2016-07-25 DIAGNOSIS — R2689 Other abnormalities of gait and mobility: Secondary | ICD-10-CM

## 2016-07-25 DIAGNOSIS — M6281 Muscle weakness (generalized): Secondary | ICD-10-CM

## 2016-07-25 DIAGNOSIS — M25561 Pain in right knee: Secondary | ICD-10-CM

## 2016-07-25 NOTE — Therapy (Signed)
[place], [place], [address] Phone: [phone]   Fax:  [phone]  Physical Therapy Treatment  Patient Details  Name: Lauren House MRN: [id number] Date of Birth: [birth year] Referring Provider: Gerrit Halls, PA-C  Encounter Date: 07/25/2016      PT End of Session - 07/25/16 0952    Visit Number 3   Number of Visits 12   Date for PT Re-Evaluation 08/06/16   Authorization Type BCBS Medicare    Authorization Time Period 07/16/16 to 08/27/16   PT Start Time 0946   PT Stop Time 1026   PT Time Calculation (min) 40 min   Activity Tolerance Patient tolerated treatment well;No increased pain   Behavior During Therapy WFL for tasks assessed/performed      Past Medical History:  Diagnosis Date  . Anxiety   . Apical variant hypertrophic cardiomyopathy ([place])   . Arthritis   . Borderline diabetes mellitus   . Bruises easily   . Bursitis of left hip   . Coronary atherosclerosis of native coronary artery    a. 2011: Cath showing nonobstructive CAD - 50-60% LAD (normal FFR) b. 06/2015: Cath showing 80% stenosis in the Mid LAD, tx w/ Promus DES 2.25 mm x 16 mm   . Essential hypertension, benign   . History of kidney stones   . History of skin cancer    Basal cell - abdomen  . Hyperlipidemia    Diet controlled.cannot take meds  . Nocturia   . Osteoporosis   . Overactive bladder   . Shortness of breath dyspnea   . Vertigo     Past Surgical History:  Procedure Laterality Date  . ABDOMINAL HYSTERECTOMY    . CARDIAC CATHETERIZATION N/A 07/22/2015   Procedure: Left Heart Cath and Coronary Angiography;  Surgeon: Leonie Man, MD;  Location: [facility];  Service: Cardiovascular;  Laterality: N/A;  . CARDIAC CATHETERIZATION N/A 07/22/2015   Procedure: Coronary Stent Intervention;  Surgeon: Leonie Man, MD;  Location: [facility];  Service: Cardiovascular;  Laterality: N/A;  . CORONARY STENT  PLACEMENT  07/22/2015   mid lad  des   . EYE SURGERY     cataracts bilateral  . HEMORRHOID SURGERY    . INGUINAL HERNIA REPAIR  10/11/2011   Procedure: HERNIA REPAIR INGUINAL ADULT;  Surgeon: Stark Klein, MD;  Location: [facility];  Service: General;  Laterality: Right;  . OPEN SURGICAL REPAIR OF GLUTEAL TENDON Left 03/12/2014   Procedure: LEFT HIP BURSECTOMY AND GLUTEAL TENDON REPAIR;  Surgeon: [name], MD;  Location: [facility];  Service: Orthopedics;  Laterality: Left;    There were no vitals filed for this visit.      Subjective Assessment - 07/25/16 0948    Subjective Pt stated she feels the weather affects her acheyness in hip today, pain scale 3/10.   Pertinent History Lt gluteal repair/bursectomy 2016; inguinal hernia repair 2013; anxiety, apical variant hypertrophic cadiomyopathy, osteoporosis   Patient Stated Goals improve knee/hip pain and improve balance    Currently in Pain? Yes   Pain Score 3    Pain Location Hip   Pain Orientation Left   Pain Descriptors / Indicators Aching   Pain Type Chronic pain   Pain Onset More than a month ago   Pain Frequency Intermittent   Aggravating Factors  prolonged sitting   Pain Relieving Factors Tylonel   Effect of Pain on Daily Activities tries not to let it  effect too much, does slow down.                         [facility] Adult PT Treatment/Exercise - 07/25/16 0001      Knee/Hip Exercises: Stretches   Active Hamstring Stretch Both;3 reps;30 seconds     Knee/Hip Exercises: Standing   SLS Rt 11", Lt 13"    Other Standing Knee Exercises tandem stance 2x 30"     Knee/Hip Exercises: Seated   Sit to Sand 10 reps;without UE support     Knee/Hip Exercises: Supine   Bridges Both;2 sets;15 reps   Bridges Limitations with RTB     Knee/Hip Exercises: Prone   Hamstring Curl 10 reps   Hamstring Curl Limitations RTB   Hip Extension 10 reps     Manual Therapy   Manual Therapy Soft tissue mobilization   Manual therapy  comments completed separate rest of treatment   Soft tissue mobilization Manual and IASTM with ball to L gluteals and IASTM with stick to L VLO;  Lt sidelying wiht pillow between knees                  PT Short Term Goals - 07/16/16 1051      PT SHORT TERM GOAL #1   Title Pt will demo consistency and independence with her HEP to improve her BLE strength and mobility   Time 2   Period Weeks   Status New     PT SHORT TERM GOAL #2   Title Pt will demo proper mechanics with stair negotiation, evident by her ability to ascend and descend atleast 12, 6" steps with reciprocal pattern and no more than 1 handrail for safety.    Time 2   Period Weeks   Status New     PT SHORT TERM GOAL #3   Title Pt will maintain SLS on each LE for atleast 15 sec, 2/3 trials, to improve her safety with functional activity at home.           PT Long Term Goals - 07/16/16 1055      PT LONG TERM GOAL #1   Title Pt will demo improved Lt hip abductor strength and endurance evident by her ability to maintain SLS on the Lt for atleast 10sec without a (+) trendelenburg sign, 2/3 trials.     Time 6   Period Weeks   Status New     PT LONG TERM GOAL #2   Title Pt will demo improved BLE strength to atleast 4/5 MMT which will improve her safety and mobility throughout the day.    Time 6   Period Weeks   Status New     PT LONG TERM GOAL #3   Title Pt will report atleast 75% improvement in her Lt hip pain with return to daily activity, to increase her independence and quality of life.    Time 6   Period Weeks   Status New               Plan - 07/25/16 1027    Clinical Impression Statement Added prone hamstring and gluteal strengthening with min cueing for proper form and control with new task.  Manual technqiues complete while Rt sidelying and pillow between knees to address muscle restrictions Lt glut and VLO.  No reports of increased pain through session.  Pt was limited by fatigue.      Rehab Potential Fair   Clinical Impairments Affecting Rehab  Potential (-) Pt reporting history of poor compliant with HEP in the past, (+) reportedly highly motivated to improve her pain and mobility   PT Frequency 2x / week   PT Duration 6 weeks   PT Treatment/Interventions ADLs/Self Care Home Management;Cryotherapy;Gait training;Stair training;Functional mobility training;Therapeutic activities;Therapeutic exercise;Balance training;Neuromuscular re-education;Manual techniques;Patient/family education;Passive range of motion;Dry needling   PT Next Visit Plan Continue manual to address soft tissue restrictions along Lt distal glute max and vastuls lateralis; continue hip extensor/abductor strengthening; initiate hamstring strengthening; progress CKC hip strengthening   PT Home Exercise Plan Rolling Lt quad; supine bridge; 5/23: sidelying clams with RTB, add RTB to bridges      Patient will benefit from skilled therapeutic intervention in order to improve the following deficits and impairments:  Abnormal gait, Decreased balance, Decreased activity tolerance, Decreased strength, Impaired flexibility, Increased muscle spasms, Pain, Improper body mechanics  Visit Diagnosis: Pain in left hip  Muscle weakness (generalized)  Unsteadiness on feet  Other abnormalities of gait and mobility  Right knee pain, unspecified chronicity     Problem List Patient Active Problem List   Diagnosis Date Noted  . CAD S/P percutaneous coronary angioplasty 07/22/2015  . DOE (dyspnea on exertion)   . Unstable angina ([facility]) 07/12/2015  . Heart palpitations 02/14/2015  . Hip bursitis 03/13/2014  . Trochanteric bursitis of left hip 03/12/2014  . Exertional chest pain 02/10/2014  . Preoperative cardiovascular examination 02/10/2014  . Angina pectoris, variant ([facility]) 12/24/2012  . Left hip pain 12/16/2012  . Bursitis of left hip 12/16/2012  . Osteoarthritis, hip, bilateral 12/16/2012  . Chest pain 08/15/2012   . Apical variant hypertrophic cardiomyopathy ([place]) 08/15/2012  . Bilateral inguinal hernia, right symptomatic 09/21/2011  . COMEDO 10/18/2008  . POSTMENOPAUSAL OSTEOPOROSIS 09/01/2008  . HIP PAIN, LEFT 01/19/2008  . CARCINOMA, BASAL CELL, SKIN 10/02/2006  . VARICOSE VEIN 09/24/2006  . ACTINIC KERATOSIS 09/24/2006  . NECK PAIN 09/24/2006  . BACK PAIN 09/24/2006  . NEOP, BNG, SCALP/SKIN, NECK 04/16/2006  . FIBROIDS, UTERUS 04/16/2006  . Mixed hyperlipidemia 04/16/2006  . MACULAR DEGENERATION 04/16/2006  . Essential hypertension 04/16/2006  . ALLERGIC RHINITIS 04/16/2006  . OVERACTIVE BLADDER 04/16/2006  . URINARY INCONTINENCE 04/16/2006  . SKIN CANCER, HX OF 04/16/2006  . VERTIGO, HX OF 04/16/2006  . RENAL CALCULUS, HX OF 04/16/2006   Ihor Austin, LPTA; [facility] [place]  Aldona Lento 07/25/2016, 10:32 AM  [facility] [address] [place], [place], [address] Phone: [phone]   Fax:  [phone]  Name: MAGDALYNN DAVILLA MRN: [id number] Date of Birth: 01/25/1936

## 2016-07-27 ENCOUNTER — Encounter (HOSPITAL_COMMUNITY): Payer: Self-pay

## 2016-07-27 ENCOUNTER — Ambulatory Visit (HOSPITAL_COMMUNITY): Payer: Medicare Other | Attending: Physician Assistant

## 2016-07-27 DIAGNOSIS — M7062 Trochanteric bursitis, left hip: Secondary | ICD-10-CM | POA: Diagnosis not present

## 2016-07-27 DIAGNOSIS — M25552 Pain in left hip: Secondary | ICD-10-CM | POA: Diagnosis not present

## 2016-07-27 DIAGNOSIS — M25561 Pain in right knee: Secondary | ICD-10-CM | POA: Diagnosis not present

## 2016-07-27 DIAGNOSIS — R2689 Other abnormalities of gait and mobility: Secondary | ICD-10-CM | POA: Diagnosis not present

## 2016-07-27 DIAGNOSIS — R2681 Unsteadiness on feet: Secondary | ICD-10-CM | POA: Diagnosis not present

## 2016-07-27 DIAGNOSIS — M6281 Muscle weakness (generalized): Secondary | ICD-10-CM | POA: Insufficient documentation

## 2016-07-27 NOTE — Therapy (Signed)
[place], [place], [address] Phone: [phone]   Fax:  [phone]  Physical Therapy Treatment  Patient Details  Name: Lauren House MRN: [id number] Date of Birth: 28-Jul-1935 Referring Provider: Gerrit Halls, PA-C  Encounter Date: 07/27/2016      PT End of Session - 07/27/16 0951    Visit Number 4   Number of Visits 12   Date for PT Re-Evaluation 08/06/16   Authorization Type BCBS Medicare    Authorization Time Period 07/16/16 to 08/27/16   PT Start Time 0950   PT Stop Time 1030   PT Time Calculation (min) 40 min   Activity Tolerance Patient tolerated treatment well;No increased pain   Behavior During Therapy WFL for tasks assessed/performed      Past Medical History:  Diagnosis Date  . Anxiety   . Apical variant hypertrophic cardiomyopathy ([place])   . Arthritis   . Borderline diabetes mellitus   . Bruises easily   . Bursitis of left hip   . Coronary atherosclerosis of native coronary artery    a. 2011: Cath showing nonobstructive CAD - 50-60% LAD (normal FFR) b. 06/2015: Cath showing 80% stenosis in the Mid LAD, tx w/ Promus DES 2.25 mm x 16 mm   . Essential hypertension, benign   . History of kidney stones   . History of skin cancer    Basal cell - abdomen  . Hyperlipidemia    Diet controlled.cannot take meds  . Nocturia   . Osteoporosis   . Overactive bladder   . Shortness of breath dyspnea   . Vertigo     Past Surgical History:  Procedure Laterality Date  . ABDOMINAL HYSTERECTOMY    . CARDIAC CATHETERIZATION N/A 07/22/2015   Procedure: Left Heart Cath and Coronary Angiography;  Surgeon: Leonie Man, MD;  Location: [facility];  Service: Cardiovascular;  Laterality: N/A;  . CARDIAC CATHETERIZATION N/A 07/22/2015   Procedure: Coronary Stent Intervention;  Surgeon: Leonie Man, MD;  Location: [facility];  Service: Cardiovascular;  Laterality: N/A;  . CORONARY STENT  PLACEMENT  07/22/2015   mid lad  des   . EYE SURGERY     cataracts bilateral  . HEMORRHOID SURGERY    . INGUINAL HERNIA REPAIR  10/11/2011   Procedure: HERNIA REPAIR INGUINAL ADULT;  Surgeon: Stark Klein, MD;  Location: [facility];  Service: General;  Laterality: Right;  . OPEN SURGICAL REPAIR OF GLUTEAL TENDON Left 03/12/2014   Procedure: LEFT HIP BURSECTOMY AND GLUTEAL TENDON REPAIR;  Surgeon: [name], MD;  Location[facility];  Service: Orthopedics;  Laterality: Left;    There were no vitals filed for this visit.      Subjective Assessment - 07/27/16 0951    Subjective Pt states that she feels sort of under the weather today, her stomach is bothering her a little bit.   Pertinent History Lt gluteal repair/bursectomy 2016; inguinal hernia repair 2013; anxiety, apical variant hypertrophic cadiomyopathy, osteoporosis   Patient Stated Goals improve knee/hip pain and improve balance    Currently in Pain? No/denies   Pain Onset More than a month ago               [facility] Adult PT Treatment/Exercise - 07/27/16 0001      Knee/Hip Exercises: Standing   Lateral Step Up Both;1 set;10 reps;Hand Hold: 0;Step Height: 6"   Forward Step Up Both;1 set;10 reps;Step Height: 6";Hand Hold: 0   SLS  with Vectors x 4 on LLE     Knee/Hip Exercises: Supine   Bridges Both;2 sets;10 reps   Bridges Limitations with RTB, holding for 2-3 sec at the top     Knee/Hip Exercises: Sidelying   Hip ABduction Both;2 sets;10 reps   Hip ABduction Limitations increased difficulty with LLE     Knee/Hip Exercises: Prone   Hamstring Curl 2 sets;10 reps   Hamstring Curl Limitations 2#     Manual Therapy   Manual Therapy Soft tissue mobilization   Manual therapy comments completed separate rest of treatment   Soft tissue mobilization IASTM with the stick to L VLO, ITB, TFL, and glutes x 12 mins                  PT Short Term Goals - 07/16/16 1051      PT SHORT TERM GOAL #1   Title Pt will demo  consistency and independence with her HEP to improve her BLE strength and mobility   Time 2   Period Weeks   Status New     PT SHORT TERM GOAL #2   Title Pt will demo proper mechanics with stair negotiation, evident by her ability to ascend and descend atleast 12, 6" steps with reciprocal pattern and no more than 1 handrail for safety.    Time 2   Period Weeks   Status New     PT SHORT TERM GOAL #3   Title Pt will maintain SLS on each LE for atleast 15 sec, 2/3 trials, to improve her safety with functional activity at home.           PT Long Term Goals - 07/16/16 1055      PT LONG TERM GOAL #1   Title Pt will demo improved Lt hip abductor strength and endurance evident by her ability to maintain SLS on the Lt for atleast 10sec without a (+) trendelenburg sign, 2/3 trials.     Time 6   Period Weeks   Status New     PT LONG TERM GOAL #2   Title Pt will demo improved BLE strength to atleast 4/5 MMT which will improve her safety and mobility throughout the day.    Time 6   Period Weeks   Status New     PT LONG TERM GOAL #3   Title Pt will report atleast 75% improvement in her Lt hip pain with return to daily activity, to increase her independence and quality of life.    Time 6   Period Weeks   Status New               Plan - 07/27/16 1031    Clinical Impression Statement Pt tolerated progressed therex and manual well this date. She demo'd a lot of difficulty with sidelying hip abd on the L, but did not report any hip pain with it. She did well with CKC strengthening but did demo fatigue by EOS. During SLS with vectors, pt c/o dizziness and wished to sit down. Pt stated she had heart problems at baseline; her HR was 62bpm. She denied chest pains, only SOB. After a few min rest break, pt reported feeling better. Continue POC as planned.   Rehab Potential Fair   Clinical Impairments Affecting Rehab Potential (-) Pt reporting history of poor compliant with HEP in the past,  (+) reportedly highly motivated to improve her pain and mobility   PT Frequency 2x / week   PT Duration 6 weeks  PT Treatment/Interventions ADLs/Self Care Home Management;Cryotherapy;Gait training;Stair training;Functional mobility training;Therapeutic activities;Therapeutic exercise;Balance training;Neuromuscular re-education;Manual techniques;Patient/family education;Passive range of motion;Dry needling   PT Next Visit Plan Continue manual to VLO, ITB, TFL, and glutes; manual hip distraction if presents with hip pain, progress hip strengthening, CKC strengthening, as well as dynamic stability of hips   PT Home Exercise Plan Rolling Lt quad; supine bridge; 5/23: sidelying clams with RTB, add RTB to bridges   Consulted and Agree with Plan of Care Patient      Patient will benefit from skilled therapeutic intervention in order to improve the following deficits and impairments:  Abnormal gait, Decreased balance, Decreased activity tolerance, Decreased strength, Impaired flexibility, Increased muscle spasms, Pain, Improper body mechanics  Visit Diagnosis: Pain in left hip  Muscle weakness (generalized)  Unsteadiness on feet  Other abnormalities of gait and mobility     Problem List Patient Active Problem List   Diagnosis Date Noted  . CAD S/P percutaneous coronary angioplasty 07/22/2015  . DOE (dyspnea on exertion)   . Unstable angina ([place]) 07/12/2015  . Heart palpitations 02/14/2015  . Hip bursitis 03/13/2014  . Trochanteric bursitis of left hip 03/12/2014  . Exertional chest pain 02/10/2014  . Preoperative cardiovascular examination 02/10/2014  . Angina pectoris, variant ([place]) 12/24/2012  . Left hip pain 12/16/2012  . Bursitis of left hip 12/16/2012  . Osteoarthritis, hip, bilateral 12/16/2012  . Chest pain 08/15/2012  . Apical variant hypertrophic cardiomyopathy ([place]) 08/15/2012  . Bilateral inguinal hernia, right symptomatic 09/21/2011  . COMEDO 10/18/2008  .  POSTMENOPAUSAL OSTEOPOROSIS 09/01/2008  . HIP PAIN, LEFT 01/19/2008  . CARCINOMA, BASAL CELL, SKIN 10/02/2006  . VARICOSE VEIN 09/24/2006  . ACTINIC KERATOSIS 09/24/2006  . NECK PAIN 09/24/2006  . BACK PAIN 09/24/2006  . NEOP, BNG, SCALP/SKIN, NECK 04/16/2006  . FIBROIDS, UTERUS 04/16/2006  . Mixed hyperlipidemia 04/16/2006  . MACULAR DEGENERATION 04/16/2006  . Essential hypertension 04/16/2006  . ALLERGIC RHINITIS 04/16/2006  . OVERACTIVE BLADDER 04/16/2006  . URINARY INCONTINENCE 04/16/2006  . SKIN CANCER, HX OF 04/16/2006  . VERTIGO, HX OF 04/16/2006  . RENAL CALCULUS, HX OF 04/16/2006     Geraldine Solar PT, DPT   [facility] [address] [place], [place], [address] Phone: [phone]   Fax:  [phone]  Name: MYRL BYNUM MRN: [id number] Date of Birth: 11-06-1935

## 2016-07-31 ENCOUNTER — Encounter (HOSPITAL_COMMUNITY): Payer: Medicare Other

## 2016-08-02 ENCOUNTER — Encounter (HOSPITAL_COMMUNITY): Payer: Medicare Other

## 2016-08-06 ENCOUNTER — Telehealth (HOSPITAL_COMMUNITY): Payer: Self-pay | Admitting: Family Medicine

## 2016-08-06 NOTE — Telephone Encounter (Signed)
08/06/16 pt cx this week... She fell over the weekend and has to go to her eye dr tomorrow and to the dentis on 6/14 so she said she would just return next week

## 2016-08-07 ENCOUNTER — Encounter: Payer: Self-pay | Admitting: Family Medicine

## 2016-08-07 ENCOUNTER — Ambulatory Visit (INDEPENDENT_AMBULATORY_CARE_PROVIDER_SITE_OTHER): Payer: Medicare Other | Admitting: Family Medicine

## 2016-08-07 ENCOUNTER — Ambulatory Visit (HOSPITAL_COMMUNITY): Payer: Medicare Other

## 2016-08-07 ENCOUNTER — Other Ambulatory Visit: Payer: Self-pay | Admitting: Internal Medicine

## 2016-08-07 VITALS — BP 140/80 | HR 80 | Temp 98.8°F | Resp 14 | Ht 67.0 in | Wt 147.0 lb

## 2016-08-07 DIAGNOSIS — J208 Acute bronchitis due to other specified organisms: Secondary | ICD-10-CM

## 2016-08-07 DIAGNOSIS — S0512XA Contusion of eyeball and orbital tissues, left eye, initial encounter: Secondary | ICD-10-CM

## 2016-08-07 MED ORDER — AZITHROMYCIN 250 MG PO TABS: ORAL_TABLET | ORAL | 0 refills | Status: DC

## 2016-08-07 MED ORDER — BENZONATATE 100 MG PO CAPS: 200.0000 mg | ORAL_CAPSULE | Freq: Three times a day (TID) | ORAL | 0 refills | Status: DC | PRN

## 2016-08-07 NOTE — Progress Notes (Signed)
Subjective:    Patient ID: Lauren House, female    DOB: [birth year], 81 y.o.   MRN: [id number]  HPI Patient fell Saturday and landed on her outstretched arms also striking her left cheekbone and lower left orbit against the ground. She now has a small hematoma in this area. Aside from the swelling and hematoma, there is no palpable deformity. She denies any blurry vision, double vision, pain in her eye. She has normal extraocular movements. She saw her eye specialist yesterday and her eye exam was normal outside of the bruising. There is some swelling and bruising on her lower left orbit but otherwise her exam is unremarkable. She does report a cough now for 1 week that is gradually getting worse. Last night she was coughing so hard she contemplated going to the emergency room. She also reports increasing shortness of breath. On examination today, she does have faint left basilar Rales. She reports subjective fevers. Past Medical History:  Diagnosis Date  . Anxiety   . Apical variant hypertrophic cardiomyopathy ([place])   . Arthritis   . Borderline diabetes mellitus   . Bruises easily   . Bursitis of left hip   . Coronary atherosclerosis of native coronary artery    a. 2011: Cath showing nonobstructive CAD - 50-60% LAD (normal FFR) b. 06/2015: Cath showing 80% stenosis in the Mid LAD, tx w/ Promus DES 2.25 mm x 16 mm   . Essential hypertension, benign   . History of kidney stones   . History of skin cancer    Basal cell - abdomen  . Hyperlipidemia    Diet controlled.cannot take meds  . Nocturia   . Osteoporosis   . Overactive bladder   . Shortness of breath dyspnea   . Vertigo    Past Surgical History:  Procedure Laterality Date  . ABDOMINAL HYSTERECTOMY    . CARDIAC CATHETERIZATION N/A 07/22/2015   Procedure: Left Heart Cath and Coronary Angiography;  Surgeon: Leonie Man, MD;  Location: [facility];  Service: Cardiovascular;  Laterality: N/A;  . CARDIAC CATHETERIZATION  N/A 07/22/2015   Procedure: Coronary Stent Intervention;  Surgeon: Leonie Man, MD;  Location: [facility];  Service: Cardiovascular;  Laterality: N/A;  . CORONARY STENT PLACEMENT  07/22/2015   mid lad  des   . EYE SURGERY     cataracts bilateral  . HEMORRHOID SURGERY    . INGUINAL HERNIA REPAIR  10/11/2011   Procedure: HERNIA REPAIR INGUINAL ADULT;  Surgeon: Stark Klein, MD;  Location: [facility];  Service: General;  Laterality: Right;  . OPEN SURGICAL REPAIR OF GLUTEAL TENDON Left 03/12/2014   Procedure: LEFT HIP BURSECTOMY AND GLUTEAL TENDON REPAIR;  Surgeon: [name], MD;  Location: [facility];  Service: Orthopedics;  Laterality: Left;   Current Outpatient Prescriptions on File Prior to Visit  Medication Sig Dispense Refill  . aspirin EC 81 MG tablet Take 81 mg by mouth daily.    . chlorthalidone (HYGROTON) 25 MG tablet Take 0.5 tablets (12.5 mg total) by mouth daily. 45 tablet 3  . clopidogrel (PLAVIX) 75 MG tablet Take 75 mg by mouth daily.    . Cyanocobalamin (VITAMIN B12 PO) Take 1 tablet by mouth daily.    . fluticasone (FLONASE) 50 MCG/ACT nasal spray USE TWO SPRAY IN EACH NOSTRIL EVERY DAY 50 g 1  . metoprolol tartrate (LOPRESSOR) 25 MG tablet Take 0.5 tablets (12.5 mg total) by mouth 2 (two) times daily. 30 tablet 11  .  Multiple Vitamins-Minerals (OCUVITE PO) Take 1 tablet by mouth daily.    . nitroGLYCERIN (NITROSTAT) 0.4 MG SL tablet Place 1 tablet (0.4 mg total) under the tongue every 5 (five) minutes as needed for chest pain. 25 tablet 3  . Polyethyl Glycol-Propyl Glycol (SYSTANE OP) Apply 1 drop to eye 3 (three) times daily as needed (Dry eyes).    . ranolazine (RANEXA) 500 MG 12 hr tablet Take 1 tablet (500 mg total) by mouth 2 (two) times daily. 180 tablet 3   No current facility-administered medications on file prior to visit.    Allergies  Allergen Reactions  . Chlorhexidine Itching  . Vancomycin Hives  . Aspirin Other (See Comments)    Nose bleeds with  full strength  . Atorvastatin Other (See Comments)    Muscle pain  . Bactrim [Sulfamethoxazole-Trimethoprim] Other (See Comments)    Doesn't remember   . Cephalosporins Hives and Itching  . Ciprofloxacin Nausea Only  . Clarithromycin Other (See Comments)    Doesn't remember   . Colestipol Hcl Other (See Comments)    Hallucinations  . Ezetimibe Other (See Comments)    GI upset  . Simvastatin Other (See Comments)    Muscle spasms  . Penicillins Rash    Has patient had a PCN reaction causing immediate rash, facial/tongue/throat swelling, SOB or lightheadedness with hypotension: yes Has patient had a PCN reaction causing severe rash involving mucus membranes or skin necrosis: no Has patient had a PCN reaction that required hospitalization no Has patient had a PCN reaction occurring within the last 10 years: no If all of the above answers are "NO", then may proceed with Cephalosporin use.   . Tape Rash   Social History   Social History  . Marital status: Widowed    Spouse name: N/A  . Number of children: N/A  . Years of education: N/A   Occupational History  . Not on file.   Social History Main Topics  . Smoking status: Never Smoker  . Smokeless tobacco: Never Used  . Alcohol use No  . Drug use: No  . Sexual activity: No   Other Topics Concern  . Not on file   Social History Narrative  . No narrative on file   Family History  Problem Relation Age of Onset  . Heart disease Father   . Lung cancer Sister   . Lung cancer Brother       Review of Systems  Respiratory: Positive for cough.   All other systems reviewed and are negative.      Objective:   Physical Exam  Constitutional: She is oriented to person, place, and time. She appears well-developed and well-nourished. No distress.  HENT:  Head: Normocephalic and atraumatic.  Right Ear: External ear normal.  Left Ear: External ear normal.  Nose: Nose normal.  Mouth/Throat: Oropharynx is clear and moist.  No oropharyngeal exudate.  Eyes: Conjunctivae and EOM are normal. Pupils are equal, round, and reactive to light. Right eye exhibits no discharge. Left eye exhibits no discharge. Right conjunctiva is not injected. Right conjunctiva has no hemorrhage. Left conjunctiva is not injected. Left conjunctiva has no hemorrhage. No scleral icterus. Right eye exhibits normal extraocular motion. Left eye exhibits normal extraocular motion.  Cardiovascular: Normal rate, regular rhythm, normal heart sounds and intact distal pulses.  Exam reveals no gallop and no friction rub.   No murmur heard. Pulmonary/Chest: Effort normal. No respiratory distress. She has no wheezes. She has rales. She exhibits no tenderness.  Abdominal: Soft.  Musculoskeletal: Normal range of motion. She exhibits no edema, tenderness or deformity.  Neurological: She is alert and oriented to person, place, and time. She has normal reflexes. No cranial nerve deficit. She exhibits normal muscle tone. Coordination normal.  Skin: She is not diaphoretic.  Vitals reviewed.  Bruise under her left eye       Assessment & Plan:  Acute bronchitis due to other specified organisms - Plan: azithromycin (ZITHROMAX) 250 MG tablet  Traumatic hematoma of left orbit, initial encounter  The hematoma should resolve spontaneously. I see no evidence of a displaced fracture. There is also no evidence of a skull fracture or concussion area there are no neurologic deficits on her exam. Recommended tincture of time area and I am concerned she is developing bronchitis which is gradually worsening over one week. Begin a Z-Pak with Tessalon Perles 200 mg every 8 hours as needed for cough.

## 2016-08-09 ENCOUNTER — Encounter (HOSPITAL_COMMUNITY): Payer: Medicare Other

## 2016-08-14 ENCOUNTER — Encounter (HOSPITAL_COMMUNITY): Payer: Self-pay

## 2016-08-14 ENCOUNTER — Ambulatory Visit (HOSPITAL_COMMUNITY): Payer: Medicare Other

## 2016-08-14 DIAGNOSIS — R2689 Other abnormalities of gait and mobility: Secondary | ICD-10-CM | POA: Diagnosis not present

## 2016-08-14 DIAGNOSIS — M25561 Pain in right knee: Secondary | ICD-10-CM | POA: Diagnosis not present

## 2016-08-14 DIAGNOSIS — M6281 Muscle weakness (generalized): Secondary | ICD-10-CM

## 2016-08-14 DIAGNOSIS — R2681 Unsteadiness on feet: Secondary | ICD-10-CM | POA: Diagnosis not present

## 2016-08-14 DIAGNOSIS — M7062 Trochanteric bursitis, left hip: Secondary | ICD-10-CM | POA: Diagnosis not present

## 2016-08-14 DIAGNOSIS — M25552 Pain in left hip: Secondary | ICD-10-CM | POA: Diagnosis not present

## 2016-08-14 NOTE — Therapy (Signed)
[place], [place], [address] Phone: [phone]   Fax:  [phone]  Physical Therapy Treatment/Reassessment  Patient Details  Name: Lauren House MRN: [id number] Date of Birth: 05-Oct-1935 Referring Provider: Gerrit Halls, PA-C  Encounter Date: 08/14/2016      PT End of Session - 08/14/16 0956    Visit Number 5   Number of Visits 12   Date for PT Re-Evaluation 09/11/16   Authorization Type BCBS Medicare    Authorization Time Period 08/14/16 to 09/11/16   PT Start Time 0949   PT Stop Time 1024   PT Time Calculation (min) 35 min   Activity Tolerance Patient tolerated treatment well;No increased pain   Behavior During Therapy WFL for tasks assessed/performed      Past Medical History:  Diagnosis Date  . Anxiety   . Apical variant hypertrophic cardiomyopathy ([place])   . Arthritis   . Borderline diabetes mellitus   . Bruises easily   . Bursitis of left hip   . Coronary atherosclerosis of native coronary artery    a. 2011: Cath showing nonobstructive CAD - 50-60% LAD (normal FFR) b. 06/2015: Cath showing 80% stenosis in the Mid LAD, tx w/ Promus DES 2.25 mm x 16 mm   . Essential hypertension, benign   . History of kidney stones   . History of skin cancer    Basal cell - abdomen  . Hyperlipidemia    Diet controlled.cannot take meds  . Nocturia   . Osteoporosis   . Overactive bladder   . Shortness of breath dyspnea   . Vertigo     Past Surgical History:  Procedure Laterality Date  . ABDOMINAL HYSTERECTOMY    . CARDIAC CATHETERIZATION N/A 07/22/2015   Procedure: Left Heart Cath and Coronary Angiography;  Surgeon: Leonie Man, MD;  Location: [facility];  Service: Cardiovascular;  Laterality: N/A;  . CARDIAC CATHETERIZATION N/A 07/22/2015   Procedure: Coronary Stent Intervention;  Surgeon: Leonie Man, MD;  Location: [facility];  Service: Cardiovascular;  Laterality: N/A;  .  CORONARY STENT PLACEMENT  07/22/2015   mid lad  des   . EYE SURGERY     cataracts bilateral  . HEMORRHOID SURGERY    . INGUINAL HERNIA REPAIR  10/11/2011   Procedure: HERNIA REPAIR INGUINAL ADULT;  Surgeon: Stark Klein, MD;  Location: [facility];  Service: General;  Laterality: Right;  . OPEN SURGICAL REPAIR OF GLUTEAL TENDON Left 03/12/2014   Procedure: LEFT HIP BURSECTOMY AND GLUTEAL TENDON REPAIR;  Surgeon: [name], MD;  Location: [facility];  Service: Orthopedics;  Laterality: Left;    There were no vitals filed for this visit.      Subjective Assessment - 08/14/16 0952    Subjective Pt states  she fell on 6/9 when she was working outside. She thinks that she got really hot, blacked out and fell and hit her head on the concrete. She went to her eye doctor on Monday who sent her to her PCP and the PCP found pneumonia. She states she is feeling much better now/getting back to normal, she just gets a little SOB every now and then.   Pertinent History Lt gluteal repair/bursectomy 2016; inguinal hernia repair 2013; anxiety, apical variant hypertrophic cadiomyopathy, osteoporosis   Patient Stated Goals improve knee/hip pain and improve balance    Currently in Pain? No/denies   Pain Onset More than a month ago  [facility] PT Assessment - 08/14/16 0001      Strength   Right Hip Flexion 4-/5  was 3+   Right Hip Extension 4-/5  was 3+   Right Hip ABduction 4-/5  was 3+   Left Hip Flexion 4/5  was 3+; min pain in hip   Left Hip Extension 2+/5  was 3+   Left Hip ABduction 3+/5  was 3; + pain   Right Knee Flexion 4-/5   Right Knee Extension 5/5   Left Knee Flexion 5/5   Left Knee Extension 5/5     Balance   Balance Assessed Yes     Static Standing Balance   Static Standing - Balance Support No upper extremity supported   Static Standing Balance -  Activities  Single Leg Stance - Right Leg;Single Leg Stance - Left Leg   Static Standing - Comment/# of Minutes R:  L:                      [facility] Adult PT Treatment/Exercise - 08/14/16 0001      Knee/Hip Exercises: Standing   Hip Abduction Both;2 sets;10 reps   Abduction Limitations RTB   SLS BLE, 3 reps x 10 sec each with intermittent UE support     Knee/Hip Exercises: Seated   Sit to Sand 10 reps;without UE support  RTB around knees                PT Education - 08/14/16 1024    Education provided Yes   Education Details reassessment findings, POC   Person(s) Educated Patient   Methods Explanation;Demonstration   Comprehension Verbalized understanding;Returned demonstration          PT Short Term Goals - 08/14/16 0956      PT SHORT TERM GOAL #1   Title Pt will demo consistency and independence with her HEP to improve her BLE strength and mobility   Baseline 6/19: was performing HEP consistently prior to fall and bout with pneumonia   Time 2   Period Weeks   Status Partially Met     PT SHORT TERM GOAL #2   Title Pt will demo proper mechanics with stair negotiation, evident by her ability to ascend and descend atleast 12, 6" steps with reciprocal pattern and no more than 1 handrail for safety.    Baseline 6/19: pt had 2-3 minor staggers when ascending/descending stairs   Time 2   Period Weeks   Status Partially Met     PT SHORT TERM GOAL #3   Title Pt will maintain SLS on each LE for atleast 15 sec, 2/3 trials, to improve her safety with functional activity at home.   Baseline 6/19: R: 11, 12, 3 seconds; L: 12.5, 12, 7 seconds   Status On-going           PT Long Term Goals - 08/14/16 0957      PT LONG TERM GOAL #1   Title Pt will demo improved Lt hip abductor strength and endurance evident by her ability to maintain SLS on the Lt for atleast 10sec without a (+) trendelenburg sign, 2/3 trials.     Baseline 6/19: able to perform SLS for 12.5 and 12 seconds, but had mild trendelenberg during 2nd trial   Time 6   Period Weeks   Status Partially Met     PT  LONG TERM GOAL #2   Title Pt will demo improved BLE strength to atleast 4/5 MMT which will improve  her safety and mobility throughout the day.    Time 6   Period Weeks   Status On-going     PT LONG TERM GOAL #3   Title Pt will report atleast 75% improvement in her Lt hip pain with return to daily activity, to increase her independence and quality of life.    Baseline 31-Aug-2022: it had until she fell on her L side   Time 6   Period Weeks   Status On-going               Plan - August 30, 2016 1025    Clinical Impression Statement PT reassessed pt's goals and outcome measures this date. She has partially met 3/6 goals while all others are on-going. Despite pt's recent fall and bout with pneumonia, she has still made progress towards all goals. She still has deficits in BLE strength (especially of proximal musculature) and balance, but this has improved since starting therapy. Pt reported that prior to her fall, she felt like she had really made improvements; but she fell onto her L side so her L hip pain is a little more aggravated right now. Pt would benefit from continued skilled PT intervention to maximize overall strength, balance, and endurance in order to improve overall function at home and in the community.   Rehab Potential Fair   Clinical Impairments Affecting Rehab Potential (-) Pt reporting history of poor compliant with HEP in the past, (+) reportedly highly motivated to improve her pain and mobility   PT Frequency 2x / week   PT Duration 6 weeks   PT Treatment/Interventions ADLs/Self Care Home Management;Cryotherapy;Gait training;Stair training;Functional mobility training;Therapeutic activities;Therapeutic exercise;Balance training;Neuromuscular re-education;Manual techniques;Patient/family education;Passive range of motion;Dry needling   PT Next Visit Plan Continue manual to VLO, ITB, TFL, and glutes; manual hip distraction if presents with hip pain, progress hip strengthening, CKC  strengthening, as well as dynamic stability of hips   PT Home Exercise Plan Rolling Lt quad; supine bridge; 5/23: sidelying clams with RTB, add RTB to bridges   Consulted and Agree with Plan of Care Patient      Patient will benefit from skilled therapeutic intervention in order to improve the following deficits and impairments:  Abnormal gait, Decreased balance, Decreased activity tolerance, Decreased strength, Impaired flexibility, Increased muscle spasms, Pain, Improper body mechanics  Visit Diagnosis: Pain in left hip  Muscle weakness (generalized)  Unsteadiness on feet  Other abnormalities of gait and mobility       G-Codes - 30-Aug-2016 1029    Functional Assessment Tool Used (Outpatient Only) clinical judgement, MMT, SLS   Functional Limitation Mobility: Walking and moving around   Mobility: Walking and Moving Around Current Status [phone]) At least 40 percent but less than 60 percent impaired, limited or restricted   Mobility: Walking and Moving Around Goal Status [phone]) At least 20 percent but less than 40 percent impaired, limited or restricted      Problem List Patient Active Problem List   Diagnosis Date Noted  . CAD S/P percutaneous coronary angioplasty 07/22/2015  . DOE (dyspnea on exertion)   . Unstable angina ([place]) 07/12/2015  . Heart palpitations 02/14/2015  . Hip bursitis 03/13/2014  . Trochanteric bursitis of left hip 03/12/2014  . Exertional chest pain 02/10/2014  . Preoperative cardiovascular examination 02/10/2014  . Angina pectoris, variant ([place]) 12/24/2012  . Left hip pain 12/16/2012  . Bursitis of left hip 12/16/2012  . Osteoarthritis, hip, bilateral 12/16/2012  . Chest pain 08/15/2012  . Apical variant hypertrophic  cardiomyopathy ([place]) 08/15/2012  . Bilateral inguinal hernia, right symptomatic 09/21/2011  . COMEDO 10/18/2008  . POSTMENOPAUSAL OSTEOPOROSIS 09/01/2008  . HIP PAIN, LEFT 01/19/2008  . CARCINOMA, BASAL CELL, SKIN 10/02/2006  .  VARICOSE VEIN 09/24/2006  . ACTINIC KERATOSIS 09/24/2006  . NECK PAIN 09/24/2006  . BACK PAIN 09/24/2006  . NEOP, BNG, SCALP/SKIN, NECK 04/16/2006  . FIBROIDS, UTERUS 04/16/2006  . Mixed hyperlipidemia 04/16/2006  . MACULAR DEGENERATION 04/16/2006  . Essential hypertension 04/16/2006  . ALLERGIC RHINITIS 04/16/2006  . OVERACTIVE BLADDER 04/16/2006  . URINARY INCONTINENCE 04/16/2006  . SKIN CANCER, HX OF 04/16/2006  . VERTIGO, HX OF 04/16/2006  . RENAL CALCULUS, HX OF 04/16/2006     Geraldine Solar PT, DPT   [facility] [address] [place], [place], [address] Phone: [phone]   Fax:  [phone]  Name: Lauren House MRN: [id number] Date of Birth: Jul 05, 1935

## 2016-08-16 ENCOUNTER — Encounter (HOSPITAL_COMMUNITY): Payer: Self-pay

## 2016-08-16 ENCOUNTER — Ambulatory Visit (HOSPITAL_COMMUNITY): Payer: Medicare Other

## 2016-08-16 DIAGNOSIS — R2681 Unsteadiness on feet: Secondary | ICD-10-CM | POA: Diagnosis not present

## 2016-08-16 DIAGNOSIS — M25561 Pain in right knee: Secondary | ICD-10-CM | POA: Diagnosis not present

## 2016-08-16 DIAGNOSIS — M6281 Muscle weakness (generalized): Secondary | ICD-10-CM | POA: Diagnosis not present

## 2016-08-16 DIAGNOSIS — M25552 Pain in left hip: Secondary | ICD-10-CM

## 2016-08-16 DIAGNOSIS — M7062 Trochanteric bursitis, left hip: Secondary | ICD-10-CM | POA: Diagnosis not present

## 2016-08-16 DIAGNOSIS — R2689 Other abnormalities of gait and mobility: Secondary | ICD-10-CM

## 2016-08-16 NOTE — Therapy (Signed)
[place], [place], [address] Phone: [phone]   Fax:  [phone]  Physical Therapy Treatment  Patient Details  Name: Lauren House MRN: [id number] Date of Birth: Feb 21, 1936 Referring Provider: Gerrit Halls, PA-C  Encounter Date: 08/16/2016      PT End of Session - 08/16/16 0954    Visit Number 6   Number of Visits 12   Date for PT Re-Evaluation 09/11/16   Authorization Type BCBS Medicare    Authorization Time Period 08/14/16 to 09/11/16   PT Start Time 0951   PT Stop Time 1026   PT Time Calculation (min) 35 min   Activity Tolerance Patient tolerated treatment well;Patient limited by fatigue;No increased pain   Behavior During Therapy WFL for tasks assessed/performed      Past Medical History:  Diagnosis Date  . Anxiety   . Apical variant hypertrophic cardiomyopathy ([place])   . Arthritis   . Borderline diabetes mellitus   . Bruises easily   . Bursitis of left hip   . Coronary atherosclerosis of native coronary artery    a. 2011: Cath showing nonobstructive CAD - 50-60% LAD (normal FFR) b. 06/2015: Cath showing 80% stenosis in the Mid LAD, tx w/ Promus DES 2.25 mm x 16 mm   . Essential hypertension, benign   . History of kidney stones   . History of skin cancer    Basal cell - abdomen  . Hyperlipidemia    Diet controlled.cannot take meds  . Nocturia   . Osteoporosis   . Overactive bladder   . Shortness of breath dyspnea   . Vertigo     Past Surgical History:  Procedure Laterality Date  . ABDOMINAL HYSTERECTOMY    . CARDIAC CATHETERIZATION N/A 07/22/2015   Procedure: Left Heart Cath and Coronary Angiography;  Surgeon: Leonie Man, MD;  Location: [facility];  Service: Cardiovascular;  Laterality: N/A;  . CARDIAC CATHETERIZATION N/A 07/22/2015   Procedure: Coronary Stent Intervention;  Surgeon: Leonie Man, MD;  Location: [facility];  Service: Cardiovascular;  Laterality:  N/A;  . CORONARY STENT PLACEMENT  07/22/2015   mid lad  des   . EYE SURGERY     cataracts bilateral  . HEMORRHOID SURGERY    . INGUINAL HERNIA REPAIR  10/11/2011   Procedure: HERNIA REPAIR INGUINAL ADULT;  Surgeon: Stark Klein, MD;  Location: [facility];  Service: General;  Laterality: Right;  . OPEN SURGICAL REPAIR OF GLUTEAL TENDON Left 03/12/2014   Procedure: LEFT HIP BURSECTOMY AND GLUTEAL TENDON REPAIR;  Surgeon: [name], MD;  Location: [facility];  Service: Orthopedics;  Laterality: Left;    There were no vitals filed for this visit.      Subjective Assessment - 08/16/16 0952    Subjective Pt states that her R knee is sore, which is the one she twisted in the fall. her L hip is okay today, it comes and goes.    Pertinent History Lt gluteal repair/bursectomy 2016; inguinal hernia repair 2013; anxiety, apical variant hypertrophic cadiomyopathy, osteoporosis   Patient Stated Goals improve knee/hip pain and improve balance    Currently in Pain? Yes   Pain Score 5    Pain Location Knee   Pain Orientation Right   Pain Descriptors / Indicators Aching   Pain Type Chronic pain   Pain Onset More than a month ago   Pain Frequency Intermittent   Aggravating Factors  prolonged sitting   Pain  Relieving Factors Tylenol   Effect of Pain on Daily Activities tries not to let it effect too much, does slow down                 [facility] Adult PT Treatment/Exercise - 08/16/16 0001      Knee/Hip Exercises: Standing   Heel Raises Both;15 reps   Heel Raises Limitations heel and toe   Forward Step Up Both;10 reps;Hand Hold: 0;Step Height: 4"   Other Standing Knee Exercises side stepping 75f x 2RT with RTB; standing HS curls with 2# weight (LLE only, increased R knee pain) 2 sets x 10 reps     Knee/Hip Exercises: Seated   Sit to Sand 10 reps;without UE support  2 sets, RTB around [employer]    Knee/Hip Exercises: Supine   Bridges Both;2 sets;10 reps   Bridges Limitations with RTB, holding  for 2-3 sec at the top     Manual Therapy   Manual Therapy Soft tissue mobilization   Manual therapy comments completed separate rest of treatment   Soft tissue mobilization IASTM with the stick to L VLO, ITB, TFL, and glutes                PT Education - 08/16/16 1028    Education provided Yes   Education Details exercise technique, proper breathing technique, ice R knee when she gets home to decrease the pain   Person(s) Educated Patient   Methods Explanation;Demonstration   Comprehension Verbalized understanding;Returned demonstration          PT Short Term Goals - 08/14/16 0956      PT SHORT TERM GOAL #1   Title Pt will demo consistency and independence with her HEP to improve her BLE strength and mobility   Baseline 6/19: was performing HEP consistently prior to fall and bout with pneumonia   Time 2   Period Weeks   Status Partially Met     PT SHORT TERM GOAL #2   Title Pt will demo proper mechanics with stair negotiation, evident by her ability to ascend and descend atleast 12, 6" steps with reciprocal pattern and no more than 1 handrail for safety.    Baseline 6/19: pt had 2-3 minor staggers when ascending/descending stairs   Time 2   Period Weeks   Status Partially Met     PT SHORT TERM GOAL #3   Title Pt will maintain SLS on each LE for atleast 15 sec, 2/3 trials, to improve her safety with functional activity at home.   Baseline 6/19: R: 11, 12, 3 seconds; L: 12.5, 12, 7 seconds   Status On-going           PT Long Term Goals - 08/14/16 0957      PT LONG TERM GOAL #1   Title Pt will demo improved Lt hip abductor strength and endurance evident by her ability to maintain SLS on the Lt for atleast 10sec without a (+) trendelenburg sign, 2/3 trials.     Baseline 6/19: able to perform SLS for 12.5 and 12 seconds, but had mild trendelenberg during 2nd trial   Time 6   Period Weeks   Status Partially Met     PT LONG TERM GOAL #2   Title Pt will demo  improved BLE strength to atleast 4/5 MMT which will improve her safety and mobility throughout the day.    Time 6   Period Weeks   Status On-going     PT LONG TERM GOAL #  3   Title Pt will report atleast 75% improvement in her Lt hip pain with return to daily activity, to increase her independence and quality of life.    Baseline 6/19: it had until she fell on her L side   Time 6   Period Weeks   Status On-going               Plan - 08/16/16 1028    Clinical Impression Statement Began session with IASTM with the stick to pt's L VLO, ITB, TFL, and glutes to address continued soft tissue restrictions. She reported feeling much better following. Continued with BLE strengthening and CKC functional strengthening and pt did well with it but did report feeling SOB after a few activities. Checked pt's HR and O2. First reading was 60% O2 and 118 HR; had pt perform deep breathing for a minute but her O2 did not change. Switched pulse ox to 2 different fingers and both read 99% and 68HR; checked HR manually and it was approximatetly 65bpm. Initial reading likely equipment malfunction. However, pt still felt SOB and wished to end therapy a few minutes early. Continue manual and CKC strengthening and progressing to pt's toelrance.   Rehab Potential Fair   Clinical Impairments Affecting Rehab Potential (-) Pt reporting history of poor compliant with HEP in the past, (+) reportedly highly motivated to improve her pain and mobility   PT Frequency 2x / week   PT Duration 6 weeks   PT Treatment/Interventions ADLs/Self Care Home Management;Cryotherapy;Gait training;Stair training;Functional mobility training;Therapeutic activities;Therapeutic exercise;Balance training;Neuromuscular re-education;Manual techniques;Patient/family education;Passive range of motion;Dry needling   PT Next Visit Plan Continue manual to VLO, ITB, TFL, and glutes; manual hip distraction if presents with hip pain, progress hip  strengthening, CKC strengthening, as well as dynamic stability of hips; SLS with UE movements, etc.    PT Home Exercise Plan Rolling Lt quad; supine bridge; 5/23: sidelying clams with RTB, add RTB to bridges   Consulted and Agree with Plan of Care Patient      Patient will benefit from skilled therapeutic intervention in order to improve the following deficits and impairments:  Abnormal gait, Decreased balance, Decreased activity tolerance, Decreased strength, Impaired flexibility, Increased muscle spasms, Pain, Improper body mechanics  Visit Diagnosis: Pain in left hip  Muscle weakness (generalized)  Unsteadiness on feet  Other abnormalities of gait and mobility     Problem List Patient Active Problem List   Diagnosis Date Noted  . CAD S/P percutaneous coronary angioplasty 07/22/2015  . DOE (dyspnea on exertion)   . Unstable angina ([place]) 07/12/2015  . Heart palpitations 02/14/2015  . Hip bursitis 03/13/2014  . Trochanteric bursitis of left hip 03/12/2014  . Exertional chest pain 02/10/2014  . Preoperative cardiovascular examination 02/10/2014  . Angina pectoris, variant ([place]) 12/24/2012  . Left hip pain 12/16/2012  . Bursitis of left hip 12/16/2012  . Osteoarthritis, hip, bilateral 12/16/2012  . Chest pain 08/15/2012  . Apical variant hypertrophic cardiomyopathy ([place]) 08/15/2012  . Bilateral inguinal hernia, right symptomatic 09/21/2011  . COMEDO 10/18/2008  . POSTMENOPAUSAL OSTEOPOROSIS 09/01/2008  . HIP PAIN, LEFT 01/19/2008  . CARCINOMA, BASAL CELL, SKIN 10/02/2006  . VARICOSE VEIN 09/24/2006  . ACTINIC KERATOSIS 09/24/2006  . NECK PAIN 09/24/2006  . BACK PAIN 09/24/2006  . NEOP, BNG, SCALP/SKIN, NECK 04/16/2006  . FIBROIDS, UTERUS 04/16/2006  . Mixed hyperlipidemia 04/16/2006  . MACULAR DEGENERATION 04/16/2006  . Essential hypertension 04/16/2006  . ALLERGIC RHINITIS 04/16/2006  . OVERACTIVE BLADDER 04/16/2006  .  URINARY INCONTINENCE 04/16/2006  . SKIN  CANCER, HX OF 04/16/2006  . VERTIGO, HX OF 04/16/2006  . RENAL CALCULUS, HX OF 04/16/2006     Geraldine Solar PT, DPT   [facility] [address] [place], [place], [address] Phone: [phone]   Fax:  [phone]  Name: Lauren House MRN: [id number] Date of Birth: 1935/06/22

## 2016-08-21 ENCOUNTER — Ambulatory Visit (HOSPITAL_COMMUNITY): Payer: Medicare Other

## 2016-08-21 ENCOUNTER — Encounter (HOSPITAL_COMMUNITY): Payer: Self-pay

## 2016-08-21 DIAGNOSIS — M25561 Pain in right knee: Secondary | ICD-10-CM

## 2016-08-21 DIAGNOSIS — R2681 Unsteadiness on feet: Secondary | ICD-10-CM

## 2016-08-21 DIAGNOSIS — M7062 Trochanteric bursitis, left hip: Secondary | ICD-10-CM

## 2016-08-21 DIAGNOSIS — M25552 Pain in left hip: Secondary | ICD-10-CM | POA: Diagnosis not present

## 2016-08-21 DIAGNOSIS — R2689 Other abnormalities of gait and mobility: Secondary | ICD-10-CM

## 2016-08-21 DIAGNOSIS — M6281 Muscle weakness (generalized): Secondary | ICD-10-CM

## 2016-08-21 NOTE — Therapy (Signed)
[place], [place], [address] Phone: [phone]   Fax:  [phone]  Physical Therapy Treatment  Patient Details  Name: Lauren House MRN: [id number] Date of Birth: December 28, 1935 Referring Provider: Gerrit Halls, PA-C  Encounter Date: 08/21/2016      PT End of Session - 08/21/16 0942    Visit Number 7   Number of Visits 12   Date for PT Re-Evaluation 09/11/16   Authorization Type BCBS Medicare    Authorization Time Period 08/14/16 to 09/11/16   PT Start Time 0942   PT Stop Time 1023   PT Time Calculation (min) 41 min   Activity Tolerance Patient tolerated treatment well;Patient limited by fatigue;No increased pain   Behavior During Therapy WFL for tasks assessed/performed      Past Medical History:  Diagnosis Date  . Anxiety   . Apical variant hypertrophic cardiomyopathy ([place])   . Arthritis   . Borderline diabetes mellitus   . Bruises easily   . Bursitis of left hip   . Coronary atherosclerosis of native coronary artery    a. 2011: Cath showing nonobstructive CAD - 50-60% LAD (normal FFR) b. 06/2015: Cath showing 80% stenosis in the Mid LAD, tx w/ Promus DES 2.25 mm x 16 mm   . Essential hypertension, benign   . History of kidney stones   . History of skin cancer    Basal cell - abdomen  . Hyperlipidemia    Diet controlled.cannot take meds  . Nocturia   . Osteoporosis   . Overactive bladder   . Shortness of breath dyspnea   . Vertigo     Past Surgical History:  Procedure Laterality Date  . ABDOMINAL HYSTERECTOMY    . CARDIAC CATHETERIZATION N/A 07/22/2015   Procedure: Left Heart Cath and Coronary Angiography;  Surgeon: Leonie Man, MD;  Location: [facility];  Service: Cardiovascular;  Laterality: N/A;  . CARDIAC CATHETERIZATION N/A 07/22/2015   Procedure: Coronary Stent Intervention;  Surgeon: Leonie Man, MD;  Location: [facility];  Service: Cardiovascular;  Laterality:  N/A;  . CORONARY STENT PLACEMENT  07/22/2015   mid lad  des   . EYE SURGERY     cataracts bilateral  . HEMORRHOID SURGERY    . INGUINAL HERNIA REPAIR  10/11/2011   Procedure: HERNIA REPAIR INGUINAL ADULT;  Surgeon: Stark Klein, MD;  Location: [facility];  Service: General;  Laterality: Right;  . OPEN SURGICAL REPAIR OF GLUTEAL TENDON Left 03/12/2014   Procedure: LEFT HIP BURSECTOMY AND GLUTEAL TENDON REPAIR;  Surgeon: [name], MD;  Location: [facility];  Service: Orthopedics;  Laterality: Left;    There were no vitals filed for this visit.      Subjective Assessment - 08/21/16 0942    Subjective Pt states that her R knee was killing her last night. It just hurt her really bad last night and she doesn't know why. It is not hurting her nearly as much as last night, but it is hurting her a little bit. Her L hip and knee are not really hurting right now.    Pertinent History Lt gluteal repair/bursectomy 2016; inguinal hernia repair 2013; anxiety, apical variant hypertrophic cadiomyopathy, osteoporosis   How long can you sit comfortably? unlimited    Diagnostic tests Xray: negative per pt    Patient Stated Goals improve knee/hip pain and improve balance    Currently in Pain? Yes   Pain Score 4  Pain Location Knee   Pain Orientation Right   Pain Descriptors / Indicators Throbbing   Pain Type Acute pain   Pain Onset Yesterday   Pain Frequency Intermittent   Aggravating Factors  unable to report   Pain Relieving Factors rest   Effect of Pain on Daily Activities tries not to let it effect too much, does slow down               [facility] Adult PT Treatment/Exercise - 08/21/16 0001      Knee/Hip Exercises: Standing   Functional Squat 10 reps   Functional Squat Limitations 2 sets, chair behind for form   SLS BLE, 5 reps x 10 sec each with intermittent UE support   SLS with Vectors x5 BLE with 2-3 sec holds each   Other Standing Knee Exercises SLS with OH press for improved glute  strengthening 2 sets x10 reps each with 2# DB; bil tandem stance 5 reps x 10 sec each on foam   Other Standing Knee Exercises sidestepping with RTB [id number]     Manual Therapy   Manual Therapy Soft tissue mobilization   Manual therapy comments completed separate rest of treatment   Soft tissue mobilization IASTM with the stick to L VLO, ITB, TFL, and glutes               PT Short Term Goals - 08/14/16 0956      PT SHORT TERM GOAL #1   Title Pt will demo consistency and independence with her HEP to improve her BLE strength and mobility   Baseline 6/19: was performing HEP consistently prior to fall and bout with pneumonia   Time 2   Period Weeks   Status Partially Met     PT SHORT TERM GOAL #2   Title Pt will demo proper mechanics with stair negotiation, evident by her ability to ascend and descend atleast 12, 6" steps with reciprocal pattern and no more than 1 handrail for safety.    Baseline 6/19: pt had 2-3 minor staggers when ascending/descending stairs   Time 2   Period Weeks   Status Partially Met     PT SHORT TERM GOAL #3   Title Pt will maintain SLS on each LE for atleast 15 sec, 2/3 trials, to improve her safety with functional activity at home.   Baseline 6/19: R: 11, 12, 3 seconds; L: 12.5, 12, 7 seconds   Status On-going           PT Long Term Goals - 08/14/16 0957      PT LONG TERM GOAL #1   Title Pt will demo improved Lt hip abductor strength and endurance evident by her ability to maintain SLS on the Lt for atleast 10sec without a (+) trendelenburg sign, 2/3 trials.     Baseline 6/19: able to perform SLS for 12.5 and 12 seconds, but had mild trendelenberg during 2nd trial   Time 6   Period Weeks   Status Partially Met     PT LONG TERM GOAL #2   Title Pt will demo improved BLE strength to atleast 4/5 MMT which will improve her safety and mobility throughout the day.    Time 6   Period Weeks   Status On-going     PT LONG TERM GOAL #3   Title Pt  will report atleast 75% improvement in her Lt hip pain with return to daily activity, to increase her independence and quality of life.    Baseline 6/19:  it had until she fell on her L side   Time 6   Period Weeks   Status On-going               Plan - 08/21/16 1025    Clinical Impression Statement Began session again with IASTM with the stick to L VLO, ITB, TFL, and glutes; pt verbalizes that she feels like this helps her a lot and it really helps her pain. Introduced pt to dynamic hip stability exercises this date. She did well but did require frequent rest breaks. She was unsteady throughout but no significant LOBs occurred. Continue manual, functional strengthening, and dynamic hip stability.    Rehab Potential Fair   Clinical Impairments Affecting Rehab Potential (-) Pt reporting history of poor compliant with HEP in the past, (+) reportedly highly motivated to improve her pain and mobility   PT Frequency 2x / week   PT Duration 6 weeks   PT Treatment/Interventions ADLs/Self Care Home Management;Cryotherapy;Gait training;Stair training;Functional mobility training;Therapeutic activities;Therapeutic exercise;Balance training;Neuromuscular re-education;Manual techniques;Patient/family education;Passive range of motion;Dry needling   PT Next Visit Plan Continue manual to VLO, ITB, TFL, and glutes; manual hip distraction if presents with hip pain, progress hip strengthening, CKC strengthening, as well as dynamic stability of hips; SLS with UE movements, etc. trial tandem gait on firm or foam, goblet squats   PT Home Exercise Plan Rolling Lt quad; supine bridge; 5/23: sidelying clams with RTB, add RTB to bridges   Consulted and Agree with Plan of Care Patient      Patient will benefit from skilled therapeutic intervention in order to improve the following deficits and impairments:  Abnormal gait, Decreased balance, Decreased activity tolerance, Decreased strength, Impaired flexibility,  Increased muscle spasms, Pain, Improper body mechanics  Visit Diagnosis: Pain in left hip  Muscle weakness (generalized)  Unsteadiness on feet  Other abnormalities of gait and mobility  Right knee pain, unspecified chronicity  Trochanteric bursitis of left hip     Problem List Patient Active Problem List   Diagnosis Date Noted  . CAD S/P percutaneous coronary angioplasty 07/22/2015  . DOE (dyspnea on exertion)   . Unstable angina ([place]) 07/12/2015  . Heart palpitations 02/14/2015  . Hip bursitis 03/13/2014  . Trochanteric bursitis of left hip 03/12/2014  . Exertional chest pain 02/10/2014  . Preoperative cardiovascular examination 02/10/2014  . Angina pectoris, variant ([place]) 12/24/2012  . Left hip pain 12/16/2012  . Bursitis of left hip 12/16/2012  . Osteoarthritis, hip, bilateral 12/16/2012  . Chest pain 08/15/2012  . Apical variant hypertrophic cardiomyopathy ([place]) 08/15/2012  . Bilateral inguinal hernia, right symptomatic 09/21/2011  . COMEDO 10/18/2008  . POSTMENOPAUSAL OSTEOPOROSIS 09/01/2008  . HIP PAIN, LEFT 01/19/2008  . CARCINOMA, BASAL CELL, SKIN 10/02/2006  . VARICOSE VEIN 09/24/2006  . ACTINIC KERATOSIS 09/24/2006  . NECK PAIN 09/24/2006  . BACK PAIN 09/24/2006  . NEOP, BNG, SCALP/SKIN, NECK 04/16/2006  . FIBROIDS, UTERUS 04/16/2006  . Mixed hyperlipidemia 04/16/2006  . MACULAR DEGENERATION 04/16/2006  . Essential hypertension 04/16/2006  . ALLERGIC RHINITIS 04/16/2006  . OVERACTIVE BLADDER 04/16/2006  . URINARY INCONTINENCE 04/16/2006  . SKIN CANCER, HX OF 04/16/2006  . VERTIGO, HX OF 04/16/2006  . RENAL CALCULUS, HX OF 04/16/2006     Geraldine Solar PT, DPT   [facility] [address] [place], [place], [address] Phone: [phone]   Fax:  [phone]  Name: CHEQUITA MOFIELD MRN: [id number] Date of Birth: 06-15-1935

## 2016-08-23 ENCOUNTER — Ambulatory Visit (HOSPITAL_COMMUNITY): Payer: Medicare Other

## 2016-08-23 ENCOUNTER — Encounter (HOSPITAL_COMMUNITY): Payer: Self-pay

## 2016-08-23 DIAGNOSIS — R2689 Other abnormalities of gait and mobility: Secondary | ICD-10-CM | POA: Diagnosis not present

## 2016-08-23 DIAGNOSIS — M25552 Pain in left hip: Secondary | ICD-10-CM

## 2016-08-23 DIAGNOSIS — M6281 Muscle weakness (generalized): Secondary | ICD-10-CM

## 2016-08-23 DIAGNOSIS — M7062 Trochanteric bursitis, left hip: Secondary | ICD-10-CM | POA: Diagnosis not present

## 2016-08-23 DIAGNOSIS — M25561 Pain in right knee: Secondary | ICD-10-CM

## 2016-08-23 DIAGNOSIS — R2681 Unsteadiness on feet: Secondary | ICD-10-CM

## 2016-08-23 NOTE — Therapy (Signed)
[facility] [place], [place], [address] Phone: [phone]   Fax:  [phone]  Physical Therapy Treatment  Patient Details  Name: Lauren House MRN: [id number] Date of Birth: 1936/01/13 Referring Provider: Gerrit Halls, PA-C  Encounter Date: 08/23/2016      PT End of Session - 08/23/16 1028    Visit Number 8   Number of Visits 12   Date for PT Re-Evaluation 09/11/16   Authorization Type BCBS Medicare    Authorization Time Period 08/14/16 to 09/11/16   PT Start Time 0950   PT Stop Time 1026   PT Time Calculation (min) 36 min   Activity Tolerance Patient tolerated treatment well;Patient limited by fatigue;No increased pain   Behavior During Therapy WFL for tasks assessed/performed      Past Medical History:  Diagnosis Date  . Anxiety   . Apical variant hypertrophic cardiomyopathy ([place])   . Arthritis   . Borderline diabetes mellitus   . Bruises easily   . Bursitis of left hip   . Coronary atherosclerosis of native coronary artery    a. 2011: Cath showing nonobstructive CAD - 50-60% LAD (normal FFR) b. 06/2015: Cath showing 80% stenosis in the Mid LAD, tx w/ Promus DES 2.25 mm x 16 mm   . Essential hypertension, benign   . History of kidney stones   . History of skin cancer    Basal cell - abdomen  . Hyperlipidemia    Diet controlled.cannot take meds  . Nocturia   . Osteoporosis   . Overactive bladder   . Shortness of breath dyspnea   . Vertigo     Past Surgical History:  Procedure Laterality Date  . ABDOMINAL HYSTERECTOMY    . CARDIAC CATHETERIZATION N/A 07/22/2015   Procedure: Left Heart Cath and Coronary Angiography;  Surgeon: Leonie Man, MD;  Location: [facility];  Service: Cardiovascular;  Laterality: N/A;  . CARDIAC CATHETERIZATION N/A 07/22/2015   Procedure: Coronary Stent Intervention;  Surgeon: Leonie Man, MD;  Location: [facility];  Service: Cardiovascular;  Laterality:  N/A;  . CORONARY STENT PLACEMENT  07/22/2015   mid lad  des   . EYE SURGERY     cataracts bilateral  . HEMORRHOID SURGERY    . INGUINAL HERNIA REPAIR  10/11/2011   Procedure: HERNIA REPAIR INGUINAL ADULT;  Surgeon: Stark Klein, MD;  Location: [facility];  Service: General;  Laterality: Right;  . OPEN SURGICAL REPAIR OF GLUTEAL TENDON Left 03/12/2014   Procedure: LEFT HIP BURSECTOMY AND GLUTEAL TENDON REPAIR;  Surgeon: [name], MD;  Location: [facility];  Service: Orthopedics;  Laterality: Left;    There were no vitals filed for this visit.      Subjective Assessment - 08/23/16 0953    Subjective Pt states that she was a little bit sore following last session but she feels better now. She states that she thinks she is going to have to go back to her doctor regarding her R knee.    Pertinent History Lt gluteal repair/bursectomy 2016; inguinal hernia repair 2013; anxiety, apical variant hypertrophic cadiomyopathy, osteoporosis   How long can you sit comfortably? unlimited    Diagnostic tests Xray: negative per pt    Patient Stated Goals improve knee/hip pain and improve balance    Currently in Pain? Yes   Pain Score 3    Pain Location Knee   Pain Orientation Right   Pain Descriptors / Indicators Aching  Pain Type Chronic pain   Pain Onset More than a month ago   Pain Frequency Intermittent   Aggravating Factors  unable to report   Pain Relieving Factors rest   Effect of Pain on Daily Activities tries to not let it effect too much, does slow down              [facility] Adult PT Treatment/Exercise - 08/23/16 0001      Knee/Hip Exercises: Stretches   Piriformis Stretch 3 reps;30 seconds   Piriformis Stretch Limitations BLE, supine     Knee/Hip Exercises: Standing   Heel Raises Both;15 reps   Heel Raises Limitations heel and toe   Knee Flexion Strengthening;Both;10 reps   Knee Flexion Limitations 2#   Hip Flexion Both;1 set;10 reps   Hip Flexion Limitations marching on foam  with 2 sec hip flexion hold   Hip Abduction Both;10 reps   Abduction Limitations RTB   Functional Squat 10 reps   Functional Squat Limitations goblet squats with 5#, chair behind for form   Other Standing Knee Exercises bil staggered stance with front foot elevated on 4" ste   Other Standing Knee Exercises fwd tandem gait 35f x 2RT     Manual Therapy   Manual Therapy Soft tissue mobilization   Manual therapy comments completed separate rest of treatment   Soft tissue mobilization IASTM with the stick to L VLO, ITB, TFL, and glutes               PT Education - 08/23/16 1028    Education provided Yes   Education Details exercise technique   Person(s) Educated Patient   Methods Demonstration;Explanation   Comprehension Returned demonstration;Verbalized understanding          PT Short Term Goals - 08/14/16 0956      PT SHORT TERM GOAL #1   Title Pt will demo consistency and independence with her HEP to improve her BLE strength and mobility   Baseline 6/19: was performing HEP consistently prior to fall and bout with pneumonia   Time 2   Period Weeks   Status Partially Met     PT SHORT TERM GOAL #2   Title Pt will demo proper mechanics with stair negotiation, evident by her ability to ascend and descend atleast 12, 6" steps with reciprocal pattern and no more than 1 handrail for safety.    Baseline 6/19: pt had 2-3 minor staggers when ascending/descending stairs   Time 2   Period Weeks   Status Partially Met     PT SHORT TERM GOAL #3   Title Pt will maintain SLS on each LE for atleast 15 sec, 2/3 trials, to improve her safety with functional activity at home.   Baseline 6/19: R: 11, 12, 3 seconds; L: 12.5, 12, 7 seconds   Status On-going           PT Long Term Goals - 08/14/16 0957      PT LONG TERM GOAL #1   Title Pt will demo improved Lt hip abductor strength and endurance evident by her ability to maintain SLS on the Lt for atleast 10sec without a (+)  trendelenburg sign, 2/3 trials.     Baseline 6/19: able to perform SLS for 12.5 and 12 seconds, but had mild trendelenberg during 2nd trial   Time 6   Period Weeks   Status Partially Met     PT LONG TERM GOAL #2   Title Pt will demo improved BLE strength to  atleast 4/5 MMT which will improve her safety and mobility throughout the day.    Time 6   Period Weeks   Status On-going     PT LONG TERM GOAL #3   Title Pt will report atleast 75% improvement in her Lt hip pain with return to daily activity, to increase her independence and quality of life.    Baseline 6/19: it had until she fell on her L side   Time 6   Period Weeks   Status On-going               Plan - 08/23/16 1029    Clinical Impression Statement Continued with IASTM to LLE, pt again verbalized feeling better following. Glute and functional strengthening as well as dynamic stability of hips were the focus of this session. pt SOB towards end of session so ended a few minutes early. Pt continues to have deficits in dynamic stability and this will continue to be addressed in future sessions.   Rehab Potential Fair   Clinical Impairments Affecting Rehab Potential (-) Pt reporting history of poor compliant with HEP in the past, (+) reportedly highly motivated to improve her pain and mobility   PT Frequency 2x / week   PT Duration 6 weeks   PT Treatment/Interventions ADLs/Self Care Home Management;Cryotherapy;Gait training;Stair training;Functional mobility training;Therapeutic activities;Therapeutic exercise;Balance training;Neuromuscular re-education;Manual techniques;Patient/family education;Passive range of motion;Dry needling   PT Next Visit Plan Continue manual to VLO, ITB, TFL, and glutes; manual hip distraction if presents with hip pain, progress hip strengthening, CKC strengthening, as well as dynamic stability of hips; SLS with UE movements,continue fwd tandem gait on firm (trial fwd on foam and retro on firm),  continue goblet squats, add in lunging   PT Home Exercise Plan Rolling Lt quad; supine bridge; 5/23: sidelying clams with RTB, add RTB to bridges   Consulted and Agree with Plan of Care Patient      Patient will benefit from skilled therapeutic intervention in order to improve the following deficits and impairments:  Abnormal gait, Decreased balance, Decreased activity tolerance, Decreased strength, Impaired flexibility, Increased muscle spasms, Pain, Improper body mechanics  Visit Diagnosis: Pain in left hip  Muscle weakness (generalized)  Unsteadiness on feet  Other abnormalities of gait and mobility  Right knee pain, unspecified chronicity     Problem List Patient Active Problem List   Diagnosis Date Noted  . CAD S/P percutaneous coronary angioplasty 07/22/2015  . DOE (dyspnea on exertion)   . Unstable angina ([place]) 07/12/2015  . Heart palpitations 02/14/2015  . Hip bursitis 03/13/2014  . Trochanteric bursitis of left hip 03/12/2014  . Exertional chest pain 02/10/2014  . Preoperative cardiovascular examination 02/10/2014  . Angina pectoris, variant ([place]) 12/24/2012  . Left hip pain 12/16/2012  . Bursitis of left hip 12/16/2012  . Osteoarthritis, hip, bilateral 12/16/2012  . Chest pain 08/15/2012  . Apical variant hypertrophic cardiomyopathy ([place]) 08/15/2012  . Bilateral inguinal hernia, right symptomatic 09/21/2011  . COMEDO 10/18/2008  . POSTMENOPAUSAL OSTEOPOROSIS 09/01/2008  . HIP PAIN, LEFT 01/19/2008  . CARCINOMA, BASAL CELL, SKIN 10/02/2006  . VARICOSE VEIN 09/24/2006  . ACTINIC KERATOSIS 09/24/2006  . NECK PAIN 09/24/2006  . BACK PAIN 09/24/2006  . NEOP, BNG, SCALP/SKIN, NECK 04/16/2006  . FIBROIDS, UTERUS 04/16/2006  . Mixed hyperlipidemia 04/16/2006  . MACULAR DEGENERATION 04/16/2006  . Essential hypertension 04/16/2006  . ALLERGIC RHINITIS 04/16/2006  . OVERACTIVE BLADDER 04/16/2006  . URINARY INCONTINENCE 04/16/2006  . SKIN CANCER, HX OF 04/16/2006   .  VERTIGO, HX OF 04/16/2006  . RENAL CALCULUS, HX OF 04/16/2006     Geraldine Solar PT, DPT   [facility] [address] [place], [place], [address] Phone: [phone]   Fax:  [phone]  Name: Lauren House MRN: [id number] Date of Birth: 07/25/1935

## 2016-08-28 ENCOUNTER — Ambulatory Visit (HOSPITAL_COMMUNITY): Payer: Medicare Other | Attending: Physician Assistant

## 2016-08-28 ENCOUNTER — Encounter (HOSPITAL_COMMUNITY): Payer: Self-pay

## 2016-08-28 DIAGNOSIS — R2681 Unsteadiness on feet: Secondary | ICD-10-CM

## 2016-08-28 DIAGNOSIS — M25552 Pain in left hip: Secondary | ICD-10-CM | POA: Diagnosis not present

## 2016-08-28 DIAGNOSIS — R2689 Other abnormalities of gait and mobility: Secondary | ICD-10-CM

## 2016-08-28 DIAGNOSIS — M6281 Muscle weakness (generalized): Secondary | ICD-10-CM

## 2016-08-28 NOTE — Therapy (Signed)
[facility] [address] [place], [place], [address] Phone: [phone]   Fax:  [phone]  Physical Therapy Treatment  Patient Details  Name: Lauren House MRN: [id number] Date of Birth: 1935-12-30 Referring Provider: Jodene Nam, PA-C  Encounter Date: 08/28/2016      PT End of Session - 08/28/16 0952    Visit Number 9   Number of Visits 12   Date for PT Re-Evaluation 09/11/16   Authorization Type BCBS Medicare    Authorization Time Period 08/14/16 to 09/11/16   PT Start Time 0950   PT Stop Time 1022   PT Time Calculation (min) 32 min   Activity Tolerance Patient tolerated treatment well;Patient limited by fatigue;No increased pain   Behavior During Therapy WFL for tasks assessed/performed      Past Medical History:  Diagnosis Date  . Anxiety   . Apical variant hypertrophic cardiomyopathy ([facility])   . Arthritis   . Borderline diabetes mellitus   . Bruises easily   . Bursitis of left hip   . Coronary atherosclerosis of native coronary artery    a. 2011: Cath showing nonobstructive CAD - 50-60% LAD (normal FFR) b. 06/2015: Cath showing 80% stenosis in the Mid LAD, tx w/ Promus DES 2.25 mm x 16 mm   . Essential hypertension, benign   . History of kidney stones   . History of skin cancer    Basal cell - abdomen  . Hyperlipidemia    Diet controlled.cannot take meds  . Nocturia   . Osteoporosis   . Overactive bladder   . Shortness of breath dyspnea   . Vertigo     Past Surgical History:  Procedure Laterality Date  . ABDOMINAL HYSTERECTOMY    . CARDIAC CATHETERIZATION N/A 07/22/2015   Procedure: Left Heart Cath and Coronary Angiography;  Surgeon: Marykay Lex, MD;  Location: [facility];  Service: Cardiovascular;  Laterality: N/A;  . CARDIAC CATHETERIZATION N/A 07/22/2015   Procedure: Coronary Stent Intervention;  Surgeon: Marykay Lex, MD;  Location: [facility];  Service: Cardiovascular;  Laterality:  N/A;  . CORONARY STENT PLACEMENT  07/22/2015   mid lad  des   . EYE SURGERY     cataracts bilateral  . HEMORRHOID SURGERY    . INGUINAL HERNIA REPAIR  10/11/2011   Procedure: HERNIA REPAIR INGUINAL ADULT;  Surgeon: Almond Lint, MD;  Location: [facility];  Service: General;  Laterality: Right;  . OPEN SURGICAL REPAIR OF GLUTEAL TENDON Left 03/12/2014   Procedure: LEFT HIP BURSECTOMY AND GLUTEAL TENDON REPAIR;  Surgeon: Loanne Drilling, MD;  [facility];  Service: Orthopedics;  Laterality: Left;    There were no vitals filed for this visit.      Subjective Assessment - 08/28/16 0952    Subjective Pt states that her R knee was hurting her bad all last night. It's not too bad right now. Her L hip is feeling good, it's not hurting too bad right now either.   Pertinent History Lt gluteal repair/bursectomy 2016; inguinal hernia repair 2013; anxiety, apical variant hypertrophic cadiomyopathy, osteoporosis   How long can you sit comfortably? unlimited    Diagnostic tests Xray: negative per pt    Patient Stated Goals improve knee/hip pain and improve balance    Currently in Pain? No/denies   Pain Onset More than a month ago                [facility] Adult PT Treatment/Exercise -  08/28/16 0001      Knee/Hip Exercises: Standing   Other Standing Knee Exercises fwd tandem gait on foam x2RT   Other Standing Knee Exercises bil tandem stance on foam and OH lifts with 2# weight bar x10 reps each; bil tandem stance on foam and trunk rotations with RTB x 10 reps each     Manual Therapy   Manual Therapy Soft tissue mobilization   Manual therapy comments completed separate rest of treatment   Soft tissue mobilization IASTM with the stick to L VLO, ITB, TFL, and glutes                PT Education - 08/28/16 1023    Education provided Yes   Education Details attain balance before performing dynamic movements; ice R knee    Person(s) Educated Patient   Methods Explanation;Demonstration    Comprehension Verbalized understanding;Returned demonstration          PT Short Term Goals - 08/14/16 0956      PT SHORT TERM GOAL #1   Title Pt will demo consistency and independence with her HEP to improve her BLE strength and mobility   Baseline 6/19: was performing HEP consistently prior to fall and bout with pneumonia   Time 2   Period Weeks   Status Partially Met     PT SHORT TERM GOAL #2   Title Pt will demo proper mechanics with stair negotiation, evident by her ability to ascend and descend atleast 12, 6" steps with reciprocal pattern and no more than 1 handrail for safety.    Baseline 6/19: pt had 2-3 minor staggers when ascending/descending stairs   Time 2   Period Weeks   Status Partially Met     PT SHORT TERM GOAL #3   Title Pt will maintain SLS on each LE for atleast 15 sec, 2/3 trials, to improve her safety with functional activity at home.   Baseline 6/19: R: 11, 12, 3 seconds; L: 12.5, 12, 7 seconds   Status On-going           PT Long Term Goals - 08/14/16 0957      PT LONG TERM GOAL #1   Title Pt will demo improved Lt hip abductor strength and endurance evident by her ability to maintain SLS on the Lt for atleast 10sec without a (+) trendelenburg sign, 2/3 trials.     Baseline 6/19: able to perform SLS for 12.5 and 12 seconds, but had mild trendelenberg during 2nd trial   Time 6   Period Weeks   Status Partially Met     PT LONG TERM GOAL #2   Title Pt will demo improved BLE strength to atleast 4/5 MMT which will improve her safety and mobility throughout the day.    Time 6   Period Weeks   Status On-going     PT LONG TERM GOAL #3   Title Pt will report atleast 75% improvement in her Lt hip pain with return to daily activity, to increase her independence and quality of life.    Baseline 6/19: it had until she fell on her L side   Time 6   Period Weeks   Status On-going               Plan - 08/28/16 1023    Clinical Impression Statement  Session limited again this date due to pt's baseline cardiopulm limitations; pt felt SOB and a little dizzy following fwd tandem gait on foam. Pt felt better  following brief sitting rest break but wished to end therapy early. Pt did well with dynamic stability exercises this date but was unsteady throughout, requiring supervision to min A to maintain balance. She required a seated rest break in between each exercise however due to fatigue.  Pt's L hip pain is progressing nicely. Continue POC as planned within pt's tolerance.   Rehab Potential Fair   Clinical Impairments Affecting Rehab Potential (-) Pt reporting history of poor compliant with HEP in the past, (+) reportedly highly motivated to improve her pain and mobility   PT Frequency 2x / week   PT Duration 6 weeks   PT Treatment/Interventions ADLs/Self Care Home Management;Cryotherapy;Gait training;Stair training;Functional mobility training;Therapeutic activities;Therapeutic exercise;Balance training;Neuromuscular re-education;Manual techniques;Patient/family education;Passive range of motion;Dry needling   PT Next Visit Plan Continue manual to VLO, ITB, TFL, and glutes; manual hip distraction if presents with hip pain, progress hip strengthening, CKC strengthening, as well as dynamic stability of hips; SLS with UE movements,continue fwd tandem gait on foam, trial retro on firm, continue goblet squats, add in lunging   PT Home Exercise Plan Rolling Lt quad; supine bridge; 5/23: sidelying clams with RTB, add RTB to bridges   Consulted and Agree with Plan of Care Patient      Patient will benefit from skilled therapeutic intervention in order to improve the following deficits and impairments:  Abnormal gait, Decreased balance, Decreased activity tolerance, Decreased strength, Impaired flexibility, Increased muscle spasms, Pain, Improper body mechanics  Visit Diagnosis: Pain in left hip  Muscle weakness (generalized)  Unsteadiness on  feet  Other abnormalities of gait and mobility     Problem List Patient Active Problem List   Diagnosis Date Noted  . CAD S/P percutaneous coronary angioplasty 07/22/2015  . DOE (dyspnea on exertion)   . Unstable angina ([place]) 07/12/2015  . Heart palpitations 02/14/2015  . Hip bursitis 03/13/2014  . Trochanteric bursitis of left hip 03/12/2014  . Exertional chest pain 02/10/2014  . Preoperative cardiovascular examination 02/10/2014  . Angina pectoris, variant ([place]) 12/24/2012  . Left hip pain 12/16/2012  . Bursitis of left hip 12/16/2012  . Osteoarthritis, hip, bilateral 12/16/2012  . Chest pain 08/15/2012  . Apical variant hypertrophic cardiomyopathy ([place]) 08/15/2012  . Bilateral inguinal hernia, right symptomatic 09/21/2011  . COMEDO 10/18/2008  . POSTMENOPAUSAL OSTEOPOROSIS 09/01/2008  . HIP PAIN, LEFT 01/19/2008  . CARCINOMA, BASAL CELL, SKIN 10/02/2006  . VARICOSE VEIN 09/24/2006  . ACTINIC KERATOSIS 09/24/2006  . NECK PAIN 09/24/2006  . BACK PAIN 09/24/2006  . NEOP, BNG, SCALP/SKIN, NECK 04/16/2006  . FIBROIDS, UTERUS 04/16/2006  . Mixed hyperlipidemia 04/16/2006  . MACULAR DEGENERATION 04/16/2006  . Essential hypertension 04/16/2006  . ALLERGIC RHINITIS 04/16/2006  . OVERACTIVE BLADDER 04/16/2006  . URINARY INCONTINENCE 04/16/2006  . SKIN CANCER, HX OF 04/16/2006  . VERTIGO, HX OF 04/16/2006  . RENAL CALCULUS, HX OF 04/16/2006     Geraldine Solar PT, DPT  [facility] [address] [place], [place], [address] Phone: [phone]   Fax:  [phone]  Name: ZORANA BROCKWELL MRN: [id number] Date of Birth: July 29, 1935

## 2016-08-31 ENCOUNTER — Telehealth (HOSPITAL_COMMUNITY): Payer: Self-pay | Admitting: Family Medicine

## 2016-08-31 ENCOUNTER — Encounter: Payer: Self-pay | Admitting: Family Medicine

## 2016-08-31 ENCOUNTER — Ambulatory Visit (HOSPITAL_COMMUNITY): Payer: Medicare Other

## 2016-08-31 ENCOUNTER — Ambulatory Visit (INDEPENDENT_AMBULATORY_CARE_PROVIDER_SITE_OTHER): Payer: Medicare Other | Admitting: Family Medicine

## 2016-08-31 VITALS — BP 140/78 | HR 72 | Temp 97.9°F | Resp 14 | Ht 67.0 in | Wt 150.0 lb

## 2016-08-31 DIAGNOSIS — B029 Zoster without complications: Secondary | ICD-10-CM | POA: Diagnosis not present

## 2016-08-31 MED ORDER — VALACYCLOVIR HCL 1 G PO TABS: 1000.0000 mg | ORAL_TABLET | Freq: Three times a day (TID) | ORAL | 0 refills | Status: DC

## 2016-08-31 MED ORDER — HYDROCORTISONE 1 % EX CREA: 1.0000 "application " | TOPICAL_CREAM | Freq: Two times a day (BID) | CUTANEOUS | 0 refills | Status: DC

## 2016-08-31 NOTE — Telephone Encounter (Signed)
08/31/16  pt cx she said something had bitten her neck and she was going to dr to have it checked out

## 2016-08-31 NOTE — Patient Instructions (Signed)
Take valtrex Small amount of cortisone for itching  F/U as needed

## 2016-08-31 NOTE — Telephone Encounter (Signed)
08/31/16  pt cx earlier today because she felt that something had bitten her on her neck... she went to the dr and has shingles and has to stay in for a week so she cx next week's appts
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
## 2016-09-04 ENCOUNTER — Ambulatory Visit (HOSPITAL_COMMUNITY): Payer: Medicare Other

## 2016-09-06 ENCOUNTER — Ambulatory Visit (HOSPITAL_COMMUNITY): Payer: Medicare Other | Admitting: Physical Therapy

## 2016-10-11 DIAGNOSIS — M1711 Unilateral primary osteoarthritis, right knee: Secondary | ICD-10-CM | POA: Diagnosis not present

## 2016-11-02 ENCOUNTER — Telehealth: Payer: Self-pay | Admitting: Internal Medicine

## 2016-11-02 NOTE — Telephone Encounter (Signed)
Faxed paperwork to [facility] for [employer] patient assistance with the following questions answered: 1. Patient in provider's care? Y 2. Patient still on therapy? Y 3. Has dx or treatment plan changed? N 4. Does patient have coverage now? N - insurance same 5. Has income changed? N 6. Does patient still need assistance? Lauren House

## 2016-11-05 ENCOUNTER — Ambulatory Visit (INDEPENDENT_AMBULATORY_CARE_PROVIDER_SITE_OTHER): Payer: Medicare Other | Admitting: Family Medicine

## 2016-11-05 ENCOUNTER — Encounter: Payer: Self-pay | Admitting: Family Medicine

## 2016-11-05 VITALS — BP 138/78 | HR 72 | Temp 97.9°F | Resp 14 | Ht 67.0 in | Wt 148.0 lb

## 2016-11-05 DIAGNOSIS — I2510 Atherosclerotic heart disease of native coronary artery without angina pectoris: Secondary | ICD-10-CM | POA: Diagnosis not present

## 2016-11-05 DIAGNOSIS — Z780 Asymptomatic menopausal state: Secondary | ICD-10-CM

## 2016-11-05 DIAGNOSIS — E782 Mixed hyperlipidemia: Secondary | ICD-10-CM

## 2016-11-05 DIAGNOSIS — Z23 Encounter for immunization: Secondary | ICD-10-CM

## 2016-11-05 DIAGNOSIS — Z0000 Encounter for general adult medical examination without abnormal findings: Secondary | ICD-10-CM | POA: Diagnosis not present

## 2016-11-05 DIAGNOSIS — Z9861 Coronary angioplasty status: Secondary | ICD-10-CM

## 2016-11-05 LAB — LIPID PANEL
CHOL/HDL RATIO: 6.5 (calc) — AB (ref <5.0)
Cholesterol: 300 mg/dL — ABNORMAL HIGH (ref <200)
HDL: 46 mg/dL — ABNORMAL LOW (ref >50)
LDL CHOLESTEROL (CALC): 210 mg/dL — AB
Non-HDL Cholesterol (Calc): 254 mg/dL (calc) — ABNORMAL HIGH (ref <130)
Triglycerides: 249 mg/dL — ABNORMAL HIGH (ref <150)

## 2016-11-05 LAB — COMPLETE METABOLIC PANEL WITH GFR
AG Ratio: 1.3 (calc) (ref 1.0–2.5)
ALT: 11 U/L (ref 6–29)
AST: 14 U/L (ref 10–35)
Albumin: 4 g/dL (ref 3.6–5.1)
Alkaline phosphatase (APISO): 56 U/L (ref 33–130)
BUN / CREAT RATIO: 19 (calc) (ref 6–22)
BUN: 18 mg/dL (ref 7–25)
CALCIUM: 9.4 mg/dL (ref 8.6–10.4)
CO2: 28 mmol/L (ref 20–32)
CREATININE: 0.96 mg/dL — AB (ref 0.60–0.93)
Chloride: 102 mmol/L (ref 98–110)
GFR, Est African American: 65 mL/min/{1.73_m2} (ref >=60)
GFR, Est Non African American: 56 mL/min/{1.73_m2} — ABNORMAL LOW (ref >=60)
GLOBULIN: 3 g/dL (ref 1.9–3.7)
Glucose, Bld: 86 mg/dL (ref 65–99)
Potassium: 4.4 mmol/L (ref 3.5–5.3)
SODIUM: 136 mmol/L (ref 135–146)
Total Bilirubin: 0.5 mg/dL (ref 0.2–1.2)
Total Protein: 7 g/dL (ref 6.1–8.1)

## 2016-11-05 LAB — CBC WITH DIFFERENTIAL/PLATELET
BASOS ABS: 49 {cells}/uL (ref 0–200)
Basophils Relative: 0.6 %
EOS PCT: 1.1 %
Eosinophils Absolute: 89 cells/uL (ref 15–500)
HEMATOCRIT: 43.9 % (ref 35.0–45.0)
HEMOGLOBIN: 14.4 g/dL (ref 11.7–15.5)
LYMPHS ABS: 2479 {cells}/uL (ref 850–3900)
MCH: 28.2 pg (ref 27.0–33.0)
MCHC: 32.8 g/dL (ref 32.0–36.0)
MCV: 85.9 fL (ref 80.0–100.0)
MPV: 9.8 fL (ref 7.5–12.5)
Monocytes Relative: 8.1 %
NEUTROS ABS: 4828 {cells}/uL (ref 1500–7800)
Neutrophils Relative %: 59.6 %
Platelets: 251 10*3/uL (ref 140–400)
RBC: 5.11 10*6/uL — ABNORMAL HIGH (ref 3.80–5.10)
RDW: 13.1 % (ref 11.0–15.0)
Total Lymphocyte: 30.6 %
WBC mixed population: 656 cells/uL (ref 200–950)
WBC: 8.1 10*3/uL (ref 3.8–10.8)

## 2016-11-05 NOTE — Progress Notes (Signed)
Subjective:    Patient ID: Lauren House, female    DOB: 1936-02-08, 81 y.o.   MRN: [id number]  HPI She is a very pleasant 81 year old white female who is here today for physical exam. Past medical history is significant for coronary artery disease status post percutaneous stenting. She's completed 1 year of dual antiplatelet therapy and has now discontinued Plavix and is only taking aspirin. She is intolerant to statins. She is compliant with her beta blocker as well as her Ranexa. Because of her age, she is not recommended to have a colonoscopy. She has her mammogram scheduled for later this month. She is not recommended to have a Pap smear. She is due for a bone density test. Prevnar 13 and Pneumovax 23 are up-to-date. She is also due for a flu shot. She is due for the shingles vaccine Past Medical History:  Diagnosis Date  . Anxiety   . Apical variant hypertrophic cardiomyopathy ([place])   . Arthritis   . Borderline diabetes mellitus   . Bruises easily   . Bursitis of left hip   . Coronary atherosclerosis of native coronary artery    a. 2011: Cath showing nonobstructive CAD - 50-60% LAD (normal FFR) b. 06/2015: Cath showing 80% stenosis in the Mid LAD, tx w/ Promus DES 2.25 mm x 16 mm   . Essential hypertension, benign   . History of kidney stones   . History of skin cancer    Basal cell - abdomen  . Hyperlipidemia    Diet controlled.cannot take meds  . Nocturia   . Osteoporosis   . Overactive bladder   . Shortness of breath dyspnea   . Vertigo    Past Surgical History:  Procedure Laterality Date  . ABDOMINAL HYSTERECTOMY    . CARDIAC CATHETERIZATION N/A 07/22/2015   Procedure: Left Heart Cath and Coronary Angiography;  Surgeon: Leonie Man, MD;  Location: [facility];  Service: Cardiovascular;  Laterality: N/A;  . CARDIAC CATHETERIZATION N/A 07/22/2015   Procedure: Coronary Stent Intervention;  Surgeon: Leonie Man, MD;  Location: [facility];  Service:  Cardiovascular;  Laterality: N/A;  . CORONARY STENT PLACEMENT  07/22/2015   mid lad  des   . EYE SURGERY     cataracts bilateral  . HEMORRHOID SURGERY    . INGUINAL HERNIA REPAIR  10/11/2011   Procedure: HERNIA REPAIR INGUINAL ADULT;  Surgeon: Stark Klein, MD;  Location: [facility];  Service: General;  Laterality: Right;  . OPEN SURGICAL REPAIR OF GLUTEAL TENDON Left 03/12/2014   Procedure: LEFT HIP BURSECTOMY AND GLUTEAL TENDON REPAIR;  Surgeon: [name], MD;  Location: [facility];  Service: Orthopedics;  Laterality: Left;    Allergies  Allergen Reactions  . Chlorhexidine Itching  . Vancomycin Hives  . Aspirin Other (See Comments)    Nose bleeds with full strength  . Atorvastatin Other (See Comments)    Muscle pain  . Bactrim [Sulfamethoxazole-Trimethoprim] Other (See Comments)    Doesn't remember   . Cephalosporins Hives and Itching  . Ciprofloxacin Nausea Only  . Clarithromycin Other (See Comments)    Doesn't remember   . Colestipol Hcl Other (See Comments)    Hallucinations  . Ezetimibe Other (See Comments)    GI upset  . Simvastatin Other (See Comments)    Muscle spasms  . Penicillins Rash    Has patient had a PCN reaction causing immediate rash, facial/tongue/throat swelling, SOB or lightheadedness with hypotension: yes Has patient had a  PCN reaction causing severe rash involving mucus membranes or skin necrosis: no Has patient had a PCN reaction that required hospitalization no Has patient had a PCN reaction occurring within the last 10 years: no If all of the above answers are "NO", then may proceed with Cephalosporin use.   . Tape Rash   Social History   Social History  . Marital status: Widowed    Spouse name: N/A  . Number of children: N/A  . Years of education: N/A   Occupational History  . Not on file.   Social History Main Topics  . Smoking status: Never Smoker  . Smokeless tobacco: Never Used  . Alcohol use No  . Drug use: No  . Sexual activity: No    Other Topics Concern  . Not on file   Social History Narrative  . No narrative on file   Family History  Problem Relation Age of Onset  . Heart disease Father   . Lung cancer Sister   . Lung cancer Brother       Review of Systems  All other systems reviewed and are negative.      Objective:   Physical Exam  Constitutional: She is oriented to person, place, and time. She appears well-developed and well-nourished. No distress.  HENT:  Right Ear: External ear normal.  Left Ear: External ear normal.  Nose: Nose normal.  Mouth/Throat: Oropharynx is clear and moist. No oropharyngeal exudate.  Eyes: Pupils are equal, round, and reactive to light. Conjunctivae and EOM are normal. Right eye exhibits no discharge. Left eye exhibits no discharge. No scleral icterus.  Neck: Normal range of motion. Neck supple. No JVD present. No tracheal deviation present. No thyromegaly present.  Cardiovascular: Normal rate, regular rhythm, normal heart sounds and intact distal pulses.  Exam reveals no gallop and no friction rub.   No murmur heard. Pulmonary/Chest: Effort normal and breath sounds normal. No stridor. No respiratory distress. She has no wheezes. She has no rales. She exhibits no tenderness.  Abdominal: Soft. Bowel sounds are normal. She exhibits no distension and no mass. There is no tenderness. There is no rebound and no guarding.  Musculoskeletal: Normal range of motion. She exhibits no edema, tenderness or deformity.  Lymphadenopathy:    She has no cervical adenopathy.  Neurological: She is alert and oriented to person, place, and time. She has normal reflexes. She displays normal reflexes. No cranial nerve deficit. She exhibits normal muscle tone. Coordination normal.  Skin: Skin is warm. No rash noted. She is not diaphoretic. No erythema. No pallor.  Psychiatric: She has a normal mood and affect. Her behavior is normal. Judgment and thought content normal.  Vitals  reviewed.         Assessment & Plan:  Routine general medical examination at a health care facility  ASCVD (arteriosclerotic cardiovascular disease)  CAD S/P percutaneous coronary angioplasty - Plan: CBC with Differential/Platelet, COMPLETE METABOLIC PANEL WITH GFR, Lipid panel  Mixed hyperlipidemia  Need for immunization against influenza - Plan: Flu Vaccine QUAD 36+ mos IM  Postmenopausal estrogen deficiency - Plan: DG Bone Density  Physical exam today is normal. Blood pressure is excellent. Following depression screening is up-to-date. Patient received her flu shot. I recommended a shingles vaccine but she will check on the price. Mammogram is artery been scheduled. I'll schedule her for a bone density. Colonoscopy and Pap smear are no longer recommended because of her age. I will check a CBC, CMP, fasting lipid panel. Her goal LDL  cholesterol be less than 70. She has an intolerance to statins. Consider praluent.  Await the results of lab work. Also recommended calcium 1200 mg a day and vitamin D to help prevent osteoporosis.

## 2016-11-07 ENCOUNTER — Encounter: Payer: Self-pay | Admitting: Family Medicine

## 2016-11-15 ENCOUNTER — Other Ambulatory Visit: Payer: Self-pay | Admitting: Family Medicine

## 2016-11-15 ENCOUNTER — Ambulatory Visit (HOSPITAL_COMMUNITY)
Admission: RE | Admit: 2016-11-15 | Discharge: 2016-11-15 | Disposition: A | Discharge status: Home / Self Care | Payer: Medicare Other | Source: Ambulatory Visit | Attending: Family Medicine | Admitting: Family Medicine

## 2016-11-15 DIAGNOSIS — Z1231 Encounter for screening mammogram for malignant neoplasm of breast: Secondary | ICD-10-CM | POA: Diagnosis not present

## 2016-11-15 DIAGNOSIS — M810 Age-related osteoporosis without current pathological fracture: Secondary | ICD-10-CM | POA: Insufficient documentation

## 2016-11-15 DIAGNOSIS — Z780 Asymptomatic menopausal state: Secondary | ICD-10-CM

## 2016-11-21 ENCOUNTER — Encounter: Payer: Self-pay | Admitting: Family Medicine

## 2016-11-21 ENCOUNTER — Other Ambulatory Visit: Payer: Self-pay | Admitting: *Deleted

## 2016-11-21 ENCOUNTER — Ambulatory Visit (INDEPENDENT_AMBULATORY_CARE_PROVIDER_SITE_OTHER): Payer: Medicare Other | Admitting: Family Medicine

## 2016-11-21 ENCOUNTER — Ambulatory Visit (HOSPITAL_COMMUNITY)
Admission: RE | Admit: 2016-11-21 | Discharge: 2016-11-21 | Disposition: A | Discharge status: Home / Self Care | Payer: Medicare Other | Source: Ambulatory Visit | Attending: Family Medicine | Admitting: Family Medicine

## 2016-11-21 VITALS — BP 132/72 | HR 62 | Temp 98.4°F | Resp 16 | Ht 67.0 in | Wt 147.0 lb

## 2016-11-21 DIAGNOSIS — J40 Bronchitis, not specified as acute or chronic: Secondary | ICD-10-CM | POA: Diagnosis not present

## 2016-11-21 DIAGNOSIS — R0781 Pleurodynia: Secondary | ICD-10-CM | POA: Diagnosis present

## 2016-11-21 DIAGNOSIS — R0602 Shortness of breath: Secondary | ICD-10-CM | POA: Diagnosis not present

## 2016-11-21 DIAGNOSIS — R05 Cough: Secondary | ICD-10-CM | POA: Diagnosis not present

## 2016-11-21 MED ORDER — BENZONATATE 100 MG PO CAPS: 200.0000 mg | ORAL_CAPSULE | Freq: Three times a day (TID) | ORAL | 0 refills | Status: DC | PRN

## 2016-11-21 MED ORDER — PREDNISONE 20 MG PO TABS: 40.0000 mg | ORAL_TABLET | Freq: Every day | ORAL | 0 refills | Status: DC

## 2016-11-21 NOTE — Patient Instructions (Signed)
Get the xray of your chest  Cor[place]idan HBP and tessalon perrles  F/U pending results

## 2016-11-21 NOTE — Progress Notes (Signed)
   Subjective:    Patient ID: Lauren House, female    DOB: 1935/07/21, 81 y.o.   MRN: [id number]  Patient presents for Illness (x3 weeks- states that she got her flu shot on 9/10 and has been sick since then- productive cough, rib pain, chest congestion, low grade fever)   Has had cough with minimal production, 99-100F , yesterday AM had yesterday. States symptoms started the day after she received the flu shot. No sinus drainage, but mild sore throat and ear pain.  She has taken Mucinex, took a dose of codiene cough medicine  No known sick contacts she's also had some soreness in her ribs  Review Of Systems:  GEN- denies fatigue, fever, weight loss,weakness, recent illness HEENT- denies eye drainage, change in vision, nasal discharge, CVS- denies chest pain, palpitations RESP- denies SOB, +cough, wheeze ABD- denies N/V, change in stools, abd pain GU- denies dysuria, hematuria, dribbling, incontinence MSK- denies joint pain, muscle aches, injury Neuro- denies headache, dizziness, syncope, seizure activity       Objective:    BP 132/72   Pulse 62   Temp 98.4 F (36.9 C) (Oral)   Resp 16   Ht 5\' 7"  (1.702 m)   Wt 147 lb (66.7 kg)   SpO2 96%   BMI 23.02 kg/m  GEN- NAD, alert and oriented x3, non toxic appearing HEENT- PERRL, EOMI, non injected sclera, pink conjunctiva, MMM, oropharynx clear, nares clear, TM clear bilat no effusion  Neck- Supple, no LAD CVS- RRR, no murmur RESP- few scattered wheezes in bases, good air movement, no rales Chest wall- mild TTP lower right ribs ABD-NABS,soft,NT,ND EXT- No edema Pulses- Radial 2+        Assessment & Plan:      Problem List Items Addressed This Visit    None    Visit Diagnoses    Bronchitis    -  Primary   CXR obtained no rib fracture, no PNA, no effusion, treat as viral etiology, prednisone given and tessalon. Advised I do not think this is due to flu shot   Relevant Orders   DG Chest 2 View (Completed)      Note: This dictation was prepared with Dragon dictation along with smaller phrase technology. Any transcriptional errors that result from this process are unintentional.

## 2016-12-03 ENCOUNTER — Encounter (HOSPITAL_COMMUNITY): Payer: Self-pay

## 2016-12-03 NOTE — Therapy (Signed)
[facility] [place], [place], [address] Phone: [phone]   Fax:  [phone]  Patient Details  Name: Lauren House MRN: [id number] Date of Birth: [birth year] Referring Provider:  No ref. provider found  Encounter Date: 12/03/2016    PHYSICAL THERAPY DISCHARGE SUMMARY  Visits from Start of Care: 9  Current functional level related to goals / functional outcomes: See last treatment note   Remaining deficits: See last treatment note   Education / Equipment: n/a Plan: Patient agrees to discharge.  Patient goals were not met. Patient is being discharged due to not returning since the last visit.  ?????       Geraldine Solar PT, [facility] [address] [place], [place], [address] Phone: [phone]   Fax:  [phone]

## 2016-12-05 ENCOUNTER — Ambulatory Visit: Payer: Medicare Other | Admitting: Internal Medicine

## 2017-01-01 ENCOUNTER — Encounter: Payer: Self-pay | Admitting: Internal Medicine

## 2017-01-01 ENCOUNTER — Ambulatory Visit: Payer: Medicare Other | Admitting: Internal Medicine

## 2017-01-01 VITALS — BP 144/78 | HR 63 | Ht 67.0 in | Wt 149.0 lb

## 2017-01-01 DIAGNOSIS — I422 Other hypertrophic cardiomyopathy: Secondary | ICD-10-CM | POA: Diagnosis not present

## 2017-01-01 DIAGNOSIS — I2510 Atherosclerotic heart disease of native coronary artery without angina pectoris: Secondary | ICD-10-CM

## 2017-01-01 DIAGNOSIS — I2583 Coronary atherosclerosis due to lipid rich plaque: Secondary | ICD-10-CM

## 2017-01-01 DIAGNOSIS — I10 Essential (primary) hypertension: Secondary | ICD-10-CM

## 2017-01-01 DIAGNOSIS — Z9861 Coronary angioplasty status: Secondary | ICD-10-CM

## 2017-01-01 DIAGNOSIS — E782 Mixed hyperlipidemia: Secondary | ICD-10-CM | POA: Diagnosis not present

## 2017-01-01 NOTE — Patient Instructions (Signed)
Dr Debara Pickett recommends that you schedule a follow-up appointment in 6 months. You will receive a reminder letter in the mail two months in advance. If you don't receive a letter, please call our office to schedule the follow-up appointment.  If you need a refill on your cardiac medications before your next appointment, please call your pharmacy.   Your physician recommends that you schedule a follow-up appointment with Raquel, PharmD in the lipid clinc.

## 2017-01-01 NOTE — Progress Notes (Signed)
OFFICE NOTE  Chief Complaint:  No complaints  Primary Care Physician: Susy Frizzle, MD  HPI:  WILHELMINE KROGSTAD is a pleasant 81 year old female I have been following for history of apical hypertrophic cardiomyopathy for which she has done well on Ranexa 500 mg daily. Recently, she was having right inguinal pain and had a hernia for which she had repair. That, unfortunately, she says has caused her more harm than good and she continues to have more pain in her right hip area which possibly is related to the hip. She saw Dr. Wynelle Link at [facility] for further evaluation from that and has received 2 hip injections which have been beneficial. He did mention that he felt she might need a hip replacement. Recently she had a recurrent episode of chest pain. This caused her to the hospital and improved with nitroglycerin. She was ruled out for MI and discharged. Unfortunately she returned several days later after she developed swelling, warmth and pain in her left forearm. It was felt that she might have possibly had an infected IV site and clearly had superficial thrombophlebitis. She was placed on antibiotics for this and is being treated with some improvement in her symptoms. After her emergency room visit, it was recommended that she increase her Ranexa 500 mg twice daily which is the recommended starting dose. She's had no further chest pain episodes. She had prior cardiac catheterization in 2011 which demonstrated a 50-60% LAD lesion. She underwent FFR for this which was negative with an FFR of 0.99. She had had a stress test at that time which indicated no evidence of reversible ischemia., but was having persistent chest pain.  It is been felt that her chest pain was due to small vessel disease in the setting of significant apical variant hypertrophic cardiomyopathy. This has been treated most effectively with ranolazine, and nitrates to some extent. Although her symptoms now have  escalated. This is concerning to me for possible worsening of her LAD disease.  Mrs. Jewel underwent a nuclear stress test on 02/03/2013. This was negative for ischemia with an EF of 52%. Since then she has reported some improvement in her symptoms. Her main complaints now have to do with left hip pain.  She is still contemplating surgery. She reported that her husband was recently diagnosed with esophageal cancer and it seems to be quite advanced. There did not appear to be any surgical options. This has caused her significant anxiety but she reports no chest pain.  Nanci returns today for follow-up. Unfortunately she mentioned that her husband died about 3 months ago. He was diagnosed with esophageal cancer which was aggressive and inoperable. She is still grieving which is to be expected. She continues to have problems with her hip and at this point apparently will need hip surgery, but probably not hip replacement. That is scheduled about one month from now. She does have a history of moderate LAD stenosis in 2011 with a negative stress test one year ago. Recently, she has had worsening shortness of breath and chest pressure with exertion. She initially felt this was due to stress however her symptoms have been somewhat progressive and concern me for ischemia. Her EKG shows stable T-wave inversions consistent with her hypertrophic cardiomyopathy.  I saw Ms. Demo back in the office today. Overall she feels like she is doing well. She did have some mild chest discomfort last week and took a nitroglycerin. This is when she was in [place] with her son  and daughter-in-law and they were walking all over town. She managed to walk for several hours without any significant shortness of breath but did have some mild chest discomfort which was relieved fairly quickly with nitroglycerin. Overall she is doing well. She had her hip replacement on the left side which she is recovering from. She still has discomfort  when sitting in a car for long periods of time. She tells me that she's given a be going up to [place] fairly shortly on a vacation and that they will be going up to [facility] for the first time.'  Mrs. Paulding returns today for follow-up. She tells me on Saturday night she was awakened early in the morning with palpitations and a warm feeling as well as heaviness in her chest. She says it was quite intense. She denies any vivid dreams prior to that. She's not had any further episodes of chest pain since that or recurrent palpitations. She took a nitroglycerin and the symptoms resolved fairly promptly. She was able to go back to bed. Her EKGs is unchanged showing the T-wave inversions typical of apical variant hypertrophic cardiomyopathy. Her last stress test was in 2014 it was negative for ischemia. She does have a known 50-60% LAD stenosis which was negative by FFR.   07/12/2015  Mrs. Worsley was seen today in the office for follow-up. Recently she went to vacation with her family and they noticed more progressive shortness of breath and chest pressure. This was keeping her from doing certain activities. She says her symptoms of gotten worse since her last office visit in December. At that time I performed a nuclear stress test which showed a low normal EF 51% but no reversible ischemia. She does have known 50-60% LAD stenosis which was negative by FFR by catheterization in 2012.  08/26/2015  Mrs. Hendershott returns today for follow-up. She underwent cardiac catheterization for progressive chest pain concerning for unstable angina on 07/22/2015 by Dr. Ellyn Hack, this demonstrated the following:  1. Mid LAD lesion, 80% stenosed. Post intervention with Promus DES 2.25 mm x 16 mm (2.4 mm), there is a 0% residual stenosis. 2. Ost LAD lesion, 50% stenosed. stable from original catheterization 3. The left ventricular systolic function is normal. with minimal apical filling consistent with known apical  hypertrophy. 4. Otherwise mild to moderate disease in the circumflex and RCA system  Since her coronary angiography and stent placement, she reports she is doing fairly well. She started back doing some exercise and push mowed her lawn the other day. Unfortunate she developed a cracked tooth and she's been having problems with pain. She took antibiotics including doxycycline which causes her stomach upset. She has a temporary crown and will be going back for some more dental work. On questioning today she reports that she has discontinued her Plavix. She said she's been off of it for about one week because of spontaneous bruising. She was not counseled to discontinue this and I reiterated the fact today that being off of dual antiplatelet therapy could put her at risk for acute in-stent thrombosis which could lead to massive heart attack.  11/18/2015  Mrs. Maxham was seen today in follow-up. She has been undergoing cardiac rehabilitation. She restart her Plavix which is very important given her recent stent in May of this past year. She denies any chest pain but still gets short of breath. She says is fairly stable but not necessarily getting worse. She is now gone through 8 sessions rehabilitation and is up  to 3.4 metabolic equivalents. Blood pressures been fairly well-controlled.  05/22/2016  Mrs. Borah returns today for follow-up. When I last saw her she was undergoing cardiac rehabilitation and reported shortness of breath with exertion. This was shortly after her stenting I recommended continued cardiac rehabilitation. She says that after finishing rehabilitation she has not had any improvement in her shortness of breath and perhaps it may be worse. She notes it when walking up and down her stairs to her basement. She says after the second trip up the stairs she does get short of breath. She is under a lot of stress and there are some family issues which required her grandson to move in with her. She  denies any chest pain. Blood pressure was elevated today 161/85. She says it is always up in the office, however was 140/80 last time.  06/26/2016  Mrs. Drozdowski was seen today in follow-up. Her echocardiogram shows preserved systolic function, mild diastolic dysfunction and elevated LV filling pressure. We understand that she would have diastolic dysfunction secondary to apical hypertrophic cardiomyopathy. She continues to have shortness of breath with exertion. Her BNP is very low less than 100 and stable. I suspect her symptoms may be due to diastolic dysfunction elevated LVEDP. Blood pressure was over 756 systolic today. In addition, recent lipid profile indicated 2 cholesterol 279, triglycerides 216, HDL 48 and LDL-C1 88. Given her coronary artery disease her goal LDL-C is less than 70. She has been intolerant to numerous statins including rosuvastatin, atorvastatin and pravastatin. She is close to one year since her drug-eluting stent in the LAD.  01/01/2017  Mrs. Perfetti returns today for follow-up.  General she seems to be doing pretty well.  In May we referred her for evaluation to start a PC SK 9 inhibitor.  She had a discussion about starting Repatha however was concerned about cost since she did not have drug coverage.  She is also concerned about not taking the medicine.  She says she ultimately wants to be with her deceased husband.  That being said, she is willing to entertain the medication after I told her today that the cost of come down significantly.  Her cholesterol was recently assessed and LDL remains over 200 untreated.  She has been intolerant to statins as previously outlined in my notes.  Also mentions she has had shingles and 2 bouts of pneumonia since I last saw her.  PMHx:  Past Medical History:  Diagnosis Date  . Anxiety   . Apical variant hypertrophic cardiomyopathy ([place])   . Arthritis   . Borderline diabetes mellitus   . Bruises easily   . Bursitis of left hip   .  Coronary atherosclerosis of native coronary artery    a. 2011: Cath showing nonobstructive CAD - 50-60% LAD (normal FFR) b. 06/2015: Cath showing 80% stenosis in the Mid LAD, tx w/ Promus DES 2.25 mm x 16 mm   . Essential hypertension, benign   . History of kidney stones   . History of skin cancer    Basal cell - abdomen  . Hyperlipidemia    Diet controlled.cannot take meds  . Nocturia   . Osteoporosis   . Overactive bladder   . Shortness of breath dyspnea   . Vertigo     Past Surgical History:  Procedure Laterality Date  . ABDOMINAL HYSTERECTOMY    . CORONARY STENT PLACEMENT  07/22/2015   mid lad  des   . EYE SURGERY     cataracts bilateral  .  HEMORRHOID SURGERY      FAMHx:  Family History  Problem Relation Age of Onset  . Heart disease Father   . Lung cancer Sister   . Lung cancer Brother     SOCHx:   reports that  has never smoked. she has never used smokeless tobacco. She reports that she does not drink alcohol or use drugs.  ALLERGIES:  Allergies  Allergen Reactions  . Chlorhexidine Itching  . Vancomycin Hives  . Aspirin Other (See Comments)    Nose bleeds with full strength  . Atorvastatin Other (See Comments)    Muscle pain  . Bactrim [Sulfamethoxazole-Trimethoprim] Other (See Comments)    Doesn't remember   . Cephalosporins Hives and Itching  . Ciprofloxacin Nausea Only  . Clarithromycin Other (See Comments)    Doesn't remember   . Colestipol Hcl Other (See Comments)    Hallucinations  . Ezetimibe Other (See Comments)    GI upset  . Simvastatin Other (See Comments)    Muscle spasms  . Penicillins Rash    Has patient had a PCN reaction causing immediate rash, facial/tongue/throat swelling, SOB or lightheadedness with hypotension: yes Has patient had a PCN reaction causing severe rash involving mucus membranes or skin necrosis: no Has patient had a PCN reaction that required hospitalization no Has patient had a PCN reaction occurring within the last  10 years: no If all of the above answers are "NO", then may proceed with Cephalosporin use.   . Tape Rash    ROS: Pertinent items noted in HPI and remainder of comprehensive ROS otherwise negative.  HOME MEDS: Current Outpatient Medications  Medication Sig Dispense Refill  . aspirin EC 81 MG tablet Take 81 mg by mouth daily.    . benzonatate (TESSALON) 100 MG capsule Take 2 capsules (200 mg total) by mouth 3 (three) times daily as needed for cough. 20 capsule 0  . chlorthalidone (HYGROTON) 25 MG tablet Take 0.5 tablets (12.5 mg total) by mouth daily. 45 tablet 3  . Cyanocobalamin (VITAMIN B12 PO) Take 1 tablet by mouth daily.    . fluticasone (FLONASE) 50 MCG/ACT nasal spray USE TWO SPRAY IN EACH NOSTRIL EVERY DAY 50 g 1  . hydrocortisone cream 1 % Apply 1 application topically 2 (two) times daily. 30 g 0  . metoprolol tartrate (LOPRESSOR) 25 MG tablet TAKE ONE-HALF TABLET BY MOUTH TWICE DAILY 30 tablet 7  . Multiple Vitamins-Minerals (OCUVITE PO) Take 1 tablet by mouth daily.    . nitroGLYCERIN (NITROSTAT) 0.4 MG SL tablet Place 1 tablet (0.4 mg total) under the tongue every 5 (five) minutes as needed for chest pain. 25 tablet 3  . Polyethyl Glycol-Propyl Glycol (SYSTANE OP) Apply 1 drop to eye 3 (three) times daily as needed (Dry eyes).    . predniSONE (DELTASONE) 20 MG tablet Take 2 tablets (40 mg total) by mouth daily with breakfast. 10 tablet 0  . ranolazine (RANEXA) 500 MG 12 hr tablet Take 1 tablet (500 mg total) by mouth 2 (two) times daily. 180 tablet 3   No current facility-administered medications for this visit.     LABS/IMAGING: No results found for this or any previous visit (from the past 48 hour(s)). No results found.  VITALS: BP (!) 144/78   Pulse 63   Ht 5\' 7"  (1.702 m)   Wt 149 lb (67.6 kg)   BMI 23.34 kg/m   EXAM: General appearance: alert and no distress Neck: no carotid bruit, no JVD and thyroid not enlarged, symmetric,  no  tenderness/mass/nodules Lungs: clear to auscultation bilaterally Heart: regular rate and rhythm, S1, S2 normal, no murmur, click, rub or gallop Abdomen: soft, non-tender; bowel sounds normal; no masses,  no organomegaly Extremities: extremities normal, atraumatic, no cyanosis or edema Pulses: 2+ and symmetric Skin: Skin color, texture, turgor normal. No rashes or lesions Neurologic: Grossly normal Psych: Pleasant  EKG: Normal sinus rhythm at 63, voltage criteria for LVH, anterolateral T wave inversions-consistent with known apical hypertrophic cardiomyopathy, QTC 480 ms-personally reviewed  ASSESSMENT: 1. History of DOE - echo with normal systolic function, mild diastolic dysfunction and elevated LVEDP  2. Unstable angina - status post PCI with a Promus DES (2.25 mm x 16 mm to the mid LAD)- known 50-60% ostial LAD stenosis (FFR 0.99 in 2011) which was stable at repeat cath (06/2015) 3. Apical variant hypertrophic cardimyopathy - anterolateral TWI's - negative nuclear stress test 01-2015 4. Hypertension 5. Venous insufficiency 6. Right inguinal pain with a hernia, status post surgical repair 7. Right hip pain with degenerative hip disease - s/p surgery 8. Statin intolerance with high LDL  PLAN: 1.   Mrs. Fede seems to be asymptomatic without worsening shortness of breath or chest pain.  She is now struggling with some right knee pain.  Reports that she has had 2 episodes of bronchitis and shingles.  I encouraged her to get the shingles vaccine.  She continues to have a very high LDL cholesterol and is been statin intolerant.  I did previously evaluated her to start a PC SK 9 inhibitor, however she was concerned about cost and never followed up on that.  Her LDL remains greater than 200.  Given her known coronary artery disease, will again try to get her on medication.  She seems to be agreeable to try it.  Follow-up with me in 6 months.  [name], MD, [facility] Attending  Cardiologist [place] C Marcos Ruelas 01/01/2017, 9:24 AM

## 2017-01-08 NOTE — Addendum Note (Signed)
Addended by: Venetia Maxon on: 01/08/2017 01:50 PM   Modules accepted: Orders

## 2017-01-11 ENCOUNTER — Encounter (HOSPITAL_COMMUNITY): Payer: Self-pay | Admitting: Emergency Medicine

## 2017-01-11 ENCOUNTER — Emergency Department (HOSPITAL_COMMUNITY): Payer: Medicare Other

## 2017-01-11 ENCOUNTER — Other Ambulatory Visit: Payer: Self-pay

## 2017-01-11 ENCOUNTER — Emergency Department (HOSPITAL_COMMUNITY)
Admission: EM | Admit: 2017-01-11 | Discharge: 2017-01-11 | Disposition: A | Discharge status: Home / Self Care | Payer: Medicare Other | Attending: Emergency Medicine | Admitting: Emergency Medicine

## 2017-01-11 DIAGNOSIS — M79662 Pain in left lower leg: Secondary | ICD-10-CM | POA: Insufficient documentation

## 2017-01-11 DIAGNOSIS — R531 Weakness: Secondary | ICD-10-CM | POA: Diagnosis not present

## 2017-01-11 DIAGNOSIS — Z85828 Personal history of other malignant neoplasm of skin: Secondary | ICD-10-CM | POA: Insufficient documentation

## 2017-01-11 DIAGNOSIS — Z7982 Long term (current) use of aspirin: Secondary | ICD-10-CM | POA: Insufficient documentation

## 2017-01-11 DIAGNOSIS — I2510 Atherosclerotic heart disease of native coronary artery without angina pectoris: Secondary | ICD-10-CM | POA: Insufficient documentation

## 2017-01-11 DIAGNOSIS — F419 Anxiety disorder, unspecified: Secondary | ICD-10-CM | POA: Insufficient documentation

## 2017-01-11 DIAGNOSIS — Z79899 Other long term (current) drug therapy: Secondary | ICD-10-CM | POA: Insufficient documentation

## 2017-01-11 DIAGNOSIS — M79661 Pain in right lower leg: Secondary | ICD-10-CM | POA: Diagnosis not present

## 2017-01-11 DIAGNOSIS — R404 Transient alteration of awareness: Secondary | ICD-10-CM | POA: Diagnosis not present

## 2017-01-11 DIAGNOSIS — M47816 Spondylosis without myelopathy or radiculopathy, lumbar region: Secondary | ICD-10-CM | POA: Diagnosis not present

## 2017-01-11 LAB — I-STAT CHEM 8, ED
BUN: 16 mg/dL (ref 6–20)
CHLORIDE: 104 mmol/L (ref 101–111)
CREATININE: 0.9 mg/dL (ref 0.44–1.00)
Calcium, Ion: 1.15 mmol/L (ref 1.15–1.40)
GLUCOSE: 114 mg/dL — AB (ref 65–99)
HEMATOCRIT: 42 % (ref 36.0–46.0)
Hemoglobin: 14.3 g/dL (ref 12.0–15.0)
POTASSIUM: 3.9 mmol/L (ref 3.5–5.1)
Sodium: 139 mmol/L (ref 135–145)
TCO2: 27 mmol/L (ref 22–32)

## 2017-01-11 MED ORDER — HYDROCODONE-ACETAMINOPHEN 5-325 MG PO TABS
1.0000 | ORAL_TABLET | Freq: Once | ORAL | Status: AC
  Administered 2017-01-11: 1 via ORAL
  Filled 2017-01-11: qty 1

## 2017-01-11 MED ORDER — METHOCARBAMOL 500 MG PO TABS: 500.0000 mg | ORAL_TABLET | Freq: Three times a day (TID) | ORAL | 0 refills | Status: DC | PRN

## 2017-01-11 MED ORDER — HYDROCODONE-ACETAMINOPHEN 5-325 MG PO TABS: 1.0000 | ORAL_TABLET | Freq: Once | ORAL | Status: DC

## 2017-01-11 MED ORDER — HYDROCODONE-ACETAMINOPHEN 5-325 MG PO TABS: ORAL_TABLET | ORAL | 0 refills | Status: DC

## 2017-01-11 NOTE — ED Triage Notes (Addendum)
Pt states that she has left lower leg pain that woke her up from sleep. Pt denies fall or any recent injury. Pt states she went on a "long car ride to [place] yesterday."

## 2017-01-11 NOTE — ED Provider Notes (Signed)
[facility] EMERGENCY DEPARTMENT Provider Note   CSN: [id number] Arrival date & time: 01/11/17  0604     History   Chief Complaint Chief Complaint  Patient presents with  . Leg Pain    HPI Lauren House is a 81 y.o. female.  HPI Pt was seen at [place]. Per pt and her family, c/o gradual onset and persistence of constant left lower calf "pain" that woke her up from sleep this morning PTA. Pt describes it as "throbbing," worsens with palpation of the area and walking. Pt states she went on a 2 hour car ride yesterday. Denies injury, no fevers, no focal motor weakness, no tingling/numbness in extremities, no abd pain, no back pain, no rash, no joint pain.   Past Medical History:  Diagnosis Date  . Anxiety   . Apical variant hypertrophic cardiomyopathy ([place])   . Arthritis   . Borderline diabetes mellitus   . Bruises easily   . Bursitis of left hip   . Coronary atherosclerosis of native coronary artery    a. 2011: Cath showing nonobstructive CAD - 50-60% LAD (normal FFR) b. 06/2015: Cath showing 80% stenosis in the Mid LAD, tx w/ Promus DES 2.25 mm x 16 mm   . Essential hypertension, benign   . History of kidney stones   . History of skin cancer    Basal cell - abdomen  . Hyperlipidemia    Diet controlled.cannot take meds  . Nocturia   . Osteoporosis   . Overactive bladder   . Shortness of breath dyspnea   . Vertigo     Patient Active Problem List   Diagnosis Date Noted  . Coronary artery disease due to lipid rich plaque 01/01/2017  . CAD S/P percutaneous coronary angioplasty 07/22/2015  . DOE (dyspnea on exertion)   . Unstable angina ([place]) 07/12/2015  . Heart palpitations 02/14/2015  . Hip bursitis 03/13/2014  . Trochanteric bursitis of left hip 03/12/2014  . Exertional chest pain 02/10/2014  . Preoperative cardiovascular examination 02/10/2014  . Angina pectoris, variant ([place]) 12/24/2012  . Left hip pain 12/16/2012  . Bursitis of left hip 12/16/2012  .  Osteoarthritis, hip, bilateral 12/16/2012  . Chest pain 08/15/2012  . Apical variant hypertrophic cardiomyopathy ([place]) 08/15/2012  . Bilateral inguinal hernia, right symptomatic 09/21/2011  . COMEDO 10/18/2008  . POSTMENOPAUSAL OSTEOPOROSIS 09/01/2008  . HIP PAIN, LEFT 01/19/2008  . CARCINOMA, BASAL CELL, SKIN 10/02/2006  . VARICOSE VEIN 09/24/2006  . ACTINIC KERATOSIS 09/24/2006  . NECK PAIN 09/24/2006  . BACK PAIN 09/24/2006  . NEOP, BNG, SCALP/SKIN, NECK 04/16/2006  . FIBROIDS, UTERUS 04/16/2006  . Mixed hyperlipidemia 04/16/2006  . MACULAR DEGENERATION 04/16/2006  . Essential hypertension 04/16/2006  . ALLERGIC RHINITIS 04/16/2006  . OVERACTIVE BLADDER 04/16/2006  . URINARY INCONTINENCE 04/16/2006  . SKIN CANCER, HX OF 04/16/2006  . VERTIGO, HX OF 04/16/2006  . RENAL CALCULUS, HX OF 04/16/2006    Past Surgical History:  Procedure Laterality Date  . ABDOMINAL HYSTERECTOMY    . CARDIAC CATHETERIZATION N/A 07/22/2015   Procedure: Left Heart Cath and Coronary Angiography;  Surgeon: Leonie Man, MD;  Location: [place] [facility];  Service: Cardiovascular;  Laterality: N/A;  . CARDIAC CATHETERIZATION N/A 07/22/2015   Procedure: Coronary Stent Intervention;  Surgeon: Leonie Man, MD;  Location: [place] [facility];  Service: Cardiovascular;  Laterality: N/A;  . CORONARY STENT PLACEMENT  07/22/2015   mid lad  des   . EYE SURGERY     cataracts bilateral  .  HEMORRHOID SURGERY    . INGUINAL HERNIA REPAIR  10/11/2011   Procedure: HERNIA REPAIR INGUINAL ADULT;  Surgeon: Stark Klein, MD;  Location: [facility];  Service: General;  Laterality: Right;  . OPEN SURGICAL REPAIR OF GLUTEAL TENDON Left 03/12/2014   Procedure: LEFT HIP BURSECTOMY AND GLUTEAL TENDON REPAIR;  Surgeon: [name], MD;  Location: [facility];  Service: Orthopedics;  Laterality: Left;    OB History    Gravida Para Term Preterm AB Living   4 4 4     4    SAB TAB Ectopic Multiple Live Births                    Home Medications    Prior to Admission medications   Medication Sig Start Date End Date Taking? Authorizing Provider  aspirin EC 81 MG tablet Take 81 mg by mouth daily.    [provider]  benzonatate (TESSALON) 100 MG capsule Take 2 capsules (200 mg total) by mouth 3 (three) times daily as needed for cough. 11/21/16   Alycia Rossetti, MD  chlorthalidone (HYGROTON) 25 MG tablet Take 0.5 tablets (12.5 mg total) by mouth daily. 06/26/16   Hilty, Nadean Corwin, MD  Cyanocobalamin (VITAMIN B12 PO) Take 1 tablet by mouth daily.    [provider]  fluticasone (FLONASE) 50 MCG/ACT nasal spray USE TWO SPRAY IN EACH NOSTRIL EVERY DAY 11/01/15   Susy Frizzle, MD  hydrocortisone cream 1 % Apply 1 application topically 2 (two) times daily. 08/31/16   Alycia Rossetti, MD  metoprolol tartrate (LOPRESSOR) 25 MG tablet TAKE ONE-HALF TABLET BY MOUTH TWICE DAILY 08/07/16   Lelon Perla, MD  Multiple Vitamins-Minerals (OCUVITE PO) Take 1 tablet by mouth daily.    [provider]  nitroGLYCERIN (NITROSTAT) 0.4 MG SL tablet Place 1 tablet (0.4 mg total) under the tongue every 5 (five) minutes as needed for chest pain. 08/26/14   Hilty, Nadean Corwin, MD  Polyethyl Glycol-Propyl Glycol (SYSTANE OP) Apply 1 drop to eye 3 (three) times daily as needed (Dry eyes).    [provider]  predniSONE (DELTASONE) 20 MG tablet Take 2 tablets (40 mg total) by mouth daily with breakfast. 11/21/16   Alycia Rossetti, MD  ranolazine (RANEXA) 500 MG 12 hr tablet Take 1 tablet (500 mg total) by mouth 2 (two) times daily. 05/31/16   Hilty, Nadean Corwin, MD    Family History Family History  Problem Relation Age of Onset  . Heart disease Father   . Lung cancer Sister   . Lung cancer Brother     Social History Social History   Tobacco Use  . Smoking status: Never Smoker  . Smokeless tobacco: Never Used  Substance Use Topics  . Alcohol use: No  . Drug use: No     Allergies    Chlorhexidine; Vancomycin; Aspirin; Atorvastatin; Bactrim [sulfamethoxazole-trimethoprim]; Cephalosporins; Ciprofloxacin; Clarithromycin; Colestipol hcl; Ezetimibe; Prednisone; Simvastatin; Penicillins; and Tape   Review of Systems Review of Systems ROS: Statement: All systems negative except as marked or noted in the HPI; Constitutional: Negative for fever and chills. ; ; Eyes: Negative for eye pain, redness and discharge. ; ; ENMT: Negative for ear pain, hoarseness, nasal congestion, sinus pressure and sore throat. ; ; Cardiovascular: Negative for chest pain, palpitations, diaphoresis, dyspnea and peripheral edema. ; ; Respiratory: Negative for cough, wheezing and stridor. ; ; Gastrointestinal: Negative for nausea, vomiting, diarrhea, abdominal pain, blood in stool, hematemesis, jaundice and rectal bleeding. . ; ;  Genitourinary: Negative for dysuria, flank pain and hematuria. ; ; Musculoskeletal: +left calf pain. Negative for back pain and neck pain. Negative for swelling and trauma.; ; Skin: Negative for pruritus, rash, abrasions, blisters, bruising and skin lesion.; ; Neuro: Negative for headache, lightheadedness and neck stiffness. Negative for weakness, altered level of consciousness, altered mental status, extremity weakness, paresthesias, involuntary movement, seizure and syncope.      Physical Exam Updated Vital Signs BP (!) 165/81 (BP Location: Left Arm)   Pulse 64   Temp 98 F (36.7 C) (Oral)   Resp 18   Ht 5\' 7"  (1.702 m)   Wt 67.6 kg (149 lb)   SpO2 98%   BMI 23.34 kg/m   Physical Exam 0720: Physical examination:  Nursing notes reviewed; Vital signs and O2 SAT reviewed;  Constitutional: Well developed, Well nourished, Well hydrated, In no acute distress; Head:  Normocephalic, atraumatic; Eyes: EOMI, PERRL, No scleral icterus; ENMT: Mouth and pharynx normal, Mucous membranes moist; Neck: Supple, Full range of motion, No lymphadenopathy; Cardiovascular: Regular rate and rhythm,  No gallop; Respiratory: Breath sounds clear & equal bilaterally, No wheezes.  Speaking full sentences with ease, Normal respiratory effort/excursion; Chest: Nontender, Movement normal; Abdomen: Soft, Nontender, Nondistended, Normal bowel sounds; Genitourinary: No CVA tenderness; Extremities:  NT left knee/ankle/foot. +left calf tenderness to palp, no edema, no rash. No calf asymmetry. +palp pulses.; Neuro: AA&Ox3, Major CN grossly intact.  Speech clear. No gross focal motor or sensory deficits in extremities.; Skin: Color normal, Warm, Dry.   ED Treatments / Results  Labs (all labs ordered are listed, but only abnormal results are displayed)   EKG  EKG Interpretation None       Radiology   Procedures Procedures (including critical care time)  Medications Ordered in ED Medications  HYDROcodone-acetaminophen (NORCO/VICODIN) 5-325 MG per tablet 1 tablet (1 tablet Oral Given 01/11/17 0743)     Initial Impression / Assessment and Plan / ED Course  I have reviewed the triage vital signs and the nursing notes.  Pertinent labs & imaging results that were available during my care of the patient were reviewed by me and considered in my medical decision making (see chart for details).  MDM Reviewed: previous chart, nursing note and vitals Reviewed previous: labs Interpretation: labs and ultrasound   Results for orders placed or performed during the hospital encounter of 01/11/17  I-stat Chem 8, ED  Result Value Ref Range   Sodium 139 135 - 145 mmol/L   Potassium 3.9 3.5 - 5.1 mmol/L   Chloride 104 101 - 111 mmol/L   BUN 16 6 - 20 mg/dL   Creatinine, Ser 0.90 0.44 - 1.00 mg/dL   Glucose, Bld 114 (H) 65 - 99 mg/dL   Calcium, Ion 1.15 1.15 - 1.40 mmol/L   TCO2 27 22 - 32 mmol/L   Hemoglobin 14.3 12.0 - 15.0 g/dL   HCT 42.0 36.0 - 46.0 %   Dg Lumbar Spine Complete Result Date: 01/11/2017 CLINICAL DATA:  81 year old with acute onset of left lower extremity pain. No known  injuries. EXAM: LUMBAR SPINE - COMPLETE 4+ VIEW COMPARISON:  Bone window images from CT abdomen and pelvis 08/24/2011. Lumbar spine x-rays 09/24/2006. FINDINGS: Five non-rib-bearing lumbar vertebrae with anatomic alignment. No fractures. Mild osseous demineralization. Mild disc space narrowing at L4-5, unchanged since the CT. Remaining disc spaces well-preserved. Bilateral L5 pars defects were better visualized on the prior CT. Facet degenerative changes at L4-5 and L5-S1. Sacroiliac joints intact with mild degenerative changes. Aortoiliac  atherosclerosis without aneurysm. IMPRESSION: 1. No acute osseous abnormality. 2. Mild degenerative disc disease at L4-5. Facet degenerative changes at L4-5 and L5-S1. 3. Bilateral L5 pars defects without slip, better seen on the prior CT from 2013. Electronically Signed   By: Evangeline Dakin M.D.   On: 01/11/2017 09:30   US Venous Img Lower Unilateral Left Result Date: 01/11/2017 CLINICAL DATA:  Acute left calf pain for 1 day EXAM: LEFT LOWER EXTREMITY VENOUS DOPPLER ULTRASOUND TECHNIQUE: Gray-scale sonography with graded compression, as well as color Doppler and duplex ultrasound were performed to evaluate the lower extremity deep venous systems from the level of the common femoral vein and including the common femoral, femoral, profunda femoral, popliteal and calf veins including the posterior tibial, peroneal and gastrocnemius veins when visible. The superficial great saphenous vein was also interrogated. Spectral Doppler was utilized to evaluate flow at rest and with distal augmentation maneuvers in the common femoral, femoral and popliteal veins. COMPARISON:  None. FINDINGS: Contralateral Common Femoral Vein: Respiratory phasicity is normal and symmetric with the symptomatic side. No evidence of thrombus. Normal compressibility. Common Femoral Vein: No evidence of thrombus. Normal compressibility, respiratory phasicity and response to augmentation. Saphenofemoral  Junction: No evidence of thrombus. Normal compressibility and flow on color Doppler imaging. Profunda Femoral Vein: No evidence of thrombus. Normal compressibility and flow on color Doppler imaging. Femoral Vein: No evidence of thrombus. Normal compressibility, respiratory phasicity and response to augmentation. Popliteal Vein: No evidence of thrombus. Normal compressibility, respiratory phasicity and response to augmentation. Calf Veins: No evidence of thrombus. Normal compressibility and flow on color Doppler imaging. Superficial Great Saphenous Vein: No evidence of thrombus. Normal compressibility. Venous Reflux:  None. Other Findings:  None. IMPRESSION: No evidence of deep venous thrombosis. Electronically Signed   By: Jerilynn Mages.  Shick M.D.   On: 01/11/2017 08:20     0945:  Neuro and vascular exams intact, +pedal pulses. Pain located left calf only. Workup is reassuring. Pt has walker at home. Tx symptomatically at this time. Dx and testing d/w pt and family.  Questions answered.  Verb understanding, agreeable to d/c home with outpt f/u.   Final Clinical Impressions(s) / ED Diagnoses   Final diagnoses:  None    ED Discharge Orders    None       Francine Graven, DO 01/14/17 2052

## 2017-01-11 NOTE — Discharge Instructions (Signed)
Take the prescriptions as directed.  Apply moist heat or ice to the area(s) of discomfort, for 15 minutes at a time, several times per day for the next few days.  Do not fall asleep on a heating or ice pack. Walk with your walker until you are seen in follow up. Call your regular medical doctor today to schedule a follow up appointment in the next 3 days.  Return to the Emergency Department immediately if worsening.

## 2017-01-11 NOTE — ED Notes (Signed)
Pt unable to put much weight at all on her

## 2017-01-21 ENCOUNTER — Telehealth: Payer: Self-pay | Admitting: Family Medicine

## 2017-01-21 MED ORDER — METHOCARBAMOL 500 MG PO TABS: 500.0000 mg | ORAL_TABLET | Freq: Three times a day (TID) | ORAL | 1 refills | Status: DC | PRN

## 2017-01-21 NOTE — Telephone Encounter (Signed)
Pt was seen in er for pain in leg - dopplers were normal - but pt is still having some pain especially in the morning and would like a refill on the muscle relaxer if possible. She has an apt to see Dr. Dennard Schaumann 01/28/17. Ok to refill??

## 2017-01-21 NOTE — Telephone Encounter (Signed)
Medication called/sent to requested pharmacy  

## 2017-01-21 NOTE — Telephone Encounter (Signed)
Reviewed her ER note.  She presented with complaint of throbbing pain in the calf.  X-ray lumbar spine was performed as well as a Doppler to rule out DVT.  Can give refill on Robaxin but then needs to come in for office visit to further evaluate/follow-up cause of symptoms.  Send in refill for Robaxin for #30+1.

## 2017-01-22 ENCOUNTER — Ambulatory Visit: Payer: Medicare Other

## 2017-01-28 ENCOUNTER — Ambulatory Visit: Payer: Medicare Other | Admitting: Family Medicine

## 2017-01-28 ENCOUNTER — Ambulatory Visit (HOSPITAL_COMMUNITY)
Admission: RE | Admit: 2017-01-28 | Discharge: 2017-01-28 | Disposition: A | Discharge status: Home / Self Care | Payer: Medicare Other | Source: Ambulatory Visit | Attending: Family Medicine | Admitting: Family Medicine

## 2017-01-28 ENCOUNTER — Encounter: Payer: Self-pay | Admitting: Family Medicine

## 2017-01-28 VITALS — BP 126/84 | HR 68 | Temp 98.1°F | Resp 16 | Ht 67.0 in | Wt 147.0 lb

## 2017-01-28 DIAGNOSIS — M79662 Pain in left lower leg: Secondary | ICD-10-CM

## 2017-01-28 NOTE — Progress Notes (Signed)
Subjective:    Patient ID: Lauren House, female    DOB: [birth year], 81 y.o.   MRN: [id number]  HPI Unfortunately, patient's son was recently diagnosed with metastatic stomach cancer.  He is currently in [place] in the ICU.  Patient went to visit him on November 16 and had a do a fair amount of walking.  She awoke Friday morning unable to put weight on her left leg with a throbbing pain in her left calf.  She went to the emergency room at [facility] where venous ultrasound was negative for a DVT.  X-ray of the lumbar spine revealed mild degenerative disc disease at L4-L5 and also bilateral pars defects.  However patient denies any low back pain nor radiating pain shooting from her posterior left hip down her left leg into her left foot.  The pain is approximately 80% better today.  She continues to complain occasionally of a throbbing pain in her left calf.  She has 2+/4 dorsalis pedis pulse, posterior tibialis pulse, and popliteal pulse on exam.  There is no evidence of claudication.  In fact the throbbing pain is worse when the leg is in a dependent position such as hanging off the bed.  She denies any pain with ambulation.  She does have some mild tenderness palpation of the left calf/gastrocnemius muscle.  Examination of the legs is significant for several varicose veins but no pitting edema and no erythema. Past Medical History:  Diagnosis Date  . Anxiety   . Apical variant hypertrophic cardiomyopathy ([place])   . Arthritis   . Borderline diabetes mellitus   . Bruises easily   . Bursitis of left hip   . Coronary atherosclerosis of native coronary artery    a. 2011: Cath showing nonobstructive CAD - 50-60% LAD (normal FFR) b. 06/2015: Cath showing 80% stenosis in the Mid LAD, tx w/ Promus DES 2.25 mm x 16 mm   . Essential hypertension, benign   . History of kidney stones   . History of skin cancer    Basal cell - abdomen  . Hyperlipidemia    Diet controlled.cannot take meds  . Nocturia   .  Osteoporosis   . Overactive bladder   . Shortness of breath dyspnea   . Vertigo    Past Surgical History:  Procedure Laterality Date  . ABDOMINAL HYSTERECTOMY    . CARDIAC CATHETERIZATION N/A 07/22/2015   Procedure: Left Heart Cath and Coronary Angiography;  Surgeon: Leonie Man, MD;  Location: [place] [facility];  Service: Cardiovascular;  Laterality: N/A;  . CARDIAC CATHETERIZATION N/A 07/22/2015   Procedure: Coronary Stent Intervention;  Surgeon: Leonie Man, MD;  Location: [place] [facility];  Service: Cardiovascular;  Laterality: N/A;  . CORONARY STENT PLACEMENT  07/22/2015   mid lad  des   . EYE SURGERY     cataracts bilateral  . HEMORRHOID SURGERY    . INGUINAL HERNIA REPAIR  10/11/2011   Procedure: HERNIA REPAIR INGUINAL ADULT;  Surgeon: Stark Klein, MD;  Location: [place];  Service: General;  Laterality: Right;  . OPEN SURGICAL REPAIR OF GLUTEAL TENDON Left 03/12/2014   Procedure: LEFT HIP BURSECTOMY AND GLUTEAL TENDON REPAIR;  Surgeon: [name], MD;  Location: [facility];  Service: Orthopedics;  Laterality: Left;   Current Outpatient Medications on File Prior to Visit  Medication Sig Dispense Refill  . aspirin EC 81 MG tablet Take 81 mg by mouth daily.    . Cyanocobalamin (VITAMIN B12 PO) Take  1 tablet by mouth daily.    . fluticasone (FLONASE) 50 MCG/ACT nasal spray USE TWO SPRAY IN EACH NOSTRIL EVERY DAY 50 g 1  . HYDROcodone-acetaminophen (NORCO/VICODIN) 5-325 MG tablet 1 or 2 tabs PO q12 hours prn pain 8 tablet 0  . hydrocortisone cream 1 % Apply 1 application topically 2 (two) times daily. (Patient taking differently: Apply 1 application at bedtime topically. ) 30 g 0  . methocarbamol (ROBAXIN) 500 MG tablet Take 1 tablet (500 mg total) by mouth every 8 (eight) hours as needed for muscle spasms. 30 tablet 1  . metoprolol tartrate (LOPRESSOR) 25 MG tablet TAKE ONE-HALF TABLET BY MOUTH TWICE DAILY 30 tablet 7  . Multiple Vitamins-Minerals (OCUVITE PO) Take 1 tablet  by mouth daily.    . nitroGLYCERIN (NITROSTAT) 0.4 MG SL tablet Place 1 tablet (0.4 mg total) under the tongue every 5 (five) minutes as needed for chest pain. 25 tablet 3  . Polyethyl Glycol-Propyl Glycol (SYSTANE OP) Apply 1 drop to eye 3 (three) times daily as needed (Dry eyes).    . ranolazine (RANEXA) 500 MG 12 hr tablet Take 1 tablet (500 mg total) by mouth 2 (two) times daily. 180 tablet 3   No current facility-administered medications on file prior to visit.    Allergies  Allergen Reactions  . Chlorhexidine Itching  . Vancomycin Hives  . Aspirin Other (See Comments)    Nose bleeds with full strength  . Atorvastatin Other (See Comments)    Muscle pain  . Bactrim [Sulfamethoxazole-Trimethoprim] Other (See Comments)    Doesn't remember   . Cephalosporins Hives and Itching  . Ciprofloxacin Nausea Only  . Clarithromycin Other (See Comments)    Doesn't remember   . Colestipol Hcl Other (See Comments)    Hallucinations  . Ezetimibe Other (See Comments)    GI upset  . Prednisone   . Simvastatin Other (See Comments)    Muscle spasms  . Penicillins Rash    Has patient had a PCN reaction causing immediate rash, facial/tongue/throat swelling, SOB or lightheadedness with hypotension: yes Has patient had a PCN reaction causing severe rash involving mucus membranes or skin necrosis: no Has patient had a PCN reaction that required hospitalization no Has patient had a PCN reaction occurring within the last 10 years: no If all of the above answers are "NO", then may proceed with Cephalosporin use.   . Tape Rash   Social History   Socioeconomic History  . Marital status: Widowed    Spouse name: Not on file  . Number of children: Not on file  . Years of education: Not on file  . Highest education level: Not on file  Social Needs  . Financial resource strain: Not on file  . Food insecurity - worry: Not on file  . Food insecurity - inability: Not on file  . Transportation needs -  medical: Not on file  . Transportation needs - non-medical: Not on file  Occupational History  . Not on file  Tobacco Use  . Smoking status: Never Smoker  . Smokeless tobacco: Never Used  Substance and Sexual Activity  . Alcohol use: No  . Drug use: No  . Sexual activity: No  Other Topics Concern  . Not on file  Social History Narrative  . Not on file      Review of Systems  All other systems reviewed and are negative.      Objective:   Physical Exam  Constitutional: She appears well-developed and well-nourished.  Cardiovascular: Normal rate, regular rhythm, normal heart sounds and intact distal pulses. Exam reveals no gallop and no friction rub.  No murmur heard. Pulses:      Popliteal pulses are 2+ on the right side, and 2+ on the left side.       Dorsalis pedis pulses are 2+ on the right side, and 2+ on the left side.       Posterior tibial pulses are 2+ on the right side, and 2+ on the left side.  Pulmonary/Chest: Effort normal and breath sounds normal. No respiratory distress. She has no wheezes. She has no rales.  Abdominal: Soft. Bowel sounds are normal. She exhibits no distension. There is no tenderness. There is no rebound.  Musculoskeletal: She exhibits no edema.       Left lower leg: She exhibits tenderness. She exhibits no bony tenderness, no swelling, no edema, no deformity and no laceration.  Skin: No rash noted. No erythema.  Vitals reviewed.         Assessment & Plan:  Pain of left calf - Plan: DG Tibia/Fibula Left  DVT has been ruled out by ultrasound at ER.  Differential diagnosis includes muscle strain that is slowly resolving, bone pain from underlying skeletal pathology, arterial claudication, neuropathic pain from lumbar radiculopathy.  History does not support lumbar radiculopathy.  Exam does not support arterial claudication.  I will obtain an x-ray to rule out underlying skeletal pathology.  If x-ray is normal, I would recommend clinical  monitoring over the next 2 weeks to see if pain continues to improve.  If not I would consider arterial Dopplers to evaluate for peripheral vascular disease.  If the patient develops symptoms of lumbar radiculopathy, consider an MRI versus nerve conduction study.  However my opinion is that the pain is most likely muscular and represents a strain after significant walking.

## 2017-03-12 ENCOUNTER — Other Ambulatory Visit: Payer: Self-pay

## 2017-03-12 MED ORDER — METOPROLOL TARTRATE 25 MG PO TABS: 12.5000 mg | ORAL_TABLET | Freq: Two times a day (BID) | ORAL | 7 refills | Status: DC

## 2017-04-02 ENCOUNTER — Ambulatory Visit (INDEPENDENT_AMBULATORY_CARE_PROVIDER_SITE_OTHER): Payer: Medicare Other | Admitting: Family Medicine

## 2017-04-02 ENCOUNTER — Ambulatory Visit (HOSPITAL_COMMUNITY)
Admission: RE | Admit: 2017-04-02 | Discharge: 2017-04-02 | Disposition: A | Discharge status: Home / Self Care | Payer: Medicare Other | Source: Ambulatory Visit | Attending: Family Medicine | Admitting: Family Medicine

## 2017-04-02 ENCOUNTER — Encounter: Payer: Self-pay | Admitting: Family Medicine

## 2017-04-02 VITALS — BP 134/80 | HR 76 | Temp 98.5°F | Resp 14 | Ht 67.0 in | Wt 146.0 lb

## 2017-04-02 DIAGNOSIS — J029 Acute pharyngitis, unspecified: Secondary | ICD-10-CM | POA: Diagnosis not present

## 2017-04-02 DIAGNOSIS — R042 Hemoptysis: Secondary | ICD-10-CM | POA: Diagnosis not present

## 2017-04-02 DIAGNOSIS — R079 Chest pain, unspecified: Secondary | ICD-10-CM | POA: Diagnosis not present

## 2017-04-02 MED ORDER — AZITHROMYCIN 250 MG PO TABS: ORAL_TABLET | ORAL | 0 refills | Status: DC

## 2017-04-02 NOTE — Progress Notes (Signed)
Subjective:    Patient ID: Lauren House, female    DOB: 21-Aug-1935, 82 y.o.   MRN: [id number]  HPI  Patient developed a severe sore throat 2 days ago.  It hurts so bad she can barely swallow.  She denies any food sticking when she swallows.  She denies any trismus.  She also complains of bilateral ear pain.  She reports subjective fevers.  She had one episode of hemoptysis.  When she was coughing, she brought up a green pea-sized amount of blood mixed with sputum.  She denies any shortness of breath.  She denies any chest pain.  She denies any pleurisy. Past Medical History:  Diagnosis Date  . Anxiety   . Apical variant hypertrophic cardiomyopathy ([place])   . Arthritis   . Borderline diabetes mellitus   . Bruises easily   . Bursitis of left hip   . Coronary atherosclerosis of native coronary artery    a. 2011: Cath showing nonobstructive CAD - 50-60% LAD (normal FFR) b. 06/2015: Cath showing 80% stenosis in the Mid LAD, tx w/ Promus DES 2.25 mm x 16 mm   . Essential hypertension, benign   . History of kidney stones   . History of skin cancer    Basal cell - abdomen  . Hyperlipidemia    Diet controlled.cannot take meds  . Nocturia   . Osteoporosis   . Overactive bladder   . Shortness of breath dyspnea   . Vertigo    Past Surgical History:  Procedure Laterality Date  . ABDOMINAL HYSTERECTOMY    . CARDIAC CATHETERIZATION N/A 07/22/2015   Procedure: Left Heart Cath and Coronary Angiography;  Surgeon: Leonie Man, MD;  Location: [facility];  Service: Cardiovascular;  Laterality: N/A;  . CARDIAC CATHETERIZATION N/A 07/22/2015   Procedure: Coronary Stent Intervention;  Surgeon: Leonie Man, MD;  Location: [facility];  Service: Cardiovascular;  Laterality: N/A;  . CORONARY STENT PLACEMENT  07/22/2015   mid lad  des   . EYE SURGERY     cataracts bilateral  . HEMORRHOID SURGERY    . INGUINAL HERNIA REPAIR  10/11/2011   Procedure: HERNIA REPAIR INGUINAL ADULT;   Surgeon: Stark Klein, MD;  Location: [facility];  Service: General;  Laterality: Right;  . OPEN SURGICAL REPAIR OF GLUTEAL TENDON Left 03/12/2014   Procedure: LEFT HIP BURSECTOMY AND GLUTEAL TENDON REPAIR;  Surgeon: [name], MD;  Location: [facility];  Service: Orthopedics;  Laterality: Left;   Current Outpatient Medications on File Prior to Visit  Medication Sig Dispense Refill  . aspirin EC 81 MG tablet Take 81 mg by mouth daily.    . Cyanocobalamin (VITAMIN B12 PO) Take 1 tablet by mouth daily.    . fluticasone (FLONASE) 50 MCG/ACT nasal spray USE TWO SPRAY IN EACH NOSTRIL EVERY DAY 50 g 1  . HYDROcodone-acetaminophen (NORCO/VICODIN) 5-325 MG tablet 1 or 2 tabs PO q12 hours prn pain 8 tablet 0  . hydrocortisone cream 1 % Apply 1 application topically 2 (two) times daily. (Patient taking differently: Apply 1 application at bedtime topically. ) 30 g 0  . methocarbamol (ROBAXIN) 500 MG tablet Take 1 tablet (500 mg total) by mouth every 8 (eight) hours as needed for muscle spasms. 30 tablet 1  . metoprolol tartrate (LOPRESSOR) 25 MG tablet Take 0.5 tablets (12.5 mg total) by mouth 2 (two) times daily. 30 tablet 7  . Multiple Vitamins-Minerals (OCUVITE PO) Take 1 tablet by mouth daily.    Marland Kitchen  nitroGLYCERIN (NITROSTAT) 0.4 MG SL tablet Place 1 tablet (0.4 mg total) under the tongue every 5 (five) minutes as needed for chest pain. 25 tablet 3  . Polyethyl Glycol-Propyl Glycol (SYSTANE OP) Apply 1 drop to eye 3 (three) times daily as needed (Dry eyes).    . ranolazine (RANEXA) 500 MG 12 hr tablet Take 1 tablet (500 mg total) by mouth 2 (two) times daily. 180 tablet 3   No current facility-administered medications on file prior to visit.    Allergies  Allergen Reactions  . Chlorhexidine Itching  . Vancomycin Hives  . Aspirin Other (See Comments)    Nose bleeds with full strength  . Atorvastatin Other (See Comments)    Muscle pain  . Bactrim [Sulfamethoxazole-Trimethoprim] Other (See Comments)     Doesn't remember   . Cephalosporins Hives and Itching  . Ciprofloxacin Nausea Only  . Clarithromycin Other (See Comments)    Doesn't remember   . Colestipol Hcl Other (See Comments)    Hallucinations  . Ezetimibe Other (See Comments)    GI upset  . Prednisone   . Simvastatin Other (See Comments)    Muscle spasms  . Penicillins Rash    Has patient had a PCN reaction causing immediate rash, facial/tongue/throat swelling, SOB or lightheadedness with hypotension: yes Has patient had a PCN reaction causing severe rash involving mucus membranes or skin necrosis: no Has patient had a PCN reaction that required hospitalization no Has patient had a PCN reaction occurring within the last 10 years: no If all of the above answers are "NO", then may proceed with Cephalosporin use.   . Tape Rash   Social History   Socioeconomic History  . Marital status: Widowed    Spouse name: Not on file  . Number of children: Not on file  . Years of education: Not on file  . Highest education level: Not on file  Social Needs  . Financial resource strain: Not on file  . Food insecurity - worry: Not on file  . Food insecurity - inability: Not on file  . Transportation needs - medical: Not on file  . Transportation needs - non-medical: Not on file  Occupational History  . Not on file  Tobacco Use  . Smoking status: Never Smoker  . Smokeless tobacco: Never Used  Substance and Sexual Activity  . Alcohol use: No  . Drug use: No  . Sexual activity: No  Other Topics Concern  . Not on file  Social History Narrative  . Not on file     Review of Systems  All other systems reviewed and are negative.      Objective:   Physical Exam  Constitutional: She appears well-developed and well-nourished. No distress.  HENT:  Right Ear: External ear normal.  Left Ear: External ear normal.  Nose: Nose normal.  Mouth/Throat: Oropharynx is clear and moist. No oropharyngeal exudate.  Cardiovascular: Normal  rate, regular rhythm and normal heart sounds.  Pulmonary/Chest: Effort normal. No respiratory distress. She has no wheezes.  Skin: She is not diaphoretic.  Vitals reviewed.   Exam of the oropharynx is extremely limited due to the patient's severe gag reflex.  I am unable to visualize the uvula or the peritonsillar folds secondary to her tongue and gag reflex.  Whenever I use a tongue depressor to visualize that area, the patient recoils and begins to cough uncontrollably preventing me from visibly seeing the oropharynx.  I was able to perform a strep screen myself although I question  the adequacy of the sample.  Patient does have some very faint bilateral basilar crackles posteriorly that sound consistent with atelectasis    Assessment & Plan:  Acute pharyngitis, unspecified etiology - Plan: STREP GROUP A AG, W/REFLEX TO CULT  Hemoptysis - Plan: DG Chest 2 View  Exam is extremely limited.  However I am concerned by her severe sore throat.  Strep test is negative however I question the accuracy of that sample given how quickly she recoiled for me as soon as I touched the posterior oropharynx.  Therefore I am honestly basing treatment more on symptoms then on direct physical exam findings.  I will treat the patient empirically with a Z-Pak for the sore throat due to her allergy profile.  She has taken a Z-Pak in the past without difficulty.  I would await throat culture results.  I will send the patient for a chest x-ray given her abnormal findings on her physical exam and episode of hemoptysis.  Most likely she has pharyngitis with trace blood due to swelling in the throat mixing with mucus and postnasal drainage that she then expectorated.  However if the hemoptysis persists, she will need a CT scan of the chest, direct laryngoscopy, and possibly bronchoscopy.  We will recheck the patient in 1 week.  She is to call me if she has any further symptoms

## 2017-04-04 LAB — CULTURE, GROUP A STREP
MICRO NUMBER: 90153642
SPECIMEN QUALITY: ADEQUATE

## 2017-04-04 LAB — STREP GROUP A AG, W/REFLEX TO CULT: Streptococcus, Group A Screen (Direct): NOT DETECTED

## 2017-04-10 ENCOUNTER — Ambulatory Visit: Payer: Medicare Other | Admitting: Internal Medicine

## 2017-04-10 ENCOUNTER — Encounter: Payer: Self-pay | Admitting: Internal Medicine

## 2017-04-10 VITALS — BP 140/82 | HR 63 | Ht 67.0 in | Wt 147.6 lb

## 2017-04-10 DIAGNOSIS — R0609 Other forms of dyspnea: Secondary | ICD-10-CM | POA: Diagnosis not present

## 2017-04-10 DIAGNOSIS — I10 Essential (primary) hypertension: Secondary | ICD-10-CM | POA: Diagnosis not present

## 2017-04-10 DIAGNOSIS — I422 Other hypertrophic cardiomyopathy: Secondary | ICD-10-CM

## 2017-04-10 DIAGNOSIS — I2583 Coronary atherosclerosis due to lipid rich plaque: Secondary | ICD-10-CM

## 2017-04-10 DIAGNOSIS — I2510 Atherosclerotic heart disease of native coronary artery without angina pectoris: Secondary | ICD-10-CM | POA: Diagnosis not present

## 2017-04-10 DIAGNOSIS — R0600 Dyspnea, unspecified: Secondary | ICD-10-CM

## 2017-04-10 MED ORDER — METOPROLOL TARTRATE 25 MG PO TABS: 12.5000 mg | ORAL_TABLET | Freq: Two times a day (BID) | ORAL | 1 refills | Status: DC

## 2017-04-10 NOTE — Progress Notes (Signed)
OFFICE NOTE  Chief Complaint:  Raspy voice  Primary Care Physician: Susy Frizzle, MD  HPI:  Lauren House is a pleasant 82 year old female I have been following for history of apical hypertrophic cardiomyopathy for which she has done well on Ranexa 500 mg daily. Recently, she was having right inguinal pain and had a hernia for which she had repair. That, unfortunately, she says has caused her more harm than good and she continues to have more pain in her right hip area which possibly is related to the hip. She saw Dr. Wynelle Link at [facility] for further evaluation from that and has received 2 hip injections which have been beneficial. He did mention that he felt she might need a hip replacement. Recently she had a recurrent episode of chest pain. This caused her to the hospital and improved with nitroglycerin. She was ruled out for MI and discharged. Unfortunately she returned several days later after she developed swelling, warmth and pain in her left forearm. It was felt that she might have possibly had an infected IV site and clearly had superficial thrombophlebitis. She was placed on antibiotics for this and is being treated with some improvement in her symptoms. After her emergency room visit, it was recommended that she increase her Ranexa 500 mg twice daily which is the recommended starting dose. She's had no further chest pain episodes. She had prior cardiac catheterization in 2011 which demonstrated a 50-60% LAD lesion. She underwent FFR for this which was negative with an FFR of 0.99. She had had a stress test at that time which indicated no evidence of reversible ischemia., but was having persistent chest pain.  It is been felt that her chest pain was due to small vessel disease in the setting of significant apical variant hypertrophic cardiomyopathy. This has been treated most effectively with ranolazine, and nitrates to some extent. Although her symptoms now have  escalated. This is concerning to me for possible worsening of her LAD disease.  Lauren House underwent a nuclear stress test on 02/03/2013. This was negative for ischemia with an EF of 52%. Since then she has reported some improvement in her symptoms. Her main complaints now have to do with left hip pain.  She is still contemplating surgery. She reported that her husband was recently diagnosed with esophageal cancer and it seems to be quite advanced. There did not appear to be any surgical options. This has caused her significant anxiety but she reports no chest pain.  Lauren House returns today for follow-up. Unfortunately she mentioned that her husband died about 3 months ago. He was diagnosed with esophageal cancer which was aggressive and inoperable. She is still grieving which is to be expected. She continues to have problems with her hip and at this point apparently will need hip surgery, but probably not hip replacement. That is scheduled about one month from now. She does have a history of moderate LAD stenosis in 2011 with a negative stress test one year ago. Recently, she has had worsening shortness of breath and chest pressure with exertion. She initially felt this was due to stress however her symptoms have been somewhat progressive and concern me for ischemia. Her EKG shows stable T-wave inversions consistent with her hypertrophic cardiomyopathy.  I saw Ms. House back in the office today. Overall she feels like she is doing well. She did have some mild chest discomfort last week and took a nitroglycerin. This is when she was in [place] with her son  and daughter-in-law and they were walking all over town. She managed to walk for several hours without any significant shortness of breath but did have some mild chest discomfort which was relieved fairly quickly with nitroglycerin. Overall she is doing well. She had her hip replacement on the left side which she is recovering from. She still has discomfort  when sitting in a car for long periods of time. She tells me that she's given a be going up to [place] fairly shortly on a vacation and that they will be going up to [facility] for the first time.'  Lauren House returns today for follow-up. She tells me on Saturday night she was awakened early in the morning with palpitations and a warm feeling as well as heaviness in her chest. She says it was quite intense. She denies any vivid dreams prior to that. She's not had any further episodes of chest pain since that or recurrent palpitations. She took a nitroglycerin and the symptoms resolved fairly promptly. She was able to go back to bed. Her EKGs is unchanged showing the T-wave inversions typical of apical variant hypertrophic cardiomyopathy. Her last stress test was in 2014 it was negative for ischemia. She does have a known 50-60% LAD stenosis which was negative by FFR.   07/12/2015  Lauren House was seen today in the office for follow-up. Recently she went to vacation with her family and they noticed more progressive shortness of breath and chest pressure. This was keeping her from doing certain activities. She says her symptoms of gotten worse since her last office visit in December. At that time I performed a nuclear stress test which showed a low normal EF 51% but no reversible ischemia. She does have known 50-60% LAD stenosis which was negative by FFR by catheterization in 2012.  08/26/2015  Lauren House returns today for follow-up. She underwent cardiac catheterization for progressive chest pain concerning for unstable angina on 07/22/2015 by Dr. Ellyn Hack, this demonstrated the following:  1. Mid LAD lesion, 80% stenosed. Post intervention with Promus DES 2.25 mm x 16 mm (2.4 mm), there is a 0% residual stenosis. 2. Ost LAD lesion, 50% stenosed. stable from original catheterization 3. The left ventricular systolic function is normal. with minimal apical filling consistent with known apical  hypertrophy. 4. Otherwise mild to moderate disease in the circumflex and RCA system  Since her coronary angiography and stent placement, she reports she is doing fairly well. She started back doing some exercise and push mowed her lawn the other day. Unfortunate she developed a cracked tooth and she's been having problems with pain. She took antibiotics including doxycycline which causes her stomach upset. She has a temporary crown and will be going back for some more dental work. On questioning today she reports that she has discontinued her Plavix. She said she's been off of it for about one week because of spontaneous bruising. She was not counseled to discontinue this and I reiterated the fact today that being off of dual antiplatelet therapy could put her at risk for acute in-stent thrombosis which could lead to massive heart attack.  11/18/2015  Lauren House was seen today in follow-up. She has been undergoing cardiac rehabilitation. She restart her Plavix which is very important given her recent stent in May of this past year. She denies any chest pain but still gets short of breath. She says is fairly stable but not necessarily getting worse. She is now gone through 8 sessions rehabilitation and is up  to 3.4 metabolic equivalents. Blood pressures been fairly well-controlled.  05/22/2016  Lauren House returns today for follow-up. When I last saw her she was undergoing cardiac rehabilitation and reported shortness of breath with exertion. This was shortly after her stenting I recommended continued cardiac rehabilitation. She says that after finishing rehabilitation she has not had any improvement in her shortness of breath and perhaps it may be worse. She notes it when walking up and down her stairs to her basement. She says after the second trip up the stairs she does get short of breath. She is under a lot of stress and there are some family issues which required her grandson to move in with her. She  denies any chest pain. Blood pressure was elevated today 161/85. She says it is always up in the office, however was 140/80 last time.  06/26/2016  Lauren House was seen today in follow-up. Her echocardiogram shows preserved systolic function, mild diastolic dysfunction and elevated LV filling pressure. We understand that she would have diastolic dysfunction secondary to apical hypertrophic cardiomyopathy. She continues to have shortness of breath with exertion. Her BNP is very low less than 100 and stable. I suspect her symptoms may be due to diastolic dysfunction elevated LVEDP. Blood pressure was over 791 systolic today. In addition, recent lipid profile indicated 2 cholesterol 279, triglycerides 216, HDL 48 and LDL-C1 88. Given her coronary artery disease her goal LDL-C is less than 70. She has been intolerant to numerous statins including rosuvastatin, atorvastatin and pravastatin. She is close to one year since her drug-eluting stent in the LAD.  01/01/2017  Lauren House returns today for follow-up.  General she seems to be doing pretty well.  In May we referred her for evaluation to start a PC SK 9 inhibitor.  She had a discussion about starting Repatha however was concerned about cost since she did not have drug coverage.  She is also concerned about not taking the medicine.  She says she ultimately wants to be with her deceased husband.  That being said, she is willing to entertain the medication after I told her today that the cost of come down significantly.  Her cholesterol was recently assessed and LDL remains over 200 untreated.  She has been intolerant to statins as previously outlined in my notes.  Also mentions she has had shingles and 2 bouts of pneumonia since I last saw her.  04/10/2017  Lauren House was seen today in follow-up.  Recently she was seen by primary care provider for bronchitis/pharyngitis.  She was treated with azithromycin.  She still has some scratchiness in her throat but is  improving.  She reports she does get short of breath with exertion, worse than it was a couple years ago but generally has been stable.  She denies any angina.  She is thought about the injectable PCS K9 inhibitors, but declines it at this time.  Unfortunately, her son recently died after being found to have cancer.  PMHx:  Past Medical History:  Diagnosis Date  . Anxiety   . Apical variant hypertrophic cardiomyopathy ([place])   . Arthritis   . Borderline diabetes mellitus   . Bruises easily   . Bursitis of left hip   . Coronary atherosclerosis of native coronary artery    a. 2011: Cath showing nonobstructive CAD - 50-60% LAD (normal FFR) b. 06/2015: Cath showing 80% stenosis in the Mid LAD, tx w/ Promus DES 2.25 mm x 16 mm   . Essential hypertension, benign   .  History of kidney stones   . History of skin cancer    Basal cell - abdomen  . Hyperlipidemia    Diet controlled.cannot take meds  . Nocturia   . Osteoporosis   . Overactive bladder   . Shortness of breath dyspnea   . Vertigo     Past Surgical History:  Procedure Laterality Date  . ABDOMINAL HYSTERECTOMY    . CARDIAC CATHETERIZATION N/A 07/22/2015   Procedure: Left Heart Cath and Coronary Angiography;  Surgeon: Leonie Man, MD;  Location: [facility];  Service: Cardiovascular;  Laterality: N/A;  . CARDIAC CATHETERIZATION N/A 07/22/2015   Procedure: Coronary Stent Intervention;  Surgeon: Leonie Man, MD;  Location: [facility];  Service: Cardiovascular;  Laterality: N/A;  . CORONARY STENT PLACEMENT  07/22/2015   mid lad  des   . EYE SURGERY     cataracts bilateral  . HEMORRHOID SURGERY    . INGUINAL HERNIA REPAIR  10/11/2011   Procedure: HERNIA REPAIR INGUINAL ADULT;  Surgeon: Stark Klein, MD;  Location: [facility];  Service: General;  Laterality: Right;  . OPEN SURGICAL REPAIR OF GLUTEAL TENDON Left 03/12/2014   Procedure: LEFT HIP BURSECTOMY AND GLUTEAL TENDON REPAIR;  Surgeon: [name], MD;   Location: [facility];  Service: Orthopedics;  Laterality: Left;    FAMHx:  Family History  Problem Relation Age of Onset  . Heart disease Father   . Lung cancer Sister   . Lung cancer Brother     SOCHx:   reports that  has never smoked. she has never used smokeless tobacco. She reports that she does not drink alcohol or use drugs.  ALLERGIES:  Allergies  Allergen Reactions  . Chlorhexidine Itching  . Vancomycin Hives  . Aspirin Other (See Comments)    Nose bleeds with full strength  . Atorvastatin Other (See Comments)    Muscle pain  . Bactrim [Sulfamethoxazole-Trimethoprim] Other (See Comments)    Doesn't remember   . Cephalosporins Hives and Itching  . Ciprofloxacin Nausea Only  . Clarithromycin Other (See Comments)    Doesn't remember   . Colestipol Hcl Other (See Comments)    Hallucinations  . Ezetimibe Other (See Comments)    GI upset  . Prednisone   . Simvastatin Other (See Comments)    Muscle spasms  . Penicillins Rash    Has patient had a PCN reaction causing immediate rash, facial/tongue/throat swelling, SOB or lightheadedness with hypotension: yes Has patient had a PCN reaction causing severe rash involving mucus membranes or skin necrosis: no Has patient had a PCN reaction that required hospitalization no Has patient had a PCN reaction occurring within the last 10 years: no If all of the above answers are "NO", then may proceed with Cephalosporin use.   . Tape Rash    ROS: Pertinent items noted in HPI and remainder of comprehensive ROS otherwise negative.  HOME MEDS: Current Outpatient Medications  Medication Sig Dispense Refill  . aspirin EC 81 MG tablet Take 81 mg by mouth daily.    . Cyanocobalamin (VITAMIN B12 PO) Take 1 tablet by mouth daily.    . fluticasone (FLONASE) 50 MCG/ACT nasal spray USE TWO SPRAY IN EACH NOSTRIL EVERY DAY 50 g 1  . HYDROcodone-acetaminophen (NORCO/VICODIN) 5-325 MG tablet 1 or 2 tabs PO q12 hours prn pain 8 tablet 0  .  hydrocortisone cream 1 % Apply 1 application topically 2 (two) times daily. (Patient taking differently: Apply 1 application at bedtime topically. )  30 g 0  . metoprolol tartrate (LOPRESSOR) 25 MG tablet Take 0.5 tablets (12.5 mg total) by mouth 2 (two) times daily. 30 tablet 7  . Multiple Vitamins-Minerals (OCUVITE PO) Take 1 tablet by mouth daily.    . nitroGLYCERIN (NITROSTAT) 0.4 MG SL tablet Place 1 tablet (0.4 mg total) under the tongue every 5 (five) minutes as needed for chest pain. 25 tablet 3  . Polyethyl Glycol-Propyl Glycol (SYSTANE OP) Apply 1 drop to eye 3 (three) times daily as needed (Dry eyes).    . ranolazine (RANEXA) 500 MG 12 hr tablet Take 1 tablet (500 mg total) by mouth 2 (two) times daily. 180 tablet 3   No current facility-administered medications for this visit.     LABS/IMAGING: No results found for this or any previous visit (from the past 48 hour(s)). No results found.  VITALS: BP 140/82   Pulse 63   Ht 5\' 7"  (1.702 m)   Wt 147 lb 9.6 oz (67 kg)   BMI 23.12 kg/m   EXAM: General appearance: alert and no distress Neck: no carotid bruit, no JVD and thyroid not enlarged, symmetric, no tenderness/mass/nodules Lungs: clear to auscultation bilaterally Heart: regular rate and rhythm, S1, S2 normal, no murmur, click, rub or gallop Abdomen: soft, non-tender; bowel sounds normal; no masses,  no organomegaly Extremities: extremities normal, atraumatic, no cyanosis or edema Pulses: 2+ and symmetric Skin: Skin color, texture, turgor normal. No rashes or lesions Neurologic: Grossly normal Psych: Pleasant  EKG: Sinus rhythm at 63, voltage criteria for LVH, anterolateral T wave inversions suggestive of apical variant hypertrophic cardiomyopathy-personally reviewed  ASSESSMENT: 1. History of DOE - echo with normal systolic function, mild diastolic dysfunction and elevated LVEDP  2. Unstable angina - status post PCI with a Promus DES (2.25 mm x 16 mm to the mid LAD)-  known 50-60% ostial LAD stenosis (FFR 0.99 in 2011) which was stable at repeat cath (06/2015) 3. Apical variant hypertrophic cardimyopathy - anterolateral TWI's - negative nuclear stress test 01-2015 4. Hypertension 5. Venous insufficiency 6. Right inguinal pain with a hernia, status post surgical repair 7. Right hip pain with degenerative hip disease - s/p surgery 8. Statin intolerance with high LDL  PLAN: 1.   Lauren House seems to be doing well.  She had an upper respiratory infection and probable viral pharyngitis.  She was treated with antibiotics.  She denies any new chest pain.  She will need a renewal of her Ranexa and will provide paperwork for that.  Her dyspnea seems to be stable.  She is not interested in [facility] SK 9 inhibitors for additional treatment of her cholesterol.  Follow-up with me in 6 months.  [name], MD, [facility], [place] Director of the Advanced Lipid Disorders &  Cardiovascular Risk Reduction Clinic Diplomate of the American Board of Clinical Lipidology Attending Cardiologist  Direct Dial: [phone]  Fax: [phone]  Website:  [email or web address] 04/10/2017, 9:21 AM

## 2017-04-10 NOTE — Patient Instructions (Signed)
Medication Instructions:  Your physician recommends that you continue on your current medications as directed. Please refer to the Current Medication list given to you today.  Labwork: NONE   Testing/Procedures: NONE   Follow-Up: Your physician wants you to follow-up in: 6 months with DR HILTY. You will receive a reminder letter in the mail two months in advance. If you don't receive a letter, please call our office to schedule the follow-up appointment.  Any Other Special Instructions Will Be Listed Below (If Applicable). If you need a refill on your cardiac medications before your next appointment, please call your pharmacy.

## 2017-05-01 ENCOUNTER — Telehealth: Payer: Self-pay | Admitting: Internal Medicine

## 2017-05-01 NOTE — Telephone Encounter (Signed)
Patient called. She prefers to have the application mailed to her so she can complete and her daughter will fax in. Advised will place in mail today.   ranexa connect phone: [phone] ranexa connect fax: [phone]

## 2017-05-10 NOTE — Telephone Encounter (Signed)
Faxed additional information for Ranexa Connect patient assistance to [phone] - Rx, med list, allergy list

## 2017-05-10 NOTE — Telephone Encounter (Signed)
Received fax notification from [facility] that patient has been enrolled thru their program until 02/25/18 and will receive the medication thru [facility].

## 2017-07-02 ENCOUNTER — Ambulatory Visit: Payer: Medicare Other | Admitting: Internal Medicine

## 2017-07-09 DIAGNOSIS — M1711 Unilateral primary osteoarthritis, right knee: Secondary | ICD-10-CM | POA: Diagnosis not present

## 2017-07-09 DIAGNOSIS — E119 Type 2 diabetes mellitus without complications: Secondary | ICD-10-CM | POA: Insufficient documentation

## 2017-07-15 DIAGNOSIS — M25561 Pain in right knee: Secondary | ICD-10-CM | POA: Diagnosis not present

## 2017-08-01 DIAGNOSIS — M1711 Unilateral primary osteoarthritis, right knee: Secondary | ICD-10-CM | POA: Diagnosis not present

## 2017-10-08 ENCOUNTER — Other Ambulatory Visit: Payer: Self-pay | Admitting: Family Medicine

## 2017-10-08 DIAGNOSIS — Z1231 Encounter for screening mammogram for malignant neoplasm of breast: Secondary | ICD-10-CM

## 2017-11-18 ENCOUNTER — Ambulatory Visit (HOSPITAL_COMMUNITY): Payer: Medicare Other

## 2017-11-22 ENCOUNTER — Ambulatory Visit (HOSPITAL_COMMUNITY)
Admission: RE | Admit: 2017-11-22 | Discharge: 2017-11-22 | Disposition: A | Discharge status: Home / Self Care | Payer: Medicare Other | Source: Ambulatory Visit | Attending: Family Medicine | Admitting: Family Medicine

## 2017-11-22 DIAGNOSIS — Z1231 Encounter for screening mammogram for malignant neoplasm of breast: Secondary | ICD-10-CM | POA: Insufficient documentation

## 2017-12-03 ENCOUNTER — Other Ambulatory Visit: Payer: Self-pay

## 2017-12-03 ENCOUNTER — Ambulatory Visit (INDEPENDENT_AMBULATORY_CARE_PROVIDER_SITE_OTHER): Payer: Medicare Other | Admitting: Family Medicine

## 2017-12-03 ENCOUNTER — Encounter: Payer: Self-pay | Admitting: Family Medicine

## 2017-12-03 ENCOUNTER — Other Ambulatory Visit: Payer: Self-pay | Admitting: Family Medicine

## 2017-12-03 VITALS — BP 134/70 | HR 86 | Temp 97.9°F | Resp 16 | Ht 67.0 in | Wt 150.0 lb

## 2017-12-03 DIAGNOSIS — N63 Unspecified lump in unspecified breast: Secondary | ICD-10-CM

## 2017-12-03 DIAGNOSIS — Z23 Encounter for immunization: Secondary | ICD-10-CM | POA: Diagnosis not present

## 2017-12-03 NOTE — Progress Notes (Signed)
Patient ID: Lauren House, female    DOB: Jan 24, 1936, 82 y.o.   MRN: [id number]  PCP: Susy Frizzle, MD  Chief Complaint  Patient presents with  . Breast exam    states that she has tenderness and pain to L breast at the 5:30- 6 o'clock area- states that she has had mammogram 3 weeks prior    Subjective:   Lauren House is a 82 y.o. female, presents to clinic with CC of left breast lump she noticed last week to still sore today, it was a little red, concerned it may have been shingles.  There is no rash to the area, but the bump is very tender, described as sore, unchanged since noticed it.  No noted swelling of the skin no drainage, no swollen lymph nodes around the area.  Recently had a mammogram roughly a week and a half ago that was negative.   Patient Active Problem List   Diagnosis Date Noted  . Coronary artery disease due to lipid rich plaque 01/01/2017  . CAD S/P percutaneous coronary angioplasty 07/22/2015  . DOE (dyspnea on exertion)   . Unstable angina ([place]) 07/12/2015  . Heart palpitations 02/14/2015  . Hip bursitis 03/13/2014  . Trochanteric bursitis of left hip 03/12/2014  . Exertional chest pain 02/10/2014  . Preoperative cardiovascular examination 02/10/2014  . Angina pectoris, variant ([place]) 12/24/2012  . Left hip pain 12/16/2012  . Bursitis of left hip 12/16/2012  . Osteoarthritis, hip, bilateral 12/16/2012  . Chest pain 08/15/2012  . Apical variant hypertrophic cardiomyopathy ([place]) 08/15/2012  . Bilateral inguinal hernia, right symptomatic 09/21/2011  . COMEDO 10/18/2008  . POSTMENOPAUSAL OSTEOPOROSIS 09/01/2008  . HIP PAIN, LEFT 01/19/2008  . CARCINOMA, BASAL CELL, SKIN 10/02/2006  . VARICOSE VEIN 09/24/2006  . ACTINIC KERATOSIS 09/24/2006  . NECK PAIN 09/24/2006  . BACK PAIN 09/24/2006  . NEOP, BNG, SCALP/SKIN, NECK 04/16/2006  . FIBROIDS, UTERUS 04/16/2006  . Mixed hyperlipidemia 04/16/2006  . MACULAR DEGENERATION 04/16/2006  . Essential  hypertension 04/16/2006  . ALLERGIC RHINITIS 04/16/2006  . OVERACTIVE BLADDER 04/16/2006  . URINARY INCONTINENCE 04/16/2006  . SKIN CANCER, HX OF 04/16/2006  . VERTIGO, HX OF 04/16/2006  . RENAL CALCULUS, HX OF 04/16/2006     Prior to Admission medications   Medication Sig Start Date End Date Taking? Authorizing Provider  aspirin EC 81 MG tablet Take 81 mg by mouth daily.   Yes [provider]  Cyanocobalamin (VITAMIN B12 PO) Take 1 tablet by mouth daily.   Yes [provider]  fluticasone (FLONASE) 50 MCG/ACT nasal spray USE TWO SPRAY IN EACH NOSTRIL EVERY DAY 11/01/15  Yes Susy Frizzle, MD  HYDROcodone-acetaminophen (NORCO/VICODIN) 5-325 MG tablet 1 or 2 tabs PO q12 hours prn pain 01/11/17  Yes Francine Graven, DO  hydrocortisone cream 1 % Apply 1 application topically 2 (two) times daily. Patient taking differently: Apply 1 application at bedtime topically.  08/31/16  Yes Anton, Modena Nunnery, MD  metoprolol tartrate (LOPRESSOR) 25 MG tablet Take 0.5 tablets (12.5 mg total) by mouth 2 (two) times daily. 04/10/17  Yes Hilty, Nadean Corwin, MD  Multiple Vitamins-Minerals (OCUVITE PO) Take 1 tablet by mouth daily.   Yes [provider]  nitroGLYCERIN (NITROSTAT) 0.4 MG SL tablet Place 1 tablet (0.4 mg total) under the tongue every 5 (five) minutes as needed for chest pain. 08/26/14  Yes Hilty, Nadean Corwin, MD  Polyethyl Glycol-Propyl Glycol (SYSTANE OP) Apply 1 drop to eye 3 (three) times daily  as needed (Dry eyes).   Yes [provider]  ranolazine (RANEXA) 500 MG 12 hr tablet Take 1 tablet (500 mg total) by mouth 2 (two) times daily. 05/31/16  Yes Hilty, Nadean Corwin, MD     Allergies  Allergen Reactions  . Chlorhexidine Itching  . Vancomycin Hives  . Aspirin Other (See Comments)    Nose bleeds with full strength  . Atorvastatin Other (See Comments)    Muscle pain  . Bactrim [Sulfamethoxazole-Trimethoprim] Other (See Comments)    Doesn't remember   .  Cephalosporins Hives and Itching  . Ciprofloxacin Nausea Only  . Clarithromycin Other (See Comments)    Doesn't remember   . Colestipol Hcl Other (See Comments)    Hallucinations  . Ezetimibe Other (See Comments)    GI upset  . Prednisone   . Simvastatin Other (See Comments)    Muscle spasms  . Penicillins Rash    Has patient had a PCN reaction causing immediate rash, facial/tongue/throat swelling, SOB or lightheadedness with hypotension: yes Has patient had a PCN reaction causing severe rash involving mucus membranes or skin necrosis: no Has patient had a PCN reaction that required hospitalization no Has patient had a PCN reaction occurring within the last 10 years: no If all of the above answers are "NO", then may proceed with Cephalosporin use.   . Tape Rash     Family History  Problem Relation Age of Onset  . Heart disease Father   . Lung cancer Sister   . Lung cancer Brother      Social History   Socioeconomic History  . Marital status: Widowed    Spouse name: Not on file  . Number of children: Not on file  . Years of education: Not on file  . Highest education level: Not on file  Occupational History  . Not on file  Social Needs  . Financial resource strain: Not on file  . Food insecurity:    Worry: Not on file    Inability: Not on file  . Transportation needs:    Medical: Not on file    Non-medical: Not on file  Tobacco Use  . Smoking status: Never Smoker  . Smokeless tobacco: Never Used  Substance and Sexual Activity  . Alcohol use: No  . Drug use: No  . Sexual activity: Never  Lifestyle  . Physical activity:    Days per week: Not on file    Minutes per session: Not on file  . Stress: Not on file  Relationships  . Social connections:    Talks on phone: Not on file    Gets together: Not on file    Attends religious service: Not on file    Active member of club or organization: Not on file    Attends meetings of clubs or organizations: Not on  file    Relationship status: Not on file  . Intimate partner violence:    Fear of current or ex partner: Not on file    Emotionally abused: Not on file    Physically abused: Not on file    Forced sexual activity: Not on file  Other Topics Concern  . Not on file  Social History Narrative  . Not on file     Review of Systems  Constitutional: Negative.   HENT: Negative.   Eyes: Negative.   Respiratory: Negative.   Cardiovascular: Negative.   Gastrointestinal: Negative.   Endocrine: Negative.   Genitourinary: Negative.   Musculoskeletal: Negative.  Skin: Negative.   Allergic/Immunologic: Negative.   Neurological: Negative.   Hematological: Negative.   Psychiatric/Behavioral: Negative.   All other systems reviewed and are negative.      Objective:    Vitals:   12/03/17 1109  BP: 134/70  Pulse: 86  Resp: 16  Temp: 97.9 F (36.6 C)  TempSrc: Oral  SpO2: 98%  Weight: 150 lb (68 kg)  Height: 5\' 7"  (1.702 m)      Physical Exam  Constitutional: She appears well-developed and well-nourished. No distress.  HENT:  Head: Normocephalic and atraumatic.  Nose: Nose normal.  Eyes: Conjunctivae are normal. Right eye exhibits no discharge. Left eye exhibits no discharge.  Neck: No tracheal deviation present.  Cardiovascular: Normal rate and regular rhythm.  Pulmonary/Chest: Effort normal. No stridor. No respiratory distress. She exhibits no bony tenderness, no crepitus, no edema and no swelling. Right breast exhibits no inverted nipple, no mass, no nipple discharge, no skin change and no tenderness. Left breast exhibits mass and tenderness. Left breast exhibits no inverted nipple, no nipple discharge and no skin change.  1.5 x 1 cm nodule palpated at - o'clock position relative to left nipple, nodule non-mobile, slightly firm, ttp, skin overlying mildly erythematous w/o noted skin edema or induration.  No nipple abnormality, associated breast skin changes or puckering.  No  breast or left axillary lymphadenopathy palpated    Musculoskeletal: Normal range of motion.  Neurological: She is alert. She exhibits normal muscle tone. Coordination normal.  Skin: Skin is warm and dry. No rash noted. She is not diaphoretic.  Psychiatric: She has a normal mood and affect. Her behavior is normal.  Nursing note and vitals reviewed.         Assessment & Plan:      ICD-10-CM   1. Breast nodule N63.0 US BREAST COMPLETE UNI LEFT INC AXILLA  2. Encounter for immunization Z23 Flu vaccine HIGH DOSE PF    Tender nodule/lump or mass to left breast that is tender, onset 1 week ago, and just 2 weeks ago patient did not have any abnormality and had a normal mammogram.  I do suspect possible cystic structure or abscess however feels slightly deep and do not feel that can safely treat without some imaging obtained.  It attempted to obtain a simple ultrasound of the tissues there to assess whether or not it is solid or cystic, did plan to would like to refer to a surgeon for I&D or treatment.  In trying to arrange ultrasound they did advise that she needed adjunct mammography which I am concerned the patient will not tolerate, but unfortunately I have no in office point-of-care ultrasound available.    Treatment and plan will depend on imaging results  Delsa Grana, PA-C 12/03/17 11:26 AM

## 2017-12-03 NOTE — Patient Instructions (Signed)
Will call you to arrange the ultrasound for the breast nodule

## 2017-12-05 DIAGNOSIS — H26493 Other secondary cataract, bilateral: Secondary | ICD-10-CM | POA: Diagnosis not present

## 2017-12-05 DIAGNOSIS — H353132 Nonexudative age-related macular degeneration, bilateral, intermediate dry stage: Secondary | ICD-10-CM | POA: Diagnosis not present

## 2017-12-17 ENCOUNTER — Ambulatory Visit (HOSPITAL_COMMUNITY)
Admission: RE | Admit: 2017-12-17 | Discharge: 2017-12-17 | Disposition: A | Discharge status: Home / Self Care | Payer: Medicare Other | Source: Ambulatory Visit | Attending: Family Medicine | Admitting: Family Medicine

## 2017-12-17 DIAGNOSIS — N6323 Unspecified lump in the left breast, lower outer quadrant: Secondary | ICD-10-CM | POA: Insufficient documentation

## 2017-12-17 DIAGNOSIS — N63 Unspecified lump in unspecified breast: Secondary | ICD-10-CM | POA: Diagnosis present

## 2017-12-17 DIAGNOSIS — R922 Inconclusive mammogram: Secondary | ICD-10-CM | POA: Diagnosis not present

## 2017-12-17 NOTE — Addendum Note (Signed)
Addended by: Delsa Grana on: 12/17/2017 01:21 PM   Modules accepted: Orders

## 2017-12-18 ENCOUNTER — Encounter: Payer: Self-pay | Admitting: Internal Medicine

## 2017-12-18 ENCOUNTER — Ambulatory Visit: Payer: Medicare Other | Admitting: Internal Medicine

## 2017-12-18 VITALS — BP 124/70 | HR 68 | Ht 67.0 in | Wt 152.0 lb

## 2017-12-18 DIAGNOSIS — I2510 Atherosclerotic heart disease of native coronary artery without angina pectoris: Secondary | ICD-10-CM | POA: Diagnosis not present

## 2017-12-18 DIAGNOSIS — I422 Other hypertrophic cardiomyopathy: Secondary | ICD-10-CM | POA: Diagnosis not present

## 2017-12-18 DIAGNOSIS — I2583 Coronary atherosclerosis due to lipid rich plaque: Secondary | ICD-10-CM | POA: Diagnosis not present

## 2017-12-18 DIAGNOSIS — I10 Essential (primary) hypertension: Secondary | ICD-10-CM | POA: Diagnosis not present

## 2017-12-18 NOTE — Patient Instructions (Signed)

## 2017-12-18 NOTE — Progress Notes (Signed)
OFFICE NOTE  Chief Complaint:  No significant complaints  Primary Care Physician: Susy Frizzle, MD  HPI:  Lauren House is a pleasant 82 year old female I have been following for history of apical hypertrophic cardiomyopathy for which she has done well on Ranexa 500 mg daily. Recently, she was having right inguinal pain and had a hernia for which she had repair. That, unfortunately, she says has caused her more harm than good and she continues to have more pain in her right hip area which possibly is related to the hip. She saw Dr. Wynelle Link at [facility] for further evaluation from that and has received 2 hip injections which have been beneficial. He did mention that he felt she might need a hip replacement. Recently she had a recurrent episode of chest pain. This caused her to the hospital and improved with nitroglycerin. She was ruled out for MI and discharged. Unfortunately she returned several days later after she developed swelling, warmth and pain in her left forearm. It was felt that she might have possibly had an infected IV site and clearly had superficial thrombophlebitis. She was placed on antibiotics for this and is being treated with some improvement in her symptoms. After her emergency room visit, it was recommended that she increase her Ranexa 500 mg twice daily which is the recommended starting dose. She's had no further chest pain episodes. She had prior cardiac catheterization in 2011 which demonstrated a 50-60% LAD lesion. She underwent FFR for this which was negative with an FFR of 0.99. She had had a stress test at that time which indicated no evidence of reversible ischemia., but was having persistent chest pain.  It is been felt that her chest pain was due to small vessel disease in the setting of significant apical variant hypertrophic cardiomyopathy. This has been treated most effectively with ranolazine, and nitrates to some extent. Although her symptoms now  have escalated. This is concerning to me for possible worsening of her LAD disease.  Lauren House underwent a nuclear stress test on 02/03/2013. This was negative for ischemia with an EF of 52%. Since then she has reported some improvement in her symptoms. Her main complaints now have to do with left hip pain.  She is still contemplating surgery. She reported that her husband was recently diagnosed with esophageal cancer and it seems to be quite advanced. There did not appear to be any surgical options. This has caused her significant anxiety but she reports no chest pain.  Rether returns today for follow-up. Unfortunately she mentioned that her husband died about 3 months ago. He was diagnosed with esophageal cancer which was aggressive and inoperable. She is still grieving which is to be expected. She continues to have problems with her hip and at this point apparently will need hip surgery, but probably not hip replacement. That is scheduled about one month from now. She does have a history of moderate LAD stenosis in 2011 with a negative stress test one year ago. Recently, she has had worsening shortness of breath and chest pressure with exertion. She initially felt this was due to stress however her symptoms have been somewhat progressive and concern me for ischemia. Her EKG shows stable T-wave inversions consistent with her hypertrophic cardiomyopathy.  I saw Lauren House back in the office today. Overall she feels like she is doing well. She did have some mild chest discomfort last week and took a nitroglycerin. This is when she was in [place] with her  son and daughter-in-law and they were walking all over town. She managed to walk for several hours without any significant shortness of breath but did have some mild chest discomfort which was relieved fairly quickly with nitroglycerin. Overall she is doing well. She had her hip replacement on the left side which she is recovering from. She still has  discomfort when sitting in a car for long periods of time. She tells me that she's given a be going up to [place] fairly shortly on a vacation and that they will be going up to [facility] for the first time.'  Lauren House returns today for follow-up. She tells me on Saturday night she was awakened early in the morning with palpitations and a warm feeling as well as heaviness in her chest. She says it was quite intense. She denies any vivid dreams prior to that. She's not had any further episodes of chest pain since that or recurrent palpitations. She took a nitroglycerin and the symptoms resolved fairly promptly. She was able to go back to bed. Her EKGs is unchanged showing the T-wave inversions typical of apical variant hypertrophic cardiomyopathy. Her last stress test was in 2014 it was negative for ischemia. She does have a known 50-60% LAD stenosis which was negative by FFR.   07/12/2015  Lauren House was seen today in the office for follow-up. Recently she went to vacation with her family and they noticed more progressive shortness of breath and chest pressure. This was keeping her from doing certain activities. She says her symptoms of gotten worse since her last office visit in December. At that time I performed a nuclear stress test which showed a low normal EF 51% but no reversible ischemia. She does have known 50-60% LAD stenosis which was negative by FFR by catheterization in 2012.  08/26/2015  Lauren House returns today for follow-up. She underwent cardiac catheterization for progressive chest pain concerning for unstable angina on 07/22/2015 by Dr. Ellyn Hack, this demonstrated the following:  1. Mid LAD lesion, 80% stenosed. Post intervention with Promus DES 2.25 mm x 16 mm (2.4 mm), there is a 0% residual stenosis. 2. Ost LAD lesion, 50% stenosed. stable from original catheterization 3. The left ventricular systolic function is normal. with minimal apical filling consistent with known  apical hypertrophy. 4. Otherwise mild to moderate disease in the circumflex and RCA system  Since her coronary angiography and stent placement, she reports she is doing fairly well. She started back doing some exercise and push mowed her lawn the other day. Unfortunate she developed a cracked tooth and she's been having problems with pain. She took antibiotics including doxycycline which causes her stomach upset. She has a temporary crown and will be going back for some more dental work. On questioning today she reports that she has discontinued her Plavix. She said she's been off of it for about one week because of spontaneous bruising. She was not counseled to discontinue this and I reiterated the fact today that being off of dual antiplatelet therapy could put her at risk for acute in-stent thrombosis which could lead to massive heart attack.  11/18/2015  Mrs. Scollard was seen today in follow-up. She has been undergoing cardiac rehabilitation. She restart her Plavix which is very important given her recent stent in May of this past year. She denies any chest pain but still gets short of breath. She says is fairly stable but not necessarily getting worse. She is now gone through 8 sessions rehabilitation and is  up to 3.4 metabolic equivalents. Blood pressures been fairly well-controlled.  05/22/2016  Mrs. Esquivias returns today for follow-up. When I last saw her she was undergoing cardiac rehabilitation and reported shortness of breath with exertion. This was shortly after her stenting I recommended continued cardiac rehabilitation. She says that after finishing rehabilitation she has not had any improvement in her shortness of breath and perhaps it may be worse. She notes it when walking up and down her stairs to her basement. She says after the second trip up the stairs she does get short of breath. She is under a lot of stress and there are some family issues which required her grandson to move in with  her. She denies any chest pain. Blood pressure was elevated today 161/85. She says it is always up in the office, however was 140/80 last time.  06/26/2016  Mrs. Curb was seen today in follow-up. Her echocardiogram shows preserved systolic function, mild diastolic dysfunction and elevated LV filling pressure. We understand that she would have diastolic dysfunction secondary to apical hypertrophic cardiomyopathy. She continues to have shortness of breath with exertion. Her BNP is very low less than 100 and stable. I suspect her symptoms may be due to diastolic dysfunction elevated LVEDP. Blood pressure was over 518 systolic today. In addition, recent lipid profile indicated 2 cholesterol 279, triglycerides 216, HDL 48 and LDL-C1 88. Given her coronary artery disease her goal LDL-C is less than 70. She has been intolerant to numerous statins including rosuvastatin, atorvastatin and pravastatin. She is close to one year since her drug-eluting stent in the LAD.  01/01/2017  Mrs. Wisdom returns today for follow-up.  General she seems to be doing pretty well.  In May we referred her for evaluation to start a PC SK 9 inhibitor.  She had a discussion about starting Repatha however was concerned about cost since she did not have drug coverage.  She is also concerned about not taking the medicine.  She says she ultimately wants to be with her deceased husband.  That being said, she is willing to entertain the medication after I told her today that the cost of come down significantly.  Her cholesterol was recently assessed and LDL remains over 200 untreated.  She has been intolerant to statins as previously outlined in my notes.  Also mentions she has had shingles and 2 bouts of pneumonia since I last saw her.  04/10/2017  Mrs. Patti was seen today in follow-up.  Recently she was seen by primary care provider for bronchitis/pharyngitis.  She was treated with azithromycin.  She still has some scratchiness in her  throat but is improving.  She reports she does get short of breath with exertion, worse than it was a couple years ago but generally has been stable.  She denies any angina.  She is thought about the injectable PCS K9 inhibitors, but declines it at this time.  Unfortunately, her son recently died after being found to have cancer.  12/18/2017  Ms. Zeigler is seen today in follow-up.  Recently she has been doing some traveling with her son.  She went to [place] and another religious museum.  They were walking all day and she did get short of breath but denied any chest pain with this.  Overall she is done fairly well.  She is a little nervous about the fact that her Ranexa is now going to be generic, but I reassured her the medication would be  very similar.  Blood pressure was well controlled today.  Her EKG appears unchanged.  Unfortunately she had a recent mammogram which was abnormal and they are concerned about a chest wall cyst.  She is concerned that this may be cancer.  PMHx:  Past Medical History:  Diagnosis Date  . Anxiety   . Apical variant hypertrophic cardiomyopathy ([place])   . Arthritis   . Borderline diabetes mellitus   . Bruises easily   . Bursitis of left hip   . Coronary atherosclerosis of native coronary artery    a. 2011: Cath showing nonobstructive CAD - 50-60% LAD (normal FFR) b. 06/2015: Cath showing 80% stenosis in the Mid LAD, tx w/ Promus DES 2.25 mm x 16 mm   . Essential hypertension, benign   . History of kidney stones   . History of skin cancer    Basal cell - abdomen  . Hyperlipidemia    Diet controlled.cannot take meds  . Nocturia   . Osteoporosis   . Overactive bladder   . Shortness of breath dyspnea   . Vertigo     Past Surgical History:  Procedure Laterality Date  . ABDOMINAL HYSTERECTOMY    . CARDIAC CATHETERIZATION N/A 07/22/2015   Procedure: Left Heart Cath and Coronary Angiography;  Surgeon: Leonie Man, MD;  Location:  [facility];  Service: Cardiovascular;  Laterality: N/A;  . CARDIAC CATHETERIZATION N/A 07/22/2015   Procedure: Coronary Stent Intervention;  Surgeon: Leonie Man, MD;  Location: [facility];  Service: Cardiovascular;  Laterality: N/A;  . CORONARY STENT PLACEMENT  07/22/2015   mid lad  des   . EYE SURGERY     cataracts bilateral  . HEMORRHOID SURGERY    . INGUINAL HERNIA REPAIR  10/11/2011   Procedure: HERNIA REPAIR INGUINAL ADULT;  Surgeon: Stark Klein, MD;  Location: [facility];  Service: General;  Laterality: Right;  . OPEN SURGICAL REPAIR OF GLUTEAL TENDON Left 03/12/2014   Procedure: LEFT HIP BURSECTOMY AND GLUTEAL TENDON REPAIR;  Surgeon: [name], MD;  Location: [facility];  Service: Orthopedics;  Laterality: Left;    FAMHx:  Family History  Problem Relation Age of Onset  . Heart disease Father   . Lung cancer Sister   . Lung cancer Brother     SOCHx:   reports that she has never smoked. She has never used smokeless tobacco. She reports that she does not drink alcohol or use drugs.  ALLERGIES:  Allergies  Allergen Reactions  . Chlorhexidine Itching  . Vancomycin Hives  . Aspirin Other (See Comments)    Nose bleeds with full strength  . Atorvastatin Other (See Comments)    Muscle pain  . Bactrim [Sulfamethoxazole-Trimethoprim] Other (See Comments)    Doesn't remember   . Cephalosporins Hives and Itching  . Ciprofloxacin Nausea Only  . Clarithromycin Other (See Comments)    Doesn't remember   . Colestipol Hcl Other (See Comments)    Hallucinations  . Ezetimibe Other (See Comments)    GI upset  . Prednisone   . Simvastatin Other (See Comments)    Muscle spasms  . Penicillins Rash    Has patient had a PCN reaction causing immediate rash, facial/tongue/throat swelling, SOB or lightheadedness with hypotension: yes Has patient had a PCN reaction causing severe rash involving mucus membranes or skin necrosis: no Has patient had a PCN reaction that  required hospitalization no Has patient had a PCN reaction occurring within the last 10 years: no If  all of the above answers are "NO", then may proceed with Cephalosporin use.   . Tape Rash    ROS: Pertinent items noted in HPI and remainder of comprehensive ROS otherwise negative.  HOME MEDS: Current Outpatient Medications  Medication Sig Dispense Refill  . aspirin EC 81 MG tablet Take 81 mg by mouth daily.    . Cyanocobalamin (VITAMIN B12 PO) Take 1 tablet by mouth daily.    . fluticasone (FLONASE) 50 MCG/ACT nasal spray USE TWO SPRAY IN EACH NOSTRIL EVERY DAY 50 g 1  . HYDROcodone-acetaminophen (NORCO/VICODIN) 5-325 MG tablet 1 or 2 tabs PO q12 hours prn pain 8 tablet 0  . hydrocortisone cream 1 % Apply 1 application topically 2 (two) times daily. (Patient taking differently: Apply 1 application at bedtime topically. ) 30 g 0  . metoprolol tartrate (LOPRESSOR) 25 MG tablet Take 0.5 tablets (12.5 mg total) by mouth 2 (two) times daily. 90 tablet 1  . Multiple Vitamins-Minerals (OCUVITE PO) Take 1 tablet by mouth daily.    . nitroGLYCERIN (NITROSTAT) 0.4 MG SL tablet Place 1 tablet (0.4 mg total) under the tongue every 5 (five) minutes as needed for chest pain. 25 tablet 3  . Polyethyl Glycol-Propyl Glycol (SYSTANE OP) Apply 1 drop to eye 3 (three) times daily as needed (Dry eyes).    . ranolazine (RANEXA) 500 MG 12 hr tablet Take 1 tablet (500 mg total) by mouth 2 (two) times daily. 180 tablet 3   No current facility-administered medications for this visit.     LABS/IMAGING: No results found for this or any previous visit (from the past 48 hour(s)). US Breast Complete Uni Left Inc Axilla  Result Date: 12/17/2017 CLINICAL DATA:  82 year old female presenting for evaluation of a palpable area in the lower outer left breast. She previously had some tenderness at this site, but feels that it has nearly completely resolved. EXAM: DIGITAL DIAGNOSTIC LEFT MAMMOGRAM WITH CAD AND TOMO  ULTRASOUND LEFT BREAST COMPARISON:  Previous exam(s). ACR Breast Density Category c: The breast tissue is heterogeneously dense, which may obscure small masses. FINDINGS: In the lower outer quadrant of the left breast in the vicinity of the palpable marker, there is a low-density oval obscured mass measuring approximately 8 mm. Mammographic images were processed with CAD. Ultrasound of the left breast at 5:30, 1 cm from the nipple demonstrates an oval hypoechoic mass with both circumscribed and indistinct margins measuring 8 x 4 x 8 mm. IMPRESSION: 1.  There is an indeterminate mass in the left breast at 5:30. RECOMMENDATION: Ultrasound guided biopsy is recommended for the left breast mass. I have discussed the findings and recommendations with the patient. Results were also provided in writing at the conclusion of the visit. If applicable, a reminder letter will be sent to the patient regarding the next appointment. BI-RADS CATEGORY  4: Suspicious. Electronically Signed   By: Ammie Ferrier M.D.   On: 12/17/2017 10:26   Mm Diag Breast Tomo Uni Left  Result Date: 12/17/2017 CLINICAL DATA:  82 year old female presenting for evaluation of a palpable area in the lower outer left breast. She previously had some tenderness at this site, but feels that it has nearly completely resolved. EXAM: DIGITAL DIAGNOSTIC LEFT MAMMOGRAM WITH CAD AND TOMO ULTRASOUND LEFT BREAST COMPARISON:  Previous exam(s). ACR Breast Density Category c: The breast tissue is heterogeneously dense, which may obscure small masses. FINDINGS: In the lower outer quadrant of the left breast in the vicinity of the palpable marker, there is a  low-density oval obscured mass measuring approximately 8 mm. Mammographic images were processed with CAD. Ultrasound of the left breast at 5:30, 1 cm from the nipple demonstrates an oval hypoechoic mass with both circumscribed and indistinct margins measuring 8 x 4 x 8 mm. IMPRESSION: 1.  There is an  indeterminate mass in the left breast at 5:30. RECOMMENDATION: Ultrasound guided biopsy is recommended for the left breast mass. I have discussed the findings and recommendations with the patient. Results were also provided in writing at the conclusion of the visit. If applicable, a reminder letter will be sent to the patient regarding the next appointment. BI-RADS CATEGORY  4: Suspicious. Electronically Signed   By: Ammie Ferrier M.D.   On: 12/17/2017 10:26    VITALS: BP 124/70   Pulse 68   Ht 5\' 7"  (1.702 m)   Wt 152 lb (68.9 kg)   BMI 23.81 kg/m   EXAM: General appearance: alert and no distress Neck: no carotid bruit, no JVD and thyroid not enlarged, symmetric, no tenderness/mass/nodules Lungs: clear to auscultation bilaterally Heart: regular rate and rhythm, S1, S2 normal, no murmur, click, rub or gallop Abdomen: soft, non-tender; bowel sounds normal; no masses,  no organomegaly Extremities: extremities normal, atraumatic, no cyanosis or edema Pulses: 2+ and symmetric Skin: Skin color, texture, turgor normal. No rashes or lesions Neurologic: Grossly normal Psych: Pleasant  EKG: Sinus rhythm 68, possible left atrial enlargement, anterolateral deep T wave inversions, stable-personally reviewed  ASSESSMENT: 1. History of DOE - echo with normal systolic function, mild diastolic dysfunction and elevated LVEDP  2. Unstable angina - status post PCI with a Promus DES (2.25 mm x 16 mm to the mid LAD)- known 50-60% ostial LAD stenosis (FFR 0.99 in 2011) which was stable at repeat cath (06/2015) 3. Apical variant hypertrophic cardimyopathy - anterolateral TWI's - negative nuclear stress test 01-2015 4. Hypertension 5. Venous insufficiency 6. Right inguinal pain with a hernia, status post surgical repair 7. Right hip pain with degenerative hip disease - s/p surgery 8. Statin intolerance with high LDL  PLAN: 1.   Mrs. Steppe does not seem to have anginal complaints.  She did have  shortness of breath but with marked exertion.  Unfortunately she recently was found to have a breast mass which is a cystic.  She will need a biopsy and hopefully this will not be cancerous.  She is advised that she could hold her aspirin 7 days prior to the procedure as necessary for the biopsy.  Plan follow-up annually or sooner as necessary.  [name], MD, [facility], [place] Director of the Advanced Lipid Disorders &  Cardiovascular Risk Reduction Clinic Diplomate of the American Board of Clinical Lipidology Attending Cardiologist  Direct Dial: [phone]  Fax: [phone]  Website:  [email or web address] 12/18/2017, 12:11 PM

## 2017-12-20 ENCOUNTER — Encounter: Payer: Self-pay | Admitting: Internal Medicine

## 2017-12-23 ENCOUNTER — Other Ambulatory Visit: Payer: Self-pay | Admitting: Family Medicine

## 2017-12-23 DIAGNOSIS — N6320 Unspecified lump in the left breast, unspecified quadrant: Secondary | ICD-10-CM

## 2017-12-24 ENCOUNTER — Other Ambulatory Visit: Payer: Self-pay | Admitting: Family Medicine

## 2017-12-24 ENCOUNTER — Ambulatory Visit (HOSPITAL_COMMUNITY)
Admission: RE | Admit: 2017-12-24 | Discharge: 2017-12-24 | Disposition: A | Discharge status: Home / Self Care | Payer: Medicare Other | Source: Ambulatory Visit | Attending: Family Medicine | Admitting: Family Medicine

## 2017-12-24 DIAGNOSIS — R928 Other abnormal and inconclusive findings on diagnostic imaging of breast: Secondary | ICD-10-CM

## 2017-12-24 DIAGNOSIS — N6323 Unspecified lump in the left breast, lower outer quadrant: Secondary | ICD-10-CM | POA: Diagnosis not present

## 2017-12-24 DIAGNOSIS — N6320 Unspecified lump in the left breast, unspecified quadrant: Secondary | ICD-10-CM

## 2017-12-24 MED ORDER — LIDOCAINE HCL (PF) 1 % IJ SOLN
INTRAMUSCULAR | Status: AC
  Administered 2017-12-24: 2 mL
  Filled 2017-12-24: qty 5

## 2017-12-24 MED ORDER — LIDOCAINE-EPINEPHRINE (PF) 1 %-1:200000 IJ SOLN
INTRAMUSCULAR | Status: AC
  Administered 2017-12-24: 4 mL
  Filled 2017-12-24: qty 30

## 2018-01-14 DIAGNOSIS — M25552 Pain in left hip: Secondary | ICD-10-CM | POA: Diagnosis not present

## 2018-01-14 DIAGNOSIS — M25551 Pain in right hip: Secondary | ICD-10-CM | POA: Diagnosis not present

## 2018-01-15 ENCOUNTER — Other Ambulatory Visit (HOSPITAL_COMMUNITY): Payer: Self-pay | Admitting: Surgical

## 2018-01-15 DIAGNOSIS — M25552 Pain in left hip: Secondary | ICD-10-CM

## 2018-01-16 ENCOUNTER — Encounter (HOSPITAL_COMMUNITY): Payer: Self-pay | Admitting: *Deleted

## 2018-01-16 ENCOUNTER — Emergency Department (HOSPITAL_COMMUNITY)
Admission: EM | Admit: 2018-01-16 | Discharge: 2018-01-16 | Disposition: A | Discharge status: Home / Self Care | Payer: Medicare Other | Attending: Emergency Medicine | Admitting: Emergency Medicine

## 2018-01-16 ENCOUNTER — Emergency Department (HOSPITAL_COMMUNITY): Payer: Medicare Other

## 2018-01-16 ENCOUNTER — Other Ambulatory Visit: Payer: Self-pay

## 2018-01-16 DIAGNOSIS — I10 Essential (primary) hypertension: Secondary | ICD-10-CM | POA: Insufficient documentation

## 2018-01-16 DIAGNOSIS — I25119 Atherosclerotic heart disease of native coronary artery with unspecified angina pectoris: Secondary | ICD-10-CM | POA: Insufficient documentation

## 2018-01-16 DIAGNOSIS — W06XXXA Fall from bed, initial encounter: Secondary | ICD-10-CM | POA: Insufficient documentation

## 2018-01-16 DIAGNOSIS — R5381 Other malaise: Secondary | ICD-10-CM | POA: Diagnosis not present

## 2018-01-16 DIAGNOSIS — M25552 Pain in left hip: Secondary | ICD-10-CM | POA: Insufficient documentation

## 2018-01-16 DIAGNOSIS — Z85828 Personal history of other malignant neoplasm of skin: Secondary | ICD-10-CM | POA: Diagnosis not present

## 2018-01-16 DIAGNOSIS — S79912A Unspecified injury of left hip, initial encounter: Secondary | ICD-10-CM | POA: Diagnosis not present

## 2018-01-16 DIAGNOSIS — W19XXXD Unspecified fall, subsequent encounter: Secondary | ICD-10-CM

## 2018-01-16 MED ORDER — HYDROCODONE-ACETAMINOPHEN 5-325 MG PO TABS
1.0000 | ORAL_TABLET | Freq: Once | ORAL | Status: AC
  Administered 2018-01-16: 1 via ORAL
  Filled 2018-01-16: qty 1

## 2018-01-16 MED ORDER — SENNOSIDES-DOCUSATE SODIUM 8.6-50 MG PO TABS: 1.0000 | ORAL_TABLET | Freq: Every evening | ORAL | 0 refills | Status: AC | PRN

## 2018-01-16 MED ORDER — HYDROCODONE-ACETAMINOPHEN 5-325 MG PO TABS: 1.0000 | ORAL_TABLET | ORAL | 0 refills | Status: AC | PRN

## 2018-01-16 NOTE — ED Triage Notes (Signed)
Pt c/o left hip pain that occurred after a fall 2 weeks ago. Pt is scheduled for MRI in the next few days but due to the pain reports she is unable to wait to get the MRI done. Pt was ambulatory on scene when EMS arrived. Pt has previous left hip pain 3-4 years ago.

## 2018-01-16 NOTE — Discharge Instructions (Signed)
We believe that your symptoms are caused by musculoskeletal strain.  Please read through the included information about additional care such as heating pads, over-the-counter pain medicine.  If you were provided a prescription please use it only as needed and as instructed.  Remember that early mobility and using the affected part of your body is actually better than keeping it immobile.  You should only take Hydrocodone as needed for severe pain. This may make you sleepy so do not take this if you need to be up and walking around. This medication has some Tylenol including in this medication. You are taking additional Tylenol at home, be sur to not take more than 4,000 mg of Tylenol in a 24 hour period. I have also called in a prescription for a stool softener as Hydrocodone can cause constipation.   Follow-up with the doctor listed as recommended or return to the emergency department with new or worsening symptoms that concern you.

## 2018-01-16 NOTE — ED Provider Notes (Signed)
Emergency Department Provider Note   I have reviewed the triage vital signs and the nursing notes.   HISTORY  Chief Complaint Hip Pain   HPI Lauren House is a 82 y.o. female with PMH of CAD, HTN, osteoporosis, and arthritis [place] to the emergency department with worsening left hip pain after a mechanical fall 2 weeks ago.  Patient states that she woke from sleep in the middle the night with the alarm going off.  She states she was disoriented and fell at the bedside striking her left knee and hip on the ground in bed.  She did strike her head on the nightstand.  States that her knee pain has resolved as well as her initial headache.  She has not experienced any weakness or numbness.  No double vision.  She is continued to have left hip pain that is been worsening over the past 2 weeks.  Pain is moderate and worse with movement.  She has seen [facility] who obtained plain films of the left hip and have scheduled the patient for an outpatient MRI early next week.  Patient has been using a cane to walk at home.  No additional falls.  She is been taking Tylenol for pain.    Past Medical History:  Diagnosis Date  . Anxiety   . Apical variant hypertrophic cardiomyopathy ([place])   . Arthritis   . Borderline diabetes mellitus   . Bruises easily   . Bursitis of left hip   . Coronary atherosclerosis of native coronary artery    a. 2011: Cath showing nonobstructive CAD - 50-60% LAD (normal FFR) b. 06/2015: Cath showing 80% stenosis in the Mid LAD, tx w/ Promus DES 2.25 mm x 16 mm   . Essential hypertension, benign   . History of kidney stones   . History of skin cancer    Basal cell - abdomen  . Hyperlipidemia    Diet controlled.cannot take meds  . Nocturia   . Osteoporosis   . Overactive bladder   . Shortness of breath dyspnea   . Vertigo     Patient Active Problem List   Diagnosis Date Noted  . Coronary artery disease due to lipid rich plaque 01/01/2017  . CAD S/P  percutaneous coronary angioplasty 07/22/2015  . DOE (dyspnea on exertion)   . Unstable angina ([place]) 07/12/2015  . Heart palpitations 02/14/2015  . Hip bursitis 03/13/2014  . Trochanteric bursitis of left hip 03/12/2014  . Exertional chest pain 02/10/2014  . Preoperative cardiovascular examination 02/10/2014  . Angina pectoris, variant ([place]) 12/24/2012  . Left hip pain 12/16/2012  . Bursitis of left hip 12/16/2012  . Osteoarthritis, hip, bilateral 12/16/2012  . Chest pain 08/15/2012  . Apical variant hypertrophic cardiomyopathy ([place]) 08/15/2012  . Bilateral inguinal hernia, right symptomatic 09/21/2011  . COMEDO 10/18/2008  . POSTMENOPAUSAL OSTEOPOROSIS 09/01/2008  . HIP PAIN, LEFT 01/19/2008  . CARCINOMA, BASAL CELL, SKIN 10/02/2006  . VARICOSE VEIN 09/24/2006  . ACTINIC KERATOSIS 09/24/2006  . NECK PAIN 09/24/2006  . BACK PAIN 09/24/2006  . NEOP, BNG, SCALP/SKIN, NECK 04/16/2006  . FIBROIDS, UTERUS 04/16/2006  . Mixed hyperlipidemia 04/16/2006  . MACULAR DEGENERATION 04/16/2006  . Essential hypertension 04/16/2006  . ALLERGIC RHINITIS 04/16/2006  . OVERACTIVE BLADDER 04/16/2006  . URINARY INCONTINENCE 04/16/2006  . SKIN CANCER, HX OF 04/16/2006  . VERTIGO, HX OF 04/16/2006  . RENAL CALCULUS, HX OF 04/16/2006    Past Surgical History:  Procedure Laterality Date  . ABDOMINAL HYSTERECTOMY    .  CARDIAC CATHETERIZATION N/A 07/22/2015   Procedure: Left Heart Cath and Coronary Angiography;  Surgeon: Leonie Man, MD;  Location: [facility];  Service: Cardiovascular;  Laterality: N/A;  . CARDIAC CATHETERIZATION N/A 07/22/2015   Procedure: Coronary Stent Intervention;  Surgeon: Leonie Man, MD;  Location: [facility];  Service: Cardiovascular;  Laterality: N/A;  . CORONARY STENT PLACEMENT  07/22/2015   mid lad  des   . EYE SURGERY     cataracts bilateral  . HEMORRHOID SURGERY    . INGUINAL HERNIA REPAIR  10/11/2011   Procedure: HERNIA REPAIR INGUINAL ADULT;   Surgeon: Stark Klein, MD;  Location: [facility];  Service: General;  Laterality: Right;  . OPEN SURGICAL REPAIR OF GLUTEAL TENDON Left 03/12/2014   Procedure: LEFT HIP BURSECTOMY AND GLUTEAL TENDON REPAIR;  Surgeon: [name], MD;  Location: [facility];  Service: Orthopedics;  Laterality: Left;    Allergies Chlorhexidine; Vancomycin; Aspirin; Atorvastatin; Bactrim [sulfamethoxazole-trimethoprim]; Cephalosporins; Ciprofloxacin; Clarithromycin; Colestipol hcl; Ezetimibe; Prednisone; Simvastatin; Penicillins; and Tape  Family History  Problem Relation Age of Onset  . Heart disease Father   . Lung cancer Sister   . Lung cancer Brother     Social History Social History   Tobacco Use  . Smoking status: Never Smoker  . Smokeless tobacco: Never Used  Substance Use Topics  . Alcohol use: No  . Drug use: No    Review of Systems  Constitutional: No fever/chills Eyes: No visual changes. ENT: No sore throat. Cardiovascular: Denies chest pain. Respiratory: Denies shortness of breath. Gastrointestinal: No abdominal pain.  No nausea, no vomiting.  No diarrhea.  No constipation. Genitourinary: Negative for dysuria. Musculoskeletal: Negative for back pain. Positive left hip pain.  Skin: Negative for rash. Neurological: Negative for headaches, focal weakness or numbness.  10-point ROS otherwise negative.  ____________________________________________   PHYSICAL EXAM:  VITAL SIGNS: ED Triage Vitals  Enc Vitals Group     BP 01/16/18 0804 (!) 147/86     Pulse Rate 01/16/18 0804 87     Resp 01/16/18 0804 16     Temp 01/16/18 0804 98 F (36.7 C)     Temp Source 01/16/18 0804 Oral     SpO2 01/16/18 0755 98 %     Weight 01/16/18 0800 150 lb (68 kg)     Height 01/16/18 0800 5\' 7"  (1.702 m)     Pain Score 01/16/18 0759 10   Constitutional: Alert and oriented. Well appearing and in no acute distress. Eyes: Conjunctivae are normal.  Head: Atraumatic. Nose: No  congestion/rhinnorhea. Mouth/Throat: Mucous membranes are moist.  Neck: No stridor. Cardiovascular: Normal rate, regular rhythm. Good peripheral circulation normal DP/PT pulses bilaterally. Grossly normal heart sounds.   Respiratory: Normal respiratory effort.  No retractions. Lungs CTAB. Gastrointestinal: Soft and nontender. No distention.  Musculoskeletal: Left hip tenderness laterally with pain on internal rotation of the left hip. No tenderness to palpation of the left knee and ankle.  Neurologic:  Normal speech and language. No gross focal neurologic deficits are appreciated.  Skin:  Skin is warm, dry and intact. No rash noted.  ____________________________________________  RADIOLOGY  Ct Hip Left Wo Contrast  Result Date: 01/16/2018 CLINICAL DATA:  Persistent left hip pain since fall 2 weeks ago. EXAM: CT OF THE LEFT HIP WITHOUT CONTRAST TECHNIQUE: Multidetector CT imaging of the left hip was performed according to the standard protocol. Multiplanar CT image reconstructions were also generated. COMPARISON:  CT abdomen pelvis dated August 24, 2011. FINDINGS:  Bones/Joint/Cartilage No fracture or dislocation. Normal alignment. No joint effusion. Small left hip marginal osteophytes with relative preservation of the joint space. Degenerative changes of the pubic symphysis and left sacroiliac joint. Ligaments Ligaments are suboptimally evaluated by CT. Muscles and Tendons Suture anchors within the left greater tuberosity related to prior gluteal tendon repair. The gluteal, hamstring, and iliopsoas tendons are grossly intact. Mild gluteus medius and minimus muscle atrophy. Soft tissue No fluid collection or hematoma. No soft tissue mass. Small fat containing left inguinal hernia. Sigmoid diverticulosis. Prior hysterectomy. Atherosclerotic vascular calcifications. IMPRESSION: 1.  No acute osseous abnormality. Electronically Signed   By: Titus Dubin M.D.   On: 01/16/2018 09:28     ____________________________________________   PROCEDURES  Procedure(s) performed:   Procedures  None ____________________________________________   INITIAL IMPRESSION / ASSESSMENT AND PLAN / ED COURSE  Pertinent labs & imaging results that were available during my care of the patient were reviewed by me and considered in my medical decision making (see chart for details).  Patient presents to the emergency department with worsening left hip pain after a fall 2 weeks ago.  She has pain with internal rotation of the left hip.  No concern for septic arthritis or vascular etiology of pain.  Do question possibility of occult fracture missed on initial x-ray.  Plan for CT imaging of the left hip.  Suspicion is not high enough for this to warrant emergent MRI.  Patient has MRI scheduled for early next week as well as ortho follow up. Patient has tolerated Vicodin in the recent past. Will give that here for pain control and reassess. Patient has a walker at home which I encouraged her to begin using, temporarily, or  Increased stability.   9:41 AM No bony abnormality on CT scan.  Patient scheduled for MRI early next week.  She is tolerating the hydrocodone well here.  Called in prescription for hydrocodone for severe, breakthrough pain.  Also called in prescription for stool softener.  Patient has a walker at home which I encouraged her to use for increased stability.  She will follow-up with PCP and orthopedics as scheduled. ED return precautions discussed.  ____________________________________________  FINAL CLINICAL IMPRESSION(S) / ED DIAGNOSES  Final diagnoses:  Left hip pain  Fall, subsequent encounter     MEDICATIONS GIVEN DURING THIS VISIT:  Medications  HYDROcodone-acetaminophen (NORCO/VICODIN) 5-325 MG per tablet 1 tablet (1 tablet Oral Given 01/16/18 0844)     NEW OUTPATIENT MEDICATIONS STARTED DURING THIS VISIT:  New Prescriptions   HYDROCODONE-ACETAMINOPHEN  (NORCO/VICODIN) 5-325 MG TABLET    Take 1 tablet by mouth every 4 (four) hours as needed for up to 3 days.   SENNA-DOCUSATE (SENOKOT-S) 8.6-50 MG TABLET    Take 1 tablet by mouth at bedtime as needed for mild constipation or moderate constipation.    Note:  This document was prepared using Dragon voice recognition software and may include unintentional dictation errors.  Nanda Quinton, MD Emergency Medicine    Long, Wonda Olds, MD 01/16/18 [phone]

## 2018-01-21 ENCOUNTER — Ambulatory Visit (HOSPITAL_COMMUNITY)
Admission: RE | Admit: 2018-01-21 | Discharge: 2018-01-21 | Disposition: A | Discharge status: Home / Self Care | Payer: Medicare Other | Source: Ambulatory Visit | Attending: Surgical | Admitting: Surgical

## 2018-01-21 DIAGNOSIS — R609 Edema, unspecified: Secondary | ICD-10-CM | POA: Insufficient documentation

## 2018-01-21 DIAGNOSIS — M25552 Pain in left hip: Secondary | ICD-10-CM | POA: Insufficient documentation

## 2018-01-21 DIAGNOSIS — S300XXA Contusion of lower back and pelvis, initial encounter: Secondary | ICD-10-CM | POA: Insufficient documentation

## 2018-01-21 DIAGNOSIS — S32402A Unspecified fracture of left acetabulum, initial encounter for closed fracture: Secondary | ICD-10-CM | POA: Diagnosis not present

## 2018-01-22 NOTE — Addendum Note (Signed)
Addended by: Delsa Grana on: 01/22/2018 07:44 PM   Modules accepted: Orders

## 2018-03-19 ENCOUNTER — Other Ambulatory Visit: Payer: Self-pay | Admitting: Cardiology

## 2018-04-22 ENCOUNTER — Ambulatory Visit (INDEPENDENT_AMBULATORY_CARE_PROVIDER_SITE_OTHER): Payer: Medicare Other | Admitting: Family Medicine

## 2018-04-22 ENCOUNTER — Encounter: Payer: Self-pay | Admitting: Family Medicine

## 2018-04-22 VITALS — BP 136/78 | HR 65 | Temp 98.0°F | Resp 15 | Ht 67.0 in | Wt 149.1 lb

## 2018-04-22 DIAGNOSIS — R21 Rash and other nonspecific skin eruption: Secondary | ICD-10-CM

## 2018-04-22 MED ORDER — TRIAMCINOLONE ACETONIDE 0.1 % EX OINT: 1.0000 "application " | TOPICAL_OINTMENT | Freq: Two times a day (BID) | CUTANEOUS | 0 refills | Status: DC

## 2018-04-22 NOTE — Progress Notes (Signed)
Patient ID: Lauren House, female    DOB: 07/25/1935, 83 y.o.   MRN: [id number]  PCP: Susy Frizzle, MD  Chief Complaint  Patient presents with  . Foot Pain    Patient in with c/o left foot pain and swelling. Onset 1 week ago.    Subjective:   Lauren House is a 83 y.o. female, presents to clinic with CC of left foot pain to lateral dorsal foot, onset about a week ago, gradually getting better, area is swollen and red, but that has also gotten better.  No injury, deformity, bruising.  There was mild itching/burning feeling associated with the redness and swelling, and without any treatment everything has been gradually improving.  There is not streaking redness, open wounds, bites scratches.  She has no other areas with similar skin changes.  She has not tried any medicines, there was no new soaps, lotions or detergents to the area.       Patient Active Problem List   Diagnosis Date Noted  . Coronary artery disease due to lipid rich plaque 01/01/2017  . CAD S/P percutaneous coronary angioplasty 07/22/2015  . DOE (dyspnea on exertion)   . Unstable angina ([place]) 07/12/2015  . Heart palpitations 02/14/2015  . Hip bursitis 03/13/2014  . Trochanteric bursitis of left hip 03/12/2014  . Exertional chest pain 02/10/2014  . Preoperative cardiovascular examination 02/10/2014  . Angina pectoris, variant ([place]) 12/24/2012  . Left hip pain 12/16/2012  . Bursitis of left hip 12/16/2012  . Osteoarthritis, hip, bilateral 12/16/2012  . Chest pain 08/15/2012  . Apical variant hypertrophic cardiomyopathy ([place]) 08/15/2012  . Bilateral inguinal hernia, right symptomatic 09/21/2011  . COMEDO 10/18/2008  . POSTMENOPAUSAL OSTEOPOROSIS 09/01/2008  . HIP PAIN, LEFT 01/19/2008  . CARCINOMA, BASAL CELL, SKIN 10/02/2006  . VARICOSE VEIN 09/24/2006  . ACTINIC KERATOSIS 09/24/2006  . NECK PAIN 09/24/2006  . BACK PAIN 09/24/2006  . NEOP, BNG, SCALP/SKIN, NECK 04/16/2006  . FIBROIDS, UTERUS  04/16/2006  . Mixed hyperlipidemia 04/16/2006  . MACULAR DEGENERATION 04/16/2006  . Essential hypertension 04/16/2006  . ALLERGIC RHINITIS 04/16/2006  . OVERACTIVE BLADDER 04/16/2006  . URINARY INCONTINENCE 04/16/2006  . SKIN CANCER, HX OF 04/16/2006  . VERTIGO, HX OF 04/16/2006  . RENAL CALCULUS, HX OF 04/16/2006     Prior to Admission medications   Medication Sig Start Date End Date Taking? Authorizing Provider  aspirin EC 81 MG tablet Take 81 mg by mouth daily.   Yes [provider]  Cyanocobalamin (VITAMIN B12 PO) Take 1 tablet by mouth daily.   Yes [provider]  fluticasone (FLONASE) 50 MCG/ACT nasal spray USE TWO SPRAY IN EACH NOSTRIL EVERY DAY 11/01/15  Yes Susy Frizzle, MD  hydrocortisone cream 1 % Apply 1 application topically 2 (two) times daily. Patient taking differently: Apply 1 application at bedtime topically.  08/31/16  Yes Searcy, Modena Nunnery, MD  metoprolol tartrate (LOPRESSOR) 25 MG tablet TAKE 1/2 (ONE-HALF) TABLET BY MOUTH TWICE DAILY 03/19/18  Yes Hilty, Nadean Corwin, MD  Multiple Vitamins-Minerals (OCUVITE PO) Take 1 tablet by mouth daily.   Yes [provider]  Polyethyl Glycol-Propyl Glycol (SYSTANE OP) Apply 1 drop to eye 3 (three) times daily as needed (Dry eyes).   Yes [provider]  ranolazine (RANEXA) 500 MG 12 hr tablet Take 1 tablet (500 mg total) by mouth 2 (two) times daily. 05/31/16  Yes Hilty, Nadean Corwin, MD  senna-docusate (SENOKOT-S) 8.6-50 MG tablet Take 1 tablet by mouth  at bedtime as needed for mild constipation or moderate constipation. 01/16/18  Yes Long, Wonda Olds, MD  nitroGLYCERIN (NITROSTAT) 0.4 MG SL tablet Place 1 tablet (0.4 mg total) under the tongue every 5 (five) minutes as needed for chest pain. Patient not taking: Reported on 04/22/2018 08/26/14   [name], MD     Allergies  Allergen Reactions  . Chlorhexidine Itching  . Vancomycin Hives  . Aspirin Other (See Comments)    Nose bleeds with  full strength  . Atorvastatin Other (See Comments)    Muscle pain  . Bactrim [Sulfamethoxazole-Trimethoprim] Other (See Comments)    Doesn't remember   . Cephalosporins Hives and Itching  . Ciprofloxacin Nausea Only  . Clarithromycin Other (See Comments)    Doesn't remember   . Colestipol Hcl Other (See Comments)    Hallucinations  . Ezetimibe Other (See Comments)    GI upset  . Prednisone   . Simvastatin Other (See Comments)    Muscle spasms  . Penicillins Rash    Has patient had a PCN reaction causing immediate rash, facial/tongue/throat swelling, SOB or lightheadedness with hypotension: yes Has patient had a PCN reaction causing severe rash involving mucus membranes or skin necrosis: no Has patient had a PCN reaction that required hospitalization no Has patient had a PCN reaction occurring within the last 10 years: no If all of the above answers are "NO", then may proceed with Cephalosporin use.   . Tape Rash     Family History  Problem Relation Age of Onset  . Heart disease Father   . Lung cancer Sister   . Lung cancer Brother      Social History   Socioeconomic History  . Marital status: Widowed    Spouse name: Not on file  . Number of children: Not on file  . Years of education: Not on file  . Highest education level: Not on file  Occupational History  . Not on file  Social Needs  . Financial resource strain: Not on file  . Food insecurity:    Worry: Not on file    Inability: Not on file  . Transportation needs:    Medical: Not on file    Non-medical: Not on file  Tobacco Use  . Smoking status: Never Smoker  . Smokeless tobacco: Never Used  Substance and Sexual Activity  . Alcohol use: No  . Drug use: No  . Sexual activity: Never  Lifestyle  . Physical activity:    Days per week: Not on file    Minutes per session: Not on file  . Stress: Not on file  Relationships  . Social connections:    Talks on phone: Not on file    Gets together: Not on  file    Attends religious service: Not on file    Active member of club or organization: Not on file    Attends meetings of clubs or organizations: Not on file    Relationship status: Not on file  . Intimate partner violence:    Fear of current or ex partner: Not on file    Emotionally abused: Not on file    Physically abused: Not on file    Forced sexual activity: Not on file  Other Topics Concern  . Not on file  Social History Narrative  . Not on file     Review of Systems  Constitutional: Negative.   HENT: Negative.   Eyes: Negative.   Respiratory: Negative.   Cardiovascular: Negative.  Gastrointestinal: Negative.   Endocrine: Negative.   Genitourinary: Negative.   Musculoskeletal: Negative.   Skin: Negative.   Allergic/Immunologic: Negative.   Neurological: Negative.   Hematological: Negative.   Psychiatric/Behavioral: Negative.   All other systems reviewed and are negative.      Objective:    Vitals:   04/22/18 1123  BP: 136/78  Pulse: 65  Resp: 15  Temp: 98 F (36.7 C)  TempSrc: Oral  SpO2: 98%  Weight: 149 lb 2 oz (67.6 kg)  Height: 5\' 7"  (1.702 m)      Physical Exam Vitals signs and nursing note reviewed.  Constitutional:      Appearance: She is well-developed.     Comments: Well appearing elderly female, NAD  HENT:     Head: Normocephalic and atraumatic.     Nose: Nose normal.  Eyes:     General:        Right eye: No discharge.        Left eye: No discharge.     Conjunctiva/sclera: Conjunctivae normal.  Neck:     Trachea: No tracheal deviation.  Cardiovascular:     Rate and Rhythm: Normal rate and regular rhythm.  Pulmonary:     Effort: Pulmonary effort is normal. No respiratory distress.     Breath sounds: No stridor.  Musculoskeletal: Normal range of motion.     Left ankle: Normal. She exhibits normal range of motion, no swelling, no ecchymosis, no deformity and normal pulse. No tenderness. No lateral malleolus and no medial  malleolus tenderness found. Achilles tendon normal.     Left foot: Normal range of motion and normal capillary refill. Swelling present. No tenderness, bony tenderness, crepitus or deformity.  Skin:    General: Skin is warm and dry.     Findings: No rash.     Comments: 6x4 cm of mild edema and erythema to lateral dorsal foot, central pinpoint area with darker erythema, no induration, no warmth, no fluctuance, no ttp  Neurological:     Mental Status: She is alert.     Sensory: No sensory deficit.     Motor: No weakness or abnormal muscle tone.     Coordination: Coordination normal.     Gait: Gait normal.  Psychiatric:        Behavior: Behavior normal.           Assessment & Plan:      ICD-10-CM   1. Skin rash R21    suspect localized allergic reaction?  doubt cellulitis, topical steroid, benadryl po prn, f/u if worsening   small pinpoint area of darker erythema makes me suspect possible sting/bite from insect?  Sx improving, I do not believe she requires antibiotics and with resolving sx do not believe she needs oral steroids.  Normal sensation, gait, ROM.  Expect it will resolve    Delsa Grana, PA-C 04/22/18 11:31 AM

## 2018-04-22 NOTE — Patient Instructions (Addendum)
Use the topical steroids which should help with pain, stinging, itching, burning and redness.  Can take oral benadryl for same symptoms.  Follow up if any worsening.  Any spreading of the redness, and worsening swelling, any change in pain.  For right knee pain, call your orthopedic doctor to get seen for eval and possible shot.  Looks like an effusion

## 2018-04-28 ENCOUNTER — Encounter: Payer: Self-pay | Admitting: Family Medicine

## 2018-04-28 ENCOUNTER — Ambulatory Visit (HOSPITAL_COMMUNITY)
Admission: RE | Admit: 2018-04-28 | Discharge: 2018-04-28 | Disposition: A | Discharge status: Home / Self Care | Payer: Medicare Other | Source: Ambulatory Visit | Attending: Family Medicine | Admitting: Family Medicine

## 2018-04-28 ENCOUNTER — Ambulatory Visit (INDEPENDENT_AMBULATORY_CARE_PROVIDER_SITE_OTHER): Payer: Medicare Other | Admitting: Family Medicine

## 2018-04-28 VITALS — BP 146/80 | HR 68 | Temp 97.6°F | Resp 16 | Ht 67.0 in | Wt 150.0 lb

## 2018-04-28 DIAGNOSIS — M25532 Pain in left wrist: Secondary | ICD-10-CM | POA: Diagnosis not present

## 2018-04-28 DIAGNOSIS — M1812 Unilateral primary osteoarthritis of first carpometacarpal joint, left hand: Secondary | ICD-10-CM | POA: Diagnosis not present

## 2018-04-28 MED ORDER — DICLOFENAC SODIUM 1 % TD GEL: 2.0000 g | Freq: Four times a day (QID) | TRANSDERMAL | 1 refills | Status: DC

## 2018-04-28 NOTE — Progress Notes (Signed)
Subjective:    Patient ID: Lauren House, female    DOB: [birth year], 83 y.o.   MRN: [id number]  HPI  Patient sustained a left hip fracture last November after a fall.  She does not collect injuring her left wrist at that same time however yesterday, the patient developed sharp severe pain in the left anatomic snuffbox.  Today she is exquisitely tender to palpation in that area.  She is also tender to palpation over the thenar eminence.  There is no pain in the first MCP joint or PIP joint of the right thumb.  However she is in pain in her wrist so she is unable to perform Finkelstein's maneuver however the pain seems to be in the bone of the wrist itself and not in the tendon overlying that area.  She has normal range of motion in the MCP PIP and DIP joints of the other fingers on her right hand.  She denies any numbness or tingling in her right hand.  She denies any swelling or erythema in that joint.  She denies any fevers or chills. Past Medical History:  Diagnosis Date  . Anxiety   . Apical variant hypertrophic cardiomyopathy ([place])   . Arthritis   . Borderline diabetes mellitus   . Bruises easily   . Bursitis of left hip   . Coronary atherosclerosis of native coronary artery    a. 2011: Cath showing nonobstructive CAD - 50-60% LAD (normal FFR) b. 06/2015: Cath showing 80% stenosis in the Mid LAD, tx w/ Promus DES 2.25 mm x 16 mm   . Essential hypertension, benign   . History of kidney stones   . History of skin cancer    Basal cell - abdomen  . Hyperlipidemia    Diet controlled.cannot take meds  . Nocturia   . Osteoporosis   . Overactive bladder   . Shortness of breath dyspnea   . Vertigo    Past Surgical History:  Procedure Laterality Date  . ABDOMINAL HYSTERECTOMY    . CARDIAC CATHETERIZATION N/A 07/22/2015   Procedure: Left Heart Cath and Coronary Angiography;  Surgeon: Leonie Man, MD;  Location: [facility];  Service: Cardiovascular;  Laterality: N/A;  .  CARDIAC CATHETERIZATION N/A 07/22/2015   Procedure: Coronary Stent Intervention;  Surgeon: Leonie Man, MD;  Location: [facility];  Service: Cardiovascular;  Laterality: N/A;  . CORONARY STENT PLACEMENT  07/22/2015   mid lad  des   . EYE SURGERY     cataracts bilateral  . HEMORRHOID SURGERY    . INGUINAL HERNIA REPAIR  10/11/2011   Procedure: HERNIA REPAIR INGUINAL ADULT;  Surgeon: Stark Klein, MD;  Location: [facility];  Service: General;  Laterality: Right;  . OPEN SURGICAL REPAIR OF GLUTEAL TENDON Left 03/12/2014   Procedure: LEFT HIP BURSECTOMY AND GLUTEAL TENDON REPAIR;  Surgeon: [name], MD;  Location: [facility];  Service: Orthopedics;  Laterality: Left;   Current Outpatient Medications on File Prior to Visit  Medication Sig Dispense Refill  . aspirin EC 81 MG tablet Take 81 mg by mouth daily.    . Cyanocobalamin (VITAMIN B12 PO) Take 1 tablet by mouth daily.    . fluticasone (FLONASE) 50 MCG/ACT nasal spray USE TWO SPRAY IN EACH NOSTRIL EVERY DAY 50 g 1  . hydrocortisone cream 1 % Apply 1 application topically 2 (two) times daily. (Patient taking differently: Apply 1 application at bedtime topically. ) 30 g 0  . metoprolol tartrate (LOPRESSOR)  25 MG tablet TAKE 1/2 (ONE-HALF) TABLET BY MOUTH TWICE DAILY 30 tablet 9  . Multiple Vitamins-Minerals (OCUVITE PO) Take 1 tablet by mouth daily.    . nitroGLYCERIN (NITROSTAT) 0.4 MG SL tablet Place 1 tablet (0.4 mg total) under the tongue every 5 (five) minutes as needed for chest pain. (Patient not taking: Reported on 04/22/2018) 25 tablet 3  . Polyethyl Glycol-Propyl Glycol (SYSTANE OP) Apply 1 drop to eye 3 (three) times daily as needed (Dry eyes).    . ranolazine (RANEXA) 500 MG 12 hr tablet Take 1 tablet (500 mg total) by mouth 2 (two) times daily. 180 tablet 3  . senna-docusate (SENOKOT-S) 8.6-50 MG tablet Take 1 tablet by mouth at bedtime as needed for mild constipation or moderate constipation. 20 tablet 0  . triamcinolone  ointment (KENALOG) 0.1 % Apply 1 application topically 2 (two) times daily. 30 g 0   No current facility-administered medications on file prior to visit.    Allergies  Allergen Reactions  . Chlorhexidine Itching  . Vancomycin Hives  . Aspirin Other (See Comments)    Nose bleeds with full strength  . Atorvastatin Other (See Comments)    Muscle pain  . Bactrim [Sulfamethoxazole-Trimethoprim] Other (See Comments)    Doesn't remember   . Cephalosporins Hives and Itching  . Ciprofloxacin Nausea Only  . Clarithromycin Other (See Comments)    Doesn't remember   . Colestipol Hcl Other (See Comments)    Hallucinations  . Ezetimibe Other (See Comments)    GI upset  . Prednisone   . Simvastatin Other (See Comments)    Muscle spasms  . Penicillins Rash    Has patient had a PCN reaction causing immediate rash, facial/tongue/throat swelling, SOB or lightheadedness with hypotension: yes Has patient had a PCN reaction causing severe rash involving mucus membranes or skin necrosis: no Has patient had a PCN reaction that required hospitalization no Has patient had a PCN reaction occurring within the last 10 years: no If all of the above answers are "NO", then may proceed with Cephalosporin use.   . Tape Rash   Social History   Socioeconomic History  . Marital status: Widowed    Spouse name: Not on file  . Number of children: Not on file  . Years of education: Not on file  . Highest education level: Not on file  Occupational History  . Not on file  Social Needs  . Financial resource strain: Not on file  . Food insecurity:    Worry: Not on file    Inability: Not on file  . Transportation needs:    Medical: Not on file    Non-medical: Not on file  Tobacco Use  . Smoking status: Never Smoker  . Smokeless tobacco: Never Used  Substance and Sexual Activity  . Alcohol use: No  . Drug use: No  . Sexual activity: Never  Lifestyle  . Physical activity:    Days per week: Not on file      Minutes per session: Not on file  . Stress: Not on file  Relationships  . Social connections:    Talks on phone: Not on file    Gets together: Not on file    Attends religious service: Not on file    Active member of club or organization: Not on file    Attends meetings of clubs or organizations: Not on file    Relationship status: Not on file  . Intimate partner violence:    Fear of  current or ex partner: Not on file    Emotionally abused: Not on file    Physically abused: Not on file    Forced sexual activity: Not on file  Other Topics Concern  . Not on file  Social History Narrative  . Not on file     Review of Systems  All other systems reviewed and are negative.      Objective:   Physical Exam  Constitutional: She appears well-developed and well-nourished. No distress.  Cardiovascular: Normal rate, regular rhythm and normal heart sounds.  Pulmonary/Chest: Effort normal and breath sounds normal. No respiratory distress. She has no wheezes. She has no rales.  Musculoskeletal:     Left wrist: She exhibits decreased range of motion, tenderness and bony tenderness. She exhibits no swelling, no effusion, no crepitus, no deformity and no laceration.  Skin: She is not diaphoretic.  Vitals reviewed. Pain over the anatomic snuffbox.  Pain over the thenar eminence at the distal left wrist.  Pain with flexion and extension of the left wrist as well as extension and flexion of the MCP joint      Assessment & Plan:  Left wrist pain - Plan: DG Wrist Complete Left  I believe the patient has CMC arthritis versus fracture in the navicular bone.  Recommended an x-ray to evaluate further.  If x-ray rules out fracture, would recommend a thumb spica splint and Voltaren gel be applied 4 times a day and repeat imaging in 1 week.  If x-ray confirms a fracture, patient will need orthopedic surgery consultation

## 2018-05-22 ENCOUNTER — Other Ambulatory Visit: Payer: Self-pay | Admitting: Internal Medicine

## 2018-05-22 MED ORDER — RANOLAZINE ER 500 MG PO TB12: 500.0000 mg | ORAL_TABLET | Freq: Two times a day (BID) | ORAL | 3 refills | Status: DC

## 2018-05-22 NOTE — Telephone Encounter (Signed)
 *  STAT* If patient is at the pharmacy, call can be transferred to refill team.   1. Which medications need to be refilled? (please list name of each medication and dose if known) ranolazine (RANEXA) 500 MG 12 hr tablet  2. Which pharmacy/location (including street and city if local pharmacy) is medication to be sent to? [facility]  3. Do they need a 30 day or 90 day supply? [place]

## 2018-05-23 ENCOUNTER — Telehealth: Payer: Self-pay | Admitting: *Deleted

## 2018-05-23 NOTE — Telephone Encounter (Signed)
Patient left a msg on the refill vm stating that the ranexa is too expensive at $900.00 for a ninety day supply. She is requesting a cheaper alternative. Call back number provided [phone]. Thanks, MI

## 2018-05-26 DIAGNOSIS — M79642 Pain in left hand: Secondary | ICD-10-CM | POA: Diagnosis not present

## 2018-05-30 NOTE — Telephone Encounter (Signed)
There is now generic ranolazine - can Rx for that which may be cheaper. Also, direct her to [facility] - with their coupon service, cost can be less than $60/month.  Dr. Lemmie Evens

## 2018-05-30 NOTE — Telephone Encounter (Signed)
Pt updated with Dr. Lysbeth Penner recommendation. She state her daughter has already took care of it for her and went through [facility].

## 2018-07-29 ENCOUNTER — Encounter: Payer: Self-pay | Admitting: Family Medicine

## 2018-07-29 ENCOUNTER — Other Ambulatory Visit: Payer: Self-pay

## 2018-07-29 ENCOUNTER — Ambulatory Visit: Payer: Medicare Other | Admitting: Family Medicine

## 2018-07-29 VITALS — BP 120/72 | HR 81 | Temp 98.3°F | Resp 18 | Ht 67.0 in | Wt 151.4 lb

## 2018-07-29 DIAGNOSIS — M25471 Effusion, right ankle: Secondary | ICD-10-CM

## 2018-07-29 DIAGNOSIS — M25571 Pain in right ankle and joints of right foot: Secondary | ICD-10-CM

## 2018-07-29 NOTE — Patient Instructions (Signed)
Ibuprofen 400mg  twice a day with food for inflammation Elevate the foot  Epson salt soaks for 15 minutes, can do 2-3 times a day  We will call with lab results  Get the xray done at [facility]  F/U pending results

## 2018-07-29 NOTE — Progress Notes (Signed)
   Subjective:    Patient ID: Lauren House, female    DOB: Dec 31, 1935, 83 y.o.   MRN: [id number]  Patient presents for Edema (R foot pain and swelling, x1 week, applied valtaren gel)   Pt here with right ankle/foot swelling with pain for the past week. Denies any fall or injury. Pain started first around ankle, then swelling.  Has been using voltaren gel on the ankle    Has been taking tylenol which helps a little Has OA in other joints including hands   Sees Dr. Ricki Rodriguez      Review Of Systems:  GEN- denies fatigue, fever, weight loss,weakness, recent illness HEENT- denies eye drainage, change in vision, nasal discharge, CVS- denies chest pain, palpitations RESP- denies SOB, cough, wheeze MSK- +joint pain denies , muscle aches, injury Neuro- denies headache, dizziness, syncope, seizure activity       Objective:    BP 120/72   Pulse 81   Temp 98.3 F (36.8 C)   Resp 18   Ht 5\' 7"  (1.702 m)   Wt 151 lb 6.4 oz (68.7 kg)   SpO2 96%   BMI 23.71 kg/m  GEN- NAD, alert and oriented x3 CVS- RRR, no murmur RESP-CTAB MSK - right ankle swelling with swelling to mid foot positive squeeze test , fair ROM ankle, no warmth, no erythema Pulses- Radial, DP- 2+        Assessment & Plan:      Problem List Items Addressed This Visit    None    Visit Diagnoses    Acute right ankle pain    -  Primary   DD acute swelling, OA/ vs Gout, obtain xray, uric acid, ibuprofen 400mg  BID epson salt, elevate   Relevant Orders   Uric Acid   DG Foot Complete Right   Right ankle swelling       Relevant Orders   Uric Acid   DG Foot Complete Right      Note: This dictation was prepared with Dragon dictation along with smaller phrase technology. Any transcriptional errors that result from this process are unintentional.

## 2018-07-30 ENCOUNTER — Ambulatory Visit (HOSPITAL_COMMUNITY)
Admission: RE | Admit: 2018-07-30 | Discharge: 2018-07-30 | Disposition: A | Discharge status: Home / Self Care | Payer: Medicare Other | Source: Ambulatory Visit | Attending: Family Medicine | Admitting: Family Medicine

## 2018-07-30 DIAGNOSIS — M25571 Pain in right ankle and joints of right foot: Secondary | ICD-10-CM | POA: Diagnosis not present

## 2018-07-30 DIAGNOSIS — M25471 Effusion, right ankle: Secondary | ICD-10-CM | POA: Diagnosis not present

## 2018-07-30 DIAGNOSIS — M19071 Primary osteoarthritis, right ankle and foot: Secondary | ICD-10-CM | POA: Diagnosis not present

## 2018-07-30 LAB — URIC ACID: Uric Acid, Serum: 4.9 mg/dL (ref 2.5–7.0)

## 2018-08-04 DIAGNOSIS — M79671 Pain in right foot: Secondary | ICD-10-CM | POA: Diagnosis not present

## 2018-08-04 DIAGNOSIS — M7989 Other specified soft tissue disorders: Secondary | ICD-10-CM | POA: Diagnosis not present

## 2018-08-12 DIAGNOSIS — M79671 Pain in right foot: Secondary | ICD-10-CM | POA: Diagnosis not present

## 2018-08-18 DIAGNOSIS — M84371A Stress fracture, right ankle, initial encounter for fracture: Secondary | ICD-10-CM | POA: Diagnosis not present

## 2018-08-18 DIAGNOSIS — M79671 Pain in right foot: Secondary | ICD-10-CM | POA: Diagnosis not present

## 2018-08-25 DIAGNOSIS — Z961 Presence of intraocular lens: Secondary | ICD-10-CM | POA: Diagnosis not present

## 2018-08-25 DIAGNOSIS — H353132 Nonexudative age-related macular degeneration, bilateral, intermediate dry stage: Secondary | ICD-10-CM | POA: Diagnosis not present

## 2018-08-25 DIAGNOSIS — H43812 Vitreous degeneration, left eye: Secondary | ICD-10-CM | POA: Diagnosis not present

## 2018-09-09 ENCOUNTER — Ambulatory Visit (INDEPENDENT_AMBULATORY_CARE_PROVIDER_SITE_OTHER): Payer: Medicare Other | Admitting: Family Medicine

## 2018-09-09 ENCOUNTER — Encounter: Payer: Self-pay | Admitting: Family Medicine

## 2018-09-09 ENCOUNTER — Other Ambulatory Visit: Payer: Self-pay

## 2018-09-09 VITALS — BP 142/80 | HR 70 | Temp 98.6°F | Resp 16 | Ht 67.0 in | Wt 150.0 lb

## 2018-09-09 DIAGNOSIS — M654 Radial styloid tenosynovitis [de Quervain]: Secondary | ICD-10-CM

## 2018-09-09 MED ORDER — PREDNISONE 20 MG PO TABS: ORAL_TABLET | ORAL | 0 refills | Status: DC

## 2018-09-09 NOTE — Progress Notes (Signed)
Subjective:    Patient ID: Lauren House, female    DOB: 1935-09-14, 83 y.o.   MRN: [id number]  HPI  Patient is a very pleasant 83 year old Caucasian female who presents today complaining of pain in her left wrist.  She had had similar pain back in March and we obtained an x-ray which showed arthritis at the first [facility] joint.  However she is now having significant pain and even some mild erythema and swelling at the distal radius.  She is extremely tender to palpation in that area.  She has a positive Finkelstein maneuver.  The pain radiates up and down along the distal radius and along the first metacarpal bone.  She has minimal pain in the anatomic snuffbox.  She is able to flex and extend her wrist without significant pain but Finkelstein's maneuver causes severe pain.  She also complains of some numbness isolated to her left leg.  She reports some heaviness in her left leg.  She has severe varicose veins in her left leg and believes that these may be contributing some to the heaviness and numbness in the leg.  She denies any leg weakness.  She denies any back pain. Past Medical History:  Diagnosis Date  . Anxiety   . Apical variant hypertrophic cardiomyopathy ([place])   . Arthritis   . Borderline diabetes mellitus   . Bruises easily   . Bursitis of left hip   . Coronary atherosclerosis of native coronary artery    a. 2011: Cath showing nonobstructive CAD - 50-60% LAD (normal FFR) b. 06/2015: Cath showing 80% stenosis in the Mid LAD, tx w/ Promus DES 2.25 mm x 16 mm   . Essential hypertension, benign   . History of kidney stones   . History of skin cancer    Basal cell - abdomen  . Hyperlipidemia    Diet controlled.cannot take meds  . Nocturia   . Osteoporosis   . Overactive bladder   . Shortness of breath dyspnea   . Vertigo    Past Surgical History:  Procedure Laterality Date  . ABDOMINAL HYSTERECTOMY    . CARDIAC CATHETERIZATION N/A 07/22/2015   Procedure: Left Heart Cath and  Coronary Angiography;  Surgeon: Leonie Man, MD;  Location: [place] [facility];  Service: Cardiovascular;  Laterality: N/A;  . CARDIAC CATHETERIZATION N/A 07/22/2015   Procedure: Coronary Stent Intervention;  Surgeon: Leonie Man, MD;  Location: [place] [facility];  Service: Cardiovascular;  Laterality: N/A;  . CORONARY STENT PLACEMENT  07/22/2015   mid lad  des   . EYE SURGERY     cataracts bilateral  . HEMORRHOID SURGERY    . INGUINAL HERNIA REPAIR  10/11/2011   Procedure: HERNIA REPAIR INGUINAL ADULT;  Surgeon: Stark Klein, MD;  Location: [facility];  Service: General;  Laterality: Right;  . OPEN SURGICAL REPAIR OF GLUTEAL TENDON Left 03/12/2014   Procedure: LEFT HIP BURSECTOMY AND GLUTEAL TENDON REPAIR;  Surgeon: [name], MD;  Location: [facility];  Service: Orthopedics;  Laterality: Left;   Current Outpatient Medications on File Prior to Visit  Medication Sig Dispense Refill  . aspirin EC 81 MG tablet Take 81 mg by mouth daily.    . Cyanocobalamin (VITAMIN B12 PO) Take 1 tablet by mouth daily.    . diclofenac sodium (VOLTAREN) 1 % GEL Apply 2 g topically 4 (four) times daily. 100 g 1  . fluticasone (FLONASE) 50 MCG/ACT nasal spray USE TWO SPRAY IN EACH NOSTRIL EVERY DAY  50 g 1  . hydrocortisone cream 1 % Apply 1 application topically 2 (two) times daily. (Patient taking differently: Apply 1 application at bedtime topically. ) 30 g 0  . metoprolol tartrate (LOPRESSOR) 25 MG tablet TAKE 1/2 (ONE-HALF) TABLET BY MOUTH TWICE DAILY 30 tablet 9  . Multiple Vitamins-Minerals (OCUVITE PO) Take 1 tablet by mouth daily.    . nitroGLYCERIN (NITROSTAT) 0.4 MG SL tablet Place 1 tablet (0.4 mg total) under the tongue every 5 (five) minutes as needed for chest pain. 25 tablet 3  . Polyethyl Glycol-Propyl Glycol (SYSTANE OP) Apply 1 drop to eye 3 (three) times daily as needed (Dry eyes).    . ranolazine (RANEXA) 500 MG 12 hr tablet Take 1 tablet (500 mg total) by mouth 2 (two) times daily. 180  tablet 3  . senna-docusate (SENOKOT-S) 8.6-50 MG tablet Take 1 tablet by mouth at bedtime as needed for mild constipation or moderate constipation. 20 tablet 0  . triamcinolone ointment (KENALOG) 0.1 % Apply 1 application topically 2 (two) times daily. 30 g 0   No current facility-administered medications on file prior to visit.    Allergies  Allergen Reactions  . Chlorhexidine Itching  . Vancomycin Hives  . Aspirin Other (See Comments)    Nose bleeds with full strength  . Atorvastatin Other (See Comments)    Muscle pain  . Bactrim [Sulfamethoxazole-Trimethoprim] Other (See Comments)    Doesn't remember   . Cephalosporins Hives and Itching  . Ciprofloxacin Nausea Only  . Clarithromycin Other (See Comments)    Doesn't remember   . Colestipol Hcl Other (See Comments)    Hallucinations  . Ezetimibe Other (See Comments)    GI upset  . Prednisone   . Simvastatin Other (See Comments)    Muscle spasms  . Penicillins Rash    Has patient had a PCN reaction causing immediate rash, facial/tongue/throat swelling, SOB or lightheadedness with hypotension: yes Has patient had a PCN reaction causing severe rash involving mucus membranes or skin necrosis: no Has patient had a PCN reaction that required hospitalization no Has patient had a PCN reaction occurring within the last 10 years: no If all of the above answers are "NO", then may proceed with Cephalosporin use.   . Tape Rash   Social History   Socioeconomic History  . Marital status: Widowed    Spouse name: Not on file  . Number of children: Not on file  . Years of education: Not on file  . Highest education level: Not on file  Occupational History  . Not on file  Social Needs  . Financial resource strain: Not on file  . Food insecurity    Worry: Not on file    Inability: Not on file  . Transportation needs    Medical: Not on file    Non-medical: Not on file  Tobacco Use  . Smoking status: Never Smoker  . Smokeless  tobacco: Never Used  Substance and Sexual Activity  . Alcohol use: No  . Drug use: No  . Sexual activity: Never  Lifestyle  . Physical activity    Days per week: Not on file    Minutes per session: Not on file  . Stress: Not on file  Relationships  . Social Herbalist on phone: Not on file    Gets together: Not on file    Attends religious service: Not on file    Active member of club or organization: Not on file  Attends meetings of clubs or organizations: Not on file    Relationship status: Not on file  . Intimate partner violence    Fear of current or ex partner: Not on file    Emotionally abused: Not on file    Physically abused: Not on file    Forced sexual activity: Not on file  Other Topics Concern  . Not on file  Social History Narrative  . Not on file     Review of Systems  All other systems reviewed and are negative.      Objective:   Physical Exam Vitals signs reviewed.  Constitutional:      Appearance: Normal appearance.  Cardiovascular:     Rate and Rhythm: Normal rate and regular rhythm.     Heart sounds: Normal heart sounds.  Pulmonary:     Effort: Pulmonary effort is normal.     Breath sounds: Normal breath sounds.  Musculoskeletal:     Left wrist: She exhibits decreased range of motion, tenderness, bony tenderness and swelling. She exhibits no crepitus, no deformity and no laceration.  Neurological:     Mental Status: She is alert.           Assessment & Plan:  I believe the patient may have de Quervain's tenosynovitis.  Recommended a prednisone taper pack as well as a thumb spica splint for the next 2 weeks and then reassess.  If no better recommended x-rays and repeat imaging of the wrist.  Also consider autoimmune arthritides given the swelling and warmth in the joint

## 2018-10-24 ENCOUNTER — Encounter: Payer: Self-pay | Admitting: Family Medicine

## 2018-10-24 ENCOUNTER — Other Ambulatory Visit: Payer: Self-pay

## 2018-10-24 ENCOUNTER — Other Ambulatory Visit: Payer: Self-pay | Admitting: Family Medicine

## 2018-10-24 ENCOUNTER — Ambulatory Visit (INDEPENDENT_AMBULATORY_CARE_PROVIDER_SITE_OTHER): Payer: Medicare Other | Admitting: Family Medicine

## 2018-10-24 VITALS — BP 146/80 | HR 80 | Temp 98.4°F | Resp 16 | Ht 67.0 in | Wt 149.0 lb

## 2018-10-24 DIAGNOSIS — M654 Radial styloid tenosynovitis [de Quervain]: Secondary | ICD-10-CM

## 2018-10-24 DIAGNOSIS — M659 Synovitis and tenosynovitis, unspecified: Secondary | ICD-10-CM

## 2018-10-24 NOTE — Progress Notes (Signed)
Subjective:    Patient ID: Lauren House, female    DOB: 02/19/1936, 83 y.o.   MRN: [id number]  HPI 09/09/18 Patient is a very pleasant 83 year old Caucasian female who presents today complaining of pain in her left wrist.  She had had similar pain back in March and we obtained an x-ray which showed arthritis at the first [place] joint.  However she is now having significant pain and even some mild erythema and swelling at the distal radius.  She is extremely tender to palpation in that area.  She has a positive Finkelstein maneuver.  The pain radiates up and down along the distal radius and along the first metacarpal bone.  She has minimal pain in the anatomic snuffbox.  She is able to flex and extend her wrist without significant pain but Finkelstein's maneuver causes severe pain.  She also complains of some numbness isolated to her left leg.  She reports some heaviness in her left leg.  She has severe varicose veins in her left leg and believes that these may be contributing some to the heaviness and numbness in the leg.  She denies any leg weakness.  She denies any back pain.  At that time, my plan was: I believe the patient may have de Quervain's tenosynovitis.  Recommended a prednisone taper pack as well as a thumb spica splint for the next 2 weeks and then reassess.  If no better recommended x-rays and repeat imaging of the wrist.  Also consider autoimmune arthritides given the swelling and warmth in the joint  10/24/18   Patient saw very little benefit from the prednisone I gave her last time.  Today there is visible swelling in her distal left wrist.  I tried to demonstrate this with the photo above.  The skin there is also warm to the touch and slightly erythematous.  She is exquisitely tender to palpation over the extensor tendon running over the radial styloid process down the length of and up to the mid forearm.  She is tender all along this tendon.  She has a positive Finkelstein maneuver.   However she is also tender in the anatomic snuffbox.  The joint is swollen.  She has no pain with flexion or extension of the wrist but ulnar deviation creates pain and radial deviation creates pain. Past Medical History:  Diagnosis Date  . Anxiety   . Apical variant hypertrophic cardiomyopathy ([place])   . Arthritis   . Borderline diabetes mellitus   . Bruises easily   . Bursitis of left hip   . Coronary atherosclerosis of native coronary artery    a. 2011: Cath showing nonobstructive CAD - 50-60% LAD (normal FFR) b. 06/2015: Cath showing 80% stenosis in the Mid LAD, tx w/ Promus DES 2.25 mm x 16 mm   . Essential hypertension, benign   . History of kidney stones   . History of skin cancer    Basal cell - abdomen  . Hyperlipidemia    Diet controlled.cannot take meds  . Nocturia   . Osteoporosis   . Overactive bladder   . Shortness of breath dyspnea   . Vertigo    Past Surgical History:  Procedure Laterality Date  . ABDOMINAL HYSTERECTOMY    . CARDIAC CATHETERIZATION N/A 07/22/2015   Procedure: Left Heart Cath and Coronary Angiography;  Surgeon: Leonie Man, MD;  Location: [facility];  Service: Cardiovascular;  Laterality: N/A;  . CARDIAC CATHETERIZATION N/A 07/22/2015   Procedure: Coronary Stent Intervention;  Surgeon: Leonie Man, MD;  Location: [facility];  Service: Cardiovascular;  Laterality: N/A;  . CORONARY STENT PLACEMENT  07/22/2015   mid lad  des   . EYE SURGERY     cataracts bilateral  . HEMORRHOID SURGERY    . INGUINAL HERNIA REPAIR  10/11/2011   Procedure: HERNIA REPAIR INGUINAL ADULT;  Surgeon: Stark Klein, MD;  Location: [facility];  Service: General;  Laterality: Right;  . OPEN SURGICAL REPAIR OF GLUTEAL TENDON Left 03/12/2014   Procedure: LEFT HIP BURSECTOMY AND GLUTEAL TENDON REPAIR;  Surgeon: [name], MD;  Location: [facility];  Service: Orthopedics;  Laterality: Left;   Current Outpatient Medications on File Prior to Visit  Medication Sig  Dispense Refill  . aspirin EC 81 MG tablet Take 81 mg by mouth daily.    . Cyanocobalamin (VITAMIN B12 PO) Take 1 tablet by mouth daily.    . diclofenac sodium (VOLTAREN) 1 % GEL Apply 2 g topically 4 (four) times daily. 100 g 1  . fluticasone (FLONASE) 50 MCG/ACT nasal spray USE TWO SPRAY IN EACH NOSTRIL EVERY DAY 50 g 1  . hydrocortisone cream 1 % Apply 1 application topically 2 (two) times daily. (Patient taking differently: Apply 1 application at bedtime topically. ) 30 g 0  . metoprolol tartrate (LOPRESSOR) 25 MG tablet TAKE 1/2 (ONE-HALF) TABLET BY MOUTH TWICE DAILY 30 tablet 9  . Multiple Vitamins-Minerals (OCUVITE PO) Take 1 tablet by mouth daily.    . nitroGLYCERIN (NITROSTAT) 0.4 MG SL tablet Place 1 tablet (0.4 mg total) under the tongue every 5 (five) minutes as needed for chest pain. 25 tablet 3  . Polyethyl Glycol-Propyl Glycol (SYSTANE OP) Apply 1 drop to eye 3 (three) times daily as needed (Dry eyes).    . ranolazine (RANEXA) 500 MG 12 hr tablet Take 1 tablet (500 mg total) by mouth 2 (two) times daily. 180 tablet 3  . senna-docusate (SENOKOT-S) 8.6-50 MG tablet Take 1 tablet by mouth at bedtime as needed for mild constipation or moderate constipation. 20 tablet 0  . triamcinolone ointment (KENALOG) 0.1 % Apply 1 application topically 2 (two) times daily. 30 g 0   No current facility-administered medications on file prior to visit.    Allergies  Allergen Reactions  . Chlorhexidine Itching  . Vancomycin Hives  . Aspirin Other (See Comments)    Nose bleeds with full strength  . Atorvastatin Other (See Comments)    Muscle pain  . Bactrim [Sulfamethoxazole-Trimethoprim] Other (See Comments)    Doesn't remember   . Cephalosporins Hives and Itching  . Ciprofloxacin Nausea Only  . Clarithromycin Other (See Comments)    Doesn't remember   . Colestipol Hcl Other (See Comments)    Hallucinations  . Ezetimibe Other (See Comments)    GI upset  . Prednisone   . Simvastatin Other  (See Comments)    Muscle spasms  . Penicillins Rash    Has patient had a PCN reaction causing immediate rash, facial/tongue/throat swelling, SOB or lightheadedness with hypotension: yes Has patient had a PCN reaction causing severe rash involving mucus membranes or skin necrosis: no Has patient had a PCN reaction that required hospitalization no Has patient had a PCN reaction occurring within the last 10 years: no If all of the above answers are "NO", then may proceed with Cephalosporin use.   . Tape Rash   Social History   Socioeconomic History  . Marital status: Widowed    Spouse name: Not on  file  . Number of children: Not on file  . Years of education: Not on file  . Highest education level: Not on file  Occupational History  . Not on file  Social Needs  . Financial resource strain: Not on file  . Food insecurity    Worry: Not on file    Inability: Not on file  . Transportation needs    Medical: Not on file    Non-medical: Not on file  Tobacco Use  . Smoking status: Never Smoker  . Smokeless tobacco: Never Used  Substance and Sexual Activity  . Alcohol use: No  . Drug use: No  . Sexual activity: Never  Lifestyle  . Physical activity    Days per week: Not on file    Minutes per session: Not on file  . Stress: Not on file  Relationships  . Social Herbalist on phone: Not on file    Gets together: Not on file    Attends religious service: Not on file    Active member of club or organization: Not on file    Attends meetings of clubs or organizations: Not on file    Relationship status: Not on file  . Intimate partner violence    Fear of current or ex partner: Not on file    Emotionally abused: Not on file    Physically abused: Not on file    Forced sexual activity: Not on file  Other Topics Concern  . Not on file  Social History Narrative  . Not on file     Review of Systems  All other systems reviewed and are negative.      Objective:    Physical Exam Vitals signs reviewed.  Constitutional:      Appearance: Normal appearance.  Cardiovascular:     Rate and Rhythm: Normal rate and regular rhythm.     Heart sounds: Normal heart sounds.  Pulmonary:     Effort: Pulmonary effort is normal.     Breath sounds: Normal breath sounds.  Musculoskeletal:     Left wrist: She exhibits decreased range of motion, tenderness, bony tenderness and swelling. She exhibits no crepitus, no deformity and no laceration.  Neurological:     Mental Status: She is alert.           Assessment & Plan:  Tenosynovitis of wrist - Plan: CBC with Differential/Platelet, COMPLETE METABOLIC PANEL WITH GFR, Sedimentation rate, Rheumatoid factor, Uric acid  Patient has active tenosynovitis of the wrist.  I will begin by obtaining lab work to rule out autoimmune arthritides such as gout, rheumatoid arthritis, etc.  If blood work is normal, patient is unable to take NSAIDs, prednisone did not help, splints did not help, I would recommend orthopedic referral for cortisone injection for de Quervain's tenosynovitis.  If cortisone injection does not help I would recommend an MRI of the wrist to rule out an underlying stress fracture although original x-rays only showed arthritis

## 2018-10-25 LAB — CBC WITH DIFFERENTIAL/PLATELET
Absolute Monocytes: 631 cells/uL (ref 200–950)
Basophils Absolute: 58 cells/uL (ref 0–200)
Basophils Relative: 0.7 %
Eosinophils Absolute: 108 cells/uL (ref 15–500)
Eosinophils Relative: 1.3 %
HCT: 43.3 % (ref 35.0–45.0)
Hemoglobin: 14.2 g/dL (ref 11.7–15.5)
Lymphs Abs: 3212 cells/uL (ref 850–3900)
MCH: 28 pg (ref 27.0–33.0)
MCHC: 32.8 g/dL (ref 32.0–36.0)
MCV: 85.2 fL (ref 80.0–100.0)
MPV: 10.7 fL (ref 7.5–12.5)
Monocytes Relative: 7.6 %
Neutro Abs: 4291 cells/uL (ref 1500–7800)
Neutrophils Relative %: 51.7 %
Platelets: 267 10*3/uL (ref 140–400)
RBC: 5.08 10*6/uL (ref 3.80–5.10)
RDW: 13.4 % (ref 11.0–15.0)
Total Lymphocyte: 38.7 %
WBC: 8.3 10*3/uL (ref 3.8–10.8)

## 2018-10-25 LAB — COMPLETE METABOLIC PANEL WITH GFR
AG Ratio: 1.6 (calc) (ref 1.0–2.5)
ALT: 11 U/L (ref 6–29)
AST: 15 U/L (ref 10–35)
Albumin: 4.4 g/dL (ref 3.6–5.1)
Alkaline phosphatase (APISO): 55 U/L (ref 37–153)
BUN/Creatinine Ratio: 20 (calc) (ref 6–22)
BUN: 19 mg/dL (ref 7–25)
CO2: 26 mmol/L (ref 20–32)
Calcium: 9.6 mg/dL (ref 8.6–10.4)
Chloride: 104 mmol/L (ref 98–110)
Creat: 0.95 mg/dL — ABNORMAL HIGH (ref 0.60–0.88)
GFR, Est African American: 65 mL/min/{1.73_m2} (ref >=60)
GFR, Est Non African American: 56 mL/min/{1.73_m2} — ABNORMAL LOW (ref >=60)
Globulin: 2.8 g/dL (calc) (ref 1.9–3.7)
Glucose, Bld: 98 mg/dL (ref 65–99)
Potassium: 4.5 mmol/L (ref 3.5–5.3)
Sodium: 141 mmol/L (ref 135–146)
Total Bilirubin: 0.4 mg/dL (ref 0.2–1.2)
Total Protein: 7.2 g/dL (ref 6.1–8.1)

## 2018-10-25 LAB — URIC ACID: Uric Acid, Serum: 4.7 mg/dL (ref 2.5–7.0)

## 2018-10-25 LAB — SEDIMENTATION RATE: Sed Rate: 9 mm/h (ref 0–30)

## 2018-10-25 LAB — RHEUMATOID FACTOR: Rheumatoid fact SerPl-aCnc: <14 IU/mL (ref <14)

## 2018-12-03 DIAGNOSIS — M79641 Pain in right hand: Secondary | ICD-10-CM | POA: Insufficient documentation

## 2018-12-03 DIAGNOSIS — M79642 Pain in left hand: Secondary | ICD-10-CM | POA: Diagnosis not present

## 2018-12-03 DIAGNOSIS — M65832 Other synovitis and tenosynovitis, left forearm: Secondary | ICD-10-CM | POA: Diagnosis not present

## 2018-12-03 DIAGNOSIS — M1812 Unilateral primary osteoarthritis of first carpometacarpal joint, left hand: Secondary | ICD-10-CM | POA: Diagnosis not present

## 2018-12-03 DIAGNOSIS — G5601 Carpal tunnel syndrome, right upper limb: Secondary | ICD-10-CM | POA: Diagnosis not present

## 2018-12-10 ENCOUNTER — Telehealth: Payer: Self-pay | Admitting: Internal Medicine

## 2018-12-10 NOTE — Telephone Encounter (Signed)
Virtual Visit Pre-Appointment Phone Call  "(Name), I am calling you today to discuss your upcoming appointment. We are currently trying to limit exposure to the virus that causes COVID-19 by seeing patients at home rather than in the office."  1. "What is the BEST phone number to call the day of the visit?" - [phone]   2. "Do you have or have access to (through a family member/friend) a smartphone with video capability that we can use for your visit?" a. If yes - list this number in appt notes as "cell" (if different from BEST phone #) and list the appointment type as a VIDEO visit in appointment notes b. If no - list the appointment type as a PHONE visit in appointment notes  3. Confirm consent - "In the setting of the current Covid19 crisis, you are scheduled for a (phone or video) visit with your provider on (date) at (time).  Just as we do with many in-office visits, in order for you to participate in this visit, we must obtain consent.  If you'd like, I can send this to your mychart (if signed up) or email for you to review.  Otherwise, I can obtain your verbal consent now.  All virtual visits are billed to your insurance company just like a normal visit would be.  By agreeing to a virtual visit, we'd like you to understand that the technology does not allow for your provider to perform an examination, and thus may limit your provider's ability to fully assess your condition. If your provider identifies any concerns that need to be evaluated in person, we will make arrangements to do so.  Finally, though the technology is pretty good, we cannot assure that it will always work on either your or our end, and in the setting of a video visit, we may have to convert it to a phone-only visit.  In either situation, we cannot ensure that we have a secure connection.  Are you willing to proceed?" STAFF: Did the patient verbally acknowledge consent to telehealth visit? Document YES/NO here: YES  4.  Advise patient to be prepared - "Two hours prior to your appointment, go ahead and check your blood pressure, pulse, oxygen saturation, and your weight (if you have the equipment to check those) and write them all down. When your visit starts, your provider will ask you for this information. If you have an Apple Watch or Kardia device, please plan to have heart rate information ready on the day of your appointment. Please have a pen and paper handy nearby the day of the visit as well."   5. Inform patient they will receive a phone call 15 minutes prior to their appointment time (may be from unknown caller ID) so they should be prepared to answer    TELEPHONE CALL NOTE  Lauren House has been deemed a candidate for a follow-up tele-health visit to limit community exposure during the Covid-19 pandemic. I spoke with the patient via phone to ensure availability of phone/video source, confirm preferred email & phone number, and discuss instructions and expectations.  I reminded Lauren House to be prepared with any vital sign and/or heart rhythm information that could potentially be obtained via home monitoring, at the time of her visit. I reminded Lauren House to expect a phone call prior to her visit.  Lauren Levy, RN 12/10/2018 12:02 PM      FULL LENGTH CONSENT FOR TELE-HEALTH VISIT   I hereby voluntarily  request, consent and authorize [facility] and its employed or contracted physicians, physician assistants, nurse practitioners or other licensed health care professionals (the Practitioner), to provide me with telemedicine health care services (the "Services") as deemed necessary by the treating Practitioner. I acknowledge and consent to receive the Services by the Practitioner via telemedicine. I understand that the telemedicine visit will involve communicating with the Practitioner through live audiovisual communication technology and the disclosure of certain medical information by  electronic transmission. I acknowledge that I have been given the opportunity to request an in-person assessment or other available alternative prior to the telemedicine visit and am voluntarily participating in the telemedicine visit.  I understand that I have the right to withhold or withdraw my consent to the use of telemedicine in the course of my care at any time, without affecting my right to future care or treatment, and that the Practitioner or I may terminate the telemedicine visit at any time. I understand that I have the right to inspect all information obtained and/or recorded in the course of the telemedicine visit and may receive copies of available information for a reasonable fee.  I understand that some of the potential risks of receiving the Services via telemedicine include:  Marland Kitchen Delay or interruption in medical evaluation due to technological equipment failure or disruption; . Information transmitted may not be sufficient (e.g. poor resolution of images) to allow for appropriate medical decision making by the Practitioner; and/or  . In rare instances, security protocols could fail, causing a breach of personal health information.  Furthermore, I acknowledge that it is my responsibility to provide information about my medical history, conditions and care that is complete and accurate to the best of my ability. I acknowledge that Practitioner's advice, recommendations, and/or decision may be based on factors not within their control, such as incomplete or inaccurate data provided by me or distortions of diagnostic images or specimens that may result from electronic transmissions. I understand that the practice of medicine is not an exact science and that Practitioner makes no warranties or guarantees regarding treatment outcomes. I acknowledge that I will receive a copy of this consent concurrently upon execution via email to the email address I last provided but may also request a printed  copy by calling the office of [place].    I understand that my insurance will be billed for this visit.   I have read or had this consent read to me. . I understand the contents of this consent, which adequately explains the benefits and risks of the Services being provided via telemedicine.  . I have been provided ample opportunity to ask questions regarding this consent and the Services and have had my questions answered to my satisfaction. . I give my informed consent for the services to be provided through the use of telemedicine in my medical care  By participating in this telemedicine visit I agree to the above.

## 2018-12-18 ENCOUNTER — Telehealth (INDEPENDENT_AMBULATORY_CARE_PROVIDER_SITE_OTHER): Payer: Medicare Other | Admitting: Internal Medicine

## 2018-12-18 ENCOUNTER — Encounter: Payer: Self-pay | Admitting: Internal Medicine

## 2018-12-18 VITALS — BP 138/68 | HR 76 | Ht 67.0 in | Wt 146.0 lb

## 2018-12-18 DIAGNOSIS — I422 Other hypertrophic cardiomyopathy: Secondary | ICD-10-CM

## 2018-12-18 DIAGNOSIS — E782 Mixed hyperlipidemia: Secondary | ICD-10-CM

## 2018-12-18 DIAGNOSIS — I2510 Atherosclerotic heart disease of native coronary artery without angina pectoris: Secondary | ICD-10-CM | POA: Diagnosis not present

## 2018-12-18 DIAGNOSIS — Z9861 Coronary angioplasty status: Secondary | ICD-10-CM

## 2018-12-18 DIAGNOSIS — R0600 Dyspnea, unspecified: Secondary | ICD-10-CM

## 2018-12-18 DIAGNOSIS — I2583 Coronary atherosclerosis due to lipid rich plaque: Secondary | ICD-10-CM

## 2018-12-18 DIAGNOSIS — R0609 Other forms of dyspnea: Secondary | ICD-10-CM

## 2018-12-18 DIAGNOSIS — I10 Essential (primary) hypertension: Secondary | ICD-10-CM | POA: Diagnosis not present

## 2018-12-18 DIAGNOSIS — R42 Dizziness and giddiness: Secondary | ICD-10-CM

## 2018-12-18 NOTE — Progress Notes (Signed)
Virtual Visit via Telephone Note   This visit type was conducted due to national recommendations for restrictions regarding the COVID-19 Pandemic (e.g. social distancing) in an effort to limit this patient's exposure and mitigate transmission in our community.  Due to her co-morbid illnesses, this patient is at least at moderate risk for complications without adequate follow up.  This format is felt to be most appropriate for this patient at this time.  The patient did not have access to video technology/had technical difficulties with video requiring transitioning to audio format only (telephone).  All issues noted in this document were discussed and addressed.  No physical exam could be performed with this format.  Please refer to the patient's chart for her  consent to telehealth for [facility].   Evaluation Performed:  Telephone follow-up  Date:  12/18/2018   ID:  Lauren House, DOB [birth year], MRN [id number]  Patient Location:  [place] [address]  Provider location:   [address] [place], [place] [address]  PCP:  Susy Frizzle, MD  Cardiologist:  No primary care provider on file. Electrophysiologist:  None   Chief Complaint:  Diziness  History of Present Illness:    Lauren House is a 83 y.o. female who presents via audio/video conferencing for a telehealth visit today.  Lauren House is seen today for telephone follow-up.  She is an 83 year old female with a history of apical variant cardiomyopathy as well as coronary artery disease status post PCI to the LAD in 2017.  Her main complaint is dizziness.  She says this has been going on for some time.  She feels like it is the Ranexa.  She takes it in the morning and on days where she goes to church she notes that some people think she is acting like she is intoxicated.  Apparently this is a fairly new development however she has been on the Ranexa for some time.  Previously she had been on is  much as 1000 twice a day however the dose had been decreased to 500 twice a day and now she is taking only 500 once a day.  I am not certain that it is the Ranexa however it is reasonable to consider a trial off of the medication to see if it improves her symptoms.  She denies any angina.  She has shortness of breath which is been stable and unchanged.  Her last cardiac cath was in 2017 which showed a 80% mid LAD stenosis which was treated with a Promus drug-eluting stent.  Otherwise no significant obstructive coronary disease.  She has been statin intolerant but on willing to try other options for her elevated LDL.  The patient does not have symptoms concerning for COVID-19 infection (fever, chills, cough, or new SHORTNESS OF BREATH).    Prior CV studies:   The following studies were reviewed today:  Chart reviewed Lab work  PMHx:  Past Medical History:  Diagnosis Date   Anxiety    Apical variant hypertrophic cardiomyopathy ([place])    Arthritis    Borderline diabetes mellitus    Bruises easily    Bursitis of left hip    Coronary atherosclerosis of native coronary artery    a. 2011: Cath showing nonobstructive CAD - 50-60% LAD (normal FFR) b. 06/2015: Cath showing 80% stenosis in the Mid LAD, tx w/ Promus DES 2.25 mm x 16 mm    Essential hypertension, benign    History of kidney stones  History of skin cancer    Basal cell - abdomen   Hyperlipidemia    Diet controlled.cannot take meds   Nocturia    Osteoporosis    Overactive bladder    Shortness of breath dyspnea    Vertigo     Past Surgical History:  Procedure Laterality Date   ABDOMINAL HYSTERECTOMY     CARDIAC CATHETERIZATION N/A 07/22/2015   Procedure: Left Heart Cath and Coronary Angiography;  Surgeon: Leonie Man, MD;  Location: [place] [facility];  Service: Cardiovascular;  Laterality: N/A;   CARDIAC CATHETERIZATION N/A 07/22/2015   Procedure: Coronary Stent Intervention;  Surgeon: Leonie Man,  MD;  Location: [facility];  Service: Cardiovascular;  Laterality: N/A;   CORONARY STENT PLACEMENT  07/22/2015   mid lad  des    EYE SURGERY     cataracts bilateral   HEMORRHOID SURGERY     INGUINAL HERNIA REPAIR  10/11/2011   Procedure: HERNIA REPAIR INGUINAL ADULT;  Surgeon: Stark Klein, MD;  Location: [facility];  Service: General;  Laterality: Right;   OPEN SURGICAL REPAIR OF GLUTEAL TENDON Left 03/12/2014   Procedure: LEFT HIP BURSECTOMY AND GLUTEAL TENDON REPAIR;  Surgeon: [name], MD;  [facility];  Service: Orthopedics;  Laterality: Left;    FAMHx:  Family History  Problem Relation Age of Onset   Heart disease Father    Lung cancer Sister    Lung cancer Brother     SOCHx:   reports that she has never smoked. She has never used smokeless tobacco. She reports that she does not drink alcohol or use drugs.  ALLERGIES:  Allergies  Allergen Reactions   Chlorhexidine Itching   Vancomycin Hives   Aspirin Other (See Comments)    Nose bleeds with full strength   Atorvastatin Other (See Comments)    Muscle pain   Bactrim [Sulfamethoxazole-Trimethoprim] Other (See Comments)    Doesn't remember    Cephalosporins Hives and Itching   Ciprofloxacin Nausea Only   Clarithromycin Other (See Comments)    Doesn't remember    Colestipol Hcl Other (See Comments)    Hallucinations   Ezetimibe Other (See Comments)    GI upset   Prednisone    Simvastatin Other (See Comments)    Muscle spasms   Penicillins Rash    Has patient had a PCN reaction causing immediate rash, facial/tongue/throat swelling, SOB or lightheadedness with hypotension: yes Has patient had a PCN reaction causing severe rash involving mucus membranes or skin necrosis: no Has patient had a PCN reaction that required hospitalization no Has patient had a PCN reaction occurring within the last 10 years: no If all of the above answers are "NO", then may proceed with Cephalosporin use.      Tape Rash    MEDS:  Current Meds  Medication Sig   aspirin EC 81 MG tablet Take 81 mg by mouth daily.   Cyanocobalamin (VITAMIN B12 PO) Take 1 tablet by mouth daily.   diclofenac sodium (VOLTAREN) 1 % GEL Apply 2 g topically 4 (four) times daily.   fluticasone (FLONASE) 50 MCG/ACT nasal spray USE TWO SPRAY IN EACH NOSTRIL EVERY DAY   hydrocortisone cream 1 % Apply 1 application topically 2 (two) times daily. (Patient taking differently: Apply 1 application at bedtime topically. )   metoprolol tartrate (LOPRESSOR) 25 MG tablet TAKE 1/2 (ONE-HALF) TABLET BY MOUTH TWICE DAILY   Multiple Vitamins-Minerals (OCUVITE PO) Take 1 tablet by mouth daily.   nitroGLYCERIN (NITROSTAT)  0.4 MG SL tablet Place 1 tablet (0.4 mg total) under the tongue every 5 (five) minutes as needed for chest pain.   Polyethyl Glycol-Propyl Glycol (SYSTANE OP) Apply 1 drop to eye 3 (three) times daily as needed (Dry eyes).   ranolazine (RANEXA) 500 MG 12 hr tablet Take 500 mg by mouth daily.   senna-docusate (SENOKOT-S) 8.6-50 MG tablet Take 1 tablet by mouth at bedtime as needed for mild constipation or moderate constipation.   triamcinolone ointment (KENALOG) 0.1 % Apply 1 application topically 2 (two) times daily.     ROS: Pertinent items noted in HPI and remainder of comprehensive ROS otherwise negative.  Labs/Other Tests and Data Reviewed:    Recent Labs: 10/24/2018: ALT 11; BUN 19; Creat 0.95; Hemoglobin 14.2; Platelets 267; Potassium 4.5; Sodium 141   Recent Lipid Panel Lab Results  Component Value Date/Time   CHOL 300 (H) 11/05/2016 09:45 AM   TRIG 249 (H) 11/05/2016 09:45 AM   HDL 46 (L) 11/05/2016 09:45 AM   CHOLHDL 6.5 (H) 11/05/2016 09:45 AM   LDLCALC 210 (H) 11/05/2016 09:45 AM    Wt Readings from Last 3 Encounters:  12/18/18 146 lb (66.2 kg)  10/24/18 149 lb (67.6 kg)  09/09/18 150 lb (68 kg)     Exam:    Vital Signs:  BP 138/68    Pulse 76    Ht 5\' 7"  (1.702 m)    Wt 146 lb  (66.2 kg)    BMI 22.87 kg/m    Exam not performed due to telephone visit  ASSESSMENT & PLAN:    1. History of DOE - echo with normal systolic function, mild diastolic dysfunction and elevated LVEDP  2. Unstable angina - status post PCI with a Promus DES (2.25 mm x 16 mm to the mid LAD)- known 50-60% ostial LAD stenosis (FFR 0.99 in 2011) which was stable at repeat cath (06/2015) 3. Apical variant hypertrophic cardimyopathy - anterolateral TWI's - negative nuclear stress test 01-2015 4. Hypertension 5. Venous insufficiency 6. Right inguinal pain with a hernia, status post surgical repair 7. Right hip pain with degenerative hip disease - s/p surgery 8. Statin intolerance with high LDL  Ms. Browers has stable dyspnea on exertion.  She denies any new chest pain.  She reports dizziness which she feels is related to her Ranexa and we will trial a period off of it to see if it improves.  Blood pressure is reasonably well controlled.  Otherwise her complaints are mostly with arthralgias and pain in her hands and her feet for which she developed a fracture.  She continues to be active and mows her yard.  She had been statin intolerance with elevated LDL.  We discussed alternative therapies including PCSK9 inhibitors which she was not interested in.  We may need to revisit this or consider Nexletol or some other option however cost of medications has been an issue including the Ranexa which she says is more expensive as a generic and then it was as a brand-name medication.   COVID-19 Education: The signs and symptoms of COVID-19 were discussed with the patient and how to seek care for testing (follow up with PCP or arrange E-visit).  The importance of social distancing was discussed today.  Patient Risk:   After full review of this patients clinical status, I feel that they are at least moderate risk at this time.  Time:   Today, I have spent 25 minutes with the patient with telehealth technology  discussing dyspnea,  hypertension, statin intolerance, apical variant hypertrophic cardiomyopathy, dizziness.     Medication Adjustments/Labs and Tests Ordered: Current medicines are reviewed at length with the patient today.  Concerns regarding medicines are outlined above.   Tests Ordered: No orders of the defined types were placed in this encounter.   Medication Changes: No orders of the defined types were placed in this encounter.   Disposition:  in 1 year(s)  [name], MD, [facility], [place] Director of the Advanced Lipid Disorders &  Cardiovascular Risk Reduction Clinic Diplomate of the American Board of Clinical Lipidology Attending Cardiologist  Direct Dial: [phone]   Fax: [phone]  Website:  [email or web address]  [name], MD  12/18/2018 8:36 AM

## 2018-12-18 NOTE — Patient Instructions (Signed)
Medication Instructions:  STOP ranexa for a short period of time to see if symptoms improve. If no difference, please resume ranexa 500mg  daily Continue other current medications   *If you need a refill on your cardiac medications before your next appointment, please call your pharmacy*  Follow-Up: At [facility], you and your health needs are our priority.  As part of our continuing mission to provide you with exceptional heart care, we have created designated Provider Care Teams.  These Care Teams include your primary Cardiologist (physician) and Advanced Practice Providers (APPs -  Physician Assistants and Nurse Practitioners) who all work together to provide you with the care you need, when you need it.  Your next appointment:   12 months  The format for your next appointment:   In Person  Provider:   K. Mali Hilty, MD  Other Instructions

## 2018-12-23 ENCOUNTER — Ambulatory Visit (INDEPENDENT_AMBULATORY_CARE_PROVIDER_SITE_OTHER): Payer: Medicare Other

## 2018-12-23 DIAGNOSIS — Z23 Encounter for immunization: Secondary | ICD-10-CM | POA: Diagnosis not present

## 2019-02-04 DIAGNOSIS — G5601 Carpal tunnel syndrome, right upper limb: Secondary | ICD-10-CM | POA: Diagnosis not present

## 2019-02-04 DIAGNOSIS — M65341 Trigger finger, right ring finger: Secondary | ICD-10-CM | POA: Diagnosis not present

## 2019-03-09 DIAGNOSIS — D044 Carcinoma in situ of skin of scalp and neck: Secondary | ICD-10-CM | POA: Diagnosis not present

## 2019-03-09 DIAGNOSIS — L821 Other seborrheic keratosis: Secondary | ICD-10-CM | POA: Diagnosis not present

## 2019-03-17 ENCOUNTER — Emergency Department (HOSPITAL_COMMUNITY)
Admission: EM | Admit: 2019-03-17 | Discharge: 2019-03-17 | Disposition: A | Discharge status: Home / Self Care | Payer: Medicare Other | Attending: Emergency Medicine | Admitting: Emergency Medicine

## 2019-03-17 ENCOUNTER — Emergency Department (HOSPITAL_COMMUNITY): Payer: Medicare Other

## 2019-03-17 ENCOUNTER — Encounter (HOSPITAL_COMMUNITY): Payer: Self-pay | Admitting: Emergency Medicine

## 2019-03-17 DIAGNOSIS — Z85828 Personal history of other malignant neoplasm of skin: Secondary | ICD-10-CM | POA: Insufficient documentation

## 2019-03-17 DIAGNOSIS — R42 Dizziness and giddiness: Secondary | ICD-10-CM | POA: Diagnosis not present

## 2019-03-17 DIAGNOSIS — Z7982 Long term (current) use of aspirin: Secondary | ICD-10-CM | POA: Diagnosis not present

## 2019-03-17 DIAGNOSIS — I25720 Atherosclerosis of autologous artery coronary artery bypass graft(s) with unstable angina pectoris: Secondary | ICD-10-CM | POA: Diagnosis not present

## 2019-03-17 DIAGNOSIS — Z79899 Other long term (current) drug therapy: Secondary | ICD-10-CM | POA: Insufficient documentation

## 2019-03-17 DIAGNOSIS — T68XXXA Hypothermia, initial encounter: Secondary | ICD-10-CM | POA: Diagnosis not present

## 2019-03-17 DIAGNOSIS — R531 Weakness: Secondary | ICD-10-CM | POA: Diagnosis not present

## 2019-03-17 DIAGNOSIS — I10 Essential (primary) hypertension: Secondary | ICD-10-CM | POA: Diagnosis not present

## 2019-03-17 LAB — CBC WITH DIFFERENTIAL/PLATELET
Abs Immature Granulocytes: 0.03 10*3/uL (ref 0.00–0.07)
Basophils Absolute: 0 10*3/uL (ref 0.0–0.1)
Basophils Relative: 1 %
Eosinophils Absolute: 0.1 10*3/uL (ref 0.0–0.5)
Eosinophils Relative: 1 %
HCT: 45.1 % (ref 36.0–46.0)
Hemoglobin: 14.3 g/dL (ref 12.0–15.0)
Immature Granulocytes: 0 %
Lymphocytes Relative: 37 %
Lymphs Abs: 3.1 10*3/uL (ref 0.7–4.0)
MCH: 28.3 pg (ref 26.0–34.0)
MCHC: 31.7 g/dL (ref 30.0–36.0)
MCV: 89.1 fL (ref 80.0–100.0)
Monocytes Absolute: 0.6 10*3/uL (ref 0.1–1.0)
Monocytes Relative: 7 %
Neutro Abs: 4.5 10*3/uL (ref 1.7–7.7)
Neutrophils Relative %: 54 %
Platelets: 240 10*3/uL (ref 150–400)
RBC: 5.06 MIL/uL (ref 3.87–5.11)
RDW: 13.9 % (ref 11.5–15.5)
WBC: 8.3 10*3/uL (ref 4.0–10.5)
nRBC: 0 % (ref 0.0–0.2)

## 2019-03-17 LAB — BASIC METABOLIC PANEL
Anion gap: 10 (ref 5–15)
BUN: 18 mg/dL (ref 8–23)
CO2: 28 mmol/L (ref 22–32)
Calcium: 9 mg/dL (ref 8.9–10.3)
Chloride: 102 mmol/L (ref 98–111)
Creatinine, Ser: 0.96 mg/dL (ref 0.44–1.00)
GFR calc Af Amer: >60 mL/min (ref >60)
GFR calc non Af Amer: 55 mL/min — ABNORMAL LOW (ref >60)
Glucose, Bld: 116 mg/dL — ABNORMAL HIGH (ref 70–99)
Potassium: 4.2 mmol/L (ref 3.5–5.1)
Sodium: 140 mmol/L (ref 135–145)

## 2019-03-17 MED ORDER — PROMETHAZINE HCL 25 MG/ML IJ SOLN
6.2500 mg | Freq: Once | INTRAMUSCULAR | Status: AC
  Administered 2019-03-17: 16:00:00 6.25 mg via INTRAVENOUS
  Filled 2019-03-17: qty 1

## 2019-03-17 MED ORDER — MECLIZINE HCL 12.5 MG PO TABS
25.0000 mg | ORAL_TABLET | Freq: Once | ORAL | Status: AC
  Administered 2019-03-17: 25 mg via ORAL
  Filled 2019-03-17: qty 2

## 2019-03-17 MED ORDER — ONDANSETRON HCL 4 MG PO TABS: 4.0000 mg | ORAL_TABLET | Freq: Four times a day (QID) | ORAL | 0 refills | Status: DC

## 2019-03-17 NOTE — ED Triage Notes (Signed)
Pt from home. Pt started having weakness started yesterday. Woke up at 0530 this morning with n/v and dizzy. History for vertigo

## 2019-03-17 NOTE — ED Notes (Signed)
Pt was very dizzy when completing orthostatics.

## 2019-03-17 NOTE — ED Provider Notes (Signed)
[facility] Provider Note   CSN: [id number] Arrival date & time: 03/17/19  1528     History Chief Complaint  Patient presents with  . Weakness    Lauren House is a 84 y.o. female.  HPI   84 year old female with dizziness.  Onset this morning when she got up to sleep.  Associated with nausea.  She denies vomiting to me.  She reports a past history of vertigo.  Symptoms feel similar although she says she is typically not this nauseated.  She describes a room spinning sensation.  Her head feels "full" but not a headache per se.  She says that she has chronic ear ringing.  No acute change.  She feels off balance when she gets up from a stationary position.  She has not fallen.  She is prescribed meclizine but did not try any today.  Past Medical History:  Diagnosis Date  . Anxiety   . Apical variant hypertrophic cardiomyopathy ([place])   . Arthritis   . Borderline diabetes mellitus   . Bruises easily   . Bursitis of left hip   . Coronary atherosclerosis of native coronary artery    a. 2011: Cath showing nonobstructive CAD - 50-60% LAD (normal FFR) b. 06/2015: Cath showing 80% stenosis in the Mid LAD, tx w/ Promus DES 2.25 mm x 16 mm   . Essential hypertension, benign   . History of kidney stones   . History of skin cancer    Basal cell - abdomen  . Hyperlipidemia    Diet controlled.cannot take meds  . Nocturia   . Osteoporosis   . Overactive bladder   . Shortness of breath dyspnea   . Vertigo     Patient Active Problem List   Diagnosis Date Noted  . Coronary artery disease due to lipid rich plaque 01/01/2017  . CAD S/P percutaneous coronary angioplasty 07/22/2015  . DOE (dyspnea on exertion)   . Unstable angina ([place]) 07/12/2015  . Heart palpitations 02/14/2015  . Hip bursitis 03/13/2014  . Trochanteric bursitis of left hip 03/12/2014  . Exertional chest pain 02/10/2014  . Preoperative cardiovascular examination 02/10/2014  . Angina pectoris,  variant ([place]) 12/24/2012  . Left hip pain 12/16/2012  . Bursitis of left hip 12/16/2012  . Osteoarthritis, hip, bilateral 12/16/2012  . Chest pain 08/15/2012  . Apical variant hypertrophic cardiomyopathy ([place]) 08/15/2012  . Bilateral inguinal hernia, right symptomatic 09/21/2011  . COMEDO 10/18/2008  . POSTMENOPAUSAL OSTEOPOROSIS 09/01/2008  . HIP PAIN, LEFT 01/19/2008  . CARCINOMA, BASAL CELL, SKIN 10/02/2006  . VARICOSE VEIN 09/24/2006  . ACTINIC KERATOSIS 09/24/2006  . NECK PAIN 09/24/2006  . BACK PAIN 09/24/2006  . NEOP, BNG, SCALP/SKIN, NECK 04/16/2006  . FIBROIDS, UTERUS 04/16/2006  . Mixed hyperlipidemia 04/16/2006  . MACULAR DEGENERATION 04/16/2006  . Essential hypertension 04/16/2006  . ALLERGIC RHINITIS 04/16/2006  . OVERACTIVE BLADDER 04/16/2006  . URINARY INCONTINENCE 04/16/2006  . SKIN CANCER, HX OF 04/16/2006  . VERTIGO, HX OF 04/16/2006  . RENAL CALCULUS, HX OF 04/16/2006    Past Surgical History:  Procedure Laterality Date  . ABDOMINAL HYSTERECTOMY    . CARDIAC CATHETERIZATION N/A 07/22/2015   Procedure: Left Heart Cath and Coronary Angiography;  Surgeon: Leonie Man, MD;  Location: [facility];  Service: Cardiovascular;  Laterality: N/A;  . CARDIAC CATHETERIZATION N/A 07/22/2015   Procedure: Coronary Stent Intervention;  Surgeon: Leonie Man, MD;  Location: [facility];  Service: Cardiovascular;  Laterality: N/A;  .  CORONARY STENT PLACEMENT  07/22/2015   mid lad  des   . EYE SURGERY     cataracts bilateral  . HEMORRHOID SURGERY    . INGUINAL HERNIA REPAIR  10/11/2011   Procedure: HERNIA REPAIR INGUINAL ADULT;  Surgeon: Stark Klein, MD;  Location: [facility];  Service: General;  Laterality: Right;  . OPEN SURGICAL REPAIR OF GLUTEAL TENDON Left 03/12/2014   Procedure: LEFT HIP BURSECTOMY AND GLUTEAL TENDON REPAIR;  Surgeon: [name], MD;  [facility];  Service: Orthopedics;  Laterality: Left;     OB History    Gravida  4   Para   4   Term  4   Preterm      AB      Living  4     SAB      TAB      Ectopic      Multiple      Live Births              Family History  Problem Relation Age of Onset  . Heart disease Father   . Lung cancer Sister   . Lung cancer Brother     Social History   Tobacco Use  . Smoking status: Never Smoker  . Smokeless tobacco: Never Used  Substance Use Topics  . Alcohol use: No  . Drug use: No    Home Medications Prior to Admission medications   Medication Sig Start Date End Date Taking? Authorizing Provider  aspirin EC 81 MG tablet Take 81 mg by mouth daily.    [provider]  Cyanocobalamin (VITAMIN B12 PO) Take 1 tablet by mouth daily.    [provider]  diclofenac sodium (VOLTAREN) 1 % GEL Apply 2 g topically 4 (four) times daily. 04/28/18   Susy Frizzle, MD  fluticasone (FLONASE) 50 MCG/ACT nasal spray USE TWO SPRAY IN [facility] NOSTRIL EVERY DAY 11/01/15   Susy Frizzle, MD  hydrocortisone cream 1 % Apply 1 application topically 2 (two) times daily. Patient taking differently: Apply 1 application at bedtime topically.  08/31/16   Alycia Rossetti, MD  metoprolol tartrate (LOPRESSOR) 25 MG tablet TAKE 1/2 (ONE-HALF) TABLET BY MOUTH TWICE DAILY 03/19/18   Hilty, Nadean Corwin, MD  Multiple Vitamins-Minerals (OCUVITE PO) Take 1 tablet by mouth daily.    [provider]  nitroGLYCERIN (NITROSTAT) 0.4 MG SL tablet Place 1 tablet (0.4 mg total) under the tongue every 5 (five) minutes as needed for chest pain. 08/26/14   Hilty, Nadean Corwin, MD  Polyethyl Glycol-Propyl Glycol (SYSTANE OP) Apply 1 drop to eye 3 (three) times daily as needed (Dry eyes).    [provider]  ranolazine (RANEXA) 500 MG 12 hr tablet Take 500 mg by mouth daily.    [provider]  senna-docusate (SENOKOT-S) 8.6-50 MG tablet Take 1 tablet by mouth at bedtime as needed for mild constipation or moderate constipation. 01/16/18   Long, Wonda Olds, MD    triamcinolone ointment (KENALOG) 0.1 % Apply 1 application topically 2 (two) times daily. 04/22/18   Delsa Grana, PA-C    Allergies    Chlorhexidine, Vancomycin, Aspirin, Atorvastatin, Bactrim [sulfamethoxazole-trimethoprim], Cephalosporins, Ciprofloxacin, Clarithromycin, Colestipol hcl, Ezetimibe, Prednisone, Simvastatin, Penicillins, and Tape  Review of Systems   Review of Systems All systems reviewed and negative, other than as noted in HPI. Physical Exam Updated Vital Signs BP (!) 161/93   Pulse 81   Temp 98.3 F (36.8 C) (Oral)   Resp 16  Ht 5\' 7"  (1.702 m)   Wt 65.8 kg   SpO2 95%   BMI 22.71 kg/m   Physical Exam Vitals and nursing note reviewed.  Constitutional:      General: She is not in acute distress.    Appearance: She is well-developed.  HENT:     Head: Normocephalic and atraumatic.  Eyes:     General:        Right eye: No discharge.        Left eye: No discharge.     Conjunctiva/sclera: Conjunctivae normal.  Cardiovascular:     Rate and Rhythm: Normal rate and regular rhythm.     Heart sounds: Normal heart sounds. No murmur. No friction rub. No gallop.   Pulmonary:     Effort: Pulmonary effort is normal. No respiratory distress.     Breath sounds: Normal breath sounds.  Abdominal:     General: There is no distension.     Palpations: Abdomen is soft.     Tenderness: There is no abdominal tenderness.  Musculoskeletal:        General: No tenderness.     Cervical back: Neck supple.  Skin:    General: Skin is warm and dry.  Neurological:     Mental Status: She is alert.     Comments: Speech clear.  Content appropriate.  Following commands.  Cranial nerves II through XII are intact.  Horizontal nystagmus.  Strength 5 out of 5 bilateral upper and lower extremities.  Can finger-nose testing bilaterally.  Psychiatric:        Behavior: Behavior normal.        Thought Content: Thought content normal.     ED Results / Procedures / Treatments   Labs (all  labs ordered are listed, but only abnormal results are displayed) Labs Reviewed  BASIC METABOLIC PANEL - Abnormal; Notable for the following components:      Result Value   Glucose, Bld 116 (*)    GFR calc non Af Amer 55 (*)    All other components within normal limits  CBC WITH DIFFERENTIAL/PLATELET    EKG None  Radiology CT Head Wo Contrast  Result Date: 03/17/2019 CLINICAL DATA:  Dizziness, nonspecific. Additional history provided by scanning technologist: Patient began experiencing weakness yesterday, woke up at 5:30 this morning with nausea/vomiting and dizziness, history of vertigo. EXAM: CT HEAD WITHOUT CONTRAST TECHNIQUE: Contiguous axial images were obtained from the base of the skull through the vertex without intravenous contrast. COMPARISON:  MRI/MRA head 10/24/2009 FINDINGS: Brain: No evidence of acute intracranial hemorrhage. No demarcated cortical infarction. No evidence of intracranial mass. No midline shift or extra-axial fluid collection. Moderate ill-defined hypoattenuation within the cerebral white matter is nonspecific, but consistent with chronic small vessel ischemic disease. Mild generalized parenchymal atrophy. Vascular: No hyperdense vessel. Atherosclerotic calcifications. Skull: Normal. Negative for fracture or focal lesion. Sinuses/Orbits: Visualized orbits demonstrate no acute abnormality. Mild ethmoid and right sphenoid sinus mucosal thickening at the imaged levels. No significant mastoid effusion. IMPRESSION: No CT evidence of acute intracranial abnormality. Mild generalized parenchymal atrophy with moderate chronic small vessel ischemic disease. Electronically Signed   By: Kellie Simmering DO   On: 03/17/2019 17:17    Procedures Procedures (including critical care time)  Medications Ordered in ED Medications  meclizine (ANTIVERT) tablet 25 mg (has no administration in time range)  promethazine (PHENERGAN) injection 6.25 mg (has no administration in time range)     ED Course  I have reviewed the triage vital signs and  the nursing notes.  Pertinent labs & imaging results that were available during my care of the patient were reviewed by me and considered in my medical decision making (see chart for details).    MDM Rules/Calculators/A&P 84 year old female with dizziness.  Likely peripheral vertigo.  Neuro exam is nonfocal.  Consider central etiology but doubt.  Plan symptomatic treatment.  Will check basic labs, EKG and CT head.    ED work-up fairly unremarkable.  Suspect peripheral etiology.  Continue as needed meclizine.  Return precautions discussed.  Outpatient follow-up otherwise.  Final Clinical Impression(s) / ED Diagnoses Final diagnoses:  Vertigo    Rx / DC Orders ED Discharge Orders    None       Virgel Manifold, MD 03/18/19 1557

## 2019-03-18 ENCOUNTER — Telehealth: Payer: Self-pay

## 2019-03-18 NOTE — Telephone Encounter (Signed)
Pt called to report that she was recently seen in ED for vertigo and would like to know if she can get an Rx for meclizine. Pt states she was given zofran but nothing for the vertigo. Please advise.

## 2019-03-19 ENCOUNTER — Other Ambulatory Visit: Payer: Self-pay | Admitting: Family Medicine

## 2019-03-19 MED ORDER — MECLIZINE HCL 25 MG PO TABS: 25.0000 mg | ORAL_TABLET | Freq: Three times a day (TID) | ORAL | 0 refills | Status: DC | PRN

## 2019-03-19 NOTE — Telephone Encounter (Signed)
I sent meclizine but she should not take it with zofran

## 2019-04-09 DIAGNOSIS — H43812 Vitreous degeneration, left eye: Secondary | ICD-10-CM | POA: Diagnosis not present

## 2019-04-09 DIAGNOSIS — H353132 Nonexudative age-related macular degeneration, bilateral, intermediate dry stage: Secondary | ICD-10-CM | POA: Diagnosis not present

## 2019-04-09 DIAGNOSIS — Z961 Presence of intraocular lens: Secondary | ICD-10-CM | POA: Diagnosis not present

## 2019-04-09 DIAGNOSIS — H18513 Endothelial corneal dystrophy, bilateral: Secondary | ICD-10-CM | POA: Diagnosis not present

## 2019-04-20 DIAGNOSIS — Z85828 Personal history of other malignant neoplasm of skin: Secondary | ICD-10-CM | POA: Diagnosis not present

## 2019-04-20 DIAGNOSIS — Z08 Encounter for follow-up examination after completed treatment for malignant neoplasm: Secondary | ICD-10-CM | POA: Diagnosis not present

## 2019-05-25 ENCOUNTER — Telehealth: Payer: Self-pay | Admitting: Family Medicine

## 2019-05-25 NOTE — Telephone Encounter (Signed)
Patient calling to say that her arm is swollen and red from covid vaccine, would like to know if she needs to come in to see dr pickard, or what she could do  [phone]

## 2019-05-25 NOTE — Telephone Encounter (Signed)
Spoke to pt and she got her 2nd covid shot Friday and her arm swelled and was painful mainly at night. She has been using ice and taking tylenol. Informed pt that she is doing the correct treatment and she could use some Benadryl as well but to continue her routine and if it gets worse to seek medical attention.

## 2019-05-30 IMAGING — CT CT HIP*L* W/O CM
2 of 3 series · 17 of 46 positions shown, 19 images · non-contrast
Comparison: CT abdomen pelvis dated August 24, 2011.

CLINICAL DATA: Persistent left hip pain since fall 2 weeks ago.

EXAM:
CT OF THE LEFT HIP WITHOUT CONTRAST
TECHNIQUE: Multidetector CT imaging of the left hip was performed according to
the standard protocol. Multiplanar CT image reconstructions were
also generated.

[Series 3: axial st · axial · 0.46mm/px · z∈[+859,+1041]mm · 14 of 105 slices shown, 16 images]
[im 7/105  soft-tissue]
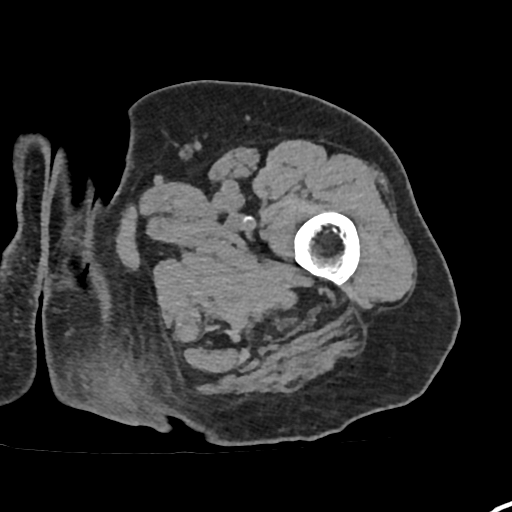
[im 7/105  bone]
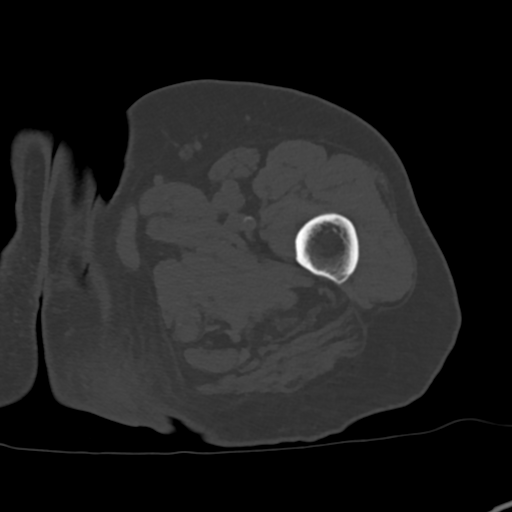
[im 14/105  soft-tissue]
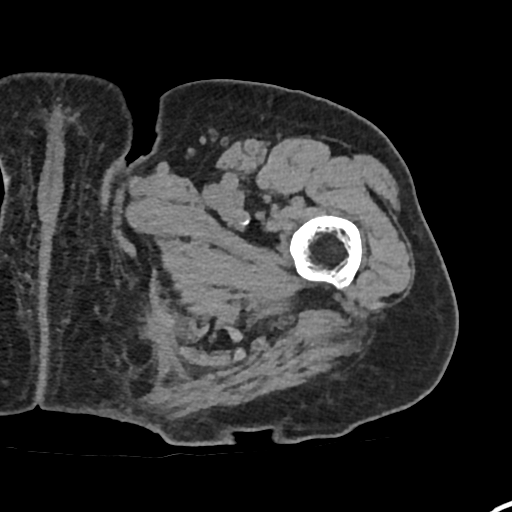
[im 21/105  soft-tissue]
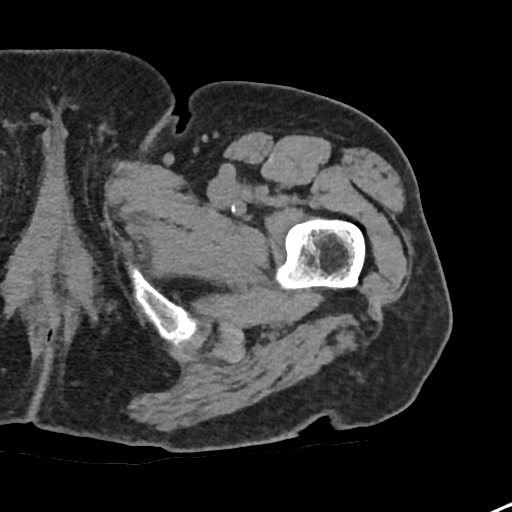
[im 27/105  soft-tissue]
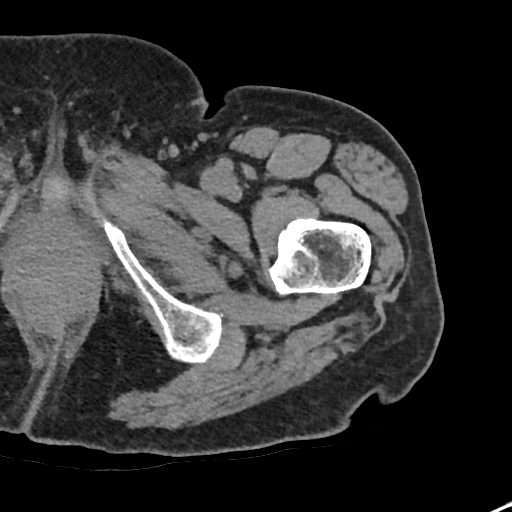
[im 34/105  soft-tissue]
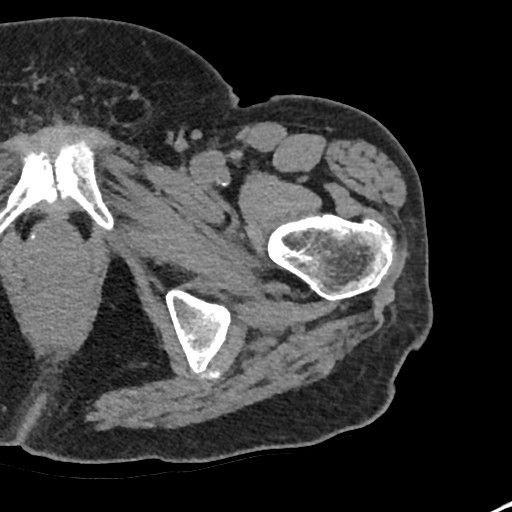
[im 41/105  soft-tissue]
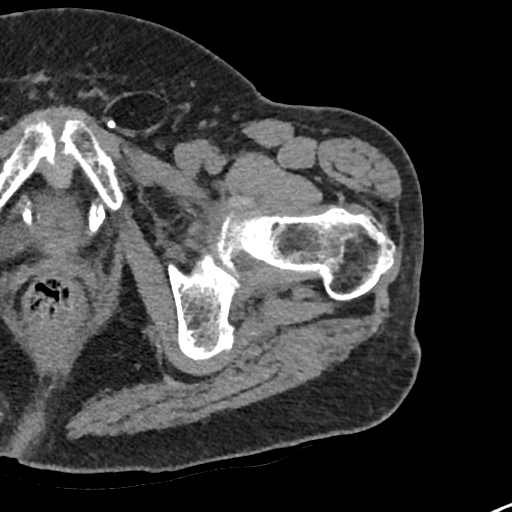
[im 47/105  soft-tissue]
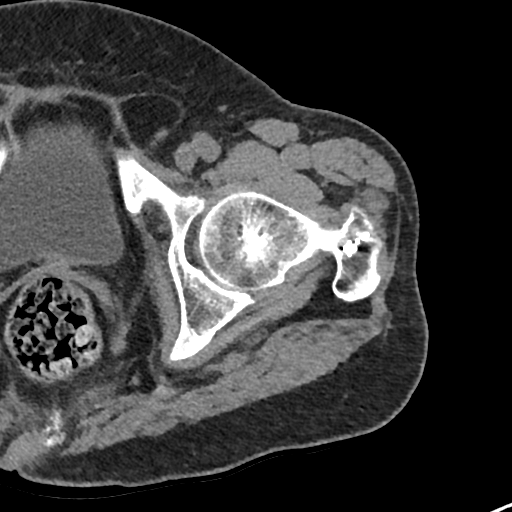
[im 58/105  soft-tissue]
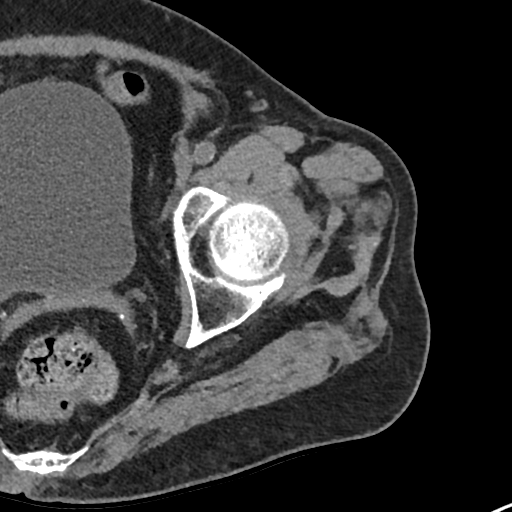
[im 64/105  soft-tissue]
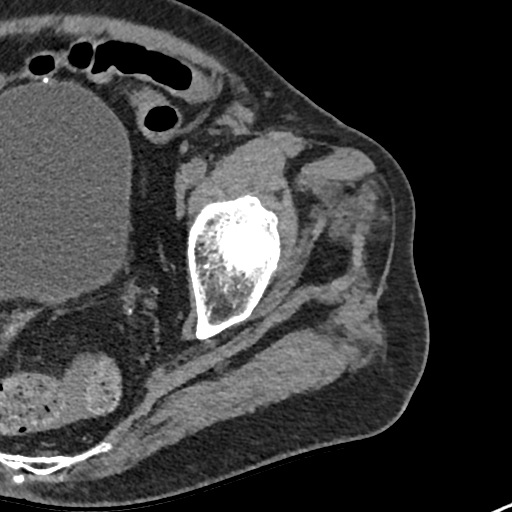
[im 64/105  bone]
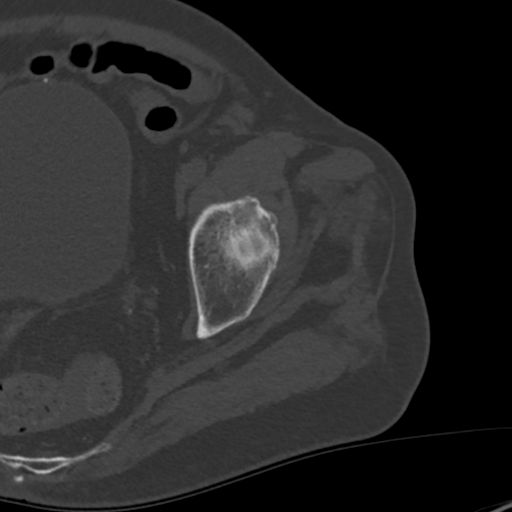
[im 71/105  soft-tissue]
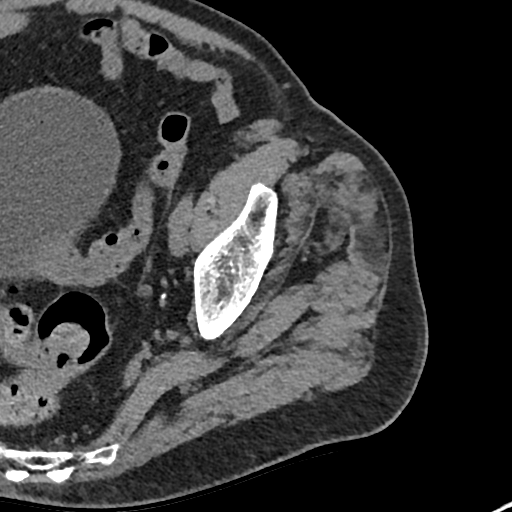
[im 78/105  soft-tissue]
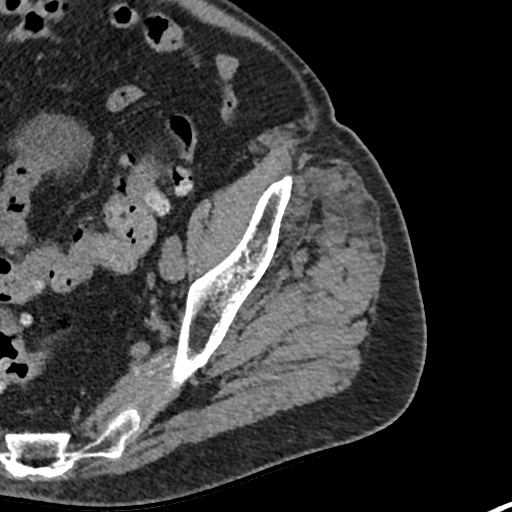
[im 84/105  soft-tissue]
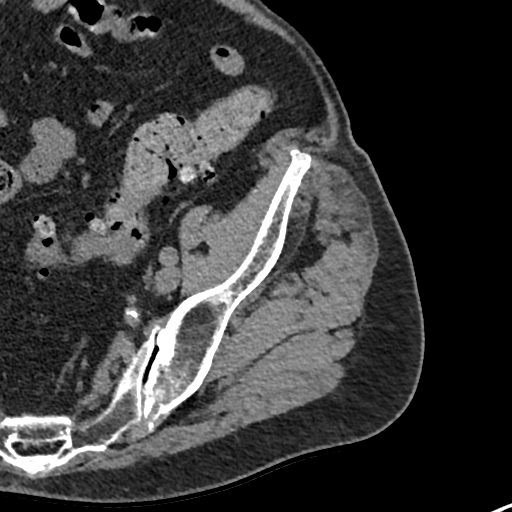
[im 91/105  soft-tissue]
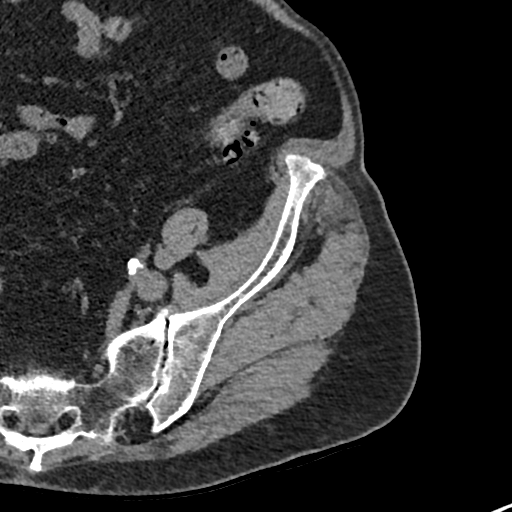
[im 98/105  soft-tissue]
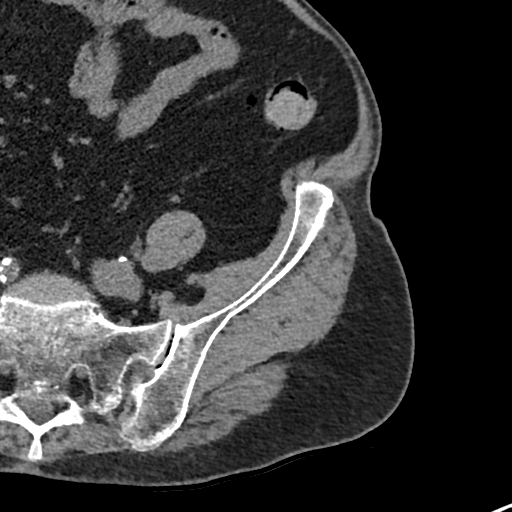

[Series 8: coronal st · coronal · 0.43mm/px · 3 of 76 slices shown]
[im 26/76  soft-tissue]
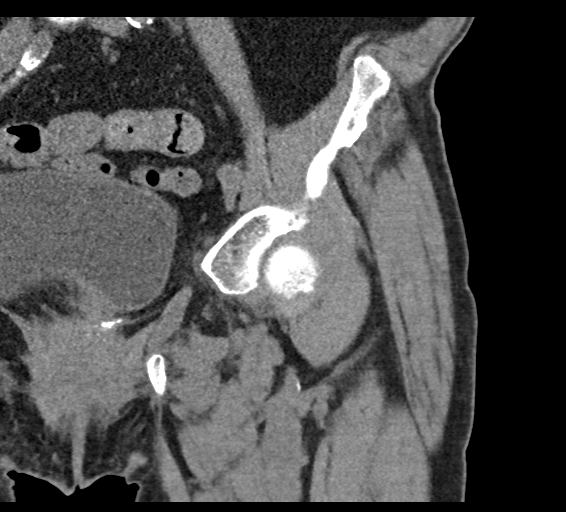
[im 34/76  soft-tissue]
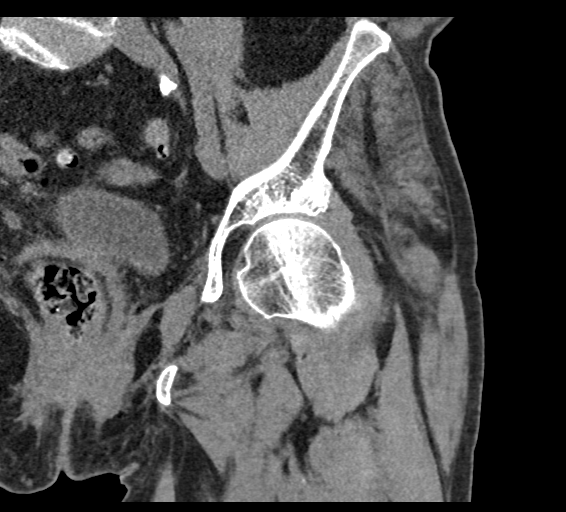
[im 42/76  soft-tissue]
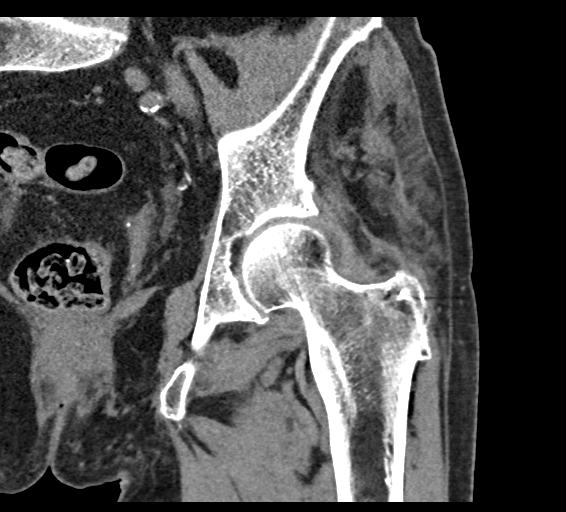

[17 of 46 positions shown; findings below may reference images not displayed]

FINDINGS: Bones/Joint/Cartilage

No fracture or dislocation. Normal alignment. No joint effusion.
Small left hip marginal osteophytes with relative preservation of
the joint space. Degenerative changes of the pubic symphysis and
left sacroiliac joint.

Ligaments

Ligaments are suboptimally evaluated by CT.

Muscles and Tendons
Suture anchors within the left greater tuberosity related to prior
gluteal tendon repair. The gluteal, hamstring, and iliopsoas tendons
are grossly intact. Mild gluteus medius and minimus muscle atrophy.

Soft tissue
No fluid collection or hematoma. No soft tissue mass. Small fat
containing left inguinal hernia. Sigmoid diverticulosis. Prior
hysterectomy. Atherosclerotic vascular calcifications.
IMPRESSION: 1.  No acute osseous abnormality.

## 2019-06-02 DIAGNOSIS — G5601 Carpal tunnel syndrome, right upper limb: Secondary | ICD-10-CM | POA: Diagnosis not present

## 2019-06-02 DIAGNOSIS — M65341 Trigger finger, right ring finger: Secondary | ICD-10-CM | POA: Diagnosis not present

## 2019-06-16 DIAGNOSIS — M79641 Pain in right hand: Secondary | ICD-10-CM | POA: Diagnosis not present

## 2019-06-23 ENCOUNTER — Other Ambulatory Visit: Payer: Self-pay | Admitting: Internal Medicine

## 2019-07-01 DIAGNOSIS — G5601 Carpal tunnel syndrome, right upper limb: Secondary | ICD-10-CM | POA: Diagnosis not present

## 2019-07-01 DIAGNOSIS — Z4789 Encounter for other orthopedic aftercare: Secondary | ICD-10-CM | POA: Diagnosis not present

## 2019-07-01 DIAGNOSIS — M65332 Trigger finger, left middle finger: Secondary | ICD-10-CM | POA: Diagnosis not present

## 2019-07-01 DIAGNOSIS — M65341 Trigger finger, right ring finger: Secondary | ICD-10-CM | POA: Diagnosis not present

## 2019-07-30 ENCOUNTER — Other Ambulatory Visit: Payer: Self-pay

## 2019-07-30 ENCOUNTER — Ambulatory Visit: Payer: Medicare Other | Admitting: Nurse Practitioner

## 2019-07-30 VITALS — BP 138/82 | HR 69 | Temp 97.6°F | Resp 18 | Wt 152.6 lb

## 2019-07-30 DIAGNOSIS — D492 Neoplasm of unspecified behavior of bone, soft tissue, and skin: Secondary | ICD-10-CM

## 2019-07-30 DIAGNOSIS — D485 Neoplasm of uncertain behavior of skin: Secondary | ICD-10-CM | POA: Diagnosis not present

## 2019-07-30 DIAGNOSIS — N9089 Other specified noninflammatory disorders of vulva and perineum: Secondary | ICD-10-CM | POA: Diagnosis not present

## 2019-07-30 DIAGNOSIS — S70261A Insect bite (nonvenomous), right hip, initial encounter: Secondary | ICD-10-CM

## 2019-07-30 DIAGNOSIS — W57XXXA Bitten or stung by nonvenomous insect and other nonvenomous arthropods, initial encounter: Secondary | ICD-10-CM

## 2019-07-30 NOTE — Progress Notes (Signed)
Established Patient Office Visit  Subjective:  Patient ID: Lauren House, female    DOB: 29-Jan-1936  Age: 84 y.o. MRN: [id number]  CC:  Chief Complaint  Patient presents with  . Breast Mass    inside R thigh, been there for a while  . Insect Bite    tick bite on 06/01 R hip, itching and redness,     HPI Lauren House is a 84 year old female presenting to the clinic with concerns that she has a skin growth that has abdominal to her right labia. The growth was first noticed 2 years ago and she has not had any treatment. The patient does report seeing her dermatologist approximately a month ago with removal of a cancerous skin lesion to her neck.  The patient also removed a tick to her right hip 2 days ago. The area now he is slightly red circular and elevated. The area does not itch drying. She has tried no treatment.  Past Medical History:  Diagnosis Date  . Anxiety   . Apical variant hypertrophic cardiomyopathy ([place])   . Arthritis   . Borderline diabetes mellitus   . Bruises easily   . Bursitis of left hip   . Coronary atherosclerosis of native coronary artery    a. 2011: Cath showing nonobstructive CAD - 50-60% LAD (normal FFR) b. 06/2015: Cath showing 80% stenosis in the Mid LAD, tx w/ Promus DES 2.25 mm x 16 mm   . Essential hypertension, benign   . History of kidney stones   . History of skin cancer    Basal cell - abdomen  . Hyperlipidemia    Diet controlled.cannot take meds  . Nocturia   . Osteoporosis   . Overactive bladder   . Shortness of breath dyspnea   . Vertigo     Past Surgical History:  Procedure Laterality Date  . ABDOMINAL HYSTERECTOMY    . CARDIAC CATHETERIZATION N/A 07/22/2015   Procedure: Left Heart Cath and Coronary Angiography;  Surgeon: Leonie Man, MD;  Location: [place] [facility];  Service: Cardiovascular;  Laterality: N/A;  . CARDIAC CATHETERIZATION N/A 07/22/2015   Procedure: Coronary Stent Intervention;  Surgeon: Leonie Man, MD;   Location: [place] [facility];  Service: Cardiovascular;  Laterality: N/A;  . CORONARY STENT PLACEMENT  07/22/2015   mid lad  des   . EYE SURGERY     cataracts bilateral  . HEMORRHOID SURGERY    . INGUINAL HERNIA REPAIR  10/11/2011   Procedure: HERNIA REPAIR INGUINAL ADULT;  Surgeon: Stark Klein, MD;  Location: [place];  Service: General;  Laterality: Right;  . OPEN SURGICAL REPAIR OF GLUTEAL TENDON Left 03/12/2014   Procedure: LEFT HIP BURSECTOMY AND GLUTEAL TENDON REPAIR;  Surgeon: [name], MD;  Location: [facility];  Service: Orthopedics;  Laterality: Left;    Family History  Problem Relation Age of Onset  . Heart disease Father   . Lung cancer Sister   . Lung cancer Brother     Social History   Socioeconomic History  . Marital status: Widowed    Spouse name: Not on file  . Number of children: Not on file  . Years of education: Not on file  . Highest education level: Not on file  Occupational History  . Not on file  Tobacco Use  . Smoking status: Never Smoker  . Smokeless tobacco: Never Used  Substance and Sexual Activity  . Alcohol use: No  . Drug use: No  .  Sexual activity: Never  Other Topics Concern  . Not on file  Social History Narrative  . Not on file   Social Determinants of Health   Financial Resource Strain:   . Difficulty of Paying Living Expenses:   Food Insecurity:   . Worried About Charity fundraiser in the Last Year:   . Arboriculturist in the Last Year:   Transportation Needs:   . Film/video editor (Medical):   Marland Kitchen Lack of Transportation (Non-Medical):   Physical Activity:   . Days of Exercise per Week:   . Minutes of Exercise per Session:   Stress:   . Feeling of Stress :   Social Connections:   . Frequency of Communication with Friends and Family:   . Frequency of Social Gatherings with Friends and Family:   . Attends Religious Services:   . Active Member of Clubs or Organizations:   . Attends Archivist Meetings:   Marland Kitchen  Marital Status:   Intimate Partner Violence:   . Fear of Current or Ex-Partner:   . Emotionally Abused:   Marland Kitchen Physically Abused:   . Sexually Abused:     Outpatient Medications Prior to Visit  Medication Sig Dispense Refill  . aspirin EC 81 MG tablet Take 81 mg by mouth daily.    . Cyanocobalamin (VITAMIN B12 PO) Take 1 tablet by mouth daily.    . diclofenac sodium (VOLTAREN) 1 % GEL Apply 2 g topically 4 (four) times daily. 100 g 1  . fluticasone (FLONASE) 50 MCG/ACT nasal spray USE TWO SPRAY IN EACH NOSTRIL EVERY DAY 50 g 1  . hydrocortisone cream 1 % Apply 1 application topically 2 (two) times daily. (Patient taking differently: Apply 1 application at bedtime topically. ) 30 g 0  . meclizine (ANTIVERT) 25 MG tablet Take 1 tablet (25 mg total) by mouth 3 (three) times daily as needed for dizziness. Do not take with zofran 30 tablet 0  . metoprolol tartrate (LOPRESSOR) 25 MG tablet Take 1 tablet (25 mg total) by mouth 2 (two) times daily. 30 tablet 9  . Multiple Vitamins-Minerals (OCUVITE PO) Take 1 tablet by mouth daily.    . nitroGLYCERIN (NITROSTAT) 0.4 MG SL tablet Place 1 tablet (0.4 mg total) under the tongue every 5 (five) minutes as needed for chest pain. 25 tablet 3  . ondansetron (ZOFRAN) 4 MG tablet Take 1 tablet (4 mg total) by mouth every 6 (six) hours. 12 tablet 0  . Polyethyl Glycol-Propyl Glycol (SYSTANE OP) Apply 1 drop to eye 3 (three) times daily as needed (Dry eyes).    . ranolazine (RANEXA) 500 MG 12 hr tablet Take 1 tablet (500 mg total) by mouth 2 (two) times daily. 60 tablet 3  . senna-docusate (SENOKOT-S) 8.6-50 MG tablet Take 1 tablet by mouth at bedtime as needed for mild constipation or moderate constipation. 20 tablet 0  . triamcinolone ointment (KENALOG) 0.1 % Apply 1 application topically 2 (two) times daily. 30 g 0   No facility-administered medications prior to visit.    Allergies  Allergen Reactions  . Chlorhexidine Itching  . Vancomycin Hives  .  Aspirin Other (See Comments)    Nose bleeds with full strength  . Atorvastatin Other (See Comments)    Muscle pain  . Bactrim [Sulfamethoxazole-Trimethoprim] Other (See Comments)    Doesn't remember   . Cephalosporins Hives and Itching  . Ciprofloxacin Nausea Only  . Clarithromycin Other (See Comments)    Doesn't remember   .  Colestipol Hcl Other (See Comments)    Hallucinations  . Ezetimibe Other (See Comments)    GI upset  . Prednisone   . Simvastatin Other (See Comments)    Muscle spasms  . Penicillins Rash    Has patient had a PCN reaction causing immediate rash, facial/tongue/throat swelling, SOB or lightheadedness with hypotension: yes Has patient had a PCN reaction causing severe rash involving mucus membranes or skin necrosis: no Has patient had a PCN reaction that required hospitalization no Has patient had a PCN reaction occurring within the last 10 years: no If all of the above answers are "NO", then may proceed with Cephalosporin use.   . Tape Rash    ROS Review of Systems  All other systems reviewed and are negative.     Objective:    Physical Exam  Constitutional: She is oriented to person, place, and time. She appears well-developed and well-nourished.  HENT:  Head: Normocephalic.  Eyes: Pupils are equal, round, and reactive to light. Conjunctivae and EOM are normal.  Neck: No JVD present.  Cardiovascular: Normal rate.  Pulmonary/Chest: Effort normal.  Abdominal: Soft.  Genitourinary:    There is lesion on the right labia.    Genitourinary Comments: <1x1cm blak hard elevated lesion. Top removed to surface level of normal skin, the lesion continues into skin, pathology sample obtained.    Musculoskeletal:     Cervical back: Normal range of motion.  Neurological: She is alert and oriented to person, place, and time.  Skin: Skin is warm and dry.     Right hip less than 0.5 circular minimally elevated mild erythremic bump region of tick removal. No  drainage, no foul odor, no bulls eye  Psychiatric: She has a normal mood and affect. Her behavior is normal. Thought content normal.  Nursing note and vitals reviewed.   BP 138/82 (BP Location: Left Arm, Patient Position: Sitting, Cuff Size: Normal)   Pulse 69   Temp 97.6 F (36.4 C) (Temporal)   Resp 18   Wt 152 lb 9.6 oz (69.2 kg)   SpO2 97%   BMI 23.90 kg/m  Wt Readings from Last 3 Encounters:  07/30/19 152 lb 9.6 oz (69.2 kg)  03/17/19 145 lb (65.8 kg)  12/18/18 146 lb (66.2 kg)     Health Maintenance Due  Topic Date Due  . URINE MICROALBUMIN  Never done  . COVID-19 Vaccine (1) Never done  . TETANUS/TDAP  06/26/2013    There are no preventive care reminders to display for this patient.  Lab Results  Component Value Date   TSH 2.22 07/12/2015   Lab Results  Component Value Date   WBC 8.3 03/17/2019   HGB 14.3 03/17/2019   HCT 45.1 03/17/2019   MCV 89.1 03/17/2019   PLT 240 03/17/2019   Lab Results  Component Value Date   NA 140 03/17/2019   K 4.2 03/17/2019   CO2 28 03/17/2019   GLUCOSE 116 (H) 03/17/2019   BUN 18 03/17/2019   CREATININE 0.96 03/17/2019   BILITOT 0.4 10/24/2018   ALKPHOS 64 12/23/2012   AST 15 10/24/2018   ALT 11 10/24/2018   PROT 7.2 10/24/2018   ALBUMIN 3.6 12/23/2012   CALCIUM 9.0 03/17/2019   ANIONGAP 10 03/17/2019   Lab Results  Component Value Date   CHOL 300 (H) 11/05/2016   Lab Results  Component Value Date   HDL 46 (L) 11/05/2016   Lab Results  Component Value Date   LDLCALC 210 (H) 11/05/2016  Lab Results  Component Value Date   TRIG 249 (H) 11/05/2016   Lab Results  Component Value Date   CHOLHDL 6.5 (H) 11/05/2016   Lab Results  Component Value Date   HGBA1C (H) 02/10/2010    6.3 (NOTE)                                                                       According to the ADA Clinical Practice Recommendations for 2011, when HbA1c is used as a screening test:   >=6.5%   Diagnostic of Diabetes Mellitus            (if abnormal result  is confirmed)  5.7-6.4%   Increased risk of developing Diabetes Mellitus  References:Diagnosis and Classification of Diabetes Mellitus,Diabetes [id number] 1):S62-S69 and Standards of Medical Care in         Diabetes - 2011,Diabetes [id number]  (Suppl 1):S11-S61.      Assessment & Plan:   Problem List Items Addressed This Visit    None    Visit Diagnoses    Abnormal skin growth    -  Primary   Relevant Orders   Pathology Report ([facility])   Skin tag of labia       Relevant Orders   Pathology Report ([facility])   Tick bite of hip, right, initial encounter        Abnormal skin growth was partially removed to right labia and will be sent to pathology.  For the tick bite/tick removal to the right hip, apply Neosporin twice a day keeping the area clean and dry, seek medical attention for bulls eye type rash over the next month, drainage foul odor increased redness fever or chills.  Excision of skin lesion care after, tick bite information abdominal easy-to-read Printed information provided.   Follow-up: Return if symptoms worsen or fail to improve.    Annie Main, FNP

## 2019-07-30 NOTE — Patient Instructions (Addendum)
Abnormal skin growth was partially removed to right labia and will be sent to pathology.  For the tick bite/tick removal to the right hip, apply Neosporin twice a day keeping the area clean and dry, seek medical attention for bulls eye type rash over the next month, drainage foul odor increased redness fever or chills.

## 2019-08-03 LAB — TISSUE SPECIMEN

## 2019-08-03 LAB — PATHOLOGY REPORT

## 2019-08-03 NOTE — Progress Notes (Signed)
Lauren House let the pt know that pathology has came back not malignant. Let her know that we can proceed with removing the remaining if she would like to make an appt.

## 2019-08-10 ENCOUNTER — Ambulatory Visit: Payer: Medicare Other | Admitting: Nurse Practitioner

## 2019-08-10 ENCOUNTER — Other Ambulatory Visit: Payer: Self-pay

## 2019-08-10 ENCOUNTER — Encounter: Payer: Self-pay | Admitting: Nurse Practitioner

## 2019-08-10 VITALS — BP 132/66 | HR 65 | Temp 98.2°F | Resp 18 | Wt 150.0 lb

## 2019-08-10 DIAGNOSIS — L708 Other acne: Secondary | ICD-10-CM

## 2019-08-10 DIAGNOSIS — D239 Other benign neoplasm of skin, unspecified: Secondary | ICD-10-CM

## 2019-08-10 MED ORDER — LIDOCAINE HCL (PF) 1 % IJ SOLN: 2.0000 mL | Freq: Once | INTRAMUSCULAR | Status: DC

## 2019-08-10 NOTE — Progress Notes (Signed)
Established Patient Office Visit  Subjective:  Patient ID: Lauren House, female    DOB: [birth year]  Age: 84 y.o. MRN: [id number]  CC:  Chief Complaint  Patient presents with  . Nevus    removal    HPI Lauren House is a 84 year old female presenting to clinic after lesion was sent to pathology and returned benign. She presents for removal of remainig lesion to the right inner thigh just distal right labia. She denied drainage, foul odor, pain, redness, swelling,or fever/chills.  Explained procedure to pt and she desires to proceed.  Past Medical History:  Diagnosis Date  . Anxiety   . Apical variant hypertrophic cardiomyopathy ([place])   . Arthritis   . Borderline diabetes mellitus   . Bruises easily   . Bursitis of left hip   . Coronary atherosclerosis of native coronary artery    a. 2011: Cath showing nonobstructive CAD - 50-60% LAD (normal FFR) b. 06/2015: Cath showing 80% stenosis in the Mid LAD, tx w/ Promus DES 2.25 mm x 16 mm   . Essential hypertension, benign   . History of kidney stones   . History of skin cancer    Basal cell - abdomen  . Hyperlipidemia    Diet controlled.cannot take meds  . Nocturia   . Osteoporosis   . Overactive bladder   . Shortness of breath dyspnea   . Vertigo     Past Surgical History:  Procedure Laterality Date  . ABDOMINAL HYSTERECTOMY    . CARDIAC CATHETERIZATION N/A 07/22/2015   Procedure: Left Heart Cath and Coronary Angiography;  Surgeon: Leonie Man, MD;  Location: [place] [facility];  Service: Cardiovascular;  Laterality: N/A;  . CARDIAC CATHETERIZATION N/A 07/22/2015   Procedure: Coronary Stent Intervention;  Surgeon: Leonie Man, MD;  Location: [place] [facility];  Service: Cardiovascular;  Laterality: N/A;  . CORONARY STENT PLACEMENT  07/22/2015   mid lad  des   . EYE SURGERY     cataracts bilateral  . HEMORRHOID SURGERY    . INGUINAL HERNIA REPAIR  10/11/2011   Procedure: HERNIA REPAIR INGUINAL ADULT;  Surgeon:  Stark Klein, MD;  Location: [place];  Service: General;  Laterality: Right;  . OPEN SURGICAL REPAIR OF GLUTEAL TENDON Left 03/12/2014   Procedure: LEFT HIP BURSECTOMY AND GLUTEAL TENDON REPAIR;  Surgeon: [name], MD;  Location: [facility];  Service: Orthopedics;  Laterality: Left;    Family History  Problem Relation Age of Onset  . Heart disease Father   . Lung cancer Sister   . Lung cancer Brother     Social History   Socioeconomic History  . Marital status: Widowed    Spouse name: Not on file  . Number of children: Not on file  . Years of education: Not on file  . Highest education level: Not on file  Occupational History  . Not on file  Tobacco Use  . Smoking status: Never Smoker  . Smokeless tobacco: Never Used  Substance and Sexual Activity  . Alcohol use: No  . Drug use: No  . Sexual activity: Never  Other Topics Concern  . Not on file  Social History Narrative  . Not on file   Social Determinants of Health   Financial Resource Strain:   . Difficulty of Paying Living Expenses:   Food Insecurity:   . Worried About Charity fundraiser in the Last Year:   . [place] in the  Last Year:   Transportation Needs:   . Film/video editor (Medical):   Marland Kitchen Lack of Transportation (Non-Medical):   Physical Activity:   . Days of Exercise per Week:   . Minutes of Exercise per Session:   Stress:   . Feeling of Stress :   Social Connections:   . Frequency of Communication with Friends and Family:   . Frequency of Social Gatherings with Friends and Family:   . Attends Religious Services:   . Active Member of Clubs or Organizations:   . Attends Archivist Meetings:   Marland Kitchen Marital Status:   Intimate Partner Violence:   . Fear of Current or Ex-Partner:   . Emotionally Abused:   Marland Kitchen Physically Abused:   . Sexually Abused:     Outpatient Medications Prior to Visit  Medication Sig Dispense Refill  . aspirin EC 81 MG tablet Take 81 mg by mouth daily.    .  Cyanocobalamin (VITAMIN B12 PO) Take 1 tablet by mouth daily.    . diclofenac sodium (VOLTAREN) 1 % GEL Apply 2 g topically 4 (four) times daily. 100 g 1  . fluticasone (FLONASE) 50 MCG/ACT nasal spray USE TWO SPRAY IN EACH NOSTRIL EVERY DAY 50 g 1  . hydrocortisone cream 1 % Apply 1 application topically 2 (two) times daily. (Patient taking differently: Apply 1 application at bedtime topically. ) 30 g 0  . meclizine (ANTIVERT) 25 MG tablet Take 1 tablet (25 mg total) by mouth 3 (three) times daily as needed for dizziness. Do not take with zofran 30 tablet 0  . metoprolol tartrate (LOPRESSOR) 25 MG tablet Take 1 tablet (25 mg total) by mouth 2 (two) times daily. 30 tablet 9  . Multiple Vitamins-Minerals (OCUVITE PO) Take 1 tablet by mouth daily.    . nitroGLYCERIN (NITROSTAT) 0.4 MG SL tablet Place 1 tablet (0.4 mg total) under the tongue every 5 (five) minutes as needed for chest pain. 25 tablet 3  . ondansetron (ZOFRAN) 4 MG tablet Take 1 tablet (4 mg total) by mouth every 6 (six) hours. 12 tablet 0  . Polyethyl Glycol-Propyl Glycol (SYSTANE OP) Apply 1 drop to eye 3 (three) times daily as needed (Dry eyes).    . ranolazine (RANEXA) 500 MG 12 hr tablet Take 1 tablet (500 mg total) by mouth 2 (two) times daily. 60 tablet 3  . senna-docusate (SENOKOT-S) 8.6-50 MG tablet Take 1 tablet by mouth at bedtime as needed for mild constipation or moderate constipation. 20 tablet 0  . triamcinolone ointment (KENALOG) 0.1 % Apply 1 application topically 2 (two) times daily. 30 g 0   No facility-administered medications prior to visit.    Allergies  Allergen Reactions  . Chlorhexidine Itching  . Vancomycin Hives  . Aspirin Other (See Comments)    Nose bleeds with full strength  . Atorvastatin Other (See Comments)    Muscle pain  . Bactrim [Sulfamethoxazole-Trimethoprim] Other (See Comments)    Doesn't remember   . Cephalosporins Hives and Itching  . Ciprofloxacin Nausea Only  . Clarithromycin Other  (See Comments)    Doesn't remember   . Colestipol Hcl Other (See Comments)    Hallucinations  . Ezetimibe Other (See Comments)    GI upset  . Prednisone   . Simvastatin Other (See Comments)    Muscle spasms  . Penicillins Rash    Has patient had a PCN reaction causing immediate rash, facial/tongue/throat swelling, SOB or lightheadedness with hypotension: yes Has patient had a PCN reaction  causing severe rash involving mucus membranes or skin necrosis: no Has patient had a PCN reaction that required hospitalization no Has patient had a PCN reaction occurring within the last 10 years: no If all of the above answers are "NO", then may proceed with Cephalosporin use.   . Tape Rash    ROS Review of Systems  All other systems reviewed and are negative.     Objective:    Physical Exam Vitals and nursing note reviewed.  Constitutional:      Appearance: Normal appearance.  HENT:     Head: Normocephalic.  Eyes:     Extraocular Movements: Extraocular movements intact.     Conjunctiva/sclera: Conjunctivae normal.     Pupils: Pupils are equal, round, and reactive to light.  Cardiovascular:     Rate and Rhythm: Normal rate.     Pulses: Normal pulses.  Pulmonary:     Effort: Pulmonary effort is normal.  Abdominal:     General: Abdomen is flat.  Musculoskeletal:     Cervical back: Normal range of motion.  Skin:    General: Skin is warm and dry.          Comments: Examination appearance and  pathology report consistent Pore of Winer   Neurological:     General: No focal deficit present.     Mental Status: She is alert and oriented to person, place, and time.     Sensory: Sensation is intact.  Psychiatric:        Mood and Affect: Mood normal.        Behavior: Behavior normal.        Thought Content: Thought content normal.        Judgment: Judgment normal.     BP 132/66 (BP Location: Left Arm, Patient Position: Sitting, Cuff Size: Normal)   Pulse 65   Temp 98.2 F (36.8  C) (Temporal)   Resp 18   Wt 150 lb (68 kg)   SpO2 98%   BMI 23.49 kg/m  Wt Readings from Last 3 Encounters:  08/10/19 150 lb (68 kg)  07/30/19 152 lb 9.6 oz (69.2 kg)  03/17/19 145 lb (65.8 kg)     Health Maintenance Due  Topic Date Due  . URINE MICROALBUMIN  Never done  . COVID-19 Vaccine (1) Never done  . TETANUS/TDAP  06/26/2013    There are no preventive care reminders to display for this patient.  Lab Results  Component Value Date   TSH 2.22 07/12/2015   Lab Results  Component Value Date   WBC 8.3 03/17/2019   HGB 14.3 03/17/2019   HCT 45.1 03/17/2019   MCV 89.1 03/17/2019   PLT 240 03/17/2019   Lab Results  Component Value Date   NA 140 03/17/2019   K 4.2 03/17/2019   CO2 28 03/17/2019   GLUCOSE 116 (H) 03/17/2019   BUN 18 03/17/2019   CREATININE 0.96 03/17/2019   BILITOT 0.4 10/24/2018   ALKPHOS 64 12/23/2012   AST 15 10/24/2018   ALT 11 10/24/2018   PROT 7.2 10/24/2018   ALBUMIN 3.6 12/23/2012   CALCIUM 9.0 03/17/2019   ANIONGAP 10 03/17/2019   Lab Results  Component Value Date   CHOL 300 (H) 11/05/2016   Lab Results  Component Value Date   HDL 46 (L) 11/05/2016   Lab Results  Component Value Date   LDLCALC 210 (H) 11/05/2016   Lab Results  Component Value Date   TRIG 249 (H) 11/05/2016   Lab Results  Component Value Date   CHOLHDL 6.5 (H) 11/05/2016   Lab Results  Component Value Date   HGBA1C (H) 02/10/2010    6.3 (NOTE)                                                                       According to the ADA Clinical Practice Recommendations for 2011, when HbA1c is used as a screening test:   >=6.5%   Diagnostic of Diabetes Mellitus           (if abnormal result  is confirmed)  5.7-6.4%   Increased risk of developing Diabetes Mellitus  References:Diagnosis and Classification of Diabetes Mellitus,Diabetes [id number],3704,88(QBVQX 1):S62-S69 and Standards of Medical Care in         Diabetes - 2011,Diabetes IHWT,8882,80  (Suppl  1):S11-S61.      Assessment & Plan:   Problem List Items Addressed This Visit    None    Visit Diagnoses    Dilated pore of Winer    -  Primary   Relevant Medications   lidocaine (PF) (XYLOCAINE) 1 % injection 2 mL (Start on 08/10/2019 11:45 AM)     Affected area was cleaned with betadine and alcohol prep, numbed using 1% lidocaine, pt becaome numb to the affected area and the area was debrided and material and pathology report was consistent with pore of Winer today to the right inner thigh just distal from labia. Area was then re- cleaned and neosporin applied. Pt given directions to keep clean, apply neosporin twice a day for 3 days, seek medical attention for purulent drainage, foul odor, fever, chills, redness, swelling to the affected area.    Follow-up: Return if symptoms worsen or fail to improve.    Annie Main, FNP

## 2019-09-02 ENCOUNTER — Other Ambulatory Visit: Payer: Self-pay

## 2019-09-02 ENCOUNTER — Ambulatory Visit: Payer: Medicare Other | Admitting: Nurse Practitioner

## 2019-09-02 ENCOUNTER — Other Ambulatory Visit: Payer: Self-pay | Admitting: Nurse Practitioner

## 2019-09-02 VITALS — BP 138/72 | HR 73 | Temp 97.6°F | Resp 18 | Wt 151.6 lb

## 2019-09-02 DIAGNOSIS — N6320 Unspecified lump in the left breast, unspecified quadrant: Secondary | ICD-10-CM

## 2019-09-02 DIAGNOSIS — R2241 Localized swelling, mass and lump, right lower limb: Secondary | ICD-10-CM

## 2019-09-02 NOTE — Progress Notes (Signed)
Established Patient Office Visit  Subjective:  Patient ID: Lauren House, female    DOB: Sep 08, 1935  Age: 84 y.o. MRN: [id number]  CC:  Chief Complaint  Patient presents with  . Cyst    on R leg, pt states it stings, no itching, noticed it on 07/04, also cyst on L breast is getting bigger    HPI Lauren House is a 84 year old female presenting to the clinic with two symptoms. First she has a bruise with a lump in the center to her right lower extremity mid shin that appears in the past 3-4 days and second she has a lump to her left lower side of breast that she has had removed a few years ago that she says has returned over the past 6 months. She denied known injury or treatments. No other sxs such as nipple discharge, drainage, foul odor, numbness to extremity, or tingling or weakness.   Past Medical History:  Diagnosis Date  . Anxiety   . Apical variant hypertrophic cardiomyopathy ([place])   . Arthritis   . Borderline diabetes mellitus   . Bruises easily   . Bursitis of left hip   . Coronary atherosclerosis of native coronary artery    a. 2011: Cath showing nonobstructive CAD - 50-60% LAD (normal FFR) b. 06/2015: Cath showing 80% stenosis in the Mid LAD, tx w/ Promus DES 2.25 mm x 16 mm   . Essential hypertension, benign   . History of kidney stones   . History of skin cancer    Basal cell - abdomen  . Hyperlipidemia    Diet controlled.cannot take meds  . Nocturia   . Osteoporosis   . Overactive bladder   . Shortness of breath dyspnea   . Vertigo     Past Surgical History:  Procedure Laterality Date  . ABDOMINAL HYSTERECTOMY    . CARDIAC CATHETERIZATION N/A 07/22/2015   Procedure: Left Heart Cath and Coronary Angiography;  Surgeon: Leonie Man, MD;  Location: [facility];  Service: Cardiovascular;  Laterality: N/A;  . CARDIAC CATHETERIZATION N/A 07/22/2015   Procedure: Coronary Stent Intervention;  Surgeon: Leonie Man, MD;  Location: [facility];   Service: Cardiovascular;  Laterality: N/A;  . CORONARY STENT PLACEMENT  07/22/2015   mid lad  des   . EYE SURGERY     cataracts bilateral  . HEMORRHOID SURGERY    . INGUINAL HERNIA REPAIR  10/11/2011   Procedure: HERNIA REPAIR INGUINAL ADULT;  Surgeon: Stark Klein, MD;  Location: [facility];  Service: General;  Laterality: Right;  . OPEN SURGICAL REPAIR OF GLUTEAL TENDON Left 03/12/2014   Procedure: LEFT HIP BURSECTOMY AND GLUTEAL TENDON REPAIR;  Surgeon: [name], MD;  Location: [facility];  Service: Orthopedics;  Laterality: Left;    Family History  Problem Relation Age of Onset  . Heart disease Father   . Lung cancer Sister   . Lung cancer Brother     Social History   Socioeconomic History  . Marital status: Widowed    Spouse name: Not on file  . Number of children: Not on file  . Years of education: Not on file  . Highest education level: Not on file  Occupational History  . Not on file  Tobacco Use  . Smoking status: Never Smoker  . Smokeless tobacco: Never Used  Substance and Sexual Activity  . Alcohol use: No  . Drug use: No  . Sexual activity: Never  Other  Topics Concern  . Not on file  Social History Narrative  . Not on file   Social Determinants of Health   Financial Resource Strain:   . Difficulty of Paying Living Expenses:   Food Insecurity:   . Worried About Charity fundraiser in the Last Year:   . Arboriculturist in the Last Year:   Transportation Needs:   . Film/video editor (Medical):   Marland Kitchen Lack of Transportation (Non-Medical):   Physical Activity:   . Days of Exercise per Week:   . Minutes of Exercise per Session:   Stress:   . Feeling of Stress :   Social Connections:   . Frequency of Communication with Friends and Family:   . Frequency of Social Gatherings with Friends and Family:   . Attends Religious Services:   . Active Member of Clubs or Organizations:   . Attends Archivist Meetings:   Marland Kitchen Marital Status:   Intimate  Partner Violence:   . Fear of Current or Ex-Partner:   . Emotionally Abused:   Marland Kitchen Physically Abused:   . Sexually Abused:     Outpatient Medications Prior to Visit  Medication Sig Dispense Refill  . aspirin EC 81 MG tablet Take 81 mg by mouth daily.    . Cyanocobalamin (VITAMIN B12 PO) Take 1 tablet by mouth daily.    . diclofenac sodium (VOLTAREN) 1 % GEL Apply 2 g topically 4 (four) times daily. 100 g 1  . fluticasone (FLONASE) 50 MCG/ACT nasal spray USE TWO SPRAY IN EACH NOSTRIL EVERY DAY 50 g 1  . hydrocortisone cream 1 % Apply 1 application topically 2 (two) times daily. (Patient taking differently: Apply 1 application at bedtime topically. ) 30 g 0  . meclizine (ANTIVERT) 25 MG tablet Take 1 tablet (25 mg total) by mouth 3 (three) times daily as needed for dizziness. Do not take with zofran 30 tablet 0  . metoprolol tartrate (LOPRESSOR) 25 MG tablet Take 1 tablet (25 mg total) by mouth 2 (two) times daily. 30 tablet 9  . Multiple Vitamins-Minerals (OCUVITE PO) Take 1 tablet by mouth daily.    . nitroGLYCERIN (NITROSTAT) 0.4 MG SL tablet Place 1 tablet (0.4 mg total) under the tongue every 5 (five) minutes as needed for chest pain. 25 tablet 3  . ondansetron (ZOFRAN) 4 MG tablet Take 1 tablet (4 mg total) by mouth every 6 (six) hours. 12 tablet 0  . Polyethyl Glycol-Propyl Glycol (SYSTANE OP) Apply 1 drop to eye 3 (three) times daily as needed (Dry eyes).    . ranolazine (RANEXA) 500 MG 12 hr tablet Take 1 tablet (500 mg total) by mouth 2 (two) times daily. 60 tablet 3  . senna-docusate (SENOKOT-S) 8.6-50 MG tablet Take 1 tablet by mouth at bedtime as needed for mild constipation or moderate constipation. 20 tablet 0  . triamcinolone ointment (KENALOG) 0.1 % Apply 1 application topically 2 (two) times daily. 30 g 0   Facility-Administered Medications Prior to Visit  Medication Dose Route Frequency Provider Last Rate Last Admin  . lidocaine (PF) (XYLOCAINE) 1 % injection 2 mL  2 mL  Intradermal Once Annie Main, FNP        Allergies  Allergen Reactions  . Chlorhexidine Itching  . Vancomycin Hives  . Aspirin Other (See Comments)    Nose bleeds with full strength  . Atorvastatin Other (See Comments)    Muscle pain  . Bactrim [Sulfamethoxazole-Trimethoprim] Other (See Comments)  Doesn't remember   . Cephalosporins Hives and Itching  . Ciprofloxacin Nausea Only  . Clarithromycin Other (See Comments)    Doesn't remember   . Colestipol Hcl Other (See Comments)    Hallucinations  . Ezetimibe Other (See Comments)    GI upset  . Prednisone   . Simvastatin Other (See Comments)    Muscle spasms  . Penicillins Rash    Has patient had a PCN reaction causing immediate rash, facial/tongue/throat swelling, SOB or lightheadedness with hypotension: yes Has patient had a PCN reaction causing severe rash involving mucus membranes or skin necrosis: no Has patient had a PCN reaction that required hospitalization no Has patient had a PCN reaction occurring within the last 10 years: no If all of the above answers are "NO", then may proceed with Cephalosporin use.   . Tape Rash    ROS Review of Systems  All other systems reviewed and are negative.     Objective:    Physical Exam Vitals and nursing note reviewed. Exam conducted with a chaperone present.  Constitutional:      Appearance: Normal appearance.  HENT:     Head: Normocephalic.     Right Ear: External ear normal.     Left Ear: External ear normal.  Eyes:     General: No scleral icterus.    Extraocular Movements: Extraocular movements intact.     Conjunctiva/sclera: Conjunctivae normal.     Pupils: Pupils are equal, round, and reactive to light.  Cardiovascular:     Rate and Rhythm: Normal rate.  Pulmonary:     Effort: Pulmonary effort is normal.  Chest:     Breasts:        Right: No swelling, bleeding, inverted nipple, mass, nipple discharge, skin change or tenderness.        Left: Tenderness  present. No swelling, bleeding, inverted nipple, mass, nipple discharge or skin change.       Comments: Hard surface did not feel a lump that pt can feel, no nipple discharge Musculoskeletal:     Cervical back: Normal range of motion and neck supple.     Right lower leg: No edema.     Left lower leg: No edema.       Legs:     Comments: Green drawn area lump with surrounding brown healing bruise  Skin:    General: Skin is warm and dry.     Coloration: Skin is not jaundiced.     Findings: No bruising.  Neurological:     General: No focal deficit present.     Mental Status: She is alert and oriented to person, place, and time.  Psychiatric:        Mood and Affect: Mood normal.        Behavior: Behavior normal.        Thought Content: Thought content normal.        Judgment: Judgment normal.     BP 138/72 (BP Location: Left Arm, Patient Position: Sitting, Cuff Size: Normal)   Pulse 73   Temp 97.6 F (36.4 C) (Temporal)   Resp 18   Wt 151 lb 9.6 oz (68.8 kg)   SpO2 96%   BMI 23.74 kg/m  Wt Readings from Last 3 Encounters:  09/02/19 151 lb 9.6 oz (68.8 kg)  08/10/19 150 lb (68 kg)  07/30/19 152 lb 9.6 oz (69.2 kg)     Health Maintenance Due  Topic Date Due  . URINE MICROALBUMIN  Never done  . COVID-19  Vaccine (1) Never done  . TETANUS/TDAP  06/26/2013    There are no preventive care reminders to display for this patient.  Lab Results  Component Value Date   TSH 2.22 07/12/2015   Lab Results  Component Value Date   WBC 8.3 03/17/2019   HGB 14.3 03/17/2019   HCT 45.1 03/17/2019   MCV 89.1 03/17/2019   PLT 240 03/17/2019   Lab Results  Component Value Date   NA 140 03/17/2019   K 4.2 03/17/2019   CO2 28 03/17/2019   GLUCOSE 116 (H) 03/17/2019   BUN 18 03/17/2019   CREATININE 0.96 03/17/2019   BILITOT 0.4 10/24/2018   ALKPHOS 64 12/23/2012   AST 15 10/24/2018   ALT 11 10/24/2018   PROT 7.2 10/24/2018   ALBUMIN 3.6 12/23/2012   CALCIUM 9.0 03/17/2019     ANIONGAP 10 03/17/2019   Lab Results  Component Value Date   CHOL 300 (H) 11/05/2016   Lab Results  Component Value Date   HDL 46 (L) 11/05/2016   Lab Results  Component Value Date   LDLCALC 210 (H) 11/05/2016   Lab Results  Component Value Date   TRIG 249 (H) 11/05/2016   Lab Results  Component Value Date   CHOLHDL 6.5 (H) 11/05/2016   Lab Results  Component Value Date   HGBA1C (H) 02/10/2010    6.3 (NOTE)                                                                       According to the ADA Clinical Practice Recommendations for 2011, when HbA1c is used as a screening test:   >=6.5%   Diagnostic of Diabetes Mellitus           (if abnormal result  is confirmed)  5.7-6.4%   Increased risk of developing Diabetes Mellitus  References:Diagnosis and Classification of Diabetes Mellitus,Diabetes RUEA,5409,81(XBJYN 1):S62-S69 and Standards of Medical Care in         Diabetes - 2011,Diabetes Care,2011,34  (Suppl 1):S11-S61.      Assessment & Plan:   Problem List Items Addressed This Visit    None    Visit Diagnoses    Left breast lump    -  Primary   Relevant Orders   MM Digital Diagnostic Unilat L   Localized swelling, mass, or lump of right lower extremity       Relevant Orders   US Venous Img Lower Unilateral Right      U/s doppler to r/o dvt of RLL Mammogram diagnostic of left breast  Follow-up: Return if symptoms worsen or fail to improve.    Annie Main, FNP

## 2019-09-04 ENCOUNTER — Ambulatory Visit: Payer: Medicare Other

## 2019-09-08 DIAGNOSIS — Z0120 Encounter for dental examination and cleaning without abnormal findings: Secondary | ICD-10-CM | POA: Diagnosis not present

## 2019-09-09 ENCOUNTER — Other Ambulatory Visit: Payer: Self-pay

## 2019-09-09 ENCOUNTER — Ambulatory Visit (HOSPITAL_COMMUNITY)
Admission: RE | Admit: 2019-09-09 | Discharge: 2019-09-09 | Disposition: A | Discharge status: Home / Self Care | Payer: Medicare Other | Source: Ambulatory Visit | Attending: Nurse Practitioner | Admitting: Nurse Practitioner

## 2019-09-09 DIAGNOSIS — R2241 Localized swelling, mass and lump, right lower limb: Secondary | ICD-10-CM | POA: Insufficient documentation

## 2019-09-09 DIAGNOSIS — R600 Localized edema: Secondary | ICD-10-CM | POA: Diagnosis not present

## 2019-09-09 DIAGNOSIS — M79604 Pain in right leg: Secondary | ICD-10-CM | POA: Diagnosis not present

## 2019-09-22 ENCOUNTER — Ambulatory Visit (HOSPITAL_COMMUNITY)
Admission: RE | Admit: 2019-09-22 | Discharge: 2019-09-22 | Disposition: A | Discharge status: Home / Self Care | Payer: Medicare Other | Source: Ambulatory Visit | Attending: Nurse Practitioner | Admitting: Nurse Practitioner

## 2019-09-22 ENCOUNTER — Other Ambulatory Visit: Payer: Self-pay

## 2019-09-22 DIAGNOSIS — N6324 Unspecified lump in the left breast, lower inner quadrant: Secondary | ICD-10-CM | POA: Diagnosis not present

## 2019-09-22 DIAGNOSIS — N6320 Unspecified lump in the left breast, unspecified quadrant: Secondary | ICD-10-CM

## 2019-09-22 DIAGNOSIS — N6489 Other specified disorders of breast: Secondary | ICD-10-CM | POA: Diagnosis not present

## 2019-09-22 DIAGNOSIS — R922 Inconclusive mammogram: Secondary | ICD-10-CM | POA: Diagnosis not present

## 2019-10-14 DIAGNOSIS — L821 Other seborrheic keratosis: Secondary | ICD-10-CM | POA: Diagnosis not present

## 2019-10-14 DIAGNOSIS — L648 Other androgenic alopecia: Secondary | ICD-10-CM | POA: Diagnosis not present

## 2019-11-14 ENCOUNTER — Other Ambulatory Visit: Payer: Medicare Other

## 2019-11-14 ENCOUNTER — Other Ambulatory Visit: Payer: Self-pay

## 2019-11-14 DIAGNOSIS — Z20822 Contact with and (suspected) exposure to covid-19: Secondary | ICD-10-CM

## 2019-11-16 ENCOUNTER — Telehealth: Payer: Self-pay

## 2019-11-16 ENCOUNTER — Telehealth: Payer: Self-pay | Admitting: Family Medicine

## 2019-11-16 ENCOUNTER — Other Ambulatory Visit (HOSPITAL_COMMUNITY): Payer: Self-pay | Admitting: Nurse Practitioner

## 2019-11-16 DIAGNOSIS — U071 COVID-19: Secondary | ICD-10-CM

## 2019-11-16 DIAGNOSIS — I422 Other hypertrophic cardiomyopathy: Secondary | ICD-10-CM

## 2019-11-16 DIAGNOSIS — I10 Essential (primary) hypertension: Secondary | ICD-10-CM

## 2019-11-16 LAB — SARS-COV-2, NAA 2 DAY TAT

## 2019-11-16 LAB — NOVEL CORONAVIRUS, NAA: SARS-CoV-2, NAA: DETECTED — AB

## 2019-11-16 NOTE — Telephone Encounter (Signed)
Pt tested positive for Covid and sx started last Thursday. Called the infusion hotline and left message.

## 2019-11-16 NOTE — Telephone Encounter (Signed)
CB# [phone] Pt has tested positive for Covid was told she may need a infusion need to call her primary doctor to have infusion schedule

## 2019-11-16 NOTE — Progress Notes (Signed)
I connected by phone with Lauren House on 11/16/2019 at 9:15 PM to discuss the potential use of a new treatment for mild to moderate COVID-19 viral infection in non-hospitalized patients.  This patient is a 84 y.o. female that meets the FDA criteria for Emergency Use Authorization of COVID monoclonal antibody casirivimab/imdevimab.  Has a (+) direct SARS-CoV-2 viral test result  Has mild or moderate COVID-19   Is NOT hospitalized due to COVID-19  Is within 10 days of symptom onset  Has at least one of the high risk factor(s) for progression to severe COVID-19 and/or hospitalization as defined in EUA.  Specific high risk criteria : Older age (>/= 82 yo) and Cardiovascular disease or hypertension   I have spoken and communicated the following to the patient or parent/caregiver regarding COVID monoclonal antibody treatment:  1. FDA has authorized the emergency use for the treatment of mild to moderate COVID-19 in adults and pediatric patients with positive results of direct SARS-CoV-2 viral testing who are 42 years of age and older weighing at least 40 kg, and who are at high risk for progressing to severe COVID-19 and/or hospitalization.  2. The significant known and potential risks and benefits of COVID monoclonal antibody, and the extent to which such potential risks and benefits are unknown.  3. Information on available alternative treatments and the risks and benefits of those alternatives, including clinical trials.  4. Patients treated with COVID monoclonal antibody should continue to self-isolate and use infection control measures (e.g., wear mask, isolate, social distance, avoid sharing personal items, clean and disinfect "high touch" surfaces, and frequent handwashing) according to CDC guidelines.   5. The patient or parent/caregiver has the option to accept or refuse COVID monoclonal antibody treatment.  After reviewing this information with the patient, The patient agreed to  proceed with receiving casirivimab\imdevimab infusion and will be provided a copy of the Fact sheet prior to receiving the infusion. [name] 11/16/2019 9:15 PM

## 2019-11-17 ENCOUNTER — Telehealth: Payer: Self-pay | Admitting: Nurse Practitioner

## 2019-11-17 ENCOUNTER — Ambulatory Visit (HOSPITAL_COMMUNITY): Payer: Medicare Other

## 2019-11-17 ENCOUNTER — Other Ambulatory Visit: Payer: Self-pay | Admitting: Nurse Practitioner

## 2019-11-26 NOTE — Telephone Encounter (Signed)
Unable to obtain infusion as time period has passed.

## 2019-11-30 DIAGNOSIS — M65332 Trigger finger, left middle finger: Secondary | ICD-10-CM | POA: Diagnosis not present

## 2019-11-30 DIAGNOSIS — M13842 Other specified arthritis, left hand: Secondary | ICD-10-CM | POA: Diagnosis not present

## 2019-11-30 DIAGNOSIS — U071 COVID-19: Secondary | ICD-10-CM | POA: Diagnosis not present

## 2019-11-30 DIAGNOSIS — M13849 Other specified arthritis, unspecified hand: Secondary | ICD-10-CM | POA: Diagnosis not present

## 2019-12-15 NOTE — Progress Notes (Signed)
Chart review for [facility]- erroneous encounter.

## 2019-12-18 ENCOUNTER — Ambulatory Visit: Payer: Medicare Other | Admitting: Internal Medicine

## 2019-12-18 ENCOUNTER — Encounter: Payer: Self-pay | Admitting: Internal Medicine

## 2019-12-18 ENCOUNTER — Other Ambulatory Visit: Payer: Self-pay

## 2019-12-18 VITALS — BP 136/70 | HR 71 | Ht 66.0 in | Wt 149.0 lb

## 2019-12-18 DIAGNOSIS — I422 Other hypertrophic cardiomyopathy: Secondary | ICD-10-CM | POA: Diagnosis not present

## 2019-12-18 DIAGNOSIS — E782 Mixed hyperlipidemia: Secondary | ICD-10-CM | POA: Diagnosis not present

## 2019-12-18 DIAGNOSIS — I83893 Varicose veins of bilateral lower extremities with other complications: Secondary | ICD-10-CM

## 2019-12-18 DIAGNOSIS — I2510 Atherosclerotic heart disease of native coronary artery without angina pectoris: Secondary | ICD-10-CM | POA: Diagnosis not present

## 2019-12-18 DIAGNOSIS — Z9861 Coronary angioplasty status: Secondary | ICD-10-CM

## 2019-12-18 DIAGNOSIS — E7849 Other hyperlipidemia: Secondary | ICD-10-CM

## 2019-12-18 DIAGNOSIS — M791 Myalgia, unspecified site: Secondary | ICD-10-CM

## 2019-12-18 DIAGNOSIS — T466X5A Adverse effect of antihyperlipidemic and antiarteriosclerotic drugs, initial encounter: Secondary | ICD-10-CM

## 2019-12-18 LAB — LIPID PANEL
Chol/HDL Ratio: 6.3 ratio — ABNORMAL HIGH (ref 0.0–4.4)
Cholesterol, Total: 326 mg/dL — ABNORMAL HIGH (ref 100–199)
HDL: 52 mg/dL (ref >39)
LDL Chol Calc (NIH): 224 mg/dL — ABNORMAL HIGH (ref 0–99)
Triglycerides: 248 mg/dL — ABNORMAL HIGH (ref 0–149)
VLDL Cholesterol Cal: 50 mg/dL — ABNORMAL HIGH (ref 5–40)

## 2019-12-18 NOTE — Patient Instructions (Addendum)
Medication Instructions:  Dr. Debara Pickett may prescribe a PCSK9i - injectable cholesterol medication (based on lab results)  *If you need a refill on your cardiac medications before your next appointment, please call your pharmacy*   Lab Work: LIPID PANEL TODAY  If you have labs (blood work) drawn today and your tests are completely normal, you will receive your results only by: Marland Kitchen MyChart Message (if you have MyChart) OR . A paper copy in the mail If you have any lab test that is abnormal or we need to change your treatment, we will call you to review the results.   Testing/Procedures: NONE   Follow-Up: At [facility], you and your health needs are our priority.  As part of our continuing mission to provide you with exceptional heart care, we have created designated Provider Care Teams.  These Care Teams include your primary Cardiologist (physician) and Advanced Practice Providers (APPs -  Physician Assistants and Nurse Practitioners) who all work together to provide you with the care you need, when you need it.  We recommend signing up for the patient portal called "MyChart".  Sign up information is provided on this After Visit Summary.  MyChart is used to connect with patients for Virtual Visits (Telemedicine).  Patients are able to view lab/test results, encounter notes, upcoming appointments, etc.  Non-urgent messages can be sent to your provider as well.   To learn more about what you can do with MyChart, go to [email or web address].    Your next appointment:   4 month(s)  The format for your next appointment:   In Person  Provider:   You may see Dr. Debara Pickett or one of the following Advanced Practice Providers on your designated Care Team:    Almyra Deforest, PA-C  Fabian Sharp, [place] or   Roby Lofts, [place]    Other Instructions Dr. Debara Pickett recommends KNEE HIGH COMPRESSION STOCKINGS  -- 20-30 mmHg (compression strength) -- [facility]  -- [address]  -- [place]. [place]   -- [place]  -- [address] [place]  -- [phone]   How to Use Compression Stockings Compression stockings are elastic socks that squeeze the legs. They help to increase blood flow to the legs, decrease swelling in the legs, and reduce the chance of developing blood clots in the lower legs. Compression stockings are often used by people who:  Are recovering from surgery.  Have poor circulation in their legs.  Are prone to getting blood clots in their legs.  Have varicose veins.  Sit or stay in bed for long periods of time. How to use compression stockings Before you put on your compression stockings:  Make sure that they are the correct size. If you do not know your size, ask your health care provider.  Make sure that they are clean, dry, and in good condition.  Check them for rips and tears. Do not put them on if they are ripped or torn. Put your stockings on first thing in the morning, before you get out of bed. Keep them on for as long as your health care provider advises. When you are wearing your stockings:  Keep them as smooth as possible. Do not allow them to bunch up. It is especially important to prevent the stockings from bunching up around your toes or behind your knees.  Do not roll the stockings downward and leave them rolled  down. This can decrease blood flow to your leg.  Change them right away if they become wet or dirty. When you take off your stockings, inspect your legs and feet. Anything that does not seem normal may require medical attention. Look for:  Open sores.  Red spots.  Swelling. Information and tips  Do not stop wearing your compression stockings without talking to your health care provider first.  Wash your stockings every day with mild detergent in cold or warm water. Do not use bleach.  Air-dry your stockings or dry them in a clothes dryer on low heat.  Replace your stockings every 3-6 months.  If skin moisturizing is part of your treatment plan, apply lotion or cream at night so that your skin will be dry when you put on the stockings in the morning. It is harder to put the stockings on when you have lotion on your legs or feet. Contact a health care provider if: Remove your stockings and seek medical care if:  You have a feeling of pins and needles in your feet or legs.  You have any new changes in your skin.  You have skin lesions that are getting worse.  You have swelling or pain that is getting worse. Get help right away if:  You have numbness or tingling in your lower legs that does not get better right after you take the stockings off.  Your toes or feet become cold and blue.  You develop open sores or red spots on your legs that do not go away.  You see or feel a warm spot on your leg.  You have new swelling or soreness in your leg.  You are short of breath or you have chest pain for no reason.  You have a rapid or irregular heartbeat.  You feel light-headed or dizzy. This information is not intended to replace advice given to you by your health care provider. Make sure you discuss any questions you have with your health care provider. Document Released: 12/10/2008 Document Revised: 07/13/2015 Document Reviewed: 01/20/2014 Elsevier Interactive Patient Education  2017 [employer].

## 2019-12-18 NOTE — Progress Notes (Signed)
OFFICE NOTE  Chief Complaint:  Leg pain, recent COVID  Primary Care Physician: Susy Frizzle, MD  HPI:  Lauren House is a pleasant 84 year old female I have been following for history of apical hypertrophic cardiomyopathy for which she has done well on Ranexa 500 mg daily. Recently, she was having right inguinal pain and had a hernia for which she had repair. That, unfortunately, she says has caused her more harm than good and she continues to have more pain in her right hip area which possibly is related to the hip. She saw Dr. Wynelle Link at [facility] for further evaluation from that and has received 2 hip injections which have been beneficial. He did mention that he felt she might need a hip replacement. Recently she had a recurrent episode of chest pain. This caused her to the hospital and improved with nitroglycerin. She was ruled out for MI and discharged. Unfortunately she returned several days later after she developed swelling, warmth and pain in her left forearm. It was felt that she might have possibly had an infected IV site and clearly had superficial thrombophlebitis. She was placed on antibiotics for this and is being treated with some improvement in her symptoms. After her emergency room visit, it was recommended that she increase her Ranexa 500 mg twice daily which is the recommended starting dose. She's had no further chest pain episodes. She had prior cardiac catheterization in 2011 which demonstrated a 50-60% LAD lesion. She underwent FFR for this which was negative with an FFR of 0.99. She had had a stress test at that time which indicated no evidence of reversible ischemia., but was having persistent chest pain.  It is been felt that her chest pain was due to small vessel disease in the setting of significant apical variant hypertrophic cardiomyopathy. This has been treated most effectively with ranolazine, and nitrates to some extent. Although her symptoms now  have escalated. This is concerning to me for possible worsening of her LAD disease.  Lauren House underwent a nuclear stress test on 02/03/2013. This was negative for ischemia with an EF of 52%. Since then she has reported some improvement in her symptoms. Her main complaints now have to do with left hip pain.  She is still contemplating surgery. She reported that her husband was recently diagnosed with esophageal cancer and it seems to be quite advanced. There did not appear to be any surgical options. This has caused her significant anxiety but she reports no chest pain.  Lauren House returns today for follow-up. Unfortunately she mentioned that her husband died about 3 months ago. He was diagnosed with esophageal cancer which was aggressive and inoperable. She is still grieving which is to be expected. She continues to have problems with her hip and at this point apparently will need hip surgery, but probably not hip replacement. That is scheduled about one month from now. She does have a history of moderate LAD stenosis in 2011 with a negative stress test one year ago. Recently, she has had worsening shortness of breath and chest pressure with exertion. She initially felt this was due to stress however her symptoms have been somewhat progressive and concern me for ischemia. Her EKG shows stable T-wave inversions consistent with her hypertrophic cardiomyopathy.  I saw Lauren House back in the office today. Overall she feels like she is doing well. She did have some mild chest discomfort last week and took a nitroglycerin. This is when she was in [place] with  her son and daughter-in-law and they were walking all over town. She managed to walk for several hours without any significant shortness of breath but did have some mild chest discomfort which was relieved fairly quickly with nitroglycerin. Overall she is doing well. She had her hip replacement on the left side which she is recovering from. She still has  discomfort when sitting in a car for long periods of time. She tells me that she's given a be going up to [place] fairly shortly on a vacation and that they will be going up to [facility] for the first time.'  Lauren House returns today for follow-up. She tells me on Saturday night she was awakened early in the morning with palpitations and a warm feeling as well as heaviness in her chest. She says it was quite intense. She denies any vivid dreams prior to that. She's not had any further episodes of chest pain since that or recurrent palpitations. She took a nitroglycerin and the symptoms resolved fairly promptly. She was able to go back to bed. Her EKGs is unchanged showing the T-wave inversions typical of apical variant hypertrophic cardiomyopathy. Her last stress test was in 2014 it was negative for ischemia. She does have a known 50-60% LAD stenosis which was negative by FFR.   07/12/2015  Lauren House was seen today in the office for follow-up. Recently she went to vacation with her family and they noticed more progressive shortness of breath and chest pressure. This was keeping her from doing certain activities. She says her symptoms of gotten worse since her last office visit in December. At that time I performed a nuclear stress test which showed a low normal EF 51% but no reversible ischemia. She does have known 50-60% LAD stenosis which was negative by FFR by catheterization in 2012.  08/26/2015  Lauren House returns today for follow-up. She underwent cardiac catheterization for progressive chest pain concerning for unstable angina on 07/22/2015 by Dr. Ellyn Hack, this demonstrated the following:  1. Mid LAD lesion, 80% stenosed. Post intervention with Promus DES 2.25 mm x 16 mm (2.4 mm), there is a 0% residual stenosis. 2. Ost LAD lesion, 50% stenosed. stable from original catheterization 3. The left ventricular systolic function is normal. with minimal apical filling consistent with known  apical hypertrophy. 4. Otherwise mild to moderate disease in the circumflex and RCA system  Since her coronary angiography and stent placement, she reports she is doing fairly well. She started back doing some exercise and push mowed her lawn the other day. Unfortunate she developed a cracked tooth and she's been having problems with pain. She took antibiotics including doxycycline which causes her stomach upset. She has a temporary crown and will be going back for some more dental work. On questioning today she reports that she has discontinued her Plavix. She said she's been off of it for about one week because of spontaneous bruising. She was not counseled to discontinue this and I reiterated the fact today that being off of dual antiplatelet therapy could put her at risk for acute in-stent thrombosis which could lead to massive heart attack.  11/18/2015  Lauren House was seen today in follow-up. She has been undergoing cardiac rehabilitation. She restart her Plavix which is very important given her recent stent in May of this past year. She denies any chest pain but still gets short of breath. She says is fairly stable but not necessarily getting worse. She is now gone through 8 sessions rehabilitation and  is up to 3.4 metabolic equivalents. Blood pressures been fairly well-controlled.  05/22/2016  Lauren House returns today for follow-up. When I last saw her she was undergoing cardiac rehabilitation and reported shortness of breath with exertion. This was shortly after her stenting I recommended continued cardiac rehabilitation. She says that after finishing rehabilitation she has not had any improvement in her shortness of breath and perhaps it may be worse. She notes it when walking up and down her stairs to her basement. She says after the second trip up the stairs she does get short of breath. She is under a lot of stress and there are some family issues which required her grandson to move in with  her. She denies any chest pain. Blood pressure was elevated today 161/85. She says it is always up in the office, however was 140/80 last time.  06/26/2016  Lauren House was seen today in follow-up. Her echocardiogram shows preserved systolic function, mild diastolic dysfunction and elevated LV filling pressure. We understand that she would have diastolic dysfunction secondary to apical hypertrophic cardiomyopathy. She continues to have shortness of breath with exertion. Her BNP is very low less than 100 and stable. I suspect her symptoms may be due to diastolic dysfunction elevated LVEDP. Blood pressure was over 735 systolic today. In addition, recent lipid profile indicated 2 cholesterol 279, triglycerides 216, HDL 48 and LDL-C1 88. Given her coronary artery disease her goal LDL-C is less than 70. She has been intolerant to numerous statins including rosuvastatin, atorvastatin and pravastatin. She is close to one year since her drug-eluting stent in the LAD.  01/01/2017  Lauren House returns today for follow-up.  General she seems to be doing pretty well.  In May we referred her for evaluation to start a PC SK 9 inhibitor.  She had a discussion about starting Repatha however was concerned about cost since she did not have drug coverage.  She is also concerned about not taking the medicine.  She says she ultimately wants to be with her deceased husband.  That being said, she is willing to entertain the medication after I told her today that the cost of come down significantly.  Her cholesterol was recently assessed and LDL remains over 200 untreated.  She has been intolerant to statins as previously outlined in my notes.  Also mentions she has had shingles and 2 bouts of pneumonia since I last saw her.  04/10/2017  Lauren House was seen today in follow-up.  Recently she was seen by primary care provider for bronchitis/pharyngitis.  She was treated with azithromycin.  She still has some scratchiness in her  throat but is improving.  She reports she does get short of breath with exertion, worse than it was a couple years ago but generally has been stable.  She denies any angina.  She is thought about the injectable PCS K9 inhibitors, but declines it at this time.  Unfortunately, her son recently died after being found to have cancer.  12/18/2017  Lauren House is seen today in follow-up.  Recently she has been doing some traveling with her son.  She went to [place] and another religious museum.  They were walking all day and she did get short of breath but denied any chest pain with this.  Overall she is done fairly well.  She is a little nervous about the fact that her Ranexa is now going to be generic, but I reassured her the medication would  be very similar.  Blood pressure was well controlled today.  Her EKG appears unchanged.  Unfortunately she had a recent mammogram which was abnormal and they are concerned about a chest wall cyst.  She is concerned that this may be cancer.  12/18/2019  Lauren House is seen today in follow-up.  Unfortunately she recently had COVID-19.  She was symptomatic for several days.  She had been previously vaccinated.  She underwent antibody infusion therapy and got better much quicker.  She is also been complaining of some leg pain.  She has bilateral varicose veins but has not used compression stockings.  EKG shows no significant new changes.  She denies any new chest pain.  She still has a persistently elevated cholesterol.  She has not been interested in additional therapies in the past and was intolerant to statins.  Her LDL was 210 in 2018 with a total cholesterol 300.  She does not eat a high cholesterol or saturated fat diet and therefore most likely has a familial hyperlipidemia.  PMHx:  Past Medical History:  Diagnosis Date  . Anxiety   . Apical variant hypertrophic cardiomyopathy ([place])   . Arthritis   . Borderline diabetes mellitus   .  Bruises easily   . Bursitis of left hip   . Coronary atherosclerosis of native coronary artery    a. 2011: Cath showing nonobstructive CAD - 50-60% LAD (normal FFR) b. 06/2015: Cath showing 80% stenosis in the Mid LAD, tx w/ Promus DES 2.25 mm x 16 mm   . Essential hypertension, benign   . History of kidney stones   . History of skin cancer    Basal cell - abdomen  . Hyperlipidemia    Diet controlled.cannot take meds  . Nocturia   . Osteoporosis   . Overactive bladder   . Shortness of breath dyspnea   . Vertigo     Past Surgical History:  Procedure Laterality Date  . ABDOMINAL HYSTERECTOMY    . CARDIAC CATHETERIZATION N/A 07/22/2015   Procedure: Left Heart Cath and Coronary Angiography;  Surgeon: Leonie Man, MD;  Location: [facility];  Service: Cardiovascular;  Laterality: N/A;  . CARDIAC CATHETERIZATION N/A 07/22/2015   Procedure: Coronary Stent Intervention;  Surgeon: Leonie Man, MD;  Location: [facility];  Service: Cardiovascular;  Laterality: N/A;  . CORONARY STENT PLACEMENT  07/22/2015   mid lad  des   . EYE SURGERY     cataracts bilateral  . HEMORRHOID SURGERY    . INGUINAL HERNIA REPAIR  10/11/2011   Procedure: HERNIA REPAIR INGUINAL ADULT;  Surgeon: Stark Klein, MD;  Location: [facility];  Service: General;  Laterality: Right;  . OPEN SURGICAL REPAIR OF GLUTEAL TENDON Left 03/12/2014   Procedure: LEFT HIP BURSECTOMY AND GLUTEAL TENDON REPAIR;  Surgeon: [name], MD;  Location: [facility];  Service: Orthopedics;  Laterality: Left;    FAMHx:  Family History  Problem Relation Age of Onset  . Heart disease Father   . Lung cancer Sister   . Lung cancer Brother     SOCHx:   reports that she has never smoked. She has never used smokeless tobacco. She reports that she does not drink alcohol and does not use drugs.  ALLERGIES:  Allergies  Allergen Reactions  . Chlorhexidine Itching  . Vancomycin Hives  . Aspirin Other (See Comments)    Nose  bleeds with full strength  . Atorvastatin Other (See Comments)    Muscle pain  .  Bactrim [Sulfamethoxazole-Trimethoprim] Other (See Comments)    Doesn't remember   . Cephalosporins Hives and Itching  . Ciprofloxacin Nausea Only  . Clarithromycin Other (See Comments)    Doesn't remember   . Colestipol Hcl Other (See Comments)    Hallucinations  . Ezetimibe Other (See Comments)    GI upset  . Prednisone   . Simvastatin Other (See Comments)    Muscle spasms  . Penicillins Rash    Has patient had a PCN reaction causing immediate rash, facial/tongue/throat swelling, SOB or lightheadedness with hypotension: yes Has patient had a PCN reaction causing severe rash involving mucus membranes or skin necrosis: no Has patient had a PCN reaction that required hospitalization no Has patient had a PCN reaction occurring within the last 10 years: no If all of the above answers are "NO", then may proceed with Cephalosporin use.   . Tape Rash    ROS: Pertinent items noted in HPI and remainder of comprehensive ROS otherwise negative.  HOME MEDS: Current Outpatient Medications  Medication Sig Dispense Refill  . aspirin EC 81 MG tablet Take 81 mg by mouth daily.    . Cyanocobalamin (VITAMIN B12 PO) Take 1 tablet by mouth daily.    . diclofenac sodium (VOLTAREN) 1 % GEL Apply 2 g topically 4 (four) times daily. 100 g 1  . fluticasone (FLONASE) 50 MCG/ACT nasal spray USE TWO SPRAY IN EACH NOSTRIL EVERY DAY 50 g 1  . hydrocortisone cream 1 % Apply 1 application topically 2 (two) times daily. (Patient taking differently: Apply 1 application at bedtime topically. ) 30 g 0  . meclizine (ANTIVERT) 25 MG tablet Take 1 tablet (25 mg total) by mouth 3 (three) times daily as needed for dizziness. Do not take with zofran 30 tablet 0  . metoprolol tartrate (LOPRESSOR) 25 MG tablet Take 1 tablet (25 mg total) by mouth 2 (two) times daily. 30 tablet 9  . Multiple Vitamins-Minerals (OCUVITE PO) Take 1 tablet by  mouth daily.    . nitroGLYCERIN (NITROSTAT) 0.4 MG SL tablet Place 1 tablet (0.4 mg total) under the tongue every 5 (five) minutes as needed for chest pain. 25 tablet 3  . ondansetron (ZOFRAN) 4 MG tablet Take 1 tablet (4 mg total) by mouth every 6 (six) hours. 12 tablet 0  . Polyethyl Glycol-Propyl Glycol (SYSTANE OP) Apply 1 drop to eye 3 (three) times daily as needed (Dry eyes).    . ranolazine (RANEXA) 500 MG 12 hr tablet Take 1 tablet (500 mg total) by mouth 2 (two) times daily. 60 tablet 3  . senna-docusate (SENOKOT-S) 8.6-50 MG tablet Take 1 tablet by mouth at bedtime as needed for mild constipation or moderate constipation. 20 tablet 0  . triamcinolone ointment (KENALOG) 0.1 % Apply 1 application topically 2 (two) times daily. 30 g 0   Current Facility-Administered Medications  Medication Dose Route Frequency Provider Last Rate Last Admin  . lidocaine (PF) (XYLOCAINE) 1 % injection 2 mL  2 mL Intradermal Once Ishmael Holter A, FNP        LABS/IMAGING: No results found for this or any previous visit (from the past 48 hour(s)). No results found.  VITALS: BP 136/70 (BP Location: Left Arm, Patient Position: Sitting, Cuff Size: Normal)   Pulse 71   Ht 5\' 6"  (1.676 m)   Wt 149 lb (67.6 kg)   BMI 24.05 kg/m   EXAM: General appearance: alert and no distress Neck: no carotid bruit, no JVD and thyroid not enlarged, symmetric, no  tenderness/mass/nodules Lungs: clear to auscultation bilaterally Heart: regular rate and rhythm, S1, S2 normal, no murmur, click, rub or gallop Abdomen: soft, non-tender; bowel sounds normal; no masses,  no organomegaly Extremities: extremities normal, atraumatic, no cyanosis or edema Pulses: 2+ and symmetric Skin: Skin color, texture, turgor normal. No rashes or lesions Neurologic: Grossly normal Psych: Pleasant  EKG: Normal sinus rhythm at 71, moderate voltage criteria for LVH, anterior and lateral T wave changes-personally  reviewed  ASSESSMENT: 1. History of DOE - echo with normal systolic function, mild diastolic dysfunction and elevated LVEDP  2. Unstable angina - status post PCI with a Promus DES (2.25 mm x 16 mm to the mid LAD)- known 50-60% ostial LAD stenosis (FFR 0.99 in 2011) which was stable at repeat cath (06/2015) 3. Apical variant hypertrophic cardimyopathy - anterolateral TWI's - negative nuclear stress test 01-2015 4. Hypertension 5. Venous insufficiency 6. Right inguinal pain with a hernia, status post surgical repair 7. Right hip pain with degenerative hip disease - s/p surgery 8. Statin intolerance - myalgias 9. Probable Familial Hyperlipidemia (FH)  PLAN: 1.   Lauren House recently had [id number] but is recovering after antibiotic infusion.  She has had some pain in her legs bilateral I think this is likely due to venous insufficiency and varicose veins.  It sounds like she had phlebitis.  She underwent Dopplers which were negative for DVT.  I encouraged stocking wear.  In addition she most likely has a familial hyperlipidemia.  She has not been interested in any therapies.  She had previously tried statins and had intolerance including myalgias.  LDL was over 200 with total cholesterol over 300.  These labs are from 2018 and we will repeat them today.  Most likely would recommend a PCSK9 inhibitor.  Given her coronary disease her target LDL is less than 70 or if unreachable than a 50% reduction.   Plan follow-up in 4 months or sooner as necessary.  [name], MD, [facility], [place] Director of the Advanced Lipid Disorders &  Cardiovascular Risk Reduction Clinic Diplomate of the American Board of Clinical Lipidology Attending Cardiologist  Direct Dial: [phone]  Fax: [phone]  Website:  [email or web address] [email or web address] Lacole Komorowski 12/18/2019, 9:32 AM

## 2019-12-21 ENCOUNTER — Telehealth: Payer: Self-pay | Admitting: Internal Medicine

## 2019-12-21 NOTE — Telephone Encounter (Signed)
-----   Message from [name], MD sent at 12/18/2019  4:52 PM EDT ----- Very high cholesterol c/w FH - pursue PCSK9i.  Dr Lemmie Evens

## 2019-12-21 NOTE — Telephone Encounter (Signed)
Patient called w/lab results She states she thinks she will qualify for Repatha assistance from [place] She has NO part D drug coverage Explained that [employer] will provide med assistance for a short-term, but requires her to get part D coverage either after a few months or new calendar year She said that her insurance company told her she was better off NOT getting part D coverage and using GoodRx for med assistance/discount Explained that Repatha is likely $1000s without drug coverage - she said she would have to pray about it  Advised to complete [employer] application and will formulate plan going forward

## 2019-12-25 DIAGNOSIS — H5203 Hypermetropia, bilateral: Secondary | ICD-10-CM | POA: Diagnosis not present

## 2019-12-28 ENCOUNTER — Ambulatory Visit (INDEPENDENT_AMBULATORY_CARE_PROVIDER_SITE_OTHER): Payer: Medicare Other | Admitting: Family Medicine

## 2019-12-28 ENCOUNTER — Other Ambulatory Visit: Payer: Self-pay

## 2019-12-28 VITALS — BP 140/70 | HR 87 | Temp 97.9°F | Ht 66.0 in | Wt 148.0 lb

## 2019-12-28 DIAGNOSIS — M810 Age-related osteoporosis without current pathological fracture: Secondary | ICD-10-CM

## 2019-12-28 DIAGNOSIS — Z23 Encounter for immunization: Secondary | ICD-10-CM

## 2019-12-28 DIAGNOSIS — E7800 Pure hypercholesterolemia, unspecified: Secondary | ICD-10-CM | POA: Diagnosis not present

## 2019-12-28 DIAGNOSIS — Z0001 Encounter for general adult medical examination with abnormal findings: Secondary | ICD-10-CM | POA: Diagnosis not present

## 2019-12-28 DIAGNOSIS — Z0000 Encounter for general adult medical examination without abnormal findings: Secondary | ICD-10-CM

## 2019-12-28 DIAGNOSIS — I2510 Atherosclerotic heart disease of native coronary artery without angina pectoris: Secondary | ICD-10-CM | POA: Diagnosis not present

## 2019-12-28 DIAGNOSIS — I2583 Coronary atherosclerosis due to lipid rich plaque: Secondary | ICD-10-CM

## 2019-12-28 DIAGNOSIS — Z9861 Coronary angioplasty status: Secondary | ICD-10-CM

## 2019-12-28 MED ORDER — ALENDRONATE SODIUM 70 MG PO TABS: 70.0000 mg | ORAL_TABLET | ORAL | 11 refills | Status: DC

## 2019-12-28 NOTE — Progress Notes (Signed)
Subjective:    Patient ID: Lauren House, female    DOB: 18-Sep-1935, 84 y.o.   MRN: [id number]  HPI She is a very pleasant 84 year old white female who is here today for physical exam. Past medical history is significant for coronary artery disease status post percutaneous stenting.  She has a history of statin intolerance.  Her most recent cholesterol is listed below: Office Visit on 12/18/2019  Component Date Value Ref Range Status  . Cholesterol, Total 12/18/2019 326* 100 - 199 mg/dL Final  . Triglycerides 12/18/2019 248* 0 - 149 mg/dL Final  . HDL 12/18/2019 52  >39 mg/dL Final  . VLDL Cholesterol Cal 12/18/2019 50* 5 - 40 mg/dL Final  . LDL Chol Calc (NIH) 12/18/2019 224* 0 - 99 mg/dL Final  . Chol/HDL Ratio 12/18/2019 6.3* 0.0 - 4.4 ratio Final   Comment:                                   T. Chol/HDL Ratio                                             Men  Women                               1/2 Avg.Risk  3.4    3.3                                   Avg.Risk  5.0    4.4                                2X Avg.Risk  9.6    7.1                                3X Avg.Risk 23.4   11.0    Unfortunately she is unable to tolerate any statin.  Therefore her cardiologist is recommended Repatha.  She has some paperwork today for [place] to complete to try to help get this approved.  Based on her age she does not require colonoscopy or a Pap smear.  She is already had a mammogram earlier this year that was normal.  Earlier this year she also had COVID-19.  She has had 2 doses of the Materna vaccine.  She is due for her flu shot today.  She had a bone density test in 2018 which showed a T score worse than -3 in the right hip however she is on no medication for osteoporosis.  She is not sure why she is not taking this.  She is also not on calcium or vitamin D.  She denies any falls, memory loss, depression Past Medical History:  Diagnosis Date  . Anxiety   . Apical variant hypertrophic cardiomyopathy  ([place])   . Arthritis   . Borderline diabetes mellitus   . Bruises easily   . Bursitis of left hip   . Coronary atherosclerosis of native coronary artery    a. 2011: Cath showing nonobstructive CAD - 50-60% LAD (normal FFR) b. 06/2015: Cath  showing 80% stenosis in the Mid LAD, tx w/ Promus DES 2.25 mm x 16 mm   . Essential hypertension, benign   . History of kidney stones   . History of skin cancer    Basal cell - abdomen  . Hyperlipidemia    Diet controlled.cannot take meds  . Nocturia   . Osteoporosis   . Overactive bladder   . Shortness of breath dyspnea   . Vertigo    Past Surgical History:  Procedure Laterality Date  . ABDOMINAL HYSTERECTOMY    . CARDIAC CATHETERIZATION N/A 07/22/2015   Procedure: Left Heart Cath and Coronary Angiography;  Surgeon: Leonie Man, MD;  Location: [facility];  Service: Cardiovascular;  Laterality: N/A;  . CARDIAC CATHETERIZATION N/A 07/22/2015   Procedure: Coronary Stent Intervention;  Surgeon: Leonie Man, MD;  Location: [facility];  Service: Cardiovascular;  Laterality: N/A;  . CORONARY STENT PLACEMENT  07/22/2015   mid lad  des   . EYE SURGERY     cataracts bilateral  . HEMORRHOID SURGERY    . INGUINAL HERNIA REPAIR  10/11/2011   Procedure: HERNIA REPAIR INGUINAL ADULT;  Surgeon: Stark Klein, MD;  Location: [facility];  Service: General;  Laterality: Right;  . OPEN SURGICAL REPAIR OF GLUTEAL TENDON Left 03/12/2014   Procedure: LEFT HIP BURSECTOMY AND GLUTEAL TENDON REPAIR;  Surgeon: [name], MD;  Location: [facility];  Service: Orthopedics;  Laterality: Left;    Allergies  Allergen Reactions  . Chlorhexidine Itching  . Vancomycin Hives  . Aspirin Other (See Comments)    Nose bleeds with full strength  . Atorvastatin Other (See Comments)    Muscle pain  . Bactrim [Sulfamethoxazole-Trimethoprim] Other (See Comments)    Doesn't remember   . Cephalosporins Hives and Itching  . Ciprofloxacin Nausea Only  .  Clarithromycin Other (See Comments)    Doesn't remember   . Colestipol Hcl Other (See Comments)    Hallucinations  . Ezetimibe Other (See Comments)    GI upset  . Prednisone   . Simvastatin Other (See Comments)    Muscle spasms  . Penicillins Rash    Has patient had a PCN reaction causing immediate rash, facial/tongue/throat swelling, SOB or lightheadedness with hypotension: yes Has patient had a PCN reaction causing severe rash involving mucus membranes or skin necrosis: no Has patient had a PCN reaction that required hospitalization no Has patient had a PCN reaction occurring within the last 10 years: no If all of the above answers are "NO", then may proceed with Cephalosporin use.   . Tape Rash   Social History   Socioeconomic History  . Marital status: Widowed    Spouse name: Not on file  . Number of children: Not on file  . Years of education: Not on file  . Highest education level: Not on file  Occupational History  . Not on file  Tobacco Use  . Smoking status: Never Smoker  . Smokeless tobacco: Never Used  Substance and Sexual Activity  . Alcohol use: No  . Drug use: No  . Sexual activity: Never  Other Topics Concern  . Not on file  Social History Narrative  . Not on file   Social Determinants of Health   Financial Resource Strain:   . Difficulty of Paying Living Expenses: Not on file  Food Insecurity:   . Worried About Charity fundraiser in the Last Year: Not on file  . Ran Out of Food in  the Last Year: Not on file  Transportation Needs:   . Lack of Transportation (Medical): Not on file  . Lack of Transportation (Non-Medical): Not on file  Physical Activity:   . Days of Exercise per Week: Not on file  . Minutes of Exercise per Session: Not on file  Stress:   . Feeling of Stress : Not on file  Social Connections:   . Frequency of Communication with Friends and Family: Not on file  . Frequency of Social Gatherings with Friends and Family: Not on file    . Attends Religious Services: Not on file  . Active Member of Clubs or Organizations: Not on file  . Attends Archivist Meetings: Not on file  . Marital Status: Not on file  Intimate Partner Violence:   . Fear of Current or Ex-Partner: Not on file  . Emotionally Abused: Not on file  . Physically Abused: Not on file  . Sexually Abused: Not on file   Family History  Problem Relation Age of Onset  . Heart disease Father   . Lung cancer Sister   . Lung cancer Brother       Review of Systems  All other systems reviewed and are negative.      Objective:   Physical Exam Vitals reviewed.  Constitutional:      General: She is not in acute distress.    Appearance: She is well-developed. She is not diaphoretic.  HENT:     Right Ear: External ear normal.     Left Ear: External ear normal.     Nose: Nose normal.     Mouth/Throat:     Pharynx: No oropharyngeal exudate.  Eyes:     General: No scleral icterus.       Right eye: No discharge.        Left eye: No discharge.     Conjunctiva/sclera: Conjunctivae normal.     Pupils: Pupils are equal, round, and reactive to light.  Neck:     Thyroid: No thyromegaly.     Vascular: No JVD.     Trachea: No tracheal deviation.  Cardiovascular:     Rate and Rhythm: Normal rate and regular rhythm.     Heart sounds: Normal heart sounds. No murmur heard.  No friction rub. No gallop.   Pulmonary:     Effort: Pulmonary effort is normal. No respiratory distress.     Breath sounds: Normal breath sounds. No stridor. No wheezing or rales.  Chest:     Chest wall: No tenderness.  Abdominal:     General: Bowel sounds are normal. There is no distension.     Palpations: Abdomen is soft. There is no mass.     Tenderness: There is no abdominal tenderness. There is no guarding or rebound.  Musculoskeletal:        General: No tenderness or deformity. Normal range of motion.     Cervical back: Normal range of motion and neck supple.   Lymphadenopathy:     Cervical: No cervical adenopathy.  Skin:    General: Skin is warm.     Coloration: Skin is not pale.     Findings: No erythema or rash.  Neurological:     Mental Status: She is alert and oriented to person, place, and time.     Cranial Nerves: No cranial nerve deficit.     Motor: No abnormal muscle tone.     Coordination: Coordination normal.     Deep Tendon Reflexes: Reflexes are  normal and symmetric.  Psychiatric:        Behavior: Behavior normal.        Thought Content: Thought content normal.        Judgment: Judgment normal.           Assessment & Plan:  Pure hypercholesterolemia - Plan: CBC with Differential/Platelet, COMPLETE METABOLIC PANEL WITH GFR  Need for immunization against influenza - Plan: Flu Vaccine QUAD High Dose(Fluad)  Coronary artery disease due to lipid rich plaque  CAD S/P percutaneous coronary angioplasty  POSTMENOPAUSAL OSTEOPOROSIS  I will try to help get the patient scheduled for Repatha.  She received her flu shot today.  We discussed her T score on her bone density test in 2018.  I recommended 1200 mg a day of calcium, 1000 units a day of vitamin D, and Fosamax 70 mg weekly.  She is willing to try this.  She denies any falls, depression, or memory loss.  The remainder of her immunizations are up-to-date.  Her mammogram was performed earlier this year and is normal.  Meanwhile also check CBC and a CMP.

## 2019-12-29 LAB — COMPLETE METABOLIC PANEL WITH GFR
AG Ratio: 1.3 (calc) (ref 1.0–2.5)
ALT: 16 U/L (ref 6–29)
AST: 20 U/L (ref 10–35)
Albumin: 4.3 g/dL (ref 3.6–5.1)
Alkaline phosphatase (APISO): 55 U/L (ref 37–153)
BUN/Creatinine Ratio: 15 (calc) (ref 6–22)
BUN: 15 mg/dL (ref 7–25)
CO2: 29 mmol/L (ref 20–32)
Calcium: 9.7 mg/dL (ref 8.6–10.4)
Chloride: 101 mmol/L (ref 98–110)
Creat: 1.02 mg/dL — ABNORMAL HIGH (ref 0.60–0.88)
GFR, Est African American: 59 mL/min/{1.73_m2} — ABNORMAL LOW (ref >=60)
GFR, Est Non African American: 51 mL/min/{1.73_m2} — ABNORMAL LOW (ref >=60)
Globulin: 3.2 g/dL (calc) (ref 1.9–3.7)
Glucose, Bld: 112 mg/dL — ABNORMAL HIGH (ref 65–99)
Potassium: 4.4 mmol/L (ref 3.5–5.3)
Sodium: 139 mmol/L (ref 135–146)
Total Bilirubin: 0.6 mg/dL (ref 0.2–1.2)
Total Protein: 7.5 g/dL (ref 6.1–8.1)

## 2019-12-29 LAB — CBC WITH DIFFERENTIAL/PLATELET
Absolute Monocytes: 481 cells/uL (ref 200–950)
Basophils Absolute: 67 cells/uL (ref 0–200)
Basophils Relative: 0.9 %
Eosinophils Absolute: 118 cells/uL (ref 15–500)
Eosinophils Relative: 1.6 %
HCT: 43 % (ref 35.0–45.0)
Hemoglobin: 14.3 g/dL (ref 11.7–15.5)
Lymphs Abs: 2553 cells/uL (ref 850–3900)
MCH: 28.6 pg (ref 27.0–33.0)
MCHC: 33.3 g/dL (ref 32.0–36.0)
MCV: 86 fL (ref 80.0–100.0)
MPV: 10.4 fL (ref 7.5–12.5)
Monocytes Relative: 6.5 %
Neutro Abs: 4181 cells/uL (ref 1500–7800)
Neutrophils Relative %: 56.5 %
Platelets: 250 10*3/uL (ref 140–400)
RBC: 5 10*6/uL (ref 3.80–5.10)
RDW: 13.1 % (ref 11.0–15.0)
Total Lymphocyte: 34.5 %
WBC: 7.4 10*3/uL (ref 3.8–10.8)

## 2020-01-27 DIAGNOSIS — J029 Acute pharyngitis, unspecified: Secondary | ICD-10-CM | POA: Diagnosis not present

## 2020-02-02 ENCOUNTER — Ambulatory Visit: Payer: Medicare Other | Admitting: Family Medicine

## 2020-03-08 NOTE — Telephone Encounter (Signed)
Sent patient a Pharmacist, community message w/info on [employer] as funds are presently available

## 2020-03-10 ENCOUNTER — Other Ambulatory Visit: Payer: Self-pay | Admitting: Internal Medicine

## 2020-03-21 ENCOUNTER — Telehealth: Payer: Self-pay | Admitting: Internal Medicine

## 2020-03-21 NOTE — Telephone Encounter (Signed)
*  STAT* If patient is at the pharmacy, call can be transferred to refill team.   1. Which medications need to be refilled? (please list name of each medication and dose if known)  metoprolol tartrate (LOPRESSOR) 25 MG tablet [[id number]]    2. Which pharmacy/location (including street and city if local pharmacy) is medication to be sent to [place], [place] - [address]  [address] [place], [place] [address]  Phone:  [phone] Fax:  [phone]   3. Do they need a 30 day or 90 day supply? [place]

## 2020-03-22 MED ORDER — METOPROLOL TARTRATE 25 MG PO TABS: 25.0000 mg | ORAL_TABLET | Freq: Two times a day (BID) | ORAL | 9 refills | Status: DC

## 2020-03-22 NOTE — Telephone Encounter (Signed)
Rx has been sent to the pharmacy electronically. ° °

## 2020-03-30 ENCOUNTER — Other Ambulatory Visit: Payer: Self-pay | Admitting: Internal Medicine

## 2020-03-30 MED ORDER — METOPROLOL TARTRATE 25 MG PO TABS: 25.0000 mg | ORAL_TABLET | Freq: Two times a day (BID) | ORAL | 1 refills | Status: DC

## 2020-03-30 NOTE — Telephone Encounter (Signed)
Spoke with patient Lauren House, which was discussed during last office visit, recommended based off last labs. Patient never applied for assistance and states she is not interested at this time. She does have a visit on 04/20/20 which was scheduled in anticipation of following up on lipid med changes. She will keep appt at this time. She also expressed needing metoprolol tartrate refilled. Was sent to pharmacy on 03/22/20 but there were e-scricbing glitches last week and quantity sent on 1/25 was incorrect. Advised I have updated refills to [facility] for this medication

## 2020-04-20 ENCOUNTER — Other Ambulatory Visit: Payer: Self-pay

## 2020-04-20 ENCOUNTER — Ambulatory Visit: Payer: Medicare Other | Admitting: Internal Medicine

## 2020-04-20 VITALS — BP 134/76 | HR 59 | Ht 67.0 in | Wt 148.0 lb

## 2020-04-20 DIAGNOSIS — I2583 Coronary atherosclerosis due to lipid rich plaque: Secondary | ICD-10-CM

## 2020-04-20 DIAGNOSIS — E782 Mixed hyperlipidemia: Secondary | ICD-10-CM | POA: Diagnosis not present

## 2020-04-20 DIAGNOSIS — I422 Other hypertrophic cardiomyopathy: Secondary | ICD-10-CM | POA: Diagnosis not present

## 2020-04-20 DIAGNOSIS — Z9861 Coronary angioplasty status: Secondary | ICD-10-CM

## 2020-04-20 DIAGNOSIS — I2510 Atherosclerotic heart disease of native coronary artery without angina pectoris: Secondary | ICD-10-CM

## 2020-04-20 DIAGNOSIS — M791 Myalgia, unspecified site: Secondary | ICD-10-CM | POA: Diagnosis not present

## 2020-04-20 DIAGNOSIS — T466X5D Adverse effect of antihyperlipidemic and antiarteriosclerotic drugs, subsequent encounter: Secondary | ICD-10-CM

## 2020-04-20 DIAGNOSIS — E7849 Other hyperlipidemia: Secondary | ICD-10-CM

## 2020-04-20 DIAGNOSIS — T466X5A Adverse effect of antihyperlipidemic and antiarteriosclerotic drugs, initial encounter: Secondary | ICD-10-CM

## 2020-04-20 MED ORDER — RANOLAZINE ER 500 MG PO TB12: 500.0000 mg | ORAL_TABLET | Freq: Two times a day (BID) | ORAL | 3 refills | Status: DC

## 2020-04-20 MED ORDER — REPATHA SURECLICK 140 MG/ML ~~LOC~~ SOAJ: 1.0000 | SUBCUTANEOUS | 11 refills | Status: DC

## 2020-04-20 NOTE — Patient Instructions (Addendum)
Medication Instructions:  Dr. Debara Pickett recommends Repatha 140mg /mL (PCSK9). This is an injectable cholesterol medication self-administered once every 14 days. This medication will likely need prior approval with your insurance company, which we will work on. If the medication is not approved initially, we may need to do an appeal with your insurance.   Administer medication in area of fatty tissue such as abdomen, outer thigh, back of upper arm - and rotate site with each injection Store medication in refrigerator until ready to administer - allow to sit at room temp for 30 mins - 1 hour prior to injection Dispose of medication in a SHARPS container - your pharmacy should be able to direct you on this and proper disposal   If you need co-pay assistance grant, please look into the program at healthwellfoundation.org >> disease funds >> hypercholesterolemia. This is an online application or you can call to complete. Once approved, you will provide the "pharmacy card" information to your pharmacy and they will deduct the co-pays from this grant.    *If you need a refill on your cardiac medications before your next appointment, please call your pharmacy*   Lab Work: FASTING lipid panel in about 3-4 months (compelte about 1 week before your next visit with Dr. Debara Pickett)  If you have labs (blood work) drawn today and your tests are completely normal, you will receive your results only by: Marland Kitchen MyChart Message (if you have MyChart) OR . A paper copy in the mail If you have any lab test that is abnormal or we need to change your treatment, we will call you to review the results.   Testing/Procedures: NONE   Follow-Up: At [facility], you and your health needs are our priority.  As part of our continuing mission to provide you with exceptional heart care, we have created designated Provider Care Teams.  These Care Teams include your primary Cardiologist (physician) and Advanced Practice Providers (APPs  -  Physician Assistants and Nurse Practitioners) who all work together to provide you with the care you need, when you need it.  We recommend signing up for the patient portal called "MyChart".  Sign up information is provided on this After Visit Summary.  MyChart is used to connect with patients for Virtual Visits (Telemedicine).  Patients are able to view lab/test results, encounter notes, upcoming appointments, etc.  Non-urgent messages can be sent to your provider as well.   To learn more about what you can do with MyChart, go to [email or web address].    Your next appointment:   4 month(s)  The format for your next appointment:   In Person  Provider:   K. Mali Hilty, MD   Other Instructions

## 2020-04-20 NOTE — Progress Notes (Signed)
OFFICE NOTE  Chief Complaint:  Follow-up dyslipidemia  Primary Care Physician: Susy Frizzle, MD  HPI:  Lauren House is a pleasant 85 year old female I have been following for history of apical hypertrophic cardiomyopathy for which she has done well on Ranexa 500 mg daily. Recently, she was having right inguinal pain and had a hernia for which she had repair. That, unfortunately, she says has caused her more harm than good and she continues to have more pain in her right hip area which possibly is related to the hip. She saw Dr. Wynelle Link at [facility] for further evaluation from that and has received 2 hip injections which have been beneficial. He did mention that he felt she might need a hip replacement. Recently she had a recurrent episode of chest pain. This caused her to the hospital and improved with nitroglycerin. She was ruled out for MI and discharged. Unfortunately she returned several days later after she developed swelling, warmth and pain in her left forearm. It was felt that she might have possibly had an infected IV site and clearly had superficial thrombophlebitis. She was placed on antibiotics for this and is being treated with some improvement in her symptoms. After her emergency room visit, it was recommended that she increase her Ranexa 500 mg twice daily which is the recommended starting dose. She's had no further chest pain episodes. She had prior cardiac catheterization in 2011 which demonstrated a 50-60% LAD lesion. She underwent FFR for this which was negative with an FFR of 0.99. She had had a stress test at that time which indicated no evidence of reversible ischemia., but was having persistent chest pain.  It is been felt that her chest pain was due to small vessel disease in the setting of significant apical variant hypertrophic cardiomyopathy. This has been treated most effectively with ranolazine, and nitrates to some extent. Although her symptoms now  have escalated. This is concerning to me for possible worsening of her LAD disease.  Lauren House underwent a nuclear stress test on 02/03/2013. This was negative for ischemia with an EF of 52%. Since then she has reported some improvement in her symptoms. Her main complaints now have to do with left hip pain.  She is still contemplating surgery. She reported that her husband was recently diagnosed with esophageal cancer and it seems to be quite advanced. There did not appear to be any surgical options. This has caused her significant anxiety but she reports no chest pain.  Lauren House returns today for follow-up. Unfortunately she mentioned that her husband died about 3 months ago. He was diagnosed with esophageal cancer which was aggressive and inoperable. She is still grieving which is to be expected. She continues to have problems with her hip and at this point apparently will need hip surgery, but probably not hip replacement. That is scheduled about one month from now. She does have a history of moderate LAD stenosis in 2011 with a negative stress test one year ago. Recently, she has had worsening shortness of breath and chest pressure with exertion. She initially felt this was due to stress however her symptoms have been somewhat progressive and concern me for ischemia. Her EKG shows stable T-wave inversions consistent with her hypertrophic cardiomyopathy.  I saw Lauren House back in the office today. Overall she feels like she is doing well. She did have some mild chest discomfort last week and took a nitroglycerin. This is when she was in [place] with her son  and daughter-in-law and they were walking all over town. She managed to walk for several hours without any significant shortness of breath but did have some mild chest discomfort which was relieved fairly quickly with nitroglycerin. Overall she is doing well. She had her hip replacement on the left side which she is recovering from. She still has  discomfort when sitting in a car for long periods of time. She tells me that she's given a be going up to [place] fairly shortly on a vacation and that they will be going up to [facility] for the first time.'  Lauren House returns today for follow-up. She tells me on Saturday night she was awakened early in the morning with palpitations and a warm feeling as well as heaviness in her chest. She says it was quite intense. She denies any vivid dreams prior to that. She's not had any further episodes of chest pain since that or recurrent palpitations. She took a nitroglycerin and the symptoms resolved fairly promptly. She was able to go back to bed. Her EKGs is unchanged showing the T-wave inversions typical of apical variant hypertrophic cardiomyopathy. Her last stress test was in 2014 it was negative for ischemia. She does have a known 50-60% LAD stenosis which was negative by FFR.   07/12/2015  Lauren House was seen today in the office for follow-up. Recently she went to vacation with her family and they noticed more progressive shortness of breath and chest pressure. This was keeping her from doing certain activities. She says her symptoms of gotten worse since her last office visit in December. At that time I performed a nuclear stress test which showed a low normal EF 51% but no reversible ischemia. She does have known 50-60% LAD stenosis which was negative by FFR by catheterization in 2012.  08/26/2015  Lauren House returns today for follow-up. She underwent cardiac catheterization for progressive chest pain concerning for unstable angina on 07/22/2015 by Dr. Ellyn Hack, this demonstrated the following:  1. Mid LAD lesion, 80% stenosed. Post intervention with Promus DES 2.25 mm x 16 mm (2.4 mm), there is a 0% residual stenosis. 2. Ost LAD lesion, 50% stenosed. stable from original catheterization 3. The left ventricular systolic function is normal. with minimal apical filling consistent with known  apical hypertrophy. 4. Otherwise mild to moderate disease in the circumflex and RCA system  Since her coronary angiography and stent placement, she reports she is doing fairly well. She started back doing some exercise and push mowed her lawn the other day. Unfortunate she developed a cracked tooth and she's been having problems with pain. She took antibiotics including doxycycline which causes her stomach upset. She has a temporary crown and will be going back for some more dental work. On questioning today she reports that she has discontinued her Plavix. She said she's been off of it for about one week because of spontaneous bruising. She was not counseled to discontinue this and I reiterated the fact today that being off of dual antiplatelet therapy could put her at risk for acute in-stent thrombosis which could lead to massive heart attack.  11/18/2015  Lauren House was seen today in follow-up. She has been undergoing cardiac rehabilitation. She restart her Plavix which is very important given her recent stent in May of this past year. She denies any chest pain but still gets short of breath. She says is fairly stable but not necessarily getting worse. She is now gone through 8 sessions rehabilitation and is up  to 3.4 metabolic equivalents. Blood pressures been fairly well-controlled.  05/22/2016  Lauren House returns today for follow-up. When I last saw her she was undergoing cardiac rehabilitation and reported shortness of breath with exertion. This was shortly after her stenting I recommended continued cardiac rehabilitation. She says that after finishing rehabilitation she has not had any improvement in her shortness of breath and perhaps it may be worse. She notes it when walking up and down her stairs to her basement. She says after the second trip up the stairs she does get short of breath. She is under a lot of stress and there are some family issues which required her grandson to move in with  her. She denies any chest pain. Blood pressure was elevated today 161/85. She says it is always up in the office, however was 140/80 last time.  06/26/2016  Lauren House was seen today in follow-up. Her echocardiogram shows preserved systolic function, mild diastolic dysfunction and elevated LV filling pressure. We understand that she would have diastolic dysfunction secondary to apical hypertrophic cardiomyopathy. She continues to have shortness of breath with exertion. Her BNP is very low less than 100 and stable. I suspect her symptoms may be due to diastolic dysfunction elevated LVEDP. Blood pressure was over 696 systolic today. In addition, recent lipid profile indicated 2 cholesterol 279, triglycerides 216, HDL 48 and LDL-C1 88. Given her coronary artery disease her goal LDL-C is less than 70. She has been intolerant to numerous statins including rosuvastatin, atorvastatin and pravastatin. She is close to one year since her drug-eluting stent in the LAD.  01/01/2017  Lauren House returns today for follow-up.  General she seems to be doing pretty well.  In May we referred her for evaluation to start a PC SK 9 inhibitor.  She had a discussion about starting Repatha however was concerned about cost since she did not have drug coverage.  She is also concerned about not taking the medicine.  She says she ultimately wants to be with her deceased husband.  That being said, she is willing to entertain the medication after I told her today that the cost of come down significantly.  Her cholesterol was recently assessed and LDL remains over 200 untreated.  She has been intolerant to statins as previously outlined in my notes.  Also mentions she has had shingles and 2 bouts of pneumonia since I last saw her.  04/10/2017  Lauren House was seen today in follow-up.  Recently she was seen by primary care provider for bronchitis/pharyngitis.  She was treated with azithromycin.  She still has some scratchiness in her  throat but is improving.  She reports she does get short of breath with exertion, worse than it was a couple years ago but generally has been stable.  She denies any angina.  She is thought about the injectable PCS K9 inhibitors, but declines it at this time.  Unfortunately, her son recently died after being found to have cancer.  12/18/2017  Lauren House is seen today in follow-up.  Recently she has been doing some traveling with her son.  She went to [place] and another religious museum.  They were walking all day and she did get short of breath but denied any chest pain with this.  Overall she is done fairly well.  She is a little nervous about the fact that her Ranexa is now going to be generic, but I reassured her the medication would be very  similar.  Blood pressure was well controlled today.  Her EKG appears unchanged.  Unfortunately she had a recent mammogram which was abnormal and they are concerned about a chest wall cyst.  She is concerned that this may be cancer.  12/18/2019  Lauren House is seen today in follow-up.  Unfortunately she recently had COVID-19.  She was symptomatic for several days.  She had been previously vaccinated.  She underwent antibody infusion therapy and got better much quicker.  She is also been complaining of some leg pain.  She has bilateral varicose veins but has not used compression stockings.  EKG shows no significant new changes.  She denies any new chest pain.  She still has a persistently elevated cholesterol.  She has not been interested in additional therapies in the past and was intolerant to statins.  Her LDL was 210 in 2018 with a total cholesterol 300.  She does not eat a high cholesterol or saturated fat diet and therefore most likely has a familial hyperlipidemia.  04/20/2020  Ms. House is seen today in follow-up. Unfortunately she was hesitant to add extra medications. We did discuss the fact that she might qualify for health  will grant in which case she may get Repatha for free. Her cholesterol is significantly elevated and she is at high risk of future cardiovascular events. She did seem amenable to taking the medication if it was free after I explained it to her further.  PMHx:  Past Medical History:  Diagnosis Date  . Anxiety   . Apical variant hypertrophic cardiomyopathy ([place])   . Arthritis   . Borderline diabetes mellitus   . Bruises easily   . Bursitis of left hip   . Coronary atherosclerosis of native coronary artery    a. 2011: Cath showing nonobstructive CAD - 50-60% LAD (normal FFR) b. 06/2015: Cath showing 80% stenosis in the Mid LAD, tx w/ Promus DES 2.25 mm x 16 mm   . Essential hypertension, benign   . History of kidney stones   . History of skin cancer    Basal cell - abdomen  . Hyperlipidemia    Diet controlled.cannot take meds  . Nocturia   . Osteoporosis   . Overactive bladder   . Shortness of breath dyspnea   . Vertigo     Past Surgical History:  Procedure Laterality Date  . ABDOMINAL HYSTERECTOMY    . CARDIAC CATHETERIZATION N/A 07/22/2015   Procedure: Left Heart Cath and Coronary Angiography;  Surgeon: Leonie Man, MD;  Location: [facility];  Service: Cardiovascular;  Laterality: N/A;  . CARDIAC CATHETERIZATION N/A 07/22/2015   Procedure: Coronary Stent Intervention;  Surgeon: Leonie Man, MD;  Location: [facility];  Service: Cardiovascular;  Laterality: N/A;  . CORONARY STENT PLACEMENT  07/22/2015   mid lad  des   . EYE SURGERY     cataracts bilateral  . HEMORRHOID SURGERY    . INGUINAL HERNIA REPAIR  10/11/2011   Procedure: HERNIA REPAIR INGUINAL ADULT;  Surgeon: Stark Klein, MD;  Location: [facility];  Service: General;  Laterality: Right;  . OPEN SURGICAL REPAIR OF GLUTEAL TENDON Left 03/12/2014   Procedure: LEFT HIP BURSECTOMY AND GLUTEAL TENDON REPAIR;  Surgeon: [name], MD;  Location: [facility];  Service: Orthopedics;  Laterality: Left;     FAMHx:  Family History  Problem Relation Age of Onset  . Heart disease Father   . Lung cancer Sister   . Lung cancer Brother  SOCHx:   reports that she has never smoked. She has never used smokeless tobacco. She reports that she does not drink alcohol and does not use drugs.  ALLERGIES:  Allergies  Allergen Reactions  . Chlorhexidine Itching  . Vancomycin Hives  . Aspirin Other (See Comments)    Nose bleeds with full strength  . Atorvastatin Other (See Comments)    Muscle pain  . Bactrim [Sulfamethoxazole-Trimethoprim] Other (See Comments)    Doesn't remember   . Cephalosporins Hives and Itching  . Ciprofloxacin Nausea Only  . Clarithromycin Other (See Comments)    Doesn't remember   . Colestipol Hcl Other (See Comments)    Hallucinations  . Ezetimibe Other (See Comments)    GI upset  . Prednisone   . Simvastatin Other (See Comments)    Muscle spasms  . Penicillins Rash    Has patient had a PCN reaction causing immediate rash, facial/tongue/throat swelling, SOB or lightheadedness with hypotension: yes Has patient had a PCN reaction causing severe rash involving mucus membranes or skin necrosis: no Has patient had a PCN reaction that required hospitalization no Has patient had a PCN reaction occurring within the last 10 years: no If all of the above answers are "NO", then may proceed with Cephalosporin use.   . Tape Rash    ROS: Pertinent items noted in HPI and remainder of comprehensive ROS otherwise negative.  HOME MEDS: Current Outpatient Medications  Medication Sig Dispense Refill  . aspirin EC 81 MG tablet Take 81 mg by mouth daily.    . Cyanocobalamin (VITAMIN B12 PO) Take 1 tablet by mouth daily.    . diclofenac sodium (VOLTAREN) 1 % GEL Apply 2 g topically 4 (four) times daily. 100 g 1  . fluticasone (FLONASE) 50 MCG/ACT nasal spray USE TWO SPRAY IN EACH NOSTRIL EVERY DAY 50 g 1  . hydrocortisone cream 1 % Apply 1 application topically 2 (two)  times daily. (Patient taking differently: Apply 1 application topically at bedtime.) 30 g 0  . meclizine (ANTIVERT) 25 MG tablet Take 1 tablet (25 mg total) by mouth 3 (three) times daily as needed for dizziness. Do not take with zofran 30 tablet 0  . metoprolol tartrate (LOPRESSOR) 25 MG tablet Take 1 tablet (25 mg total) by mouth 2 (two) times daily. 180 tablet 1  . Multiple Vitamins-Minerals (OCUVITE PO) Take 1 tablet by mouth daily.    . nitroGLYCERIN (NITROSTAT) 0.4 MG SL tablet Place 1 tablet (0.4 mg total) under the tongue every 5 (five) minutes as needed for chest pain. 25 tablet 3  . ondansetron (ZOFRAN) 4 MG tablet Take 1 tablet (4 mg total) by mouth every 6 (six) hours. 12 tablet 0  . Polyethyl Glycol-Propyl Glycol (SYSTANE OP) Apply 1 drop to eye 3 (three) times daily as needed (Dry eyes).    . ranolazine (RANEXA) 500 MG 12 hr tablet Take 1 tablet (500 mg total) by mouth 2 (two) times daily. 180 tablet 3  . senna-docusate (SENOKOT-S) 8.6-50 MG tablet Take 1 tablet by mouth at bedtime as needed for mild constipation or moderate constipation. 20 tablet 0  . alendronate (FOSAMAX) 70 MG tablet Take 1 tablet (70 mg total) by mouth every 7 (seven) days. Take with a full glass of water on an empty stomach. 4 tablet 11   Current Facility-Administered Medications  Medication Dose Route Frequency Provider Last Rate Last Admin  . lidocaine (PF) (XYLOCAINE) 1 % injection 2 mL  2 mL Intradermal Once Redmond Baseman, [employer],  FNP        LABS/IMAGING: No results found for this or any previous visit (from the past 48 hour(s)). No results found.  VITALS: BP 134/76 (BP Location: Left Arm, Patient Position: Sitting)   Pulse (!) 59   Ht 5\' 7"  (1.702 m)   Wt 148 lb (67.1 kg)   SpO2 93%   BMI 23.18 kg/m   EXAM: General appearance: alert and no distress Neck: no carotid bruit, no JVD and thyroid not enlarged, symmetric, no tenderness/mass/nodules Lungs: clear to auscultation bilaterally Heart: regular  rate and rhythm, S1, S2 normal, no murmur, click, rub or gallop Abdomen: soft, non-tender; bowel sounds normal; no masses,  no organomegaly Extremities: extremities normal, atraumatic, no cyanosis or edema Pulses: 2+ and symmetric Skin: Skin color, texture, turgor normal. No rashes or lesions Neurologic: Grossly normal Psych: Pleasant  EKG: Sinus bradycardia at 59, possible left atrial enlargement, stable anterolateral T wave changes-personally reviewed  ASSESSMENT: 1. History of DOE - echo with normal systolic function, mild diastolic dysfunction and elevated LVEDP  2. Unstable angina - status post PCI with a Promus DES (2.25 mm x 16 mm to the mid LAD)- known 50-60% ostial LAD stenosis (FFR 0.99 in 2011) which was stable at repeat cath (06/2015) 3. Apical variant hypertrophic cardimyopathy - anterolateral TWI's - negative nuclear stress test 01-2015 4. Hypertension 5. Venous insufficiency 6. Right inguinal pain with a hernia, status post surgical repair 7. Right hip pain with degenerative hip disease - s/p surgery 8. Statin intolerance - myalgias 9. Probable Familial Hyperlipidemia ([facility])  PLAN: 1.   Lauren House had not considered taking Repatha. She forgot about it or perhaps cost was an issue. We discussed that further today and I believe she could qualify for health well grant. If so the cost may be nothing. She seemed interested at that point. I would recommend that we pursue Repatha and will provide information for applying for that grant. Plan repeat lipids in 3 to 4 months and follow-up at that time.  [name], MD, [facility], [place] Director of the [facility] Lipid Disorders &  Cardiovascular [facility] Diplomate of the American Board of Clinical Lipidology Attending Cardiologist  Direct Dial: [phone]  Fax: [phone]  Website:  [email or web address] 04/20/2020, 10:27 AM

## 2020-04-21 ENCOUNTER — Telehealth: Payer: Self-pay | Admitting: Internal Medicine

## 2020-04-21 NOTE — Telephone Encounter (Signed)
Regarding repatha [employer] prescription from 04/20/20: Per review of chart, patient does NOT have medicare part D drug coverage Spoke with [facility] and they bill her GoodRx card for prescriptions In speaking with our clinical pharmacy team, patient will need part D coverage to apply for grant from [place] She can apply for [employer] assistance for repatha BUT this would only be for 2022 if she gets approved She would then need to obtain part D drug coverage in 2023 and could potentially obtain healthwell grant at that time  Left message for patient to call back to discuss

## 2020-04-27 NOTE — Telephone Encounter (Signed)
Patient returning call.

## 2020-04-27 NOTE — Telephone Encounter (Signed)
Left message to call back  

## 2020-04-27 NOTE — Telephone Encounter (Signed)
Spoke with patient about Repatha. She said her daughter has already completed Dance movement psychotherapist Net application for her, she will sign and then bring by our office for MD to sign and fax. Advised her that she will need Part D drug coverage for 2023, as [employer] will NOT supply med for free for next year. She voiced understanding - will figure out plan based on how she does with this medication

## 2020-05-04 NOTE — Telephone Encounter (Signed)
Faxed Repatha assistance application to [employer] @ [phone]

## 2020-05-06 DIAGNOSIS — E782 Mixed hyperlipidemia: Secondary | ICD-10-CM | POA: Diagnosis not present

## 2020-05-07 LAB — LIPID PANEL
Chol/HDL Ratio: 6.6 ratio — ABNORMAL HIGH (ref 0.0–4.4)
Cholesterol, Total: 290 mg/dL — ABNORMAL HIGH (ref 100–199)
HDL: 44 mg/dL (ref >39)
LDL Chol Calc (NIH): 199 mg/dL — ABNORMAL HIGH (ref 0–99)
Triglycerides: 242 mg/dL — ABNORMAL HIGH (ref 0–149)
VLDL Cholesterol Cal: 47 mg/dL — ABNORMAL HIGH (ref 5–40)

## 2020-05-18 NOTE — Telephone Encounter (Signed)
Spoke with [employer] - patient's application is pending another review as she does not have part D drug coverage. Patient aware of this also

## 2020-05-27 DIAGNOSIS — M7062 Trochanteric bursitis, left hip: Secondary | ICD-10-CM | POA: Diagnosis not present

## 2020-07-27 ENCOUNTER — Ambulatory Visit: Payer: Medicare Other | Admitting: Internal Medicine

## 2020-08-15 ENCOUNTER — Encounter: Payer: Self-pay | Admitting: Family Medicine

## 2020-08-15 ENCOUNTER — Ambulatory Visit (INDEPENDENT_AMBULATORY_CARE_PROVIDER_SITE_OTHER): Payer: Medicare Other | Admitting: Family Medicine

## 2020-08-15 ENCOUNTER — Other Ambulatory Visit: Payer: Self-pay

## 2020-08-15 VITALS — BP 134/72 | HR 82 | Temp 98.1°F | Resp 16 | Ht 67.0 in | Wt 149.0 lb

## 2020-08-15 DIAGNOSIS — R270 Ataxia, unspecified: Secondary | ICD-10-CM

## 2020-08-15 DIAGNOSIS — R519 Headache, unspecified: Secondary | ICD-10-CM

## 2020-08-15 DIAGNOSIS — R42 Dizziness and giddiness: Secondary | ICD-10-CM | POA: Diagnosis not present

## 2020-08-15 DIAGNOSIS — D689 Coagulation defect, unspecified: Secondary | ICD-10-CM | POA: Insufficient documentation

## 2020-08-15 MED ORDER — BUTALBITAL-APAP-CAFFEINE 50-300-40 MG PO CAPS: 1.0000 | ORAL_CAPSULE | Freq: Four times a day (QID) | ORAL | 0 refills | Status: DC | PRN

## 2020-08-16 NOTE — Progress Notes (Signed)
Subjective:    Patient ID: Lauren House, female    DOB: 1935-05-26, 85 y.o.   MRN: [id number]  HPI In January 2021, the patient developed vertigo along with a severe headache prompting her to go to the emergency room.  At that time a CT scan was obtained of the head which showed some chronic microvascular ischemia and age-related atrophy but no acute changes.  Symptoms gradually improved.  1 week ago she again developed severe vertigo.  The vertigo passed after 24 hours however ever since that time she has had a pulsatile headache in her occiput.  She states that it comes and goes.  At times it can be severe and 8 or 9 on a scale of 10.  I asked the patient if the vertigo comes or goes and she states no.  She has not had the vertigo since 1 week ago.  However the headache has been coming and going on a daily basis ever since.  She denies any photophobia.  She denies any phonophobia.  She denies any blurry vision.  She denies any tenderness over the temporal artery.  She denies any eye pain.  She does report some pressure in between her eyes as well as some head congestion but she denies any pain in her sinuses or pressure in her face.  She denies any pain in her teeth.  Cranial nerves II through XII are grossly intact with muscle strength 5/5 equal and symmetric in the upper and lower extremities.  However the patient states that over the last 6 months she has had ataxia.  She states that she has to hold onto the wall while she walks or she loses her balance and she can fall.  Today in clinic, the patient walks without dragging her feet.  There is no weakness in her legs.  She does not have a foot drop.  However she has to hold onto the wall to keep from staggering from side to side.  She states that this is been present for the last 6 to 8 months.  To address this issue she has been trying to do exercise and physical therapy to improve her balance however she has to walk with a cane at home to prevent from  falling. Past Medical History:  Diagnosis Date   Anxiety    Apical variant hypertrophic cardiomyopathy ([place])    Arthritis    Borderline diabetes mellitus    Bruises easily    Bursitis of left hip    Coronary atherosclerosis of native coronary artery    a. 2011: Cath showing nonobstructive CAD - 50-60% LAD (normal FFR) b. 06/2015: Cath showing 80% stenosis in the Mid LAD, tx w/ Promus DES 2.25 mm x 16 mm    Essential hypertension, benign    History of kidney stones    History of skin cancer    Basal cell - abdomen   Hyperlipidemia    Diet controlled.cannot take meds   Nocturia    Osteoporosis    Overactive bladder    Shortness of breath dyspnea    Vertigo    Past Surgical History:  Procedure Laterality Date   ABDOMINAL HYSTERECTOMY     CARDIAC CATHETERIZATION N/A 07/22/2015   Procedure: Left Heart Cath and Coronary Angiography;  Surgeon: Leonie Man, MD;  Location: [facility];  Service: Cardiovascular;  Laterality: N/A;   CARDIAC CATHETERIZATION N/A 07/22/2015   Procedure: Coronary Stent Intervention;  Surgeon: Leonie Man, MD;  [facility]: [facility]  INVASIVE CV LAB;  Service: Cardiovascular;  Laterality: N/A;   CORONARY STENT PLACEMENT  07/22/2015   mid lad  des    EYE SURGERY     cataracts bilateral   HEMORRHOID SURGERY     INGUINAL HERNIA REPAIR  10/11/2011   Procedure: HERNIA REPAIR INGUINAL ADULT;  Surgeon: Stark Klein, MD;  Location: [facility];  Service: General;  Laterality: Right;   OPEN SURGICAL REPAIR OF GLUTEAL TENDON Left 03/12/2014   Procedure: LEFT HIP BURSECTOMY AND GLUTEAL TENDON REPAIR;  Surgeon: [name], MD;  Location: [facility];  Service: Orthopedics;  Laterality: Left;    Allergies  Allergen Reactions   Chlorhexidine Itching   Vancomycin Hives   Aspirin Other (See Comments)    Nose bleeds with full strength   Atorvastatin Other (See Comments)    Muscle pain   Bactrim [Sulfamethoxazole-Trimethoprim] Other (See Comments)    Doesn't remember     Cephalosporins Hives and Itching   Ciprofloxacin Nausea Only   Clarithromycin Other (See Comments)    Doesn't remember    Colestipol Hcl Other (See Comments)    Hallucinations   Ezetimibe Other (See Comments)    GI upset   Prednisone    Simvastatin Other (See Comments)    Muscle spasms   Penicillins Rash    Has patient had a PCN reaction causing immediate rash, facial/tongue/throat swelling, SOB or lightheadedness with hypotension: yes Has patient had a PCN reaction causing severe rash involving mucus membranes or skin necrosis: no Has patient had a PCN reaction that required hospitalization no Has patient had a PCN reaction occurring within the last 10 years: no If all of the above answers are "NO", then may proceed with Cephalosporin use.    Tape Rash   Social History   Socioeconomic History   Marital status: Widowed    Spouse name: Not on file   Number of children: Not on file   Years of education: Not on file   Highest education level: Not on file  Occupational History   Not on file  Tobacco Use   Smoking status: Never   Smokeless tobacco: Never  Substance and Sexual Activity   Alcohol use: No   Drug use: No   Sexual activity: Never  Other Topics Concern   Not on file  Social History Narrative   Not on file   Social Determinants of Health   Financial Resource Strain: Not on file  Food Insecurity: Not on file  Transportation Needs: Not on file  Physical Activity: Not on file  Stress: Not on file  Social Connections: Not on file  Intimate Partner Violence: Not on file   Family History  Problem Relation Age of Onset   Heart disease Father    Lung cancer Sister    Lung cancer Brother       Review of Systems     Objective:   Physical Exam Vitals reviewed.  Constitutional:      General: She is not in acute distress.    Appearance: Normal appearance. She is normal weight. She is not ill-appearing, toxic-appearing or diaphoretic.  HENT:     Head:  Normocephalic and atraumatic.     Right Ear: Tympanic membrane and ear canal normal.     Left Ear: Tympanic membrane and ear canal normal.     Nose: Nose normal.     Mouth/Throat:     Mouth: Mucous membranes are moist.     Pharynx: No oropharyngeal exudate or posterior oropharyngeal erythema.  Eyes:     Extraocular Movements: Extraocular movements intact.     Conjunctiva/sclera: Conjunctivae normal.     Pupils: Pupils are equal, round, and reactive to light.  Neck:     Vascular: No carotid bruit.  Cardiovascular:     Rate and Rhythm: Normal rate and regular rhythm.     Heart sounds: Normal heart sounds. No murmur heard.   No friction rub. No gallop.  Pulmonary:     Effort: Pulmonary effort is normal. No respiratory distress.     Breath sounds: No stridor. No wheezing or rhonchi.  Musculoskeletal:     Cervical back: Neck supple. No rigidity.  Lymphadenopathy:     Cervical: No cervical adenopathy.  Skin:    General: Skin is dry.  Neurological:     General: No focal deficit present.     Mental Status: She is alert and oriented to person, place, and time. Mental status is at baseline.     Cranial Nerves: No cranial nerve deficit.     Sensory: No sensory deficit.     Motor: No weakness.     Coordination: Coordination normal.     Gait: Gait abnormal.     Deep Tendon Reflexes: Reflexes normal.  Psychiatric:        Mood and Affect: Mood normal.        Behavior: Behavior normal.        Thought Content: Thought content normal.        Judgment: Judgment normal.          Assessment & Plan:  Occipital headache  Dizziness  Ataxia  This is the second episode that is occurred in a year.  I question if the patient may be having vestibular migraines explaining the vertigo followed by the pulsatile headache.  She has no family history of personal history of migraines.  I will give the patient Fioricet 1 tablet every 6 hours as needed for the headache.  Meanwhile given the new onset  headache, the dizziness, and ataxia, I will obtain an MRI of the brain to evaluate further.  Spent 30 minutes with the patient and examination and discussion

## 2020-08-31 ENCOUNTER — Other Ambulatory Visit (HOSPITAL_COMMUNITY): Payer: Self-pay | Admitting: Family Medicine

## 2020-08-31 DIAGNOSIS — Z1231 Encounter for screening mammogram for malignant neoplasm of breast: Secondary | ICD-10-CM

## 2020-09-05 ENCOUNTER — Other Ambulatory Visit: Payer: Self-pay

## 2020-09-05 ENCOUNTER — Ambulatory Visit
Admission: RE | Admit: 2020-09-05 | Discharge: 2020-09-05 | Disposition: A | Discharge status: Home / Self Care | Payer: Medicare Other | Source: Ambulatory Visit | Attending: Family Medicine | Admitting: Family Medicine

## 2020-09-05 DIAGNOSIS — R270 Ataxia, unspecified: Secondary | ICD-10-CM

## 2020-09-05 DIAGNOSIS — R42 Dizziness and giddiness: Secondary | ICD-10-CM

## 2020-09-05 DIAGNOSIS — R519 Headache, unspecified: Secondary | ICD-10-CM

## 2020-09-05 MED ORDER — GADOBENATE DIMEGLUMINE 529 MG/ML IV SOLN
13.0000 mL | Freq: Once | INTRAVENOUS | Status: AC | PRN
  Administered 2020-09-05: 13 mL via INTRAVENOUS

## 2020-09-09 ENCOUNTER — Telehealth: Payer: Self-pay | Admitting: *Deleted

## 2020-09-09 NOTE — Chronic Care Management (AMB) (Signed)
  Chronic Care Management   Outreach Note  09/09/2020 Name: Lauren House MRN: [id number] DOB: [birth year]  [name] is a 85 y.o. year old female who is a primary care patient of Dennard Schaumann, Cammie Mcgee, MD. I reached out to [name] by phone today in response to a referral sent by Ms. Sandi Mealy PCP, Dr. Dennard Schaumann.      An unsuccessful telephone outreach was attempted today. The patient was referred to the case management team for assistance with care management and care coordination.   Follow Up Plan: A HIPAA compliant phone message was left for the patient providing contact information and requesting a return call. The care management team will reach out to the patient again over the next 7 days.  If patient returns call to provider office, please advise to call [facility] at [phone].  [facility]

## 2020-09-09 NOTE — Chronic Care Management (AMB) (Signed)
  Chronic Care Management   Note  09/09/2020 Name: [name] MRN: [id number] DOB: November 21, 1935  Lauren House is a 85 y.o. year old female who is a primary care patient of Dennard Schaumann, Cammie Mcgee, MD. I reached out to [name] by phone today in response to a referral sent by Ms. Sandi Mealy PCP, Dr. Dennard Schaumann.      [name] was given information about Chronic Care Management services today including:  CCM service includes personalized support from designated clinical staff supervised by her physician, including individualized plan of care and coordination with other care providers 24/7 contact phone numbers for assistance for urgent and routine care needs. Service will only be billed when office clinical staff spend 20 minutes or more in a month to coordinate care. Only one practitioner may furnish and bill the service in a calendar month. The patient may stop CCM services at any time (effective at the end of the month) by phone call to the office staff. The patient will be responsible for cost sharing (co-pay) of up to 20% of the service fee (after annual deductible is met).  Patient agreed to services and verbal consent obtained.   Follow up plan: Telephone appointment with care management team member scheduled for: 10/03/20  [facility]

## 2020-09-15 ENCOUNTER — Telehealth: Payer: Self-pay | Admitting: Family Medicine

## 2020-09-15 DIAGNOSIS — Z1231 Encounter for screening mammogram for malignant neoplasm of breast: Secondary | ICD-10-CM

## 2020-09-15 DIAGNOSIS — N6320 Unspecified lump in the left breast, unspecified quadrant: Secondary | ICD-10-CM

## 2020-09-15 NOTE — Telephone Encounter (Signed)
Lauren Frizzle, MD  09/09/2020  6:48 AM EDT      No evidence of stroke or brain tumor or any explanation on the MRI to explain her balance issues.  Certainly recommend physical therapy.  If not improvingwith physical therapy would recommend neurology consultation   Please advise on mammogram.

## 2020-09-15 NOTE — Telephone Encounter (Signed)
Pt called in wanting to discuss her results from her MRI that pt had a few weeks ago. Also pt stated that she is due for her mammogram but has since found that her cyst has come back. The staff at [facility] has instructed pt to talk to her pcp to see if he wanted to order a diagnostic mammogram or just a routine one. Please advise  Cb#: [phone]

## 2020-09-16 ENCOUNTER — Telehealth: Payer: Self-pay | Admitting: Family Medicine

## 2020-09-16 NOTE — Telephone Encounter (Signed)
Patient returned missed call received today; patient unsure of who called her. No documentation noted in chart.

## 2020-09-16 NOTE — Telephone Encounter (Signed)
Call placed to patient. LMTRC.  

## 2020-09-20 NOTE — Telephone Encounter (Signed)
Call placed to patient and given information about MRI. Reports that she is currently in physical therapy. Reports if this does not help, she will call back for consult.   Also states that she has cyst like mass to L breast. Order for diagnostic imaging of L breast placed.

## 2020-09-22 ENCOUNTER — Other Ambulatory Visit: Payer: Self-pay | Admitting: Family Medicine

## 2020-09-22 DIAGNOSIS — N63 Unspecified lump in unspecified breast: Secondary | ICD-10-CM

## 2020-09-23 ENCOUNTER — Ambulatory Visit (HOSPITAL_COMMUNITY): Payer: Medicare Other

## 2020-09-23 ENCOUNTER — Telehealth (INDEPENDENT_AMBULATORY_CARE_PROVIDER_SITE_OTHER): Payer: Medicare Other | Admitting: Nurse Practitioner

## 2020-09-23 ENCOUNTER — Other Ambulatory Visit: Payer: Self-pay

## 2020-09-23 DIAGNOSIS — J069 Acute upper respiratory infection, unspecified: Secondary | ICD-10-CM | POA: Diagnosis not present

## 2020-09-23 NOTE — Progress Notes (Signed)
Subjective:    Patient ID: Lauren House, female    DOB: [birth year], 85 y.o.   MRN: [id number]  HPI: Lauren House is a 85 y.o. female presenting virtually for sore throat and body aches.  Chief Complaint  Patient presents with   Illness    Pt says she is not feeling at all, sx began Wednesday. Having sore throat, body aches,runny nose and  hoarseness. Taking tylenol and coricidin. Not sure if she has had fever. Took covid home test this morning, neg. Vaccinated, no booster    UPPER RESPIRATORY TRACT INFECTION Onset: Wednesday COVID-19 testing history: negative COVID-19 vaccination status: 2 shots no booster Fever: no Body aches: yes Chills: yes Cough:  yes; dry Shortness of breath: yes; not worse than normal Wheezing: no Chest pain: no Chest tightness: no Chest congestion: no Nasal congestion: no Runny nose: no Post nasal drip: yes Sneezing: no Sore throat: yes Swollen glands: no Sinus pressure: no Headache: yes Face pain: no Toothache: no Ear pain: no  Ear pressure: no  Eyes red/itching:no Eye drainage/crusting: no  Nausea: no  Vomiting: no Diarrhea: no  Change in appetite: yes ; decreased Loss of taste/smell: yes; chronic Rash: no Fatigue: yes Sick contacts: no Strep contacts: no  Context: stable Recurrent sinusitis: no Treatments attempted: Tylenol, coricidin,  Relief with OTC medications: yes  Allergies  Allergen Reactions   Chlorhexidine Itching   Vancomycin Hives   Aspirin Other (See Comments)    Nose bleeds with full strength   Atorvastatin Other (See Comments)    Muscle pain   Bactrim [Sulfamethoxazole-Trimethoprim] Other (See Comments)    Doesn't remember    Cephalosporins Hives and Itching   Ciprofloxacin Nausea Only   Clarithromycin Other (See Comments)    Doesn't remember    Colestipol Hcl Other (See Comments)    Hallucinations   Ezetimibe Other (See Comments)    GI upset   Prednisone    Simvastatin Other (See Comments)     Muscle spasms   Penicillins Rash    Has patient had a PCN reaction causing immediate rash, facial/tongue/throat swelling, SOB or lightheadedness with hypotension: yes Has patient had a PCN reaction causing severe rash involving mucus membranes or skin necrosis: no Has patient had a PCN reaction that required hospitalization no Has patient had a PCN reaction occurring within the last 10 years: no If all of the above answers are "NO", then may proceed with Cephalosporin use.    Tape Rash    Outpatient Encounter Medications as of 09/23/2020  Medication Sig Note   alendronate (FOSAMAX) 70 MG tablet Take 1 tablet (70 mg total) by mouth every 7 (seven) days. Take with a full glass of water on an empty stomach. 04/20/2020: Patient has not started yet   aspirin EC 81 MG tablet Take 81 mg by mouth daily.    Butalbital-APAP-Caffeine 50-300-40 MG CAPS Take 1 tablet by mouth every 6 (six) hours as needed (migraine).    Cyanocobalamin (VITAMIN B12 PO) Take 1 tablet by mouth daily.    fluticasone (FLONASE) 50 MCG/ACT nasal spray USE TWO SPRAY IN EACH NOSTRIL EVERY DAY    hydrocortisone cream 1 % Apply 1 application topically 2 (two) times daily. (Patient taking differently: Apply 1 application topically at bedtime.)    metoprolol tartrate (LOPRESSOR) 25 MG tablet Take 1 tablet (25 mg total) by mouth 2 (two) times daily.    Multiple Vitamins-Minerals (OCUVITE PO) Take 1 tablet by mouth daily.    nitroGLYCERIN (NITROSTAT)  0.4 MG SL tablet Place 1 tablet (0.4 mg total) under the tongue every 5 (five) minutes as needed for chest pain.    Polyethyl Glycol-Propyl Glycol (SYSTANE OP) Apply 1 drop to eye 3 (three) times daily as needed (Dry eyes).    ranolazine (RANEXA) 500 MG 12 hr tablet Take 1 tablet (500 mg total) by mouth 2 (two) times daily. 09/23/2020: Heart med   senna-docusate (SENOKOT-S) 8.6-50 MG tablet Take 1 tablet by mouth at bedtime as needed for mild constipation or moderate constipation.     [DISCONTINUED] diclofenac sodium (VOLTAREN) 1 % GEL Apply 2 g topically 4 (four) times daily.    [DISCONTINUED] Evolocumab (REPATHA SURECLICK) [id number] MG/ML SOAJ Inject 1 Dose into the skin every 14 (fourteen) days.    [DISCONTINUED] meclizine (ANTIVERT) 25 MG tablet Take 1 tablet (25 mg total) by mouth 3 (three) times daily as needed for dizziness. Do not take with zofran    [DISCONTINUED] ondansetron (ZOFRAN) 4 MG tablet Take 1 tablet (4 mg total) by mouth every 6 (six) hours.    Facility-Administered Encounter Medications as of 09/23/2020  Medication   lidocaine (PF) (XYLOCAINE) 1 % injection 2 mL    Patient Active Problem List   Diagnosis Date Noted   Blood clotting disorder ([place]) 08/15/2020   Coronary artery disease due to lipid rich plaque 01/01/2017   CAD S/P percutaneous coronary angioplasty 07/22/2015   DOE (dyspnea on exertion)    Unstable angina ([facility]) 07/12/2015   Heart palpitations 02/14/2015   Hip bursitis 03/13/2014   Trochanteric bursitis of left hip 03/12/2014   Exertional chest pain 02/10/2014   Preoperative cardiovascular examination 02/10/2014   Angina pectoris, variant ([place]) 12/24/2012   Left hip pain 12/16/2012   Bursitis of left hip 12/16/2012   Osteoarthritis, hip, bilateral 12/16/2012   Chest pain 08/15/2012   Apical variant hypertrophic cardiomyopathy ([place]) 08/15/2012   Bilateral inguinal hernia, right symptomatic 09/21/2011   [place] 10/18/2008   POSTMENOPAUSAL OSTEOPOROSIS 09/01/2008   HIP PAIN, LEFT 01/19/2008   CARCINOMA, BASAL CELL, SKIN 10/02/2006   VARICOSE VEIN 09/24/2006   ACTINIC KERATOSIS 09/24/2006   NECK PAIN 09/24/2006   BACK PAIN 09/24/2006   NEOP, BNG, SCALP/SKIN, NECK 04/16/2006   FIBROIDS, UTERUS 04/16/2006   Mixed hyperlipidemia 04/16/2006   MACULAR DEGENERATION 04/16/2006   Essential hypertension 04/16/2006   ALLERGIC RHINITIS 04/16/2006   OVERACTIVE BLADDER 04/16/2006   URINARY INCONTINENCE 04/16/2006   SKIN CANCER, HX OF 04/16/2006    VERTIGO, HX OF 04/16/2006   RENAL CALCULUS, HX OF 04/16/2006    Past Medical History:  Diagnosis Date   Anxiety    Apical variant hypertrophic cardiomyopathy ([place])    Arthritis    Borderline diabetes mellitus    Bruises easily    Bursitis of left hip    Coronary atherosclerosis of native coronary artery    a. 2011: Cath showing nonobstructive CAD - 50-60% LAD (normal FFR) b. 06/2015: Cath showing 80% stenosis in the Mid LAD, tx w/ Promus DES 2.25 mm x 16 mm    Essential hypertension, benign    History of kidney stones    History of skin cancer    Basal cell - abdomen   Hyperlipidemia    Diet controlled.cannot take meds   Nocturia    Osteoporosis    Overactive bladder    Shortness of breath dyspnea    Vertigo     Relevant past medical, surgical, family and social history reviewed and updated as indicated. Interim medical history since our  last visit reviewed.  Review of Systems Per HPI unless specifically indicated above     Objective:    There were no vitals taken for this visit.  Wt Readings from Last 3 Encounters:  08/15/20 149 lb (67.6 kg)  04/20/20 148 lb (67.1 kg)  12/28/19 148 lb (67.1 kg)    Physical Exam Physical examination unable to be performed due to lack of equipment.  Patient talking in complete sentences during telemedicine visit.  Results for orders placed or performed in visit on 04/20/20  Lipid panel  Result Value Ref Range   Cholesterol, Total 290 (H) 100 - 199 mg/dL   Triglycerides 242 (H) 0 - 149 mg/dL   HDL 44 >39 mg/dL   VLDL Cholesterol Cal 47 (H) 5 - 40 mg/dL   LDL Chol Calc (NIH) 199 (H) 0 - 99 mg/dL   Lipid Comment: Comment    Chol/HDL Ratio 6.6 (H) 0.0 - 4.4 ratio      Assessment & Plan:  1. Upper respiratory tract infection, unspecified type Acute, ongoing x 2 days.  Encouraged respiratory viral panel testing.  Reassured patient that symptoms are most consistent with a viral upper respiratory infection and explained lack of  efficacy of antibiotics against viruses.  Discussed expected course and features suggestive of secondary bacterial infection.  Continue supportive care. Increase fluid intake with water or electrolyte solution like pedialyte. Encouraged acetaminophen as needed for fever/pain. Encouraged salt water gargling, chloraseptic spray and throat lozenges. Encouraged OTC guaifenesin. Encouraged saline sinus flushes and/or neti with humidified air. Follow up if symptoms do not improve after 7 days.  If symptoms improve then worsen, return to clinic.  With any sudden onset new chest pain, dizziness, sweating, or shortness of breath, go to ED.  - SARS-CoV-2 RNA (COVID-19) and Respiratory Viral Panel, Qualitative NAAT     Follow up plan: Return if symptoms worsen or fail to improve.   This visit was completed via telephone due to the restrictions of the COVID-19 pandemic. All issues as above were discussed and addressed but no physical exam was performed. If it was felt that the patient should be evaluated in the office, they were directed there. The patient verbally consented to this visit. Patient was unable to complete an audio/visual visit due to Lack of equipment. Location of the patient: home Location of the provider: work Those involved with this call:  Provider: Noemi Chapel, DNP, FNP-C CMA: Annabelle Harman, CMA Front Desk/Registration: Santina Evans  Time spent on call:  8 minutes on the phone discussing health concerns. 11 minutes total spent in review of patient's record and preparation of their chart. I verified patient identity using two factors (patient name and date of birth). Patient consents verbally to being seen via telemedicine visit today.

## 2020-10-03 ENCOUNTER — Ambulatory Visit (INDEPENDENT_AMBULATORY_CARE_PROVIDER_SITE_OTHER): Payer: Medicare Other | Admitting: *Deleted

## 2020-10-03 DIAGNOSIS — E782 Mixed hyperlipidemia: Secondary | ICD-10-CM | POA: Diagnosis not present

## 2020-10-03 DIAGNOSIS — I10 Essential (primary) hypertension: Secondary | ICD-10-CM

## 2020-10-03 NOTE — Chronic Care Management (AMB) (Signed)
Chronic Care Management   CCM RN Visit Note  10/03/2020 Name: Lauren House MRN: [id number] DOB: 1935/04/28  Subjective: Lauren House is a 85 y.o. year old female who is a primary care patient of Pickard, Cammie Mcgee, MD. The care management team was consulted for assistance with disease management and care coordination needs.    Engaged with patient by telephone for initial visit in response to provider referral for case management and/or care coordination services.   Consent to Services:  The patient was given the following information about Chronic Care Management services today, agreed to services, and gave verbal consent: 1. CCM service includes personalized support from designated clinical staff supervised by the primary care provider, including individualized plan of care and coordination with other care providers 2. 24/7 contact phone numbers for assistance for urgent and routine care needs. 3. Service will only be billed when office clinical staff spend 20 minutes or more in a month to coordinate care. 4. Only one practitioner may furnish and bill the service in a calendar month. 5.The patient may stop CCM services at any time (effective at the end of the month) by phone call to the office staff. 6. The patient will be responsible for cost sharing (co-pay) of up to 20% of the service fee (after annual deductible is met). Patient agreed to services and consent obtained.  Patient agreed to services and verbal consent obtained.   Assessment: Review of patient past medical history, allergies, medications, health status, including review of consultants reports, laboratory and other test data, was performed as part of comprehensive evaluation and provision of chronic care management services.   SDOH (Social Determinants of Health) assessments and interventions performed:  SDOH Interventions    Flowsheet Row Most Recent Value  SDOH Interventions   Food Insecurity Interventions Intervention  Not Indicated  Social Connections Interventions Intervention Not Indicated  Transportation Interventions Intervention Not Indicated        CCM Care Plan  Allergies  Allergen Reactions   Chlorhexidine Itching   Vancomycin Hives   Aspirin Other (See Comments)    Nose bleeds with full strength   Atorvastatin Other (See Comments)    Muscle pain   Bactrim [Sulfamethoxazole-Trimethoprim] Other (See Comments)    Doesn't remember    Cephalosporins Hives and Itching   Ciprofloxacin Nausea Only   Clarithromycin Other (See Comments)    Doesn't remember    Colestipol Hcl Other (See Comments)    Hallucinations   Ezetimibe Other (See Comments)    GI upset   Prednisone    Simvastatin Other (See Comments)    Muscle spasms   Penicillins Rash    Has patient had a PCN reaction causing immediate rash, facial/tongue/throat swelling, SOB or lightheadedness with hypotension: yes Has patient had a PCN reaction causing severe rash involving mucus membranes or skin necrosis: no Has patient had a PCN reaction that required hospitalization no Has patient had a PCN reaction occurring within the last 10 years: no If all of the above answers are "NO", then may proceed with Cephalosporin use.    Tape Rash    Outpatient Encounter Medications as of 10/03/2020  Medication Sig Note   Cyanocobalamin (VITAMIN B12 PO) Take 1 tablet by mouth daily.    fluticasone (FLONASE) 50 MCG/ACT nasal spray USE TWO SPRAY IN EACH NOSTRIL EVERY DAY    hydrocortisone cream 1 % Apply 1 application topically 2 (two) times daily. (Patient taking differently: Apply 1 application topically at bedtime.)    metoprolol  tartrate (LOPRESSOR) 25 MG tablet Take 1 tablet (25 mg total) by mouth 2 (two) times daily.    Multiple Vitamins-Minerals (OCUVITE PO) Take 1 tablet by mouth daily.    nitroGLYCERIN (NITROSTAT) 0.4 MG SL tablet Place 1 tablet (0.4 mg total) under the tongue every 5 (five) minutes as needed for chest pain.    Polyethyl  Glycol-Propyl Glycol (SYSTANE OP) Apply 1 drop to eye 3 (three) times daily as needed (Dry eyes).    ranolazine (RANEXA) 500 MG 12 hr tablet Take 1 tablet (500 mg total) by mouth 2 (two) times daily. 09/23/2020: Heart med   senna-docusate (SENOKOT-S) 8.6-50 MG tablet Take 1 tablet by mouth at bedtime as needed for mild constipation or moderate constipation.    alendronate (FOSAMAX) 70 MG tablet Take 1 tablet (70 mg total) by mouth every 7 (seven) days. Take with a full glass of water on an empty stomach. (Patient not taking: Reported on 10/03/2020) 04/20/2020: Patient has not started yet   aspirin EC 81 MG tablet Take 81 mg by mouth daily.    Butalbital-APAP-Caffeine 50-300-40 MG CAPS Take 1 tablet by mouth every 6 (six) hours as needed (migraine).    Facility-Administered Encounter Medications as of 10/03/2020  Medication   lidocaine (PF) (XYLOCAINE) 1 % injection 2 mL    Patient Active Problem List   Diagnosis Date Noted   Blood clotting disorder ([place]) 08/15/2020   Coronary artery disease due to lipid rich plaque 01/01/2017   CAD S/P percutaneous coronary angioplasty 07/22/2015   DOE (dyspnea on exertion)    Unstable angina ([facility]) 07/12/2015   Heart palpitations 02/14/2015   Hip bursitis 03/13/2014   Trochanteric bursitis of left hip 03/12/2014   Exertional chest pain 02/10/2014   Preoperative cardiovascular examination 02/10/2014   Angina pectoris, variant ([place]) 12/24/2012   Left hip pain 12/16/2012   Bursitis of left hip 12/16/2012   Osteoarthritis, hip, bilateral 12/16/2012   Chest pain 08/15/2012   Apical variant hypertrophic cardiomyopathy ([place]) 08/15/2012   Bilateral inguinal hernia, right symptomatic 09/21/2011   [place]ED[place] 10/18/2008   POSTMENOPAUSAL OSTEOPOROSIS 09/01/2008   HIP PAIN, LEFT 01/19/2008   CARCINOMA, BASAL CELL, SKIN 10/02/2006   VARICOSE VEIN 09/24/2006   ACTINIC KERATOSIS 09/24/2006   NECK PAIN 09/24/2006   BACK PAIN 09/24/2006   NEOP, BNG, SCALP/SKIN, NECK  04/16/2006   FIBROIDS, UTERUS 04/16/2006   Mixed hyperlipidemia 04/16/2006   MACULAR DEGENERATION 04/16/2006   Essential hypertension 04/16/2006   ALLERGIC RHINITIS 04/16/2006   OVERACTIVE BLADDER 04/16/2006   URINARY INCONTINENCE 04/16/2006   SKIN CANCER, HX OF 04/16/2006   VERTIGO, HX OF 04/16/2006   RENAL CALCULUS, HX OF 04/16/2006    Conditions to be addressed/monitored:HTN and HLD  Care Plan : Hyperlipidemia with CAD  Updates made by Kassie Mends, RN since 10/03/2020 12:00 AM     Problem: Health Promotion or Disease Self-Management for hyperlipidemia/ CAD   Priority: Medium     Long-Range Goal: Self-Management Plan Developed for hyperlipidemia/ CAD   Start Date: 10/03/2020  Expected End Date: 04/05/2021  This Visit's Progress: On track  Priority: Medium  Note:   Current Barriers:  Poorly controlled hyperlipidemia, complicated by - unable to take statin drugs Current antihyperlipidemic regimen:  none-  pt trying to manage with diet and exericse Most recent lipid panel:     Component Value Date/Time   CHOL 290 (H) 05/06/2020 0833   TRIG 242 (H) 05/06/2020 0833   HDL 44 05/06/2020 0833   CHOLHDL 6.6 (H) 05/06/2020  4546   CHOLHDL 6.5 (H) 11/05/2016 0945   VLDL 43 (H) 11/03/2015 0825   LDLCALC 199 (H) 05/06/2020 0833   LDLCALC 210 (H) 11/05/2016 0945   ASCVD risk enhancing conditions: age >55,  HTN.  Patient reports she lives alone and has assistance from adult children if needed, pt continues to drive and is independent with ADL, IADL's.  Patient reports she exercises at her church 2 x per week, likes to cook and tries to eat fresh fruits and vegetables.  Has cane and walker but has not needed to use.  Does not adhere to provider recommendations re:  unable to take statin drugs due to side effects, patient trying to manage with diet and exercise. RN Care Manager Clinical Goal(s):  patient will work with Medical illustrator, providers, and care team towards execution of  optimized self-health management plan patient will verbalize understanding of plan for hyperlipidemia/ CAD patient will attend all scheduled medical appointments: follow up with primary care provider as needed the patient will demonstrate ongoing self health care management ability Interventions: Collaboration with Donita Brooks, MD regarding development and update of comprehensive plan of care as evidenced by provider attestation and co-signature Inter-disciplinary care team collaboration (see longitudinal plan of care) Medication review performed; medication list updated in electronic medical record.  Inter-disciplinary care team collaboration (see longitudinal plan of care) Referred to pharmacy team for assistance with HLD medication management Evaluation of current treatment plan related to hyperlipidemia CAD and patient's adherence to plan as established by provider. Provided education to patient re: diet, avoid saturated/ trans fats, choose fresh and frozen foods when you can Reviewed medications with patient and discussed importance of taking as prescribed Reviewed scheduled/upcoming provider appointments including: no scheduled appointments at present, follow up with primary care provider as needed. Discussed plans with patient for ongoing care management follow up and provided patient with direct contact information for care management team Reviewed importance of continuing to exercise and making healthy food choices Education on cholesterol sent via My Chart Patient Goals/Self-Care Activities: - call for medicine refill 2 or 3 days before it runs out - keep a list of all the medicines I take; vitamins and herbals too - drink 6 to 8 glasses of water each day - eat 3 to 5 servings of fruits and vegetables each day - eat 5 or 6 small meals each day - fill half the plate with nonstarchy vegetables - limit fast food meals to no more than 1 per week - manage portion size - if I have  chest pain, call for help - learn about small changes that will make a big difference - learn my personal risk factors- make shared treatment decisions with doctor - spend time outdoors at least 3 times a week  - continue to exercise at your church- keep up the good work - choose fresh or frozen foods if possible - bake or broil meats - look at information on cholesterol sent via My Chart - follow up with your primary doctor as needed Follow Up Plan: Telephone follow up appointment with care management team member scheduled for: 11/28/2020      Care Plan : Hypertension (Adult)  Updates made by Audrie Gallus, RN since 10/03/2020 12:00 AM     Problem: Hypertension (Hypertension)   Priority: Medium     Long-Range Goal: Hypertension Monitored   Start Date: 10/03/2020  Expected End Date: 04/05/2021  This Visit's Progress: On track  Priority: Medium  Note:  Objective:  Last practice recorded BP readings:  BP Readings from Last 3 Encounters:  08/15/20 134/72  04/20/20 134/76  12/28/19 140/70   Most recent eGFR/CrCl: No results found for: EGFR  No components found for: CRCL Current Barriers:  Knowledge Deficits related to basic understanding of hypertension pathophysiology and self care management- patient checks blood pressure on occasion.  Can benefit from hypertension management (low sodium diet, importance of checking blood pressure at least 3 x per week)  Patient lives alone and is managing well at home, has adult children she can call on if needed, pt reports she is "getting over a virus and a cough" (tested negative for covid), pt has been in touch with primary care provider. Case Manager Clinical Goal(s):  patient will verbalize understanding of plan for hypertension management patient will attend all scheduled medical appointments: no scheduled appointments at present patient will demonstrate improved adherence to prescribed treatment plan for hypertension as evidenced by taking  all medications as prescribed, monitoring and recording blood pressure as directed, adhering to low sodium/DASH diet Interventions:  Collaboration with Susy Frizzle, MD regarding development and update of comprehensive plan of care as evidenced by provider attestation and co-signature Inter-disciplinary care team collaboration (see longitudinal plan of care) Evaluation of current treatment plan related to hypertension self management and patient's adherence to plan as established by provider. Provided education to patient re: stroke prevention, s/s of heart attack and stroke, DASH diet, complications of uncontrolled blood pressure Reviewed medications with patient and discussed importance of compliance Discussed plans with patient for ongoing care management follow up and provided patient with direct contact information for care management team Advised patient, providing education and rationale, to monitor blood pressure daily and record, calling PCP for findings outside established parameters.  Reviewed scheduled/upcoming provider appointments including: no scheduled appointments at present. Reviewed importance of adhering to low sodium diet Education on low sodium diet sent via My Chart Encouraged patient to continue exercising 2 x weekly at her church Self-Care Activities: Attends all scheduled provider appointments Calls provider office for new concerns, questions, or BP outside discussed parameters Checks BP and records as discussed Follows a low sodium diet/DASH diet Patient Goals: - check blood pressure 3 times per week - choose a place to take my blood pressure (home, clinic or office, retail store) - write blood pressure results in a log or diary  - follow low salt diet - look over education low sodium diet sent via My Chart - continue to exercise Follow Up Plan: Telephone follow up appointment with care management team member scheduled for:  11/28/2020     Plan:Telephone  follow up appointment with care management team member scheduled for:  11/28/2020  Jacqlyn Larsen [facility], BSN RN Case Manager [facility] [phone]

## 2020-10-03 NOTE — Patient Instructions (Signed)
Visit Information   PATIENT GOALS:   Goals Addressed             This Visit's Progress    Maintain my quality of life related to hyperlipidemia/ CAD       Timeframe:  Long-Range Goal Priority:  Medium Start Date:    10/03/2020                         Expected End Date:        04/05/2021             Follow Up Date- 11/28/2020   - make shared treatment decisions with doctor - spend time outdoors at least 3 times a week  - continue to exercise at your church- keep up the good work - choose fresh or frozen foods if possible - bake or broil meats - look at information on cholesterol sent via My Chart - follow up with your primary doctor as needed   Why is this important?   Having a long-term illness can be scary.  It can also be stressful for you and your caregiver.  These steps may help.    Notes:      Track and Manage My Blood Pressure-Hypertension       Timeframe:  Long-Range Goal Priority:  Medium Start Date:       10/03/2020                      Expected End Date:     04/05/2021                  Follow Up Date- 11/28/2020   - check blood pressure 3 times per week - choose a place to take my blood pressure (home, clinic or office, retail store) - write blood pressure results in a log or diary  - follow low salt diet - look over education low sodium diet sent via My Chart - continue to exercise   Why is this important?   You won't feel high blood pressure, but it can still hurt your blood vessels.  High blood pressure can cause heart or kidney problems. It can also cause a stroke.  Making lifestyle changes like losing a little weight or eating less salt will help.  Checking your blood pressure at home and at different times of the day can help to control blood pressure.  If the doctor prescribes medicine remember to take it the way the doctor ordered.  Call the office if you cannot afford the medicine or if there are questions about it.     Notes:         Consent to  CCM Services: Ms. Heidel was given information about Chronic Care Management services including:  CCM service includes personalized support from designated clinical staff supervised by her physician, including individualized plan of care and coordination with other care providers 24/7 contact phone numbers for assistance for urgent and routine care needs. Service will only be billed when office clinical staff spend 20 minutes or more in a month to coordinate care. Only one practitioner may furnish and bill the service in a calendar month. The patient may stop CCM services at any time (effective at the end of the month) by phone call to the office staff. The patient will be responsible for cost sharing (co-pay) of up to 20% of the service fee (after annual deductible is met).  Patient agreed to services and  verbal consent obtained.   Patient verbalizes understanding of instructions provided today and agrees to view in [place].   Telephone follow up appointment with care management team member scheduled for:  10/3/2022Cholesterol Content in Foods Cholesterol is a waxy, fat-like substance that helps to carry fat in the blood. The body needs cholesterol in small amounts, but too much cholesterol can causedamage to the arteries and heart. Most people should eat less than 200 milligrams (mg) of cholesterol a day. Foods with cholesterol  Cholesterol is found in animal-based foods, such as meat, seafood, and dairy. Generally, low-fat dairy and lean meats have less cholesterol than full-fat dairy and fatty meats. The milligrams of cholesterol per serving (mg per serving) of common cholesterol-containing foods are listed below. Meat and other proteins Egg -- one large whole egg has 186 mg. Veal shank -- 4 oz has 141 mg. Lean ground [place] (93% lean) -- 4 oz has 118 mg. Fat-trimmed lamb loin -- 4 oz has 106 mg. Lean ground beef (90% lean) -- 4 oz has 100 mg. Lobster -- 3.5 oz has 90 mg. Pork loin  chops -- 4 oz has 86 mg. Canned salmon -- 3.5 oz has 83 mg. Fat-trimmed beef top loin -- 4 oz has 78 mg. Frankfurter -- 1 frank (3.5 oz) has 77 mg. Crab -- 3.5 oz has 71 mg. Roasted chicken without skin, white meat -- 4 oz has 66 mg. Light bologna -- 2 oz has 45 mg. Deli-cut [place] -- 2 oz has 31 mg. Canned tuna -- 3.5 oz has 31 mg. Berniece Salines -- 1 oz has 29 mg. Oysters and mussels (raw) -- 3.5 oz has 25 mg. Mackerel -- 1 oz has 22 mg. Trout -- 1 oz has 20 mg. Pork sausage -- 1 link (1 oz) has 17 mg. Salmon -- 1 oz has 16 mg. Tilapia -- 1 oz has 14 mg. Dairy Soft-serve ice cream --  cup (4 oz) has 103 mg. Whole-milk yogurt -- 1 cup (8 oz) has 29 mg. Cheddar cheese -- 1 oz has 28 mg. American cheese -- 1 oz has 28 mg. Whole milk -- 1 cup (8 oz) has 23 mg. 2% milk -- 1 cup (8 oz) has 18 mg. Cream cheese -- 1 tablespoon (Tbsp) has 15 mg. Cottage cheese --  cup (4 oz) has 14 mg. Low-fat (1%) milk -- 1 cup (8 oz) has 10 mg. Sour cream -- 1 Tbsp has 8.5 mg. Low-fat yogurt -- 1 cup (8 oz) has 8 mg. Nonfat Greek yogurt -- 1 cup (8 oz) has 7 mg. Half-and-half cream -- 1 Tbsp has 5 mg. Fats and oils Cod liver oil -- 1 tablespoon (Tbsp) has 82 mg. Butter -- 1 Tbsp has 15 mg. Lard -- 1 Tbsp has 14 mg. Bacon grease -- 1 Tbsp has 14 mg. Mayonnaise -- 1 Tbsp has 5-10 mg. Margarine -- 1 Tbsp has 3-10 mg. Exact amounts of cholesterol in these foods may vary depending on specificingredients and brands. Foods without cholesterol Most plant-based foods do not have cholesterol unless you combine them with a food that has cholesterol. Foods without cholesterol include: Grains and cereals. Vegetables. Fruits. Vegetable oils, such as olive, canola, and sunflower oil. Legumes, such as peas, beans, and lentils. Nuts and seeds. Egg whites. Summary The body needs cholesterol in small amounts, but too much cholesterol can cause damage to the arteries and heart. Most people should eat less than 200  milligrams (mg) of cholesterol a day. This information is not intended to  replace advice given to you by your health care provider. Make sure you discuss any questions you have with your healthcare provider. Document Revised: 05/26/2019 Document Reviewed: 07/06/2019 Elsevier Patient Education  [place]. Low-Sodium Eating Plan Sodium, which is an element that makes up salt, helps you maintain a healthy balance of fluids in your body. Too much sodium can increase your bloodpressure and cause fluid and waste to be held in your body. Your health care provider or dietitian may recommend following this plan if you have high blood pressure (hypertension), kidney disease, liver disease, or heart failure. Eating less sodium can help lower your blood pressure, reduce swelling, and protect your heart, liver, andkidneys. What are tips for following this plan? Reading food labels The Nutrition Facts label lists the amount of sodium in one serving of the food. If you eat more than one serving, you must multiply the listed amount of sodium by the number of servings. Choose foods with less than 140 mg of sodium per serving. Avoid foods with 300 mg of sodium or more per serving. Shopping  Look for lower-sodium products, often labeled as "low-sodium" or "no salt added." Always check the sodium content, even if foods are labeled as "unsalted" or "no salt added." Buy fresh foods. Avoid canned foods and pre-made or frozen meals. Avoid canned, cured, or processed meats. Buy breads that have less than 80 mg of sodium per slice.  Cooking  Eat more home-cooked food and less restaurant, buffet, and fast food. Avoid adding salt when cooking. Use salt-free seasonings or herbs instead of table salt or sea salt. Check with your health care provider or pharmacist before using salt substitutes. Cook with plant-based oils, such as canola, sunflower, or olive oil.  Meal planning When eating at a restaurant, ask  that your food be prepared with less salt or no salt, if possible. Avoid dishes labeled as brined, pickled, cured, smoked, or made with soy sauce, miso, or teriyaki sauce. Avoid foods that contain MSG (monosodium glutamate). MSG is sometimes added to [place] food, bouillon, and some canned foods. Make meals that can be grilled, baked, poached, roasted, or steamed. These are generally made with less sodium. General information Most people on this plan should limit their sodium intake to 1,500-2,000 mg (milligrams) of sodium each day. What foods should I eat? Fruits Fresh, frozen, or canned fruit. Fruit juice. Vegetables Fresh or frozen vegetables. "No salt added" canned vegetables. "No salt added"tomato sauce and paste. Low-sodium or reduced-sodium tomato and vegetable juice. Grains Low-sodium cereals, including oats, puffed wheat and rice, and shredded wheat. Low-sodium crackers. Unsalted rice. Unsalted pasta. Low-sodium bread.Whole-grain breads and whole-grain pasta. Meats and other proteins Fresh or frozen (no salt added) meat, poultry, seafood, and fish. Low-sodium canned tuna and salmon. Unsalted nuts. Dried peas, beans, and lentils withoutadded salt. Unsalted canned beans. Eggs. Unsalted nut butters. Dairy Milk. Soy milk. Cheese that is naturally low in sodium, such as ricotta cheese, fresh mozzarella, or Swiss cheese. Low-sodium or reduced-sodium cheese. Creamcheese. Yogurt. Seasonings and condiments Fresh and dried herbs and spices. Salt-free seasonings. Low-sodium mustard and ketchup. Sodium-free salad dressing. Sodium-free light mayonnaise. Fresh orrefrigerated horseradish. Lemon juice. Vinegar. Other foods Homemade, reduced-sodium, or low-sodium soups. Unsalted popcorn and pretzels.Low-salt or salt-free chips. The items listed above may not be a complete list of foods and beverages you can eat. Contact a dietitian for more information. What foods should I  avoid? Vegetables Sauerkraut, pickled vegetables, and relishes. Olives. [place] fries. Onion rings. Regular canned  vegetables (not low-sodium or reduced-sodium). Regular canned tomato sauce and paste (not low-sodium or reduced-sodium). Regular tomato and vegetable juice (not low-sodium or reduced-sodium). Frozenvegetables in sauces. Grains Instant hot cereals. Bread stuffing, pancake, and biscuit mixes. Croutons. Seasoned rice or pasta mixes. Noodle soup cups. Boxed or frozen macaroni andcheese. Regular salted crackers. Self-rising flour. Meats and other proteins Meat or fish that is salted, canned, smoked, spiced, or pickled. Precooked or cured meat, such as sausages or meat loaves. Berniece Salines. Ham. Pepperoni. Hot dogs. Corned beef. Chipped beef. Salt pork. Jerky. Pickled herring. Anchovies andsardines. Regular canned tuna. Salted nuts. Dairy Processed cheese and cheese spreads. Hard cheeses. Cheese curds. Blue cheese.Feta cheese. String cheese. Regular cottage cheese. Buttermilk. Canned milk. Fats and oils Salted butter. Regular margarine. Ghee. Bacon fat. Seasonings and condiments Onion salt, garlic salt, seasoned salt, table salt, and sea salt. Canned and packaged gravies. Worcestershire sauce. Tartar sauce. Barbecue sauce. Teriyaki sauce. Soy sauce, including reduced-sodium. Steak sauce. Fish sauce. Oyster sauce. Cocktail sauce. Horseradish that you find on the shelf. Regular ketchup and mustard. Meat flavorings and tenderizers. Bouillon cubes. Hot sauce. Pre-made or packaged marinades. Pre-made or packaged taco seasonings. Relishes.Regular salad dressings. Salsa. Other foods Salted popcorn and pretzels. Corn chips and puffs. Potato and tortilla chips.Canned or dried soups. Pizza. Frozen entrees and pot pies. The items listed above may not be a complete list of foods and beverages you should avoid. Contact a dietitian for more information. Summary Eating less sodium can help lower your blood  pressure, reduce swelling, and protect your heart, liver, and kidneys. Most people on this plan should limit their sodium intake to 1,500-2,000 mg (milligrams) of sodium each day. Canned, boxed, and frozen foods are high in sodium. Restaurant foods, fast foods, and pizza are also very high in sodium. You also get sodium by adding salt to food. Try to cook at home, eat more fresh fruits and vegetables, and eat less fast food and canned, processed, or prepared foods. This information is not intended to replace advice given to you by your health care provider. Make sure you discuss any questions you have with your healthcare provider. Document Revised: 03/20/2019 Document Reviewed: 01/14/2019 Elsevier Patient Education  2022 [facility], BSN RN Case Manager [facility] [phone]   CLINICAL CARE PLAN: Patient Care Plan: Hyperlipidemia with CAD     Problem Identified: Health Promotion or Disease Self-Management for hyperlipidemia/ CAD   Priority: Medium     Long-Range Goal: Self-Management Plan Developed for hyperlipidemia/ CAD   Start Date: 10/03/2020  Expected End Date: 04/05/2021  This Visit's Progress: On track  Priority: Medium  Note:   Current Barriers:  Poorly controlled hyperlipidemia, complicated by - unable to take statin drugs Current antihyperlipidemic regimen:  none-  pt trying to manage with diet and exericse Most recent lipid panel:     Component Value Date/Time   CHOL 290 (H) 05/06/2020 0833   TRIG 242 (H) 05/06/2020 0833   HDL 44 05/06/2020 0833   CHOLHDL 6.6 (H) 05/06/2020 0833   CHOLHDL 6.5 (H) 11/05/2016 0945   VLDL 43 (H) 11/03/2015 0825   LDLCALC 199 (H) 05/06/2020 0833   LDLCALC 210 (H) 11/05/2016 0945   ASCVD risk enhancing conditions: age >75,  HTN.  Patient reports she lives alone and has assistance from adult children if needed, pt continues to drive and is independent with ADL, IADL's.  Patient reports she  exercises at her church 2 x per week, likes  to cook and tries to eat fresh fruits and vegetables.  Has cane and walker but has not needed to use.  Does not adhere to provider recommendations re:  unable to take statin drugs due to side effects, patient trying to manage with diet and exercise. RN Care Manager Clinical Goal(s):  patient will work with Consulting civil engineer, providers, and care team towards execution of optimized self-health management plan patient will verbalize understanding of plan for hyperlipidemia/ CAD patient will attend all scheduled medical appointments: follow up with primary care provider as needed the patient will demonstrate ongoing self health care management ability Interventions: Collaboration with Susy Frizzle, MD regarding development and update of comprehensive plan of care as evidenced by provider attestation and co-signature Inter-disciplinary care team collaboration (see longitudinal plan of care) Medication review performed; medication list updated in electronic medical record.  Inter-disciplinary care team collaboration (see longitudinal plan of care) Referred to pharmacy team for assistance with HLD medication management Evaluation of current treatment plan related to hyperlipidemia CAD and patient's adherence to plan as established by provider. Provided education to patient re: diet, avoid saturated/ trans fats, choose fresh and frozen foods when you can Reviewed medications with patient and discussed importance of taking as prescribed Reviewed scheduled/upcoming provider appointments including: no scheduled appointments at present, follow up with primary care provider as needed. Discussed plans with patient for ongoing care management follow up and provided patient with direct contact information for care management team Reviewed importance of continuing to exercise and making healthy food choices Education on cholesterol sent via My Chart Patient  Goals/Self-Care Activities: - call for medicine refill 2 or 3 days before it runs out - keep a list of all the medicines I take; vitamins and herbals too - drink 6 to 8 glasses of water each day - eat 3 to 5 servings of fruits and vegetables each day - eat 5 or 6 small meals each day - fill half the plate with nonstarchy vegetables - limit fast food meals to no more than 1 per week - manage portion size - if I have chest pain, call for help - learn about small changes that will make a big difference - learn my personal risk factors- make shared treatment decisions with doctor - spend time outdoors at least 3 times a week  - continue to exercise at your church- keep up the good work - choose fresh or frozen foods if possible - bake or broil meats - look at information on cholesterol sent via My Chart - follow up with your primary doctor as needed Follow Up Plan: Telephone follow up appointment with care management team member scheduled for: 11/28/2020      Patient Care Plan: Hypertension (Adult)     Problem Identified: Hypertension (Hypertension)   Priority: Medium     Long-Range Goal: Hypertension Monitored   Start Date: 10/03/2020  Expected End Date: 04/05/2021  This Visit's Progress: On track  Priority: Medium  Note:   Objective:  Last practice recorded BP readings:  BP Readings from Last 3 Encounters:  08/15/20 134/72  04/20/20 134/76  12/28/19 140/70   Most recent eGFR/CrCl: No results found for: EGFR  No components found for: CRCL Current Barriers:  Knowledge Deficits related to basic understanding of hypertension pathophysiology and self care management- patient checks blood pressure on occasion.  Can benefit from hypertension management (low sodium diet, importance of checking blood pressure at least 3 x per week)  Patient lives alone and is managing  well at home, has adult children she can call on if needed, pt reports she is "getting over a virus and a cough" (tested  negative for covid), pt has been in touch with primary care provider. Case Manager Clinical Goal(s):  patient will verbalize understanding of plan for hypertension management patient will attend all scheduled medical appointments: no scheduled appointments at present patient will demonstrate improved adherence to prescribed treatment plan for hypertension as evidenced by taking all medications as prescribed, monitoring and recording blood pressure as directed, adhering to low sodium/DASH diet Interventions:  Collaboration with Susy Frizzle, MD regarding development and update of comprehensive plan of care as evidenced by provider attestation and co-signature Inter-disciplinary care team collaboration (see longitudinal plan of care) Evaluation of current treatment plan related to hypertension self management and patient's adherence to plan as established by provider. Provided education to patient re: stroke prevention, s/s of heart attack and stroke, DASH diet, complications of uncontrolled blood pressure Reviewed medications with patient and discussed importance of compliance Discussed plans with patient for ongoing care management follow up and provided patient with direct contact information for care management team Advised patient, providing education and rationale, to monitor blood pressure daily and record, calling PCP for findings outside established parameters.  Reviewed scheduled/upcoming provider appointments including: no scheduled appointments at present. Reviewed importance of adhering to low sodium diet Education on low sodium diet sent via My Chart Encouraged patient to continue exercising 2 x weekly at her church Self-Care Activities: Attends all scheduled provider appointments Calls provider office for new concerns, questions, or BP outside discussed parameters Checks BP and records as discussed Follows a low sodium diet/DASH diet Patient Goals: - check blood pressure 3  times per week - choose a place to take my blood pressure (home, clinic or office, retail store) - write blood pressure results in a log or diary  - follow low salt diet - look over education low sodium diet sent via My Chart - continue to exercise Follow Up Plan: Telephone follow up appointment with care management team member scheduled for:  11/28/2020

## 2020-10-05 ENCOUNTER — Other Ambulatory Visit: Payer: Medicare Other

## 2020-10-11 ENCOUNTER — Telehealth: Payer: Self-pay

## 2020-10-11 ENCOUNTER — Other Ambulatory Visit: Payer: Self-pay | Admitting: Family Medicine

## 2020-10-11 MED ORDER — MECLIZINE HCL 25 MG PO TABS: 25.0000 mg | ORAL_TABLET | Freq: Three times a day (TID) | ORAL | 0 refills | Status: DC | PRN

## 2020-10-11 NOTE — Telephone Encounter (Signed)
Pt called in stating that she is experiencing Vertigo so bad that pt can hardly stand. Pt is requesting a refill of meclizine (ANTIVERT) tablet 25 mg be sent to pharmacy. Pt also asks that when this med is sent to pharmacy that you please call daughter so that she can pick it up for pt. Please advise.  Cb#: Dorian [facility] [phone]

## 2020-10-11 NOTE — Telephone Encounter (Signed)
Call placed to patient and patient daughter made aware.  

## 2020-10-11 NOTE — Telephone Encounter (Signed)
Please advise 

## 2020-10-18 ENCOUNTER — Telehealth: Payer: Self-pay | Admitting: Family Medicine

## 2020-10-18 NOTE — Telephone Encounter (Signed)
Patient will schedule an appt to see provider for lump on back of left side of neck.

## 2020-10-21 ENCOUNTER — Ambulatory Visit (INDEPENDENT_AMBULATORY_CARE_PROVIDER_SITE_OTHER): Payer: Medicare Other | Admitting: Family Medicine

## 2020-10-21 ENCOUNTER — Other Ambulatory Visit: Payer: Self-pay

## 2020-10-21 VITALS — BP 152/78 | HR 70 | Temp 97.9°F | Resp 18 | Wt 146.0 lb

## 2020-10-21 DIAGNOSIS — R221 Localized swelling, mass and lump, neck: Secondary | ICD-10-CM | POA: Diagnosis not present

## 2020-10-21 NOTE — Progress Notes (Signed)
Subjective:    Patient ID: Lauren House, female    DOB: 1935-06-21, 85 y.o.   MRN: [id number]  HPI Patient is a very sweet 85 year old Caucasian female who presents today complaining of a lump on her neck.  She states that about 2 weeks ago, she noticed a lump in the muscle on the left side of the back of her neck.  Upon examination, there is a patch of hypopigmented white skin due to scarring on the left side of the back of her neck.  This is located approximately the level of C4 and C5.  Just below that in the trapezius muscle, there is a slight fullness to the muscle.  There are no sharp borders or edges to suggest a lymph node or a mass.  Is not firm.  Is not hard such as a malignancy.  However the muscle in that area is more prominent than the contralateral side.  The area of swelling is roughly 2 cm in diameter however it is a very vague poorly circumscribed area of swelling.  I believe it is a spasm in the muscles of her neck.  The other possibility would be a small lipoma.  Patient states that if she turns her head to the left it elicits a little bit of pain in that area.  There is no pain upon turning her head to the right.  She denies any fevers or chills.  There is no overlying erythema or warmth Past Medical History:  Diagnosis Date   Anxiety    Apical variant hypertrophic cardiomyopathy ([place])    Arthritis    Borderline diabetes mellitus    Bruises easily    Bursitis of left hip    Coronary atherosclerosis of native coronary artery    a. 2011: Cath showing nonobstructive CAD - 50-60% LAD (normal FFR) b. 06/2015: Cath showing 80% stenosis in the Mid LAD, tx w/ Promus DES 2.25 mm x 16 mm    Essential hypertension, benign    History of kidney stones    History of skin cancer    Basal cell - abdomen   Hyperlipidemia    Diet controlled.cannot take meds   Nocturia    Osteoporosis    Overactive bladder    Shortness of breath dyspnea    Vertigo    Past Surgical History:   Procedure Laterality Date   ABDOMINAL HYSTERECTOMY     CARDIAC CATHETERIZATION N/A 07/22/2015   Procedure: Left Heart Cath and Coronary Angiography;  Surgeon: Leonie Man, MD;  Location: [facility];  Service: Cardiovascular;  Laterality: N/A;   CARDIAC CATHETERIZATION N/A 07/22/2015   Procedure: Coronary Stent Intervention;  Surgeon: Leonie Man, MD;  Location: [facility];  Service: Cardiovascular;  Laterality: N/A;   CORONARY STENT PLACEMENT  07/22/2015   mid lad  des    EYE SURGERY     cataracts bilateral   HEMORRHOID SURGERY     INGUINAL HERNIA REPAIR  10/11/2011   Procedure: HERNIA REPAIR INGUINAL ADULT;  Surgeon: Stark Klein, MD;  Location: [facility];  Service: General;  Laterality: Right;   OPEN SURGICAL REPAIR OF GLUTEAL TENDON Left 03/12/2014   Procedure: LEFT HIP BURSECTOMY AND GLUTEAL TENDON REPAIR;  Surgeon: [name], MD;  Location: [facility];  Service: Orthopedics;  Laterality: Left;    Allergies  Allergen Reactions   Chlorhexidine Itching   Vancomycin Hives   Aspirin Other (See Comments)    Nose bleeds with full strength  Atorvastatin Other (See Comments)    Muscle pain   Bactrim [Sulfamethoxazole-Trimethoprim] Other (See Comments)    Doesn't remember    Cephalosporins Hives and Itching   Ciprofloxacin Nausea Only   Clarithromycin Other (See Comments)    Doesn't remember    Colestipol Hcl Other (See Comments)    Hallucinations   Ezetimibe Other (See Comments)    GI upset   Prednisone    Simvastatin Other (See Comments)    Muscle spasms   Penicillins Rash    Has patient had a PCN reaction causing immediate rash, facial/tongue/throat swelling, SOB or lightheadedness with hypotension: yes Has patient had a PCN reaction causing severe rash involving mucus membranes or skin necrosis: no Has patient had a PCN reaction that required hospitalization no Has patient had a PCN reaction occurring within the last 10 years: no If all of the above  answers are "NO", then may proceed with Cephalosporin use.    Tape Rash   Social History   Socioeconomic History   Marital status: Widowed    Spouse name: Not on file   Number of children: Not on file   Years of education: Not on file   Highest education level: Not on file  Occupational History   Not on file  Tobacco Use   Smoking status: Never   Smokeless tobacco: Never  Substance and Sexual Activity   Alcohol use: No   Drug use: No   Sexual activity: Never  Other Topics Concern   Not on file  Social History Narrative   Not on file   Social Determinants of Health   Financial Resource Strain: Not on file  Food Insecurity: No Food Insecurity   Worried About Running Out of Food in the Last Year: Never true   [place] in the Last Year: Never true  Transportation Needs: No Transportation Needs   Lack of Transportation (Medical): No   Lack of Transportation (Non-Medical): No  Physical Activity: Not on file  Stress: Not on file  Social Connections: Moderately Integrated   Frequency of Communication with Friends and Family: More than three times a week   Frequency of Social Gatherings with Friends and Family: More than three times a week   Attends Religious Services: More than 4 times per year   Active Member of [employer] or Organizations: Yes   Attends Archivist Meetings: More than 4 times per year   Marital Status: Widowed  Human resources officer Violence: Not on file   Family History  Problem Relation Age of Onset   Heart disease Father    Lung cancer Sister    Lung cancer Brother       Review of Systems     Objective:   Physical Exam Vitals reviewed.  Constitutional:      General: She is not in acute distress.    Appearance: Normal appearance. She is normal weight. She is not ill-appearing, toxic-appearing or diaphoretic.  HENT:     Head: Normocephalic and atraumatic.  Neck:     Vascular: No carotid bruit.   Cardiovascular:     Rate and  Rhythm: Normal rate and regular rhythm.     Heart sounds: Normal heart sounds. No murmur heard.   No friction rub. No gallop.  Pulmonary:     Effort: Pulmonary effort is normal. No respiratory distress.     Breath sounds: No stridor. No wheezing or rhonchi.  Musculoskeletal:     Cervical back: Neck supple. No rigidity or tenderness.  Lymphadenopathy:     Cervical: No cervical adenopathy.  Skin:    General: Skin is dry.  Neurological:     General: No focal deficit present.     Mental Status: She is alert and oriented to person, place, and time. Mental status is at baseline.     Cranial Nerves: No cranial nerve deficit.          Assessment & Plan:  Localized swelling, mass and lump, neck It is not firm or painful.  I do not suspect malignancy.  There are not sharp borders to suggest a lymph node.  I believe this could either be a spasm in the trapezius muscle or a small lipoma.  I recommended that we simply monitor the area clinically.  If growing or changing, we can always obtain an ultrasound of that area.  Patient will notify me in 2 to 3 weeks if still persistent.  I will be glad to order the ultrasound if still persistent.  If gradually subsiding or going away, no further work-up is necessary.  Recommended she apply heat to the area to treat possible muscle spasm.

## 2020-11-14 DIAGNOSIS — X32XXXD Exposure to sunlight, subsequent encounter: Secondary | ICD-10-CM | POA: Diagnosis not present

## 2020-11-14 DIAGNOSIS — L570 Actinic keratosis: Secondary | ICD-10-CM | POA: Diagnosis not present

## 2020-11-28 ENCOUNTER — Ambulatory Visit (INDEPENDENT_AMBULATORY_CARE_PROVIDER_SITE_OTHER): Payer: Medicare Other | Admitting: *Deleted

## 2020-11-28 DIAGNOSIS — E782 Mixed hyperlipidemia: Secondary | ICD-10-CM

## 2020-11-28 DIAGNOSIS — I10 Essential (primary) hypertension: Secondary | ICD-10-CM

## 2020-11-28 NOTE — Patient Instructions (Signed)
Visit Information  PATIENT GOALS:  Goals Addressed             This Visit's Progress    Maintain my quality of life related to hyperlipidemia/ CAD       Timeframe:  Long-Range Goal Priority:  Medium Start Date:    10/03/2020                         Expected End Date:        04/05/2021             Follow Up Date- 02/06/2021   - talk with your doctor about incontinence issues - look over education sent via My Chart- Incontinence - limit caffeine in your diet - spend time outdoors at least 3 times a week  - continue to exercise 2 x week at your church- keep up the good work - choose fresh or frozen foods if possible - bake or broil meats   Why is this important?   Having a long-term illness can be scary.  It can also be stressful for you and your caregiver.  These steps may help.    Notes:      Track and Manage My Blood Pressure-Hypertension       Timeframe:  Long-Range Goal Priority:  Medium Start Date:       10/03/2020                      Expected End Date:     04/05/2021                  Follow Up Date- 02/06/2021   - check blood pressure 3 times per week and record in a log - follow low salt diet - continue to exercise at your church - follow safety precautions, do not ambulate if you have vertigo - take all medications as prescribed   Why is this important?   You won't feel high blood pressure, but it can still hurt your blood vessels.  High blood pressure can cause heart or kidney problems. It can also cause a stroke.  Making lifestyle changes like losing a little weight or eating less salt will help.  Checking your blood pressure at home and at different times of the day can help to control blood pressure.  If the doctor prescribes medicine remember to take it the way the doctor ordered.  Call the office if you cannot afford the medicine or if there are questions about it.     Notes:         Patient verbalizes understanding of instructions provided today and  agrees to view in [place].   Telephone follow up appointment with care management team member scheduled for:   02/06/2021  Urinary Incontinence Urinary incontinence refers to a condition in which a person is unable to control where and when to pass urine. A person with this condition will urinate when he or she does not mean to (involuntarily). What are the causes? This condition may be caused by: Medicines. Infections. Constipation. Overactive bladder muscles. Weak bladder muscles. Weak pelvic floor muscles. These muscles provide support for the bladder, intestine, and, in women, the uterus. Enlarged prostate in men. The prostate is a gland near the bladder. When it gets too big, it can pinch the urethra. With the urethra blocked, the bladder can weaken and lose the ability to empty properly. Surgery. Emotional factors, such as anxiety, stress, or post-traumatic  stress disorder (PTSD). Pelvic organ prolapse. This happens in women when organs shift out of place and into the vagina. This shift can prevent the bladder and urethra from working properly. What increases the risk? The following factors may make you more likely to develop this condition: Older age. Obesity and physical inactivity. Pregnancy and childbirth. Menopause. Diseases that affect the nerves or spinal cord (neurological diseases). Long-term (chronic) coughing. This can increase pressure on the bladder and pelvic floor muscles. What are the signs or symptoms? Symptoms may vary depending on the type of urinary incontinence you have. They include: A sudden urge to urinate, but passing urine involuntarily before you can get to a bathroom (urge incontinence). Suddenly passing urine with any activity that forces urine to pass, such as coughing, laughing, exercise, or sneezing (stress incontinence). Needing to urinate often, but urinating only a small amount, or constantly dribbling urine (overflow incontinence). Urinating  because you cannot get to the bathroom in time due to a physical disability, such as arthritis or injury, or communication and thinking problems, such as Alzheimer disease (functional incontinence). How is this diagnosed? This condition may be diagnosed based on: Your medical history. A physical exam. Tests, such as: Urine tests. X-rays of your kidney and bladder. Ultrasound. CT scan. Cystoscopy. In this procedure, a health care provider inserts a tube with a light and camera (cystoscope) through the urethra and into the bladder in order to check for problems. Urodynamic testing. These tests assess how well the bladder, urethra, and sphincter can store and release urine. There are different types of urodynamic tests, and they vary depending on what the test is measuring. To help diagnose your condition, your health care provider may recommend that you keep a log of when you urinate and how much you urinate. How is this treated? Treatment for this condition depends on the type of incontinence that you have and its cause. Treatment may include: Lifestyle changes, such as: Quitting smoking. Maintaining a healthy weight. Staying active. Try to get 150 minutes of moderate-intensity exercise every week. Ask your health care provider which activities are safe for you. Eating a healthy diet. Avoid high-fat foods, like fried foods. Avoid refined carbohydrates like white bread and white rice. Limit how much alcohol and caffeine you drink. Increase your fiber intake. Foods such as fresh fruits, vegetables, beans, and whole grains are healthy sources of fiber. Pelvic floor muscle exercises. Bladder training, such as lengthening the amount of time between bathroom breaks, or using the bathroom at regular intervals. Using techniques to suppress bladder urges. This can include distraction techniques or controlled breathing exercises. Medicines to relax the bladder muscles and prevent bladder  spasms. Medicines to help slow or prevent the growth of a man's prostate. Botox injections. These can help relax the bladder muscles. Using pulses of electricity to help change bladder reflexes (electrical nerve stimulation). For women, using a medical device to prevent urine leaks. This is a small, tampon-like, disposable device that is inserted into the urethra. Injecting collagen or carbon beads (bulking agents) into the urinary sphincter. These can help thicken tissue and close the bladder opening. Surgery. Follow these instructions at home: Lifestyle Limit alcohol and caffeine. These can fill your bladder quickly and irritate it. Keep yourself clean to help prevent odors and skin damage. Ask your doctor about special skin creams and cleansers that can protect the skin from urine. Consider wearing pads or adult diapers. Make sure to change them regularly, and always change them right after experiencing  incontinence. General instructions Take over-the-counter and prescription medicines only as told by your health care provider. Use the bathroom about every 3-4 hours, even if you do not feel the need to urinate. Try to empty your bladder completely every time. After urinating, wait a minute. Then try to urinate again. Make sure you are in a relaxed position while urinating. If your incontinence is caused by nerve problems, keep a log of the medicines you take and the times you go to the bathroom. Keep all follow-up visits as told by your health care provider. This is important. Contact a health care provider if: You have pain that gets worse. Your incontinence gets worse. Get help right away if: You have a fever or chills. You are unable to urinate. You have redness in your groin area or down your legs. Summary Urinary incontinence refers to a condition in which a person is unable to control where and when to pass urine. This condition may be caused by medicines, infection, weak bladder  muscles, weak pelvic floor muscles, enlargement of the prostate (in men), or surgery. The following factors increase your risk for developing this condition: older age, obesity, pregnancy and childbirth, menopause, neurological diseases, and chronic coughing. There are several types of urinary incontinence. They include urge incontinence, stress incontinence, overflow incontinence, and functional incontinence. This condition is usually treated first with lifestyle and behavioral changes, such as quitting smoking, eating a healthier diet, and doing regular pelvic floor exercises. Other treatment options include medicines, bulking agents, medical devices, electrical nerve stimulation, or surgery. This information is not intended to replace advice given to you by your health care provider. Make sure you discuss any questions you have with your health care provider. Document Revised: 02/22/2017 Document Reviewed: 05/24/2016 Elsevier Patient Education  2021 [place]   Jacqlyn Larsen [facility], BSN RN Case Manager [facility] [phone]

## 2020-11-28 NOTE — Chronic Care Management (AMB) (Signed)
Chronic Care Management   CCM RN Visit Note  11/28/2020 Name: Lauren House MRN: [id number] DOB: Feb 27, 1935  Subjective: Lauren House is a 85 y.o. year old female who is a primary care patient of Pickard, Priscille Heidelberg, MD. The care management team was consulted for assistance with disease management and care coordination needs.    Engaged with patient by telephone for follow up visit in response to provider referral for case management and/or care coordination services.   Consent to Services:  The patient was given the following information about Chronic Care Management services today, agreed to services, and gave verbal consent: 1. CCM service includes personalized support from designated clinical staff supervised by the primary care provider, including individualized plan of care and coordination with other care providers 2. 24/7 contact phone numbers for assistance for urgent and routine care needs. 3. Service will only be billed when office clinical staff spend 20 minutes or more in a month to coordinate care. 4. Only one practitioner may furnish and bill the service in a calendar month. 5.The patient may stop CCM services at any time (effective at the end of the month) by phone call to the office staff. 6. The patient will be responsible for cost sharing (co-pay) of up to 20% of the service fee (after annual deductible is met). Patient agreed to services and consent obtained.  Patient agreed to services and verbal consent obtained.   Assessment: Review of patient past medical history, allergies, medications, health status, including review of consultants reports, laboratory and other test data, was performed as part of comprehensive evaluation and provision of chronic care management services.   SDOH (Social Determinants of Health) assessments and interventions performed:    CCM Care Plan  Allergies  Allergen Reactions   Chlorhexidine Itching   Vancomycin Hives   Aspirin Other (See  Comments)    Nose bleeds with full strength   Atorvastatin Other (See Comments)    Muscle pain   Bactrim [Sulfamethoxazole-Trimethoprim] Other (See Comments)    Doesn't remember    Cephalosporins Hives and Itching   Ciprofloxacin Nausea Only   Clarithromycin Other (See Comments)    Doesn't remember    Colestipol Hcl Other (See Comments)    Hallucinations   Ezetimibe Other (See Comments)    GI upset   Prednisone    Simvastatin Other (See Comments)    Muscle spasms   Penicillins Rash    Has patient had a PCN reaction causing immediate rash, facial/tongue/throat swelling, SOB or lightheadedness with hypotension: yes Has patient had a PCN reaction causing severe rash involving mucus membranes or skin necrosis: no Has patient had a PCN reaction that required hospitalization no Has patient had a PCN reaction occurring within the last 10 years: no If all of the above answers are "NO", then may proceed with Cephalosporin use.    Tape Rash    Outpatient Encounter Medications as of 11/28/2020  Medication Sig Note   alendronate (FOSAMAX) 70 MG tablet Take 1 tablet (70 mg total) by mouth every 7 (seven) days. Take with a full glass of water on an empty stomach. (Patient not taking: No sig reported) 04/20/2020: Patient has not started yet   aspirin EC 81 MG tablet Take 81 mg by mouth daily.    Butalbital-APAP-Caffeine 50-300-40 MG CAPS Take 1 tablet by mouth every 6 (six) hours as needed (migraine). (Patient not taking: Reported on 10/21/2020)    Cyanocobalamin (VITAMIN B12 PO) Take 1 tablet by mouth daily.  fluticasone (FLONASE) 50 MCG/ACT nasal spray USE TWO SPRAY IN EACH NOSTRIL EVERY DAY    hydrocortisone cream 1 % Apply 1 application topically 2 (two) times daily. (Patient not taking: Reported on 10/21/2020)    metoprolol tartrate (LOPRESSOR) 25 MG tablet Take 1 tablet (25 mg total) by mouth 2 (two) times daily.    Multiple Vitamins-Minerals (OCUVITE PO) Take 1 tablet by mouth daily.     nitroGLYCERIN (NITROSTAT) 0.4 MG SL tablet Place 1 tablet (0.4 mg total) under the tongue every 5 (five) minutes as needed for chest pain.    Polyethyl Glycol-Propyl Glycol (SYSTANE OP) Apply 1 drop to eye 3 (three) times daily as needed (Dry eyes).    ranolazine (RANEXA) 500 MG 12 hr tablet Take 1 tablet (500 mg total) by mouth 2 (two) times daily. 09/23/2020: Heart med   senna-docusate (SENOKOT-S) 8.6-50 MG tablet Take 1 tablet by mouth at bedtime as needed for mild constipation or moderate constipation. (Patient not taking: Reported on 10/21/2020)    No facility-administered encounter medications on file as of 11/28/2020.    Patient Active Problem List   Diagnosis Date Noted   Blood clotting disorder ([place]) 08/15/2020   Coronary artery disease due to lipid rich plaque 01/01/2017   CAD S/P percutaneous coronary angioplasty 07/22/2015   DOE (dyspnea on exertion)    Unstable angina ([facility]) 07/12/2015   Heart palpitations 02/14/2015   Hip bursitis 03/13/2014   Trochanteric bursitis of left hip 03/12/2014   Exertional chest pain 02/10/2014   Preoperative cardiovascular examination 02/10/2014   Angina pectoris, variant ([place]) 12/24/2012   Left hip pain 12/16/2012   Bursitis of left hip 12/16/2012   Osteoarthritis, hip, bilateral 12/16/2012   Chest pain 08/15/2012   Apical variant hypertrophic cardiomyopathy ([place]) 08/15/2012   Bilateral inguinal hernia, right symptomatic 09/21/2011   [place] 10/18/2008   POSTMENOPAUSAL OSTEOPOROSIS 09/01/2008   HIP PAIN, LEFT 01/19/2008   CARCINOMA, BASAL CELL, SKIN 10/02/2006   VARICOSE VEIN 09/24/2006   ACTINIC KERATOSIS 09/24/2006   NECK PAIN 09/24/2006   BACK PAIN 09/24/2006   NEOP, BNG, SCALP/SKIN, NECK 04/16/2006   FIBROIDS, UTERUS 04/16/2006   Mixed hyperlipidemia 04/16/2006   MACULAR DEGENERATION 04/16/2006   Essential hypertension 04/16/2006   ALLERGIC RHINITIS 04/16/2006   OVERACTIVE BLADDER 04/16/2006   URINARY INCONTINENCE 04/16/2006   SKIN  CANCER, HX OF 04/16/2006   VERTIGO, HX OF 04/16/2006   RENAL CALCULUS, HX OF 04/16/2006    Conditions to be addressed/monitored:HTN and HLD  Care Plan : Hyperlipidemia with CAD  Updates made by Kassie Mends, RN since 11/28/2020 12:00 AM     Problem: Health Promotion or Disease Self-Management for hyperlipidemia/ CAD   Priority: Medium     Long-Range Goal: Self-Management Plan Developed for hyperlipidemia/ CAD   Start Date: 10/03/2020  Expected End Date: 04/05/2021  This Visit's Progress: On track  Recent Progress: On track  Priority: Medium  Note:   Current Barriers:  Poorly controlled hyperlipidemia, complicated by - unable to take statin drugs Current antihyperlipidemic regimen:  none-  pt trying to manage with diet and exericse Most recent lipid panel:     Component Value Date/Time   CHOL 290 (H) 05/06/2020 0833   TRIG 242 (H) 05/06/2020 0833   HDL 44 05/06/2020 0833   CHOLHDL 6.6 (H) 05/06/2020 0833   CHOLHDL 6.5 (H) 11/05/2016 0945   VLDL 43 (H) 11/03/2015 0825   LDLCALC 199 (H) 05/06/2020 0833   LDLCALC 210 (H) 11/05/2016 0945   ASCVD risk enhancing  conditions: age >83,  HTN.  Patient reports she lives alone and has assistance from adult children if needed, pt continues to drive and is independent with ADL, IADL's.  Patient reports she exercises at her church 2 x per week, likes to cook and tries to eat fresh fruits and vegetables.  Has cane and walker but has not needed to use. Had recent episode of vertigo (has had many times in the past), called EMS and reported "all my vital signs were good", patient called primary care provider and reported and prescribed meclizine which did help.  Pt reports she is keeping meclizine and dramamine with her at all times, has had no further episodes.  Reports has incontinence and wears adult diapers, (for approximately one year now),  pt is trying to drink very little caffeine. Does not adhere to provider recommendations re:  unable to  take statin drugs due to side effects, patient trying to manage with diet and exercise. RN Care Manager Clinical Goal(s):  patient will work with Consulting civil engineer, providers, and care team towards execution of optimized self-health management plan patient will verbalize understanding of plan for hyperlipidemia/ CAD patient will attend all scheduled medical appointments: follow up with primary care provider as needed the patient will demonstrate ongoing self health care management ability Interventions: Collaboration with Susy Frizzle, MD regarding development and update of comprehensive plan of care as evidenced by provider attestation and co-signature Inter-disciplinary care team collaboration (see longitudinal plan of care) Medication review performed; medication list updated in electronic medical record.  Inter-disciplinary care team collaboration (see longitudinal plan of care Reinforced current treatment plan related to hyperlipidemia CAD and patient's adherence to plan as established by provider. Reinforced education re: diet, avoid saturated/ trans fats, choose fresh and frozen foods when you can Reviewed medications with patient and discussed importance of taking as prescribed Reviewed scheduled/upcoming provider appointments including: no scheduled appointments at present, follow up with primary care provider as needed. Reinforced plans with patient for ongoing care management follow up and provided patient with direct contact information for care management team Reviewed importance of continuing to exercise and making healthy food choices Education via My Chart- Incontinence Patient Goals/Self-Care Activities: - talk with your doctor about incontinence issues - look over education sent via My Chart- Incontinence - limit caffeine in your diet - spend time outdoors at least 3 times a week  - continue to exercise 2 x week at your church- keep up the good work - choose fresh or  frozen foods if possible - bake or broil meats Follow Up Plan: Telephone follow up appointment with care management team member scheduled for:   02/06/2021      Care Plan : Hypertension (Adult)  Updates made by Kassie Mends, RN since 11/28/2020 12:00 AM     Problem: Hypertension (Hypertension)   Priority: Medium     Long-Range Goal: Hypertension Monitored   Start Date: 10/03/2020  Expected End Date: 04/05/2021  This Visit's Progress: On track  Recent Progress: On track  Priority: Medium  Note:   Objective:  Last practice recorded BP readings:  BP Readings from Last 3 Encounters:  08/15/20 134/72  04/20/20 134/76  12/28/19 140/70   Most recent eGFR/CrCl: No results found for: EGFR  No components found for: CRCL Current Barriers:  Knowledge Deficits related to basic understanding of hypertension pathophysiology and self care management- patient checks blood pressure on occasion.  Can benefit from hypertension management (low sodium diet, importance of checking blood  pressure at least 3 x per week)  Patient lives alone and is managing well at home, has adult children she can call on if needed. Case Manager Clinical Goal(s):  patient will verbalize understanding of plan for hypertension management patient will attend all scheduled medical appointments: no scheduled appointments at present patient will demonstrate improved adherence to prescribed treatment plan for hypertension as evidenced by taking all medications as prescribed, monitoring and recording blood pressure as directed, adhering to low sodium/DASH diet Interventions:  Collaboration with Susy Frizzle, MD regarding development and update of comprehensive plan of care as evidenced by provider attestation and co-signature Inter-disciplinary care team collaboration (see longitudinal plan of care) Evaluation of current treatment plan related to hypertension self management and patient's adherence to plan as established by  provider. Reinforced education to patient re: stroke prevention, s/s of heart attack and stroke, DASH diet, complications of uncontrolled blood pressure Reviewed medications with patient and discussed importance of compliance Reinforced plans with patient for ongoing care management follow up and provided patient with direct contact information for care management team Reinforced with patient, providing education and rationale, to monitor blood pressure daily and record, calling PCP for findings outside established parameters.  Reviewed scheduled/upcoming provider appointments including: no scheduled appointments at present. Reinforced importance of adhering to low sodium diet Encouraged patient to continue exercising 2 x weekly at her church Reviewed safety precautions- do not ambulate if have vertigo, ask for help as needed and use meclizine as prescribed Self-Care Activities: Attends all scheduled provider appointments Calls provider office for new concerns, questions, or BP outside discussed parameters Checks BP and records as discussed Follows a low sodium diet/DASH diet Patient Goals: - check blood pressure 3 times per week and record in a log - follow low salt diet - continue to exercise at your church - follow safety precautions, do not ambulate if you have vertigo - take all medications as prescribed Follow Up Plan: Telephone follow up appointment with care management team member scheduled for:  02/06/2021     Plan:Telephone follow up appointment with care management team member scheduled for:  02/06/2021  Jacqlyn Larsen [facility], BSN RN Case Manager [facility] [phone]

## 2020-11-30 ENCOUNTER — Ambulatory Visit (INDEPENDENT_AMBULATORY_CARE_PROVIDER_SITE_OTHER): Payer: Medicare Other | Admitting: *Deleted

## 2020-11-30 ENCOUNTER — Other Ambulatory Visit: Payer: Self-pay

## 2020-11-30 DIAGNOSIS — Z23 Encounter for immunization: Secondary | ICD-10-CM

## 2020-12-19 ENCOUNTER — Other Ambulatory Visit (HOSPITAL_COMMUNITY): Payer: Self-pay | Admitting: Family Medicine

## 2020-12-19 DIAGNOSIS — N6320 Unspecified lump in the left breast, unspecified quadrant: Secondary | ICD-10-CM

## 2020-12-22 ENCOUNTER — Telehealth: Payer: Self-pay | Admitting: Internal Medicine

## 2020-12-22 ENCOUNTER — Other Ambulatory Visit: Payer: Self-pay | Admitting: Internal Medicine

## 2020-12-22 DIAGNOSIS — E782 Mixed hyperlipidemia: Secondary | ICD-10-CM

## 2020-12-22 NOTE — Telephone Encounter (Signed)
Left message for patient that a fasting lipid panel has been ordered. Asked if she could have this done before 11/1 visit with MD. Labs can be done at a [facility] location - 2 in [place]

## 2020-12-26 DIAGNOSIS — I10 Essential (primary) hypertension: Secondary | ICD-10-CM | POA: Diagnosis not present

## 2020-12-26 DIAGNOSIS — E782 Mixed hyperlipidemia: Secondary | ICD-10-CM | POA: Diagnosis not present

## 2020-12-27 ENCOUNTER — Other Ambulatory Visit: Payer: Self-pay

## 2020-12-27 ENCOUNTER — Ambulatory Visit: Payer: Medicare Other | Admitting: Internal Medicine

## 2020-12-27 ENCOUNTER — Encounter: Payer: Self-pay | Admitting: Internal Medicine

## 2020-12-27 VITALS — BP 136/60 | HR 70 | Ht 67.0 in | Wt 147.4 lb

## 2020-12-27 DIAGNOSIS — E7849 Other hyperlipidemia: Secondary | ICD-10-CM | POA: Diagnosis not present

## 2020-12-27 DIAGNOSIS — T466X5D Adverse effect of antihyperlipidemic and antiarteriosclerotic drugs, subsequent encounter: Secondary | ICD-10-CM

## 2020-12-27 DIAGNOSIS — I2510 Atherosclerotic heart disease of native coronary artery without angina pectoris: Secondary | ICD-10-CM

## 2020-12-27 DIAGNOSIS — M791 Myalgia, unspecified site: Secondary | ICD-10-CM | POA: Diagnosis not present

## 2020-12-27 DIAGNOSIS — I422 Other hypertrophic cardiomyopathy: Secondary | ICD-10-CM | POA: Diagnosis not present

## 2020-12-27 DIAGNOSIS — I2583 Coronary atherosclerosis due to lipid rich plaque: Secondary | ICD-10-CM

## 2020-12-27 NOTE — Progress Notes (Signed)
OFFICE NOTE  Chief Complaint:  Follow-up dyslipidemia  Primary Care Physician: Susy Frizzle, MD  HPI:  Lauren House is a pleasant 85 year old female I have been following for history of apical hypertrophic cardiomyopathy for which she has done well on Ranexa 500 mg daily. Recently, she was having right inguinal pain and had a hernia for which she had repair. That, unfortunately, she says has caused her more harm than good and she continues to have more pain in her right hip area which possibly is related to the hip. She saw Dr. Wynelle Link at [facility] for further evaluation from that and has received 2 hip injections which have been beneficial. He did mention that he felt she might need a hip replacement. Recently she had a recurrent episode of chest pain. This caused her to the hospital and improved with nitroglycerin. She was ruled out for MI and discharged. Unfortunately she returned several days later after she developed swelling, warmth and pain in her left forearm. It was felt that she might have possibly had an infected IV site and clearly had superficial thrombophlebitis. She was placed on antibiotics for this and is being treated with some improvement in her symptoms. After her emergency room visit, it was recommended that she increase her Ranexa 500 mg twice daily which is the recommended starting dose. She's had no further chest pain episodes. She had prior cardiac catheterization in 2011 which demonstrated a 50-60% LAD lesion. She underwent FFR for this which was negative with an FFR of 0.99. She had had a stress test at that time which indicated no evidence of reversible ischemia., but was having persistent chest pain.  It is been felt that her chest pain was due to small vessel disease in the setting of significant apical variant hypertrophic cardiomyopathy. This has been treated most effectively with ranolazine, and nitrates to some extent. Although her symptoms now  have escalated. This is concerning to me for possible worsening of her LAD disease.  Mrs. Bevill underwent a nuclear stress test on 02/03/2013. This was negative for ischemia with an EF of 52%. Since then she has reported some improvement in her symptoms. Her main complaints now have to do with left hip pain.  She is still contemplating surgery. She reported that her husband was recently diagnosed with esophageal cancer and it seems to be quite advanced. There did not appear to be any surgical options. This has caused her significant anxiety but she reports no chest pain.  Lauren House returns today for follow-up. Unfortunately she mentioned that her husband died about 3 months ago. He was diagnosed with esophageal cancer which was aggressive and inoperable. She is still grieving which is to be expected. She continues to have problems with her hip and at this point apparently will need hip surgery, but probably not hip replacement. That is scheduled about one month from now. She does have a history of moderate LAD stenosis in 2011 with a negative stress test one year ago. Recently, she has had worsening shortness of breath and chest pressure with exertion. She initially felt this was due to stress however her symptoms have been somewhat progressive and concern me for ischemia. Her EKG shows stable T-wave inversions consistent with her hypertrophic cardiomyopathy.  I saw Lauren House back in the office today. Overall she feels like she is doing well. She did have some mild chest discomfort last week and took a nitroglycerin. This is when she was in [place] with her son  and daughter-in-law and they were walking all over town. She managed to walk for several hours without any significant shortness of breath but did have some mild chest discomfort which was relieved fairly quickly with nitroglycerin. Overall she is doing well. She had her hip replacement on the left side which she is recovering from. She still has  discomfort when sitting in a car for long periods of time. She tells me that she's given a be going up to [place] fairly shortly on a vacation and that they will be going up to [facility] for the first time.'  Lauren House returns today for follow-up. She tells me on Saturday night she was awakened early in the morning with palpitations and a warm feeling as well as heaviness in her chest. She says it was quite intense. She denies any vivid dreams prior to that. She's not had any further episodes of chest pain since that or recurrent palpitations. She took a nitroglycerin and the symptoms resolved fairly promptly. She was able to go back to bed. Her EKGs is unchanged showing the T-wave inversions typical of apical variant hypertrophic cardiomyopathy. Her last stress test was in 2014 it was negative for ischemia. She does have a known 50-60% LAD stenosis which was negative by FFR.   07/12/2015  Lauren House was seen today in the office for follow-up. Recently she went to vacation with her family and they noticed more progressive shortness of breath and chest pressure. This was keeping her from doing certain activities. She says her symptoms of gotten worse since her last office visit in December. At that time I performed a nuclear stress test which showed a low normal EF 51% but no reversible ischemia. She does have known 50-60% LAD stenosis which was negative by FFR by catheterization in 2012.  08/26/2015  Lauren House returns today for follow-up. She underwent cardiac catheterization for progressive chest pain concerning for unstable angina on 07/22/2015 by Dr. Ellyn Hack, this demonstrated the following:  Mid LAD lesion, 80% stenosed. Post intervention with Promus DES 2.25 mm x 16 mm (2.4 mm), there is a 0% residual stenosis. Ost LAD lesion, 50% stenosed. stable from original catheterization The left ventricular systolic function is normal. with minimal apical filling consistent with known apical  hypertrophy. Otherwise mild to moderate disease in the circumflex and RCA system   Since her coronary angiography and stent placement, she reports she is doing fairly well. She started back doing some exercise and push mowed her lawn the other day. Unfortunate she developed a cracked tooth and she's been having problems with pain. She took antibiotics including doxycycline which causes her stomach upset. She has a temporary crown and will be going back for some more dental work. On questioning today she reports that she has discontinued her Plavix. She said she's been off of it for about one week because of spontaneous bruising. She was not counseled to discontinue this and I reiterated the fact today that being off of dual antiplatelet therapy could put her at risk for acute in-stent thrombosis which could lead to massive heart attack.  11/18/2015  Lauren House was seen today in follow-up. She has been undergoing cardiac rehabilitation. She restart her Plavix which is very important given her recent stent in May of this past year. She denies any chest pain but still gets short of breath. She says is fairly stable but not necessarily getting worse. She is now gone through 8 sessions rehabilitation and is up to 3.4 metabolic  equivalents. Blood pressures been fairly well-controlled.  05/22/2016  Lauren House returns today for follow-up. When I last saw her she was undergoing cardiac rehabilitation and reported shortness of breath with exertion. This was shortly after her stenting I recommended continued cardiac rehabilitation. She says that after finishing rehabilitation she has not had any improvement in her shortness of breath and perhaps it may be worse. She notes it when walking up and down her stairs to her basement. She says after the second trip up the stairs she does get short of breath. She is under a lot of stress and there are some family issues which required her grandson to move in with her. She  denies any chest pain. Blood pressure was elevated today 161/85. She says it is always up in the office, however was 140/80 last time.  06/26/2016  Lauren House was seen today in follow-up. Her echocardiogram shows preserved systolic function, mild diastolic dysfunction and elevated LV filling pressure. We understand that she would have diastolic dysfunction secondary to apical hypertrophic cardiomyopathy. She continues to have shortness of breath with exertion. Her BNP is very low less than 100 and stable. I suspect her symptoms may be due to diastolic dysfunction elevated LVEDP. Blood pressure was over 716 systolic today. In addition, recent lipid profile indicated 2 cholesterol 279, triglycerides 216, HDL 48 and LDL-C1 88. Given her coronary artery disease her goal LDL-C is less than 70. She has been intolerant to numerous statins including rosuvastatin, atorvastatin and pravastatin. She is close to one year since her drug-eluting stent in the LAD.  01/01/2017  Mrs. Stanke returns today for follow-up.  General she seems to be doing pretty well.  In May we referred her for evaluation to start a PC SK 9 inhibitor.  She had a discussion about starting Repatha however was concerned about cost since she did not have drug coverage.  She is also concerned about not taking the medicine.  She says she ultimately wants to be with her deceased husband.  That being said, she is willing to entertain the medication after I told her today that the cost of come down significantly.  Her cholesterol was recently assessed and LDL remains over 200 untreated.  She has been intolerant to statins as previously outlined in my notes.  Also mentions she has had shingles and 2 bouts of pneumonia since I last saw her.  04/10/2017  Mrs. Mette was seen today in follow-up.  Recently she was seen by primary care provider for bronchitis/pharyngitis.  She was treated with azithromycin.  She still has some scratchiness in her throat but is  improving.  She reports she does get short of breath with exertion, worse than it was a couple years ago but generally has been stable.  She denies any angina.  She is thought about the injectable PCS K9 inhibitors, but declines it at this time.  Unfortunately, her son recently died after being found to have cancer.  12/18/2017  Lauren House is seen today in follow-up.  Recently she has been doing some traveling with her son.  She went to [place] the [place] and another religious museum.  They were walking all day and she did get short of breath but denied any chest pain with this.  Overall she is done fairly well.  She is a little nervous about the fact that her Ranexa is now going to be generic, but I reassured her the medication would be very similar.  Blood  pressure was well controlled today.  Her EKG appears unchanged.  Unfortunately she had a recent mammogram which was abnormal and they are concerned about a chest wall cyst.  She is concerned that this may be cancer.  12/18/2019  Lauren House is seen today in follow-up.  Unfortunately she recently had COVID-19.  She was symptomatic for several days.  She had been previously vaccinated.  She underwent antibody infusion therapy and got better much quicker.  She is also been complaining of some leg pain.  She has bilateral varicose veins but has not used compression stockings.  EKG shows no significant new changes.  She denies any new chest pain.  She still has a persistently elevated cholesterol.  She has not been interested in additional therapies in the past and was intolerant to statins.  Her LDL was 210 in 2018 with a total cholesterol 300.  She does not eat a high cholesterol or saturated fat diet and therefore most likely has a familial hyperlipidemia.  04/20/2020  Lauren House is seen today in follow-up. Unfortunately she was hesitant to add extra medications. We did discuss the fact that she might qualify for health will grant in  which case she may get Repatha for free. Her cholesterol is significantly elevated and she is at high risk of future cardiovascular events. She did seem amenable to taking the medication if it was free after I explained it to her further.  12/27/2020  Lauren House returns today for follow-up.  Unfortunately she never started on Repatha.  She says she has some concerns about the medication.  It may be due to cost or the fact that she thought she might have side effects or even she said she is now almost 85 years old.  I am not sure the relevance of that since 85 year old right much higher risk of heart disease which could still be prevented by lipid-lowering.  Nonetheless we again had a long discussion about the medication.  I feel like it would be very beneficial for her and probably we can get it at at a cost effective price.  Her cholesterol remains quite high.  In March her total was 290, LDL 199.  Findings consistent with a familial hyperlipidemia.  PMHx:  Past Medical History:  Diagnosis Date   Anxiety    Apical variant hypertrophic cardiomyopathy ([place])    Arthritis    Borderline diabetes mellitus    Bruises easily    Bursitis of left hip    Coronary atherosclerosis of native coronary artery    a. 2011: Cath showing nonobstructive CAD - 50-60% LAD (normal FFR) b. 06/2015: Cath showing 80% stenosis in the Mid LAD, tx w/ Promus DES 2.25 mm x 16 mm    Essential hypertension, benign    History of kidney stones    History of skin cancer    Basal cell - abdomen   Hyperlipidemia    Diet controlled.cannot take meds   Nocturia    Osteoporosis    Overactive bladder    Shortness of breath dyspnea    Vertigo     Past Surgical History:  Procedure Laterality Date   ABDOMINAL HYSTERECTOMY     CARDIAC CATHETERIZATION N/A 07/22/2015   Procedure: Left Heart Cath and Coronary Angiography;  Surgeon: Leonie Man, MD;  Location: [facility];  Service: Cardiovascular;  Laterality: N/A;    CARDIAC CATHETERIZATION N/A 07/22/2015   Procedure: Coronary Stent Intervention;  Surgeon: Leonie Man, MD;  Location: [facility];  Service: Cardiovascular;  Laterality: N/A;   CORONARY STENT PLACEMENT  07/22/2015   mid lad  des    EYE SURGERY     cataracts bilateral   HEMORRHOID SURGERY     INGUINAL HERNIA REPAIR  10/11/2011   Procedure: HERNIA REPAIR INGUINAL ADULT;  Surgeon: Stark Klein, MD;  Location: [facility];  Service: General;  Laterality: Right;   OPEN SURGICAL REPAIR OF GLUTEAL TENDON Left 03/12/2014   Procedure: LEFT HIP BURSECTOMY AND GLUTEAL TENDON REPAIR;  Surgeon: [name], MD;  Location: [facility];  Service: Orthopedics;  Laterality: Left;    FAMHx:  Family History  Problem Relation Age of Onset   Heart disease Father    Lung cancer Sister    Lung cancer Brother     SOCHx:   reports that she has never smoked. She has never used smokeless tobacco. She reports that she does not drink alcohol and does not use drugs.  ALLERGIES:  Allergies  Allergen Reactions   Chlorhexidine Itching   Vancomycin Hives   Aspirin Other (See Comments)    Nose bleeds with full strength   Atorvastatin Other (See Comments)    Muscle pain   Bactrim [Sulfamethoxazole-Trimethoprim] Other (See Comments)    Doesn't remember    Cephalosporins Hives and Itching   Ciprofloxacin Nausea Only   Clarithromycin Other (See Comments)    Doesn't remember    Colestipol Hcl Other (See Comments)    Hallucinations   Ezetimibe Other (See Comments)    GI upset   Prednisone    Simvastatin Other (See Comments)    Muscle spasms   Penicillins Rash    Has patient had a PCN reaction causing immediate rash, facial/tongue/throat swelling, SOB or lightheadedness with hypotension: yes Has patient had a PCN reaction causing severe rash involving mucus membranes or skin necrosis: no Has patient had a PCN reaction that required hospitalization no Has patient had a PCN reaction occurring within the last  10 years: no If all of the above answers are "NO", then may proceed with Cephalosporin use.    Tape Rash    ROS: Pertinent items noted in HPI and remainder of comprehensive ROS otherwise negative.  HOME MEDS: Current Outpatient Medications  Medication Sig Dispense Refill   alendronate (FOSAMAX) 70 MG tablet Take 1 tablet (70 mg total) by mouth every 7 (seven) days. Take with a full glass of water on an empty stomach. 4 tablet 11   aspirin EC 81 MG tablet Take 81 mg by mouth daily.     Butalbital-APAP-Caffeine 50-300-40 MG CAPS Take 1 tablet by mouth every 6 (six) hours as needed (migraine). 10 capsule 0   Cyanocobalamin (VITAMIN B12 PO) Take 1 tablet by mouth daily.     fluticasone (FLONASE) 50 MCG/ACT nasal spray USE TWO SPRAY IN EACH NOSTRIL EVERY DAY 50 g 1   hydrocortisone cream 1 % Apply 1 application topically 2 (two) times daily. 30 g 0   metoprolol tartrate (LOPRESSOR) 25 MG tablet Take 1 tablet (25 mg total) by mouth 2 (two) times daily. 180 tablet 1   Multiple Vitamins-Minerals (OCUVITE PO) Take 1 tablet by mouth daily.     nitroGLYCERIN (NITROSTAT) 0.4 MG SL tablet Place 1 tablet (0.4 mg total) under the tongue every 5 (five) minutes as needed for chest pain. 25 tablet 3   Polyethyl Glycol-Propyl Glycol (SYSTANE OP) Apply 1 drop to eye 3 (three) times daily as needed (Dry eyes).     ranolazine (RANEXA) 500 MG  12 hr tablet Take 1 tablet (500 mg total) by mouth 2 (two) times daily. 180 tablet 3   senna-docusate (SENOKOT-S) 8.6-50 MG tablet Take 1 tablet by mouth at bedtime as needed for mild constipation or moderate constipation. 20 tablet 0   No current facility-administered medications for this visit.    LABS/IMAGING: No results found for this or any previous visit (from the past 48 hour(s)). No results found.  VITALS: BP 136/60   Pulse 70   Ht 5\' 7"  (1.702 m)   Wt 147 lb 6.4 oz (66.9 kg)   SpO2 97%   BMI 23.09 kg/m   EXAM: General appearance: alert and no  distress Neck: no carotid bruit, no JVD and thyroid not enlarged, symmetric, no tenderness/mass/nodules Lungs: clear to auscultation bilaterally Heart: regular rate and rhythm, S1, S2 normal, no murmur, click, rub or gallop Abdomen: soft, non-tender; bowel sounds normal; no masses,  no organomegaly Extremities: extremities normal, atraumatic, no cyanosis or edema Pulses: 2+ and symmetric Skin: Skin color, texture, turgor normal. No rashes or lesions Neurologic: Grossly normal Psych: Pleasant  EKG: Sinus bradycardia at 59, possible left atrial enlargement, stable anterolateral T wave changes-personally reviewed  ASSESSMENT: History of DOE - echo with normal systolic function, mild diastolic dysfunction and elevated LVEDP  Unstable angina - status post PCI with a Promus DES (2.25 mm x 16 mm to the mid LAD)- known 50-60% ostial LAD stenosis (FFR 0.99 in 2011) which was stable at repeat cath (06/2015) Apical variant hypertrophic cardimyopathy - anterolateral TWI's - negative nuclear stress test 01-2015 Hypertension Venous insufficiency Right inguinal pain with a hernia, status post surgical repair Right hip pain with degenerative hip disease - s/p surgery Statin intolerance - myalgias Probable Familial Hyperlipidemia (FH)  PLAN: 1.   Mrs. Altamirano continues to have significantly elevated cholesterol.  After much discussion she seemed to be interested considering a PCSK9 inhibitor.  We will pursue that.  Plan repeat lipids in about 3 months.  Follow-up with me afterwards.  [name], MD, [facility], [place] Director of the Advanced Lipid Disorders &  Cardiovascular Risk Reduction Clinic Diplomate of the American Board of Clinical Lipidology Attending Cardiologist  Direct Dial: [phone]  Fax: [phone]  Website:  [email or web address] [email or web address] Calvert Charland 12/27/2020, 1:30 PM

## 2020-12-27 NOTE — Patient Instructions (Signed)
Medication Instructions:  Your physician recommends that you continue on your current medications as directed. Please refer to the Current Medication list given to you today.  Dr.Hilty's nurse, Eliezer Lofts, will call you to discuss starting Repatha.  *If you need a refill on your cardiac medications before your next appointment, please call your pharmacy*   Lab Work:  Fasting lipids in 6 months at [facility] lab  If you have labs (blood work) drawn today and your tests are completely normal, you will receive your results only by: [place] (if you have MyChart) OR A paper copy in the mail If you have any lab test that is abnormal or we need to change your treatment, we will call you to review the results.   Testing/Procedures: None today    Follow-Up: At [facility], you and your health needs are our priority.  As part of our continuing mission to provide you with exceptional heart care, we have created designated Provider Care Teams.  These Care Teams include your primary Cardiologist (physician) and Advanced Practice Providers (APPs -  Physician Assistants and Nurse Practitioners) who all work together to provide you with the care you need, when you need it.  We recommend signing up for the patient portal called "MyChart".  Sign up information is provided on this After Visit Summary.  MyChart is used to connect with patients for Virtual Visits (Telemedicine).  Patients are able to view lab/test results, encounter notes, upcoming appointments, etc.  Non-urgent messages can be sent to your provider as well.   To learn more about what you can do with MyChart, go to [email or web address].    Your next appointment:   6 month(s)  The format for your next appointment:   In Person  Provider:   Dr.Hilty   Other Instructions None

## 2020-12-29 ENCOUNTER — Ambulatory Visit (HOSPITAL_COMMUNITY)
Admission: RE | Admit: 2020-12-29 | Discharge: 2020-12-29 | Disposition: A | Discharge status: Home / Self Care | Payer: Medicare Other | Source: Ambulatory Visit | Attending: Family Medicine | Admitting: Family Medicine

## 2020-12-29 ENCOUNTER — Other Ambulatory Visit: Payer: Self-pay

## 2020-12-29 DIAGNOSIS — N6320 Unspecified lump in the left breast, unspecified quadrant: Secondary | ICD-10-CM

## 2020-12-29 DIAGNOSIS — N6324 Unspecified lump in the left breast, lower inner quadrant: Secondary | ICD-10-CM | POA: Insufficient documentation

## 2020-12-29 DIAGNOSIS — Z1231 Encounter for screening mammogram for malignant neoplasm of breast: Secondary | ICD-10-CM | POA: Insufficient documentation

## 2020-12-29 DIAGNOSIS — D1739 Benign lipomatous neoplasm of skin and subcutaneous tissue of other sites: Secondary | ICD-10-CM | POA: Diagnosis not present

## 2020-12-29 DIAGNOSIS — N63 Unspecified lump in unspecified breast: Secondary | ICD-10-CM

## 2020-12-29 DIAGNOSIS — R922 Inconclusive mammogram: Secondary | ICD-10-CM | POA: Diagnosis not present

## 2021-01-20 IMAGING — US US EXTREM LOW VENOUS*R*
1 series · 13 of 24 positions shown · non-contrast
Comparison: None.

CLINICAL DATA: Right lower extremity pain and edema for the past 2
weeks. Evaluate for DVT.



[Series 1: us venous img lower uni right (dvt) · portal-venous · 13 of 41 slices shown]
[im 1/41]
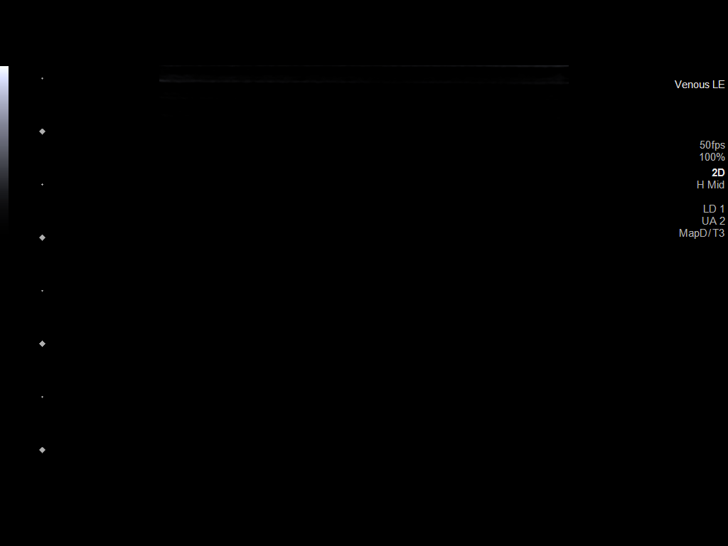
[im 4/41]
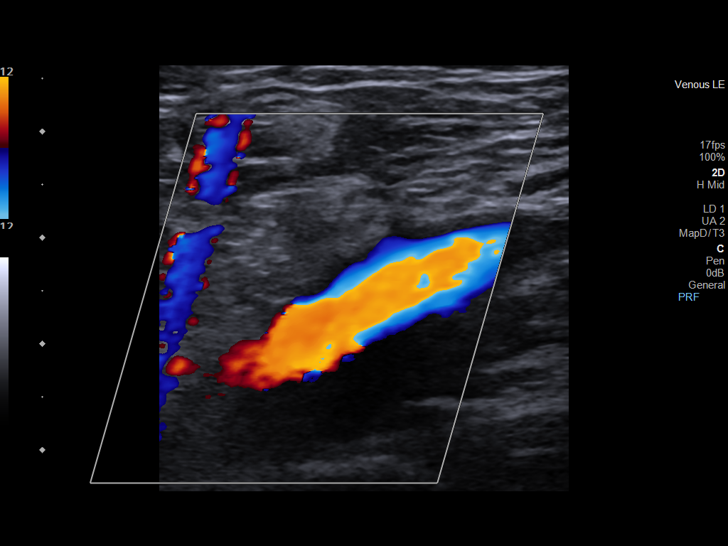
[im 7/41]
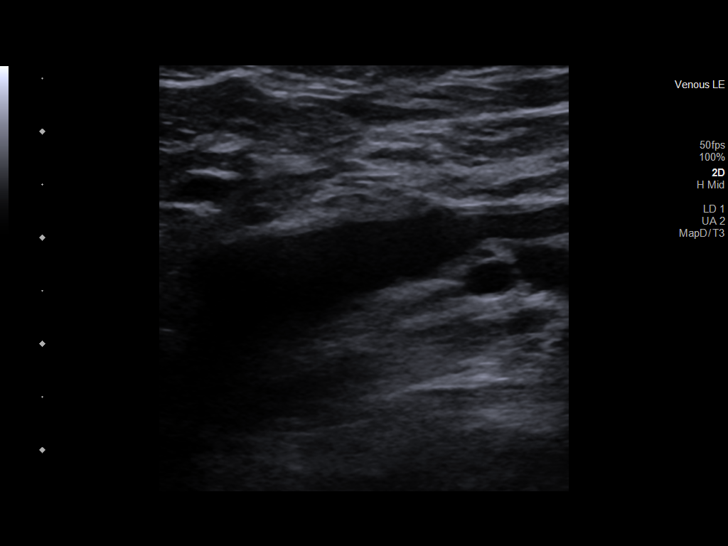
[im 11/41]
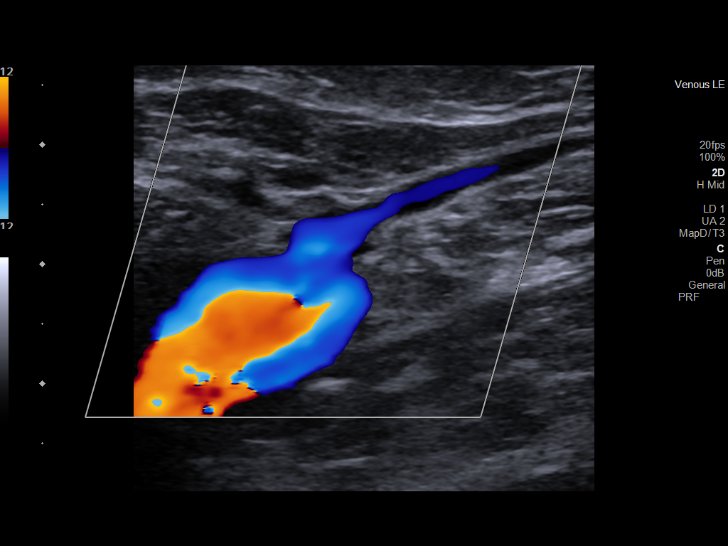
[im 14/41]
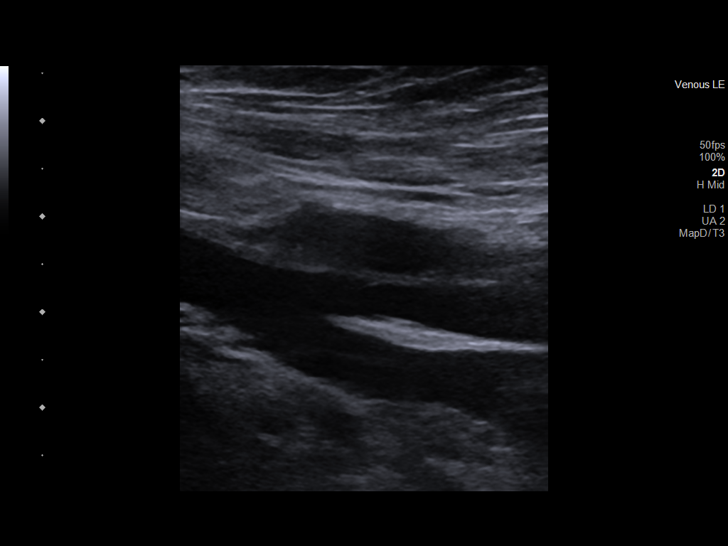
[im 18/41]
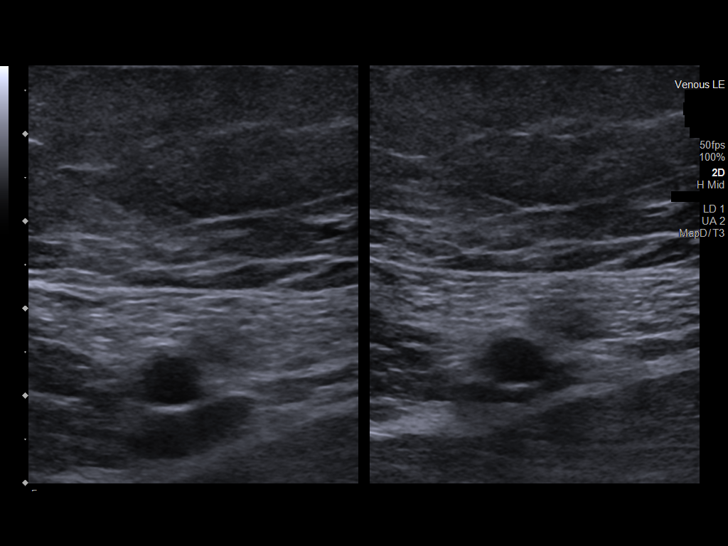
[im 21/41]
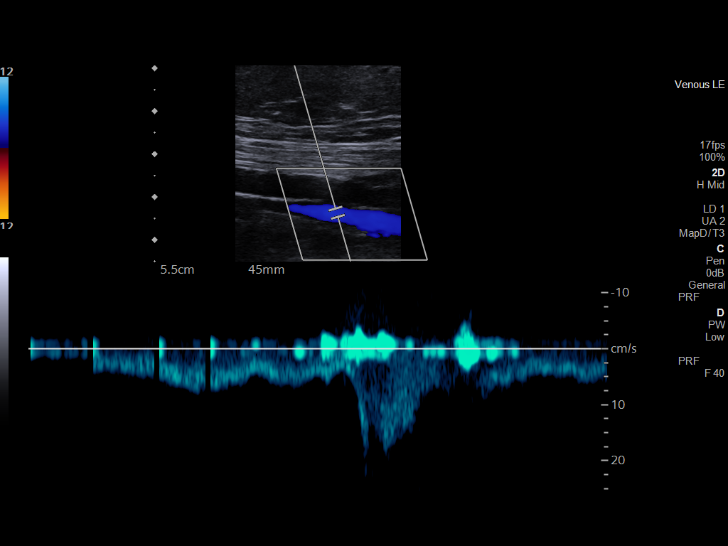
[im 23/41]
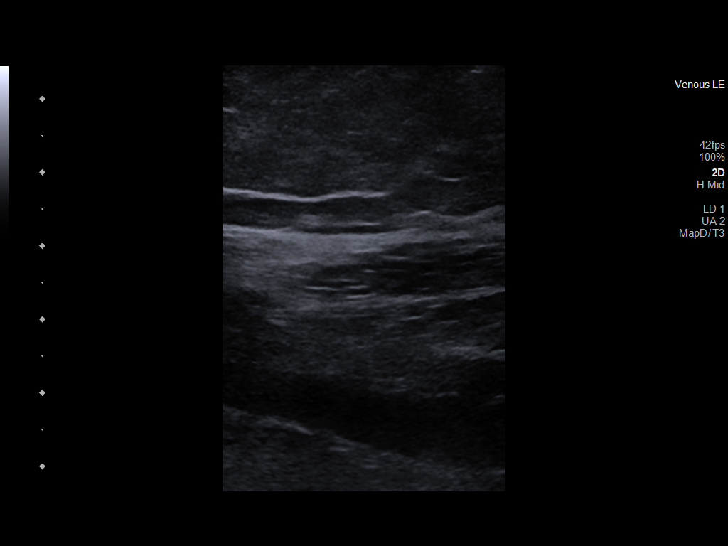
[im 27/41]
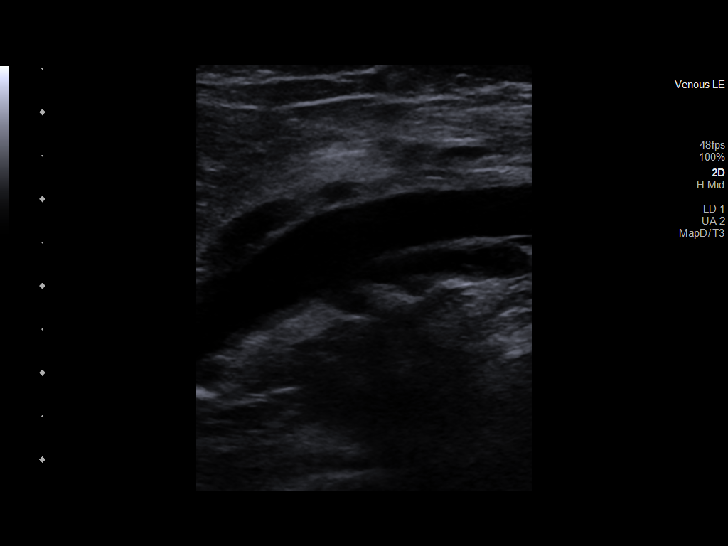
[im 30/41]
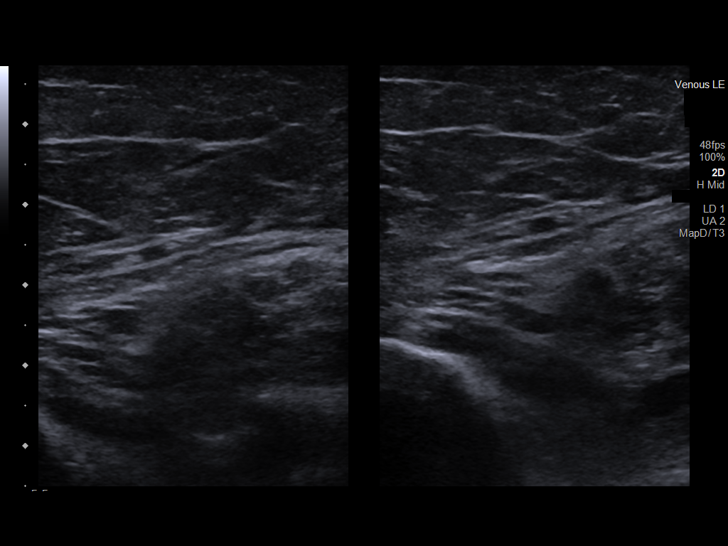
[im 34/41]
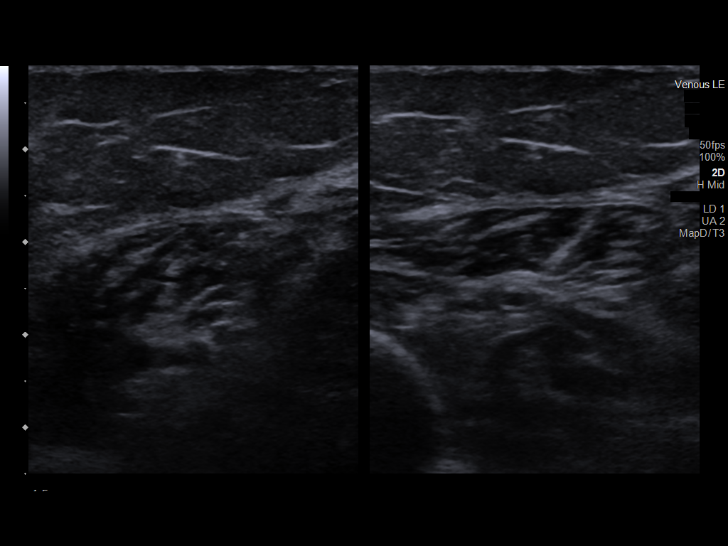
[im 37/41]
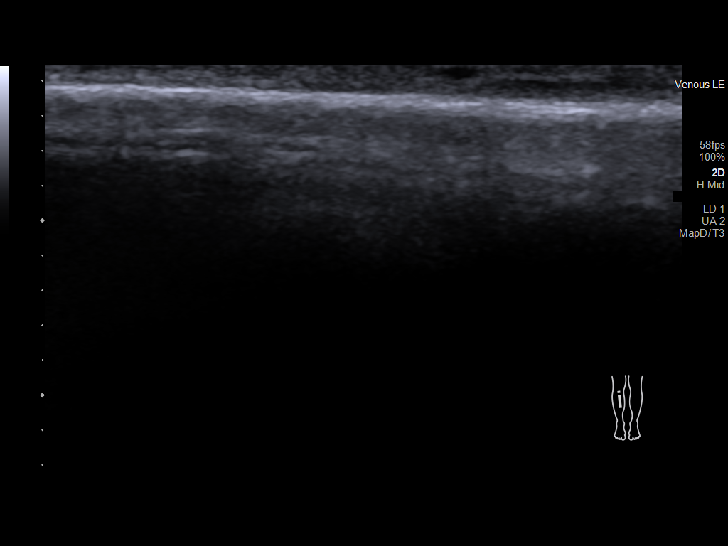
[im 41/41]
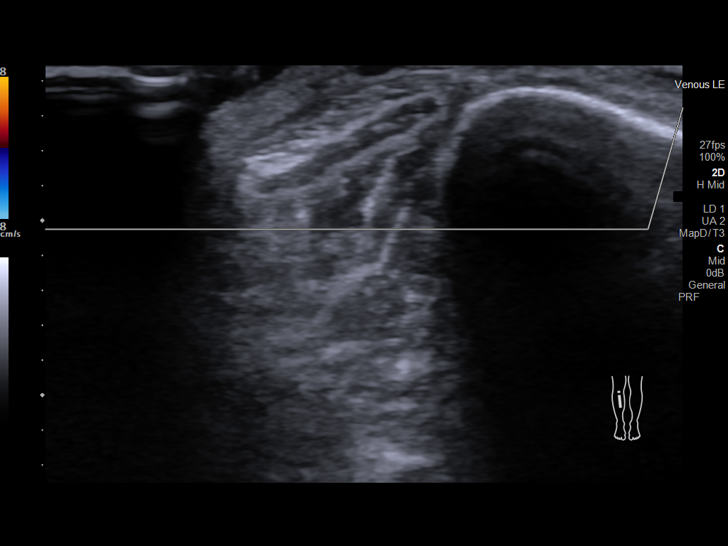

[13 of 24 positions shown; findings below may reference images not displayed]

FINDINGS: Contralateral Common Femoral Vein: Respiratory phasicity is normal
and symmetric with the symptomatic side. No evidence of thrombus.
Normal compressibility.

Common Femoral Vein: No evidence of thrombus. Normal
compressibility, respiratory phasicity and response to augmentation.

Saphenofemoral Junction: No evidence of thrombus. Normal
compressibility and flow on color Doppler imaging.

Profunda Femoral Vein: No evidence of thrombus. Normal
compressibility and flow on color Doppler imaging.

Femoral Vein: No evidence of thrombus. Normal compressibility,
respiratory phasicity and response to augmentation.

Popliteal Vein: No evidence of thrombus. Normal compressibility,
respiratory phasicity and response to augmentation.

Calf Veins: No evidence of thrombus. Normal compressibility and flow
on color Doppler imaging.

Superficial Great Saphenous Vein: No evidence of thrombus. Normal
compressibility.

Venous Reflux:  None.

Other Findings: A minimal amount of subcutaneous edema correlates
with the patient's palpable area of concern involving the mid aspect
the right lower leg/shin (images 38 through 42).
IMPRESSION: 1. No evidence of DVT within the right lower extremity.
2. Minimal amount of subcutaneous edema correlates with the
patient's palpable area of concern involving the mid aspect of the
right lower leg/shin.

## 2021-01-24 ENCOUNTER — Telehealth: Payer: Self-pay | Admitting: Internal Medicine

## 2021-01-24 MED ORDER — REPATHA SURECLICK 140 MG/ML ~~LOC~~ SOAJ: 1.0000 | SUBCUTANEOUS | 11 refills | Status: DC

## 2021-01-24 NOTE — Telephone Encounter (Signed)
Rx for Repatha sent to pharmacy Unsure if PA is required - will await info from pharmacy   Copied from 04/21/20 phone note: Regarding repatha [employer]click prescription from 04/20/20: Per review of chart, patient does NOT have medicare part D drug coverage Spoke with [facility] and they bill her GoodRx card for prescriptions In speaking with our clinical pharmacy team, patient will need part D coverage to apply for grant from [place] She can apply for [employer] Safety Net assistance for repatha BUT this would only be for 2022 if she gets approved She would then need to obtain part D drug coverage in 2023 and could potentially obtain healthwell grant at that time

## 2021-01-30 NOTE — Telephone Encounter (Signed)
Spoke with [facility] staff about PCSK9 -- she has no drug coverage so medication will cost $545  She does not have part D drug coverage and states she tried to sign up last year, they made her/her daughter go thru so many hoops, were going to penalize her ($) and she just said "forget it"  Therefore, she does NOT have drug coverage currently and will not be getting drug coverage for 2023  Advised will route to MD to review

## 2021-01-31 NOTE — Telephone Encounter (Signed)
She told me at our recent office visit that she doesn't want to take it anyhow .. so I'll just document that and cost prohibitive  Dr Lemmie Evens

## 2021-02-06 ENCOUNTER — Telehealth: Payer: Medicare Other

## 2021-02-15 ENCOUNTER — Telehealth: Payer: Self-pay | Admitting: Family Medicine

## 2021-02-15 NOTE — Telephone Encounter (Signed)
Left message for patient to call back and schedule Medicare Annual Wellness Visit (AWV) in office.   If not able to come in office, please offer to do virtually or by telephone.  Left office number and my jabber [phone].  Last AWV:12/28/2019  Please schedule at anytime with Nurse Health Advisor.

## 2021-02-17 ENCOUNTER — Ambulatory Visit: Payer: Medicare Other | Admitting: *Deleted

## 2021-02-17 DIAGNOSIS — E782 Mixed hyperlipidemia: Secondary | ICD-10-CM

## 2021-02-17 DIAGNOSIS — I10 Essential (primary) hypertension: Secondary | ICD-10-CM

## 2021-02-17 NOTE — Patient Instructions (Addendum)
Visit Information  Thank you for taking time to visit with me today. Please don't hesitate to contact me if I can be of assistance to you before our next scheduled telephone appointment.  Following are the goals we discussed today:  Take medications as prescribed   Attend all scheduled provider appointments Perform all self care activities independently  Perform IADL's (shopping, preparing meals, housekeeping, managing finances) independently Call provider office for new concerns or questions  check blood pressure 3 times per week write blood pressure results in a log or diary take blood pressure log to all doctor appointments call doctor for signs and symptoms of high blood pressure take medications for blood pressure exactly as prescribed report new symptoms to your doctor eat more whole grains, fruits and vegetables, lean meats and healthy fats call for medicine refill 2 or 3 days before it runs out call doctor with any symptoms you believe are related to your medicine Follow heart healthy diet, avoid trans/ saturated fats Continue to eliminate caffeine from your diet  Our next appointment is by telephone on 05/01/2021 at 3 pm  Please call the care guide team at [phone] if you need to cancel or reschedule your appointment.   If you are experiencing a Mental Health or [place] or need someone to talk to, please call the [place] National Suicide Prevention Lifeline: 628 525 3352 or TTY: (908)757-6502 TTY 469 873 7774) to talk to a trained counselor call 1-800-273-TALK (toll free, 24 hour hotline) go to [facility] [address], [place] [phone]) call the [place]: [phone] call 911   Patient verbalizes understanding of instructions provided today and agrees to view in [place].   Jacqlyn Larsen RNC, BSN RN Case Manager [facility] [phone]

## 2021-02-17 NOTE — Chronic Care Management (AMB) (Signed)
Chronic Care Management   CCM RN Visit Note  02/17/2021 Name: Lauren House MRN: [id number] DOB: 1936/01/07  Subjective: Lauren House is a 85 y.o. year old female who is a primary care patient of Pickard, Cammie Mcgee, MD. The care management team was consulted for assistance with disease management and care coordination needs.    Engaged with patient by telephone for follow up visit in response to provider referral for case management and/or care coordination services.   Consent to Services:  The patient was given information about Chronic Care Management services, agreed to services, and gave verbal consent prior to initiation of services.  Please see initial visit note for detailed documentation.   Patient agreed to services and verbal consent obtained.   Assessment: Review of patient past medical history, allergies, medications, health status, including review of consultants reports, laboratory and other test data, was performed as part of comprehensive evaluation and provision of chronic care management services.   SDOH (Social Determinants of Health) assessments and interventions performed:    CCM Care Plan  Allergies  Allergen Reactions   Chlorhexidine Itching   Vancomycin Hives   Aspirin Other (See Comments)    Nose bleeds with full strength   Atorvastatin Other (See Comments)    Muscle pain   Bactrim [Sulfamethoxazole-Trimethoprim] Other (See Comments)    Doesn't remember    Cephalosporins Hives and Itching   Ciprofloxacin Nausea Only   Clarithromycin Other (See Comments)    Doesn't remember    Colestipol Hcl Other (See Comments)    Hallucinations   Ezetimibe Other (See Comments)    GI upset   Prednisone    Simvastatin Other (See Comments)    Muscle spasms   Penicillins Rash    Has patient had a PCN reaction causing immediate rash, facial/tongue/throat swelling, SOB or lightheadedness with hypotension: yes Has patient had a PCN reaction causing severe rash  involving mucus membranes or skin necrosis: no Has patient had a PCN reaction that required hospitalization no Has patient had a PCN reaction occurring within the last 10 years: no If all of the above answers are "NO", then may proceed with Cephalosporin use.    Tape Rash    Outpatient Encounter Medications as of 02/17/2021  Medication Sig Note   alendronate (FOSAMAX) 70 MG tablet Take 1 tablet (70 mg total) by mouth every 7 (seven) days. Take with a full glass of water on an empty stomach. 04/20/2020: Patient has not started yet   aspirin EC 81 MG tablet Take 81 mg by mouth daily.    Butalbital-APAP-Caffeine 50-300-40 MG CAPS Take 1 tablet by mouth every 6 (six) hours as needed (migraine).    Cyanocobalamin (VITAMIN B12 PO) Take 1 tablet by mouth daily.    Evolocumab (REPATHA SURECLICK) 875 MG/ML SOAJ Inject 1 Dose into the skin every 14 (fourteen) days.    fluticasone (FLONASE) 50 MCG/ACT nasal spray USE TWO SPRAY IN EACH NOSTRIL EVERY DAY    hydrocortisone cream 1 % Apply 1 application topically 2 (two) times daily.    metoprolol tartrate (LOPRESSOR) 25 MG tablet Take 1 tablet (25 mg total) by mouth 2 (two) times daily.    Multiple Vitamins-Minerals (OCUVITE PO) Take 1 tablet by mouth daily.    nitroGLYCERIN (NITROSTAT) 0.4 MG SL tablet Place 1 tablet (0.4 mg total) under the tongue every 5 (five) minutes as needed for chest pain.    Polyethyl Glycol-Propyl Glycol (SYSTANE OP) Apply 1 drop to eye 3 (three) times daily as  needed (Dry eyes).    ranolazine (RANEXA) 500 MG 12 hr tablet Take 1 tablet (500 mg total) by mouth 2 (two) times daily.    senna-docusate (SENOKOT-S) 8.6-50 MG tablet Take 1 tablet by mouth at bedtime as needed for mild constipation or moderate constipation.    No facility-administered encounter medications on file as of 02/17/2021.    Patient Active Problem List   Diagnosis Date Noted   Blood clotting disorder ([place]) 08/15/2020   Coronary artery disease due to lipid  rich plaque 01/01/2017   CAD S/P percutaneous coronary angioplasty 07/22/2015   DOE (dyspnea on exertion)    Unstable angina ([facility]) 07/12/2015   Heart palpitations 02/14/2015   Hip bursitis 03/13/2014   Trochanteric bursitis of left hip 03/12/2014   Exertional chest pain 02/10/2014   Preoperative cardiovascular examination 02/10/2014   Angina pectoris, variant ([place]) 12/24/2012   Left hip pain 12/16/2012   Bursitis of left hip 12/16/2012   Osteoarthritis, hip, bilateral 12/16/2012   Chest pain 08/15/2012   Apical variant hypertrophic cardiomyopathy ([place]) 08/15/2012   Bilateral inguinal hernia, right symptomatic 09/21/2011   COMEDO 10/18/2008   POSTMENOPAUSAL OSTEOPOROSIS 09/01/2008   HIP PAIN, LEFT 01/19/2008   CARCINOMA, BASAL CELL, SKIN 10/02/2006   VARICOSE VEIN 09/24/2006   ACTINIC KERATOSIS 09/24/2006   NECK PAIN 09/24/2006   BACK PAIN 09/24/2006   NEOP, BNG, SCALP/SKIN, NECK 04/16/2006   FIBROIDS, UTERUS 04/16/2006   Mixed hyperlipidemia 04/16/2006   MACULAR DEGENERATION 04/16/2006   Essential hypertension 04/16/2006   ALLERGIC RHINITIS 04/16/2006   OVERACTIVE BLADDER 04/16/2006   URINARY INCONTINENCE 04/16/2006   SKIN CANCER, HX OF 04/16/2006   VERTIGO, HX OF 04/16/2006   RENAL CALCULUS, HX OF 04/16/2006    Conditions to be addressed/monitored:HTN and HLD  Care Plan : RN Care Manager Plan of Care  Updates made by Kassie Mends, RN since 02/17/2021 12:00 AM     Problem: No plan of care established for management of chronic disease states (HTN, HLD)   Priority: High     Long-Range Goal: Development of plan of care for chronic disease management  (HTN, HLD)   Start Date: 02/17/2021  Expected End Date: 08/16/2021  Priority: High  Note:   Current Barriers:  Knowledge Deficits related to plan of care for management of HTN and HLD  ASCVD risk enhancing conditions: age >73,  HTN.  Patient reports she lives alone and has assistance from adult children if needed, pt  continues to drive and is independent with ADL, IADL's.  Patient reports she exercises at her church 2 x per week and this has helped her balance, likes to cook and tries to eat fresh fruits and vegetables.  Has cane and walker but has not needed to use. Pt denies any recent episodes of vertigo  Pt reports she is keeping meclizine and dramamine with her at all times, has had no further episodes.  Reports has incontinence and wears adult diapers, (for approximately one year now),  pt is trying to drink very little caffeine. Does not adhere to provider recommendations re:  unable to take statin drugs due to side effects, patient trying to manage with diet and exercise. Knowledge Deficits related to basic understanding of hypertension pathophysiology and self care management- patient checks blood pressure on occasion.  Can benefit from hypertension management (low sodium diet, importance of checking blood pressure at least 3 x per week)  Patient lives alone and is managing well at home, has adult children she can call on  if needed.  RNCM Clinical Goal(s):  Patient will verbalize understanding of plan for management of HTN and HLD as evidenced by pt report, review of EHR and  through collaboration with RN Care manager, provider, and care team.   Interventions: 1:1 collaboration with primary care provider regarding development and update of comprehensive plan of care as evidenced by provider attestation and co-signature Inter-disciplinary care team collaboration (see longitudinal plan of care) Evaluation of current treatment plan related to  self management and patient's adherence to plan as established by provider   Hyperlipidemia:  (Status: Goal on Track (progressing): YES.) Long Term Goal  Lab Results  Component Value Date   CHOL 290 (H) 05/06/2020   HDL 44 05/06/2020   LDLCALC 199 (H) 05/06/2020   TRIG 242 (H) 05/06/2020   CHOLHDL 6.6 (H) 05/06/2020     Medication review performed; medication  list updated in electronic medical record.  Counseled on importance of regular laboratory monitoring as prescribed; Reviewed role and benefits of statin for ASCVD risk reduction; Reviewed importance of limiting foods high in cholesterol; Reviewed exercise goals and target of 150 minutes per week;  Hypertension Interventions:  (Status:  Goal on track:  Yes.) Long Term Goal Last practice recorded BP readings:  BP Readings from Last 3 Encounters:  12/27/20 136/60  10/21/20 (!) 152/78  08/15/20 134/72  Most recent eGFR/CrCl: No results found for: EGFR  No components found for: CRCL  Reviewed medications with patient and discussed importance of compliance Counseled on the importance of exercise goals with target of 150 minutes per week Discussed plans with patient for ongoing care management follow up and provided patient with direct contact information for care management team Advised patient, providing education and rationale, to monitor blood pressure daily and record, calling PCP for findings outside established parameters  Adhere to low sodium diet  Patient Goals/Self-Care Activities: Take medications as prescribed   Attend all scheduled provider appointments Perform all self care activities independently  Perform IADL's (shopping, preparing meals, housekeeping, managing finances) independently Call provider office for new concerns or questions  check blood pressure 3 times per week write blood pressure results in a log or diary take blood pressure log to all doctor appointments call doctor for signs and symptoms of high blood pressure take medications for blood pressure exactly as prescribed report new symptoms to your doctor eat more whole grains, fruits and vegetables, lean meats and healthy fats - call for medicine refill 2 or 3 days before it runs out - call doctor with any symptoms you believe are related to your medicine Follow heart healthy diet, avoid trans/ saturated  fats Continue to eliminate caffeine from your diet       Plan:Telephone follow up appointment with care management team member scheduled for:  05/01/2021  Jacqlyn Larsen [facility], BSN RN Case Manager [facility] [phone]

## 2021-03-03 ENCOUNTER — Telehealth: Payer: Self-pay | Admitting: Internal Medicine

## 2021-03-03 MED ORDER — METOPROLOL TARTRATE 25 MG PO TABS: 25.0000 mg | ORAL_TABLET | Freq: Two times a day (BID) | ORAL | 1 refills | Status: DC

## 2021-03-03 NOTE — Telephone Encounter (Signed)
°*  STAT* If patient is at the pharmacy, call can be transferred to refill team.   1. Which medications need to be refilled? (please list name of each medication and dose if known) metoprolol tartrate (LOPRESSOR) 25 MG tablet  2. Which pharmacy/location (including street and city if local pharmacy) is medication to be sent to? [place], [place] [address]  3. Do they need a 30 day or 90 day supply? 90 day   Patient is out of medication

## 2021-03-03 NOTE — Telephone Encounter (Signed)
Complete

## 2021-03-08 ENCOUNTER — Telehealth: Payer: Self-pay | Admitting: Family Medicine

## 2021-03-08 NOTE — Telephone Encounter (Signed)
Left message for patient to call back and schedule Medicare Annual Wellness Visit (AWV) in office.   If not able to come in office, please offer to do virtually or by telephone.  Left office number and my jabber [phone].  Last AWV:12/28/2019  Please schedule at anytime with Nurse Health Advisor.

## 2021-03-23 ENCOUNTER — Other Ambulatory Visit: Payer: Self-pay

## 2021-03-23 ENCOUNTER — Ambulatory Visit (INDEPENDENT_AMBULATORY_CARE_PROVIDER_SITE_OTHER): Payer: Medicare Other

## 2021-03-23 VITALS — BP 132/70 | HR 73 | Ht 67.0 in | Wt 150.0 lb

## 2021-03-23 DIAGNOSIS — Z0000 Encounter for general adult medical examination without abnormal findings: Secondary | ICD-10-CM

## 2021-03-23 NOTE — Patient Instructions (Signed)
Lauren House , Thank you for taking time to come for your Medicare Wellness Visit. I appreciate your ongoing commitment to your health goals. Please review the following plan we discussed and let me know if I can assist you in the future.   Screening recommendations/referrals: Colonoscopy: No longer required.  Mammogram: Done 12/29/2020. Repeat annually  Bone Density: Done 11/15/2016 Repeat every 2 years  Recommended yearly ophthalmology/optometry visit for glaucoma screening and checkup Recommended yearly dental visit for hygiene and checkup  Vaccinations: Influenza vaccine: Done 11/30/2020 Repeat annually  Pneumococcal vaccine: Done 12/23/2012 and 11/02/2015 Tdap vaccine: Done 06/27/2003 Repeat in 10 years  Shingles vaccine: Shingrix discussed. Please contact your pharmacy for coverage information.     Covid-19:Done 04/23/2019 and 05/22/2019  Advanced directives: Please bring a copy of your health care power of attorney and living will to the office to be added to your chart at your convenience.   Conditions/risks identified: KEEP UP THE GOOD WORK!!  Next appointment: Follow up in one year for your annual wellness visit 2024.   Preventive Care 46 Years and Older, Female Preventive care refers to lifestyle choices and visits with your health care provider that can promote health and wellness. What does preventive care include? A yearly physical exam. This is also called an annual well check. Dental exams once or twice a year. Routine eye exams. Ask your health care provider how often you should have your eyes checked. Personal lifestyle choices, including: Daily care of your teeth and gums. Regular physical activity. Eating a healthy diet. Avoiding tobacco and drug use. Limiting alcohol use. Practicing safe sex. Taking low-dose aspirin every day. Taking vitamin and mineral supplements as recommended by your health care provider. What happens during an annual well check? The  services and screenings done by your health care provider during your annual well check will depend on your age, overall health, lifestyle risk factors, and family history of disease. Counseling  Your health care provider may ask you questions about your: Alcohol use. Tobacco use. Drug use. Emotional well-being. Home and relationship well-being. Sexual activity. Eating habits. History of falls. Memory and ability to understand (cognition). Work and work Statistician. Reproductive health. Screening  You may have the following tests or measurements: Height, weight, and BMI. Blood pressure. Lipid and cholesterol levels. These may be checked every 5 years, or more frequently if you are over 82 years old. Skin check. Lung cancer screening. You may have this screening every year starting at age 18 if you have a 30-pack-year history of smoking and currently smoke or have quit within the past 15 years. Fecal occult blood test (FOBT) of the stool. You may have this test every year starting at age 77. Flexible sigmoidoscopy or colonoscopy. You may have a sigmoidoscopy every 5 years or a colonoscopy every 10 years starting at age 27. Hepatitis C blood test. Hepatitis B blood test. Sexually transmitted disease (STD) testing. Diabetes screening. This is done by checking your blood sugar (glucose) after you have not eaten for a while (fasting). You may have this done every 1-3 years. Bone density scan. This is done to screen for osteoporosis. You may have this done starting at age 9. Mammogram. This may be done every 1-2 years. Talk to your health care provider about how often you should have regular mammograms. Talk with your health care provider about your test results, treatment options, and if necessary, the need for more tests. Vaccines  Your health care provider may recommend certain vaccines, such  as: Influenza vaccine. This is recommended every year. Tetanus, diphtheria, and acellular  pertussis (Tdap, Td) vaccine. You may need a Td booster every 10 years. Zoster vaccine. You may need this after age 86. Pneumococcal 13-valent conjugate (PCV13) vaccine. One dose is recommended after age 50. Pneumococcal polysaccharide (PPSV23) vaccine. One dose is recommended after age 40. Talk to your health care provider about which screenings and vaccines you need and how often you need them. This information is not intended to replace advice given to you by your health care provider. Make sure you discuss any questions you have with your health care provider. Document Released: 03/11/2015 Document Revised: 11/02/2015 Document Reviewed: 12/14/2014 Elsevier Interactive Patient Education  2017 [place] Prevention in the Home Falls can cause injuries. They can happen to people of all ages. There are many things you can do to make your home safe and to help prevent falls. What can I do on the outside of my home? Regularly fix the edges of walkways and driveways and fix any cracks. Remove anything that might make you trip as you walk through a door, such as a raised step or threshold. Trim any bushes or trees on the path to your home. Use bright outdoor lighting. Clear any walking paths of anything that might make someone trip, such as rocks or tools. Regularly check to see if handrails are loose or broken. Make sure that both sides of any steps have handrails. Any raised decks and porches should have guardrails on the edges. Have any leaves, snow, or ice cleared regularly. Use sand or salt on walking paths during winter. Clean up any spills in your garage right away. This includes oil or grease spills. What can I do in the bathroom? Use night lights. Install grab bars by the toilet and in the tub and shower. Do not use towel bars as grab bars. Use non-skid mats or decals in the tub or shower. If you need to sit down in the shower, use a plastic, non-slip stool. Keep the floor  dry. Clean up any water that spills on the floor as soon as it happens. Remove soap buildup in the tub or shower regularly. Attach bath mats securely with double-sided non-slip rug tape. Do not have throw rugs and other things on the floor that can make you trip. What can I do in the bedroom? Use night lights. Make sure that you have a light by your bed that is easy to reach. Do not use any sheets or blankets that are too big for your bed. They should not hang down onto the floor. Have a firm chair that has side arms. You can use this for support while you get dressed. Do not have throw rugs and other things on the floor that can make you trip. What can I do in the kitchen? Clean up any spills right away. Avoid walking on wet floors. Keep items that you use a lot in easy-to-reach places. If you need to reach something above you, use a strong step stool that has a grab bar. Keep electrical cords out of the way. Do not use floor polish or wax that makes floors slippery. If you must use wax, use non-skid floor wax. Do not have throw rugs and other things on the floor that can make you trip. What can I do with my stairs? Do not leave any items on the stairs. Make sure that there are handrails on both sides of the stairs and use  them. Fix handrails that are broken or loose. Make sure that handrails are as long as the stairways. Check any carpeting to make sure that it is firmly attached to the stairs. Fix any carpet that is loose or worn. Avoid having throw rugs at the top or bottom of the stairs. If you do have throw rugs, attach them to the floor with carpet tape. Make sure that you have a light switch at the top of the stairs and the bottom of the stairs. If you do not have them, ask someone to add them for you. What else can I do to help prevent falls? Wear shoes that: Do not have high heels. Have rubber bottoms. Are comfortable and fit you well. Are closed at the toe. Do not wear  sandals. If you use a stepladder: Make sure that it is fully opened. Do not climb a closed stepladder. Make sure that both sides of the stepladder are locked into place. Ask someone to hold it for you, if possible. Clearly mark and make sure that you can see: Any grab bars or handrails. First and last steps. Where the edge of each step is. Use tools that help you move around (mobility aids) if they are needed. These include: Canes. Walkers. Scooters. Crutches. Turn on the lights when you go into a dark area. Replace any light bulbs as soon as they burn out. Set up your furniture so you have a clear path. Avoid moving your furniture around. If any of your floors are uneven, fix them. If there are any pets around you, be aware of where they are. Review your medicines with your doctor. Some medicines can make you feel dizzy. This can increase your chance of falling. Ask your doctor what other things that you can do to help prevent falls. This information is not intended to replace advice given to you by your health care provider. Make sure you discuss any questions you have with your health care provider. Document Released: 12/09/2008 Document Revised: 07/21/2015 Document Reviewed: 03/19/2014 Elsevier Interactive Patient Education  2017 [employer].

## 2021-03-23 NOTE — Progress Notes (Signed)
Subjective:   Lauren House is a 86 y.o. female who presents for Medicare Annual (Subsequent) preventive examination.  Review of Systems     Cardiac Risk Factors include: advanced age (>49men, >11 women);dyslipidemia;hypertension  IN OFFICE VISIT AT [facility].    Objective:    Today's Vitals   03/23/21 1442 03/23/21 1448  BP: 132/70   Pulse: 73   SpO2: 97%   Weight: 150 lb (68 kg)   Height: 5\' 7"  (1.702 m)   PainSc:  0-No pain   Body mass index is 23.49 kg/m.  Advanced Directives 03/23/2021 10/03/2020 12/28/2019 03/17/2019 01/16/2018 01/11/2017 07/25/2016  Does Patient Have a Medical Advance Directive? Yes Yes Yes No Yes Yes No  Type of Advance Directive Healthcare Power of Attorney Living will;Healthcare Power of [place];Living will - Healthcare Power of [place] -  Does patient want to make changes to medical advance directive? - No - Patient declined - - No - Patient declined - -  Copy of [place] in Chart? No - copy requested No - copy requested - - No - copy requested No - copy requested -  Would patient like information on creating a medical advance directive? - - - - - - No - Patient declined  Pre-existing out of facility DNR order (yellow form or pink MOST form) - - - - - - -    Current Medications (verified) Outpatient Encounter Medications as of 03/23/2021  Medication Sig   alendronate (FOSAMAX) 70 MG tablet Take 1 tablet (70 mg total) by mouth every 7 (seven) days. Take with a full glass of water on an empty stomach.   aspirin EC 81 MG tablet Take 81 mg by mouth daily.   Butalbital-APAP-Caffeine 50-300-40 MG CAPS Take 1 tablet by mouth every 6 (six) hours as needed (migraine).   Cyanocobalamin (VITAMIN B12 PO) Take 1 tablet by mouth daily.   Evolocumab (REPATHA SURECLICK) 267 MG/ML SOAJ Inject 1 Dose into the skin every 14 (fourteen) days.   fluticasone (FLONASE) 50 MCG/ACT nasal spray USE TWO  SPRAY IN EACH NOSTRIL EVERY DAY   hydrocortisone cream 1 % Apply 1 application topically 2 (two) times daily.   metoprolol tartrate (LOPRESSOR) 25 MG tablet Take 1 tablet (25 mg total) by mouth 2 (two) times daily.   Multiple Vitamins-Minerals (OCUVITE PO) Take 1 tablet by mouth daily.   nitroGLYCERIN (NITROSTAT) 0.4 MG SL tablet Place 1 tablet (0.4 mg total) under the tongue every 5 (five) minutes as needed for chest pain.   Polyethyl Glycol-Propyl Glycol (SYSTANE OP) Apply 1 drop to eye 3 (three) times daily as needed (Dry eyes).   ranolazine (RANEXA) 500 MG 12 hr tablet Take 1 tablet (500 mg total) by mouth 2 (two) times daily.   senna-docusate (SENOKOT-S) 8.6-50 MG tablet Take 1 tablet by mouth at bedtime as needed for mild constipation or moderate constipation.   No facility-administered encounter medications on file as of 03/23/2021.    Allergies (verified) Chlorhexidine, Vancomycin, Aspirin, Atorvastatin, Bactrim [sulfamethoxazole-trimethoprim], Cephalosporins, Ciprofloxacin, Clarithromycin, Colestipol hcl, Ezetimibe, Prednisone, Simvastatin, Penicillins, and Tape   History: Past Medical History:  Diagnosis Date   Anxiety    Apical variant hypertrophic cardiomyopathy (HCC)    Arthritis    Borderline diabetes mellitus    Bruises easily    Bursitis of left hip    Coronary atherosclerosis of native coronary artery    a. 2011: Cath showing nonobstructive CAD - 50-60% LAD (normal FFR)  b. 06/2015: Cath showing 80% stenosis in the Mid LAD, tx w/ Promus DES 2.25 mm x 16 mm    Essential hypertension, benign    History of kidney stones    History of skin cancer    Basal cell - abdomen   Hyperlipidemia    Diet controlled.cannot take meds   Nocturia    Osteoporosis    Overactive bladder    Shortness of breath dyspnea    Vertigo    Past Surgical History:  Procedure Laterality Date   ABDOMINAL HYSTERECTOMY     CARDIAC CATHETERIZATION N/A 07/22/2015   Procedure: Left Heart Cath and  Coronary Angiography;  Surgeon: Leonie Man, MD;  Location: [facility];  Service: Cardiovascular;  Laterality: N/A;   CARDIAC CATHETERIZATION N/A 07/22/2015   Procedure: Coronary Stent Intervention;  Surgeon: Leonie Man, MD;  Location: [facility];  Service: Cardiovascular;  Laterality: N/A;   CORONARY STENT PLACEMENT  07/22/2015   mid lad  des    EYE SURGERY     cataracts bilateral   HEMORRHOID SURGERY     INGUINAL HERNIA REPAIR  10/11/2011   Procedure: HERNIA REPAIR INGUINAL ADULT;  Surgeon: Stark Klein, MD;  Location: [facility];  Service: General;  Laterality: Right;   OPEN SURGICAL REPAIR OF GLUTEAL TENDON Left 03/12/2014   Procedure: LEFT HIP BURSECTOMY AND GLUTEAL TENDON REPAIR;  Surgeon: [name], MD;  Location: [facility];  Service: Orthopedics;  Laterality: Left;   Family History  Problem Relation Age of Onset   Heart disease Father    Lung cancer Sister    Lung cancer Brother    Social History   Socioeconomic History   Marital status: Widowed    Spouse name: Not on file   Number of children: Not on file   Years of education: Not on file   Highest education level: Not on file  Occupational History   Not on file  Tobacco Use   Smoking status: Never   Smokeless tobacco: Never  Vaping Use   Vaping Use: Never used  Substance and Sexual Activity   Alcohol use: No   Drug use: No   Sexual activity: Never  Other Topics Concern   Not on file  Social History Narrative   Not on file   Social Determinants of Health   Financial Resource Strain: Low Risk    Difficulty of Paying Living Expenses: Not very hard  Food Insecurity: No Food Insecurity   Worried About Running Out of Food in the Last Year: Never true   [place] in the Last Year: Never true  Transportation Needs: No Transportation Needs   Lack of Transportation (Medical): No   Lack of Transportation (Non-Medical): No  Physical Activity: Sufficiently Active   Days of Exercise per  Week: 4 days   Minutes of Exercise per Session: 60 min  Stress: No Stress Concern Present   Feeling of Stress : Not at all  Social Connections: Moderately Integrated   Frequency of Communication with Friends and Family: More than three times a week   Frequency of Social Gatherings with Friends and Family: More than three times a week   Attends Religious Services: More than 4 times per year   Active Member of [employer] or Organizations: Yes   Attends Archivist Meetings: More than 4 times per year   Marital Status: Widowed    Tobacco Counseling Counseling given: Not Answered   Clinical Intake:  Pre-visit preparation  completed: Yes  Pain : No/denies pain Pain Score: 0-No pain     BMI - recorded: 23.49 Nutritional Status: BMI of 19-24  Normal Nutritional Risks: None Diabetes: No  How often do you need to have someone help you when you read instructions, pamphlets, or other written materials from your doctor or pharmacy?: 1 - Never  Diabetic?NO  Interpreter Needed?: No  Information entered by :: mj Warner [place],lpn   Activities of Daily Living In your present state of health, do you have any difficulty performing the following activities: 03/23/2021  Hearing? N  Vision? N  Difficulty concentrating or making decisions? N  Walking or climbing stairs? N  Dressing or bathing? N  Doing errands, shopping? N  Preparing Food and eating ? N  Using the Toilet? N  In the past six months, have you accidently leaked urine? Y  Do you have problems with loss of bowel control? N  Managing your Medications? N  Managing your Finances? N  Housekeeping or managing your Housekeeping? N  Some recent data might be hidden    Patient Care Team: Susy Frizzle, MD as PCP - General (Family Medicine) Dennard Schaumann Cammie Mcgee, MD (Family Medicine) Kassie Mends, RN as [place] any recent [place] you may have received from other than [facility]  providers in the past year (date may be approximate).     Assessment:   This is a routine wellness examination for Lauren House.  Hearing/Vision screen Hearing Screening - Comments:: No hearing issues.  Vision Screening - Comments:: Glasses. Dr. Jorja Loa. 2022.  Dietary issues and exercise activities discussed: Current Exercise Habits: Home exercise routine;Structured exercise class, Type of exercise: walking;stretching, Time (Minutes): 45, Frequency (Times/Week): 4, Weekly Exercise (Minutes/Week): 180, Intensity: Mild   Goals Addressed             This Visit's Progress    Exercise 3x per week (30 min per time)       Continue to exercise and stay healthy.       Depression Screen PHQ 2/9 Scores 03/23/2021 10/03/2020 10/03/2020 12/28/2019 12/03/2017 01/28/2017 11/05/2016  PHQ - 2 Score 0 0 0 0 0 1 0  PHQ- 9 Score - - - - - - -  Exception Documentation - - - - - - -    Fall Risk Fall Risk  03/23/2021 10/03/2020 12/28/2019 12/03/2017 01/28/2017  Falls in the past year? 0 0 0 No Yes  Number falls in past yr: 0 - 0 - 1  Injury with Fall? 0 - 0 - -  Risk for fall due to : No Fall Risks - - - -  Risk for fall due to: Comment - - - - -  Follow up Falls prevention discussed - - - Falls evaluation completed    FALL RISK PREVENTION PERTAINING TO THE HOME:  Any stairs in or around the home? Yes  If so, are there any without handrails? No  Home free of loose throw rugs in walkways, pet beds, electrical cords, etc? Yes  Adequate lighting in your home to reduce risk of falls? Yes   ASSISTIVE DEVICES UTILIZED TO PREVENT FALLS:  Life alert? No  Use of a cane, walker or w/c? No  Grab bars in the bathroom? Yes  Shower chair or bench in shower? No  Elevated toilet seat or a handicapped toilet? No   TIMED UP AND GO:  Was the test performed? Yes .  Length of time to ambulate 10 feet: 10  sec.   Gait steady and fast without use of assistive device  Cognitive Function:     6CIT Screen 03/23/2021  12/28/2019  What Year? 0 points 0 points  What month? 0 points 0 points  What time? 0 points 0 points  Count back from 20 0 points 0 points  Months in reverse 0 points 0 points  Repeat phrase 2 points 0 points  Total Score 2 0    Immunizations Immunization History  Administered Date(s) Administered   Fluad Quad(high Dose 65+) 12/23/2018, 12/28/2019, 11/30/2020   Influenza Whole 12/25/2005, 12/26/2006   Influenza, High Dose Seasonal PF 12/03/2017   Influenza,inj,Quad PF,6+ Mos 11/27/2012, 12/08/2013, 11/18/2014, 11/01/2015, 11/05/2016   Influenza,inj,quad, With Preservative 12/12/2017   Moderna Sars-Covid-2 Vaccination 04/23/2019, 05/22/2019   Pneumococcal Conjugate-13 11/02/2015   Pneumococcal Polysaccharide-23 12/23/2012   Td 06/27/2003    TDAP status: Due, Education has been provided regarding the importance of this vaccine. Advised may receive this vaccine at local pharmacy or Health Dept. Aware to provide a copy of the vaccination record if obtained from local pharmacy or Health Dept. Verbalized acceptance and understanding.  Flu Vaccine status: Up to date  Pneumococcal vaccine status: Up to date  Covid-19 vaccine status: Completed vaccines  Qualifies for Shingles Vaccine? Yes   Zostavax completed Yes   Shingrix Completed?: No.    Education has been provided regarding the importance of this vaccine. Patient has been advised to call insurance company to determine out of pocket expense if they have not yet received this vaccine. Advised may also receive vaccine at local pharmacy or Health Dept. Verbalized acceptance and understanding.  Screening Tests Health Maintenance  Topic Date Due   Zoster Vaccines- Shingrix (1 of 2) Never done   COVID-19 Vaccine (3 - Moderna risk series) 06/19/2019   TETANUS/TDAP  09/23/2021 (Originally 06/26/2013)   Pneumonia Vaccine 62+ Years old  Completed   INFLUENZA VACCINE  Completed   DEXA SCAN  Completed   HPV VACCINES  Aged Out    Health  Maintenance  Health Maintenance Due  Topic Date Due   Zoster Vaccines- Shingrix (1 of 2) Never done   COVID-19 Vaccine (3 - Moderna risk series) 06/19/2019    Colorectal cancer screening: No longer required.   Mammogram status: Completed 12/29/2020. Repeat every year  Bone Density status: Completed 11/15/2016. Results reflect: Bone density results: OSTEOPOROSIS. Repeat every 2 years.  Lung Cancer Screening: (Low Dose CT Chest recommended if Age 43-80 years, 30 pack-year currently smoking OR have quit w/in 15years.) does not qualify.    Additional Screening:  Hepatitis C Screening: does not qualify;   Vision Screening: Recommended annual ophthalmology exams for early detection of glaucoma and other disorders of the eye. Is the patient up to date with their annual eye exam?  Yes  Who is the provider or what is the name of the office in which the patient attends annual eye exams? Dr. Jorja Loa in [place]. If pt is not established with a provider, would they like to be referred to a provider to establish care? No .   Dental Screening: Recommended annual dental exams for proper oral hygiene  Community Resource Referral / Chronic Care Management: CRR required this visit?  No   CCM required this visit?  No      Plan:     I have personally reviewed and noted the following in the patients chart:   Medical and social history Use of alcohol, tobacco or illicit drugs  Current medications and supplements  including opioid prescriptions.  Functional ability and status Nutritional status Physical activity Advanced directives List of other physicians Hospitalizations, surgeries, and ER visits in previous 12 months Vitals Screenings to include cognitive, depression, and falls Referrals and appointments  In addition, I have reviewed and discussed with patient certain preventive protocols, quality metrics, and best practice recommendations. A written personalized care plan for  preventive services as well as general preventive health recommendations were provided to patient.     Chriss Driver, LPN   [id number]/[id number]   Nurse Notes: Pt is up to date on health maintenance. Declined repeat bone density. Discussed shingrix and tdap vaccines and how to obtain.

## 2021-05-01 ENCOUNTER — Telehealth: Payer: Medicare Other

## 2021-05-09 DIAGNOSIS — Z85828 Personal history of other malignant neoplasm of skin: Secondary | ICD-10-CM | POA: Diagnosis not present

## 2021-05-09 DIAGNOSIS — L821 Other seborrheic keratosis: Secondary | ICD-10-CM | POA: Diagnosis not present

## 2021-05-26 ENCOUNTER — Telehealth: Payer: Self-pay | Admitting: *Deleted

## 2021-05-26 NOTE — Chronic Care Management (AMB) (Signed)
?  Care Management  ? ?Note ? ?05/26/2021 ?Name: Lauren House MRN: [id number] DOB: [birth year] ? ?Lauren House is a 86 y.o. year old female who is a primary care patient of Dennard Schaumann, Cammie Mcgee, MD and is actively engaged with the care management team. I reached out to [name] by phone today to assist with re-scheduling a follow up visit with the RN Case Manager ? ?Follow up plan: ?Patient declines further follow up and engagement by the care management team. Appropriate care team members and provider have been notified via electronic communication. The care management team is available to follow up with the patient after provider conversation with the patient regarding recommendation for care management engagement and subsequent re-referral to the care management team.  ? ?[name]  ?Care Guide, Embedded Care Coordination ?[name]  Care Management  ?Direct Dial: [phone] ? ?

## 2021-06-01 ENCOUNTER — Ambulatory Visit: Payer: Self-pay | Admitting: *Deleted

## 2021-06-01 DIAGNOSIS — E782 Mixed hyperlipidemia: Secondary | ICD-10-CM

## 2021-06-01 DIAGNOSIS — I10 Essential (primary) hypertension: Secondary | ICD-10-CM

## 2021-06-01 NOTE — Chronic Care Management (AMB) (Signed)
? ?  06/01/2021 ? ?[name] ?28-Mar-1935 ?[id number] ? ? ? ?Care Management  ? ?Follow Up Note ? ? ?06/01/2021 ?Name: LANYIA JEWEL MRN: [id number] DOB: [birth year] ? ? ?Referred by: Susy Frizzle, MD ?Reason for referral : Chronic Care Management (HLD, HTN) ? ? ?Message received from care guide on 05/26/21 that patient requests discharge from [facility] program.  Care plan updated/ resolved.  Case closed. ? ?Jacqlyn Larsen RNC, BSN ?RN Case Manager ?[place] ?[phone] ? ? ?

## 2021-06-15 DIAGNOSIS — M79642 Pain in left hand: Secondary | ICD-10-CM | POA: Diagnosis not present

## 2021-06-15 DIAGNOSIS — M65342 Trigger finger, left ring finger: Secondary | ICD-10-CM | POA: Diagnosis not present

## 2021-07-28 ENCOUNTER — Ambulatory Visit (INDEPENDENT_AMBULATORY_CARE_PROVIDER_SITE_OTHER): Payer: Medicare Other | Admitting: Family Medicine

## 2021-07-28 ENCOUNTER — Ambulatory Visit (HOSPITAL_COMMUNITY)
Admission: RE | Admit: 2021-07-28 | Discharge: 2021-07-28 | Disposition: A | Discharge status: Home / Self Care | Payer: Medicare Other | Source: Ambulatory Visit | Attending: Family Medicine | Admitting: Family Medicine

## 2021-07-28 VITALS — BP 124/80 | HR 63 | Ht 67.0 in | Wt 157.0 lb

## 2021-07-28 DIAGNOSIS — W57XXXA Bitten or stung by nonvenomous insect and other nonvenomous arthropods, initial encounter: Secondary | ICD-10-CM | POA: Diagnosis not present

## 2021-07-28 DIAGNOSIS — M25561 Pain in right knee: Secondary | ICD-10-CM | POA: Diagnosis not present

## 2021-07-28 DIAGNOSIS — M85861 Other specified disorders of bone density and structure, right lower leg: Secondary | ICD-10-CM | POA: Diagnosis not present

## 2021-07-28 DIAGNOSIS — M1711 Unilateral primary osteoarthritis, right knee: Secondary | ICD-10-CM | POA: Diagnosis not present

## 2021-07-28 NOTE — Progress Notes (Signed)
Subjective:    Patient ID: Lauren House, female    DOB: 07/13/1935, 86 y.o.   MRN: [id number]  HPI 3 weeks ago, the patient was bitten by a tick on the medial aspect of her right knee.  There is still a small erythematous nodule in that area less than a centimeter in diameter.  Approximately 1 week ago, the patient states that her right knee began to ache and throb especially at night.  She has diminished range of motion in the right knee.  There is a mild effusion.  She has tenderness to palpation and with flexion and extension.  She has trouble getting up on the exam table due to the pain in her knee.  There is no erythema or warmth to suggest septic arthritis.  I do not believe that the tick bite had anything to do with it.  There is no evidence of erythema migrans and she denies any fevers or chills or myalgias or neck pain.  I suspect osteoarthritis in the knee Past Medical History:  Diagnosis Date   Anxiety    Apical variant hypertrophic cardiomyopathy ([place])    Arthritis    Borderline diabetes mellitus    Bruises easily    Bursitis of left hip    Coronary atherosclerosis of native coronary artery    a. 2011: Cath showing nonobstructive CAD - 50-60% LAD (normal FFR) b. 06/2015: Cath showing 80% stenosis in the Mid LAD, tx w/ Promus DES 2.25 mm x 16 mm    Essential hypertension, benign    History of kidney stones    History of skin cancer    Basal cell - abdomen   Hyperlipidemia    Diet controlled.cannot take meds   Nocturia    Osteoporosis    Overactive bladder    Shortness of breath dyspnea    Vertigo    Past Surgical History:  Procedure Laterality Date   ABDOMINAL HYSTERECTOMY     CARDIAC CATHETERIZATION N/A 07/22/2015   Procedure: Left Heart Cath and Coronary Angiography;  Surgeon: Leonie Man, MD;  Location: [place] [facility];  Service: Cardiovascular;  Laterality: N/A;   CARDIAC CATHETERIZATION N/A 07/22/2015   Procedure: Coronary Stent Intervention;  Surgeon: Leonie Man, MD;  Location: [facility];  Service: Cardiovascular;  Laterality: N/A;   CORONARY STENT PLACEMENT  07/22/2015   mid lad  des    EYE SURGERY     cataracts bilateral   HEMORRHOID SURGERY     INGUINAL HERNIA REPAIR  10/11/2011   Procedure: HERNIA REPAIR INGUINAL ADULT;  Surgeon: Stark Klein, MD;  Location: [place] [facility];  Service: General;  Laterality: Right;   OPEN SURGICAL REPAIR OF GLUTEAL TENDON Left 03/12/2014   Procedure: LEFT HIP BURSECTOMY AND GLUTEAL TENDON REPAIR;  Surgeon: [name], MD;  Location: [facility];  Service: Orthopedics;  Laterality: Left;    Allergies  Allergen Reactions   Chlorhexidine Itching   Vancomycin Hives   Aspirin Other (See Comments)    Nose bleeds with full strength   Atorvastatin Other (See Comments)    Muscle pain   Bactrim [Sulfamethoxazole-Trimethoprim] Other (See Comments)    Doesn't remember    Cephalosporins Hives and Itching   Ciprofloxacin Nausea Only   Clarithromycin Other (See Comments)    Doesn't remember    Colestipol Hcl Other (See Comments)    Hallucinations   Ezetimibe Other (See Comments)    GI upset   Prednisone    Simvastatin Other (See  Comments)    Muscle spasms   Penicillins Rash    Has patient had a PCN reaction causing immediate rash, facial/tongue/throat swelling, SOB or lightheadedness with hypotension: yes Has patient had a PCN reaction causing severe rash involving mucus membranes or skin necrosis: no Has patient had a PCN reaction that required hospitalization no Has patient had a PCN reaction occurring within the last 10 years: no If all of the above answers are "NO", then may proceed with Cephalosporin use.    Tape Rash   Social History   Socioeconomic History   Marital status: Widowed    Spouse name: Not on file   Number of children: 4   Years of education: Not on file   Highest education level: Not on file  Occupational History   Not on file  Tobacco Use   Smoking status: Never    Smokeless tobacco: Never  Vaping Use   Vaping Use: Never used  Substance and Sexual Activity   Alcohol use: No   Drug use: No   Sexual activity: Never  Other Topics Concern   Not on file  Social History Narrative   3 sons, 1 daughter. 1 son died in 2016/05/15 (unexpectedly)   Husband died in 05/16/2014.   6 grandchildren    70 great grandchildren   Social Determinants of Radio broadcast assistant Strain: Low Risk    Difficulty of Paying Living Expenses: Not very hard  Food Insecurity: No Food Insecurity   Worried About Charity fundraiser in the Last Year: Never true   Arboriculturist in the Last Year: Never true  Transportation Needs: No Transportation Needs   Lack of Transportation (Medical): No   Lack of Transportation (Non-Medical): No  Physical Activity: Sufficiently Active   Days of Exercise per Week: 4 days   Minutes of Exercise per Session: 60 min  Stress: No Stress Concern Present   Feeling of Stress : Not at all  Social Connections: Moderately Integrated   Frequency of Communication with Friends and Family: More than three times a week   Frequency of Social Gatherings with Friends and Family: More than three times a week   Attends Religious Services: More than 4 times per year   Active Member of [employer] or Organizations: Yes   Attends Archivist Meetings: More than 4 times per year   Marital Status: Widowed  Human resources officer Violence: Not At Risk   Fear of Current or Ex-Partner: No   Emotionally Abused: No   Physically Abused: No   Sexually Abused: No   Family History  Problem Relation Age of Onset   Heart disease Father    Lung cancer Sister    Lung cancer Brother       Review of Systems     Objective:   Physical Exam Vitals reviewed.  Constitutional:      General: She is not in acute distress.    Appearance: Normal appearance. She is normal weight. She is not ill-appearing, toxic-appearing or diaphoretic.  HENT:     Head: Normocephalic and  atraumatic.  Neck:     Vascular: No carotid bruit.  Cardiovascular:     Rate and Rhythm: Normal rate and regular rhythm.     Heart sounds: Normal heart sounds. No murmur heard.   No friction rub. No gallop.  Pulmonary:     Effort: Pulmonary effort is normal. No respiratory distress.     Breath sounds: No stridor. No wheezing or rhonchi.  Musculoskeletal:     Cervical back: Neck supple. No rigidity or tenderness.     Right knee: Effusion present. Decreased range of motion. Tenderness present over the medial joint line.  Lymphadenopathy:     Cervical: No cervical adenopathy.  Skin:    General: Skin is dry.  Neurological:     General: No focal deficit present.     Mental Status: She is alert and oriented to person, place, and time. Mental status is at baseline.     Cranial Nerves: No cranial nerve deficit.          Assessment & Plan:  Acute pain of right knee - Plan: DG Knee Complete 4 Views Right  Tick bite, unspecified site, initial encounter - Plan: B. burgdorfi antibodies by WB There is no evidence of septic arthritis.  I do not feel that the tick bite has anything to do with her knee pain but I will check Lyme titers.  She has no clinical symptoms of [facility] spotted fever.  I suspect that her knee pain is most likely due to osteoarthritis.  Obtain an x-ray of the right knee to evaluate further.  Using sterile technique, I injected the right knee with 2 cc lidocaine, 2 cc of Marcaine, and 2 cc of 40 mg/mL Kenalog.  Patient tolerated the procedure well without complication.

## 2021-07-30 ENCOUNTER — Other Ambulatory Visit: Payer: Self-pay

## 2021-07-30 ENCOUNTER — Encounter (HOSPITAL_COMMUNITY): Payer: Self-pay

## 2021-07-30 ENCOUNTER — Emergency Department (HOSPITAL_COMMUNITY)
Admission: EM | Admit: 2021-07-30 | Discharge: 2021-07-30 | Disposition: A | Discharge status: Home / Self Care | Payer: Medicare Other | Attending: Emergency Medicine | Admitting: Emergency Medicine

## 2021-07-30 ENCOUNTER — Emergency Department (HOSPITAL_COMMUNITY): Payer: Medicare Other

## 2021-07-30 DIAGNOSIS — I10 Essential (primary) hypertension: Secondary | ICD-10-CM | POA: Insufficient documentation

## 2021-07-30 DIAGNOSIS — M79651 Pain in right thigh: Secondary | ICD-10-CM | POA: Diagnosis not present

## 2021-07-30 DIAGNOSIS — Z79899 Other long term (current) drug therapy: Secondary | ICD-10-CM | POA: Diagnosis not present

## 2021-07-30 DIAGNOSIS — M25561 Pain in right knee: Secondary | ICD-10-CM | POA: Insufficient documentation

## 2021-07-30 DIAGNOSIS — M810 Age-related osteoporosis without current pathological fracture: Secondary | ICD-10-CM | POA: Diagnosis not present

## 2021-07-30 DIAGNOSIS — S8991XA Unspecified injury of right lower leg, initial encounter: Secondary | ICD-10-CM | POA: Diagnosis not present

## 2021-07-30 DIAGNOSIS — Z7982 Long term (current) use of aspirin: Secondary | ICD-10-CM | POA: Diagnosis not present

## 2021-07-30 MED ORDER — OXYCODONE-ACETAMINOPHEN 5-325 MG PO TABS: 1.0000 | ORAL_TABLET | Freq: Three times a day (TID) | ORAL | 0 refills | Status: AC | PRN

## 2021-07-30 MED ORDER — OXYCODONE-ACETAMINOPHEN 5-325 MG PO TABS
1.0000 | ORAL_TABLET | Freq: Once | ORAL | Status: AC
  Administered 2021-07-30: 1 via ORAL
  Filled 2021-07-30: qty 1

## 2021-07-30 NOTE — ED Provider Notes (Cosign Needed Addendum)
[facility] EMERGENCY DEPARTMENT Provider Note   CSN: [id number] Arrival date & time: 07/30/21  0818     History  Chief Complaint  Patient presents with   Knee Pain    Lauren House is a 86 y.o. female.  HPI  Medical history including hypertension, arthritis, cardiac catheterization in 2017 presents with complaints of right knee pain.  Patient states that she had a mechanical fall last week, states that she was in the garden and fell, she believes that she fell on her right knee.  She states she was unable to get up without difficulty, and did not go to the ER at that time.  States that since then she is having worsening knee pain pain is mainly at nighttime and  feels pain rating up into her right thigh, she denies any paresthesia or weakness moving down her legs, she is able to ambulate but it hurts when she does so.  She states been taking Tylenol without much relief.  She went to her primary care doctor yesterday and they said that her x-rays were normal.  She was given a joint injection but still is having pain.  She is not endorsing headaches change in vision paresthesias or weakness in the upper or lower extremities any neck pain back pain chest pain pain in the other 3 extremities.  Review patient's chart was seen by her primary care doctor x-rays were performed that shows osteoarthritis in the right knee without significant findings.  Home Medications Prior to Admission medications   Medication Sig Start Date End Date Taking? Authorizing Provider  oxyCODONE-acetaminophen (PERCOCET/ROXICET) 5-325 MG tablet Take 1 tablet by mouth every 8 (eight) hours as needed for up to 4 days for severe pain. 07/30/21 08/03/21 Yes Marcello Fennel, PA-C  alendronate (FOSAMAX) 70 MG tablet Take 1 tablet (70 mg total) by mouth every 7 (seven) days. Take with a full glass of water on an empty stomach. 12/28/19   Susy Frizzle, MD  aspirin EC 81 MG tablet Take 81 mg by mouth daily.    [provider]  Butalbital-APAP-Caffeine 50-300-40 MG CAPS Take 1 tablet by mouth every 6 (six) hours as needed (migraine). 08/15/20   Susy Frizzle, MD  Cyanocobalamin (VITAMIN B12 PO) Take 1 tablet by mouth daily.    [provider]  Evolocumab (REPATHA SURECLICK) 767 MG/ML SOAJ Inject 1 Dose into the skin every 14 (fourteen) days. Patient not taking: Reported on 07/28/2021 01/24/21   [name], MD  fluticasone [facility]) 50 MCG/ACT nasal spray USE TWO SPRAY IN [facility] NOSTRIL EVERY DAY 11/01/15   Susy Frizzle, MD  hydrocortisone cream 1 % Apply 1 application topically 2 (two) times daily. 08/31/16   Alycia Rossetti, MD  metoprolol tartrate (LOPRESSOR) 25 MG tablet Take 1 tablet (25 mg total) by mouth 2 (two) times daily. 03/03/21   Hilty, Nadean Corwin, MD  Multiple Vitamins-Minerals (OCUVITE PO) Take 1 tablet by mouth daily.    [provider]  nitroGLYCERIN (NITROSTAT) 0.4 MG SL tablet Place 1 tablet (0.4 mg total) under the tongue every 5 (five) minutes as needed for chest pain. 08/26/14   Hilty, Nadean Corwin, MD  Polyethyl Glycol-Propyl Glycol (SYSTANE OP) Apply 1 drop to eye 3 (three) times daily as needed (Dry eyes).    [provider]  ranolazine (RANEXA) 500 MG 12 hr tablet Take 1 tablet (500 mg total) by mouth 2 (two) times daily. 04/20/20   Hilty, Nadean Corwin, MD  senna-docusate (SENOKOT-S) 8.6-50 MG tablet Take 1 tablet by mouth at bedtime as needed for mild constipation or moderate constipation. 01/16/18   Long, Wonda Olds, MD      Allergies    Chlorhexidine, Vancomycin, Aspirin, Atorvastatin, Bactrim [sulfamethoxazole-trimethoprim], Cephalosporins, Ciprofloxacin, Clarithromycin, Colestipol hcl, Ezetimibe, Prednisone, Simvastatin, Penicillins, and Tape    Review of Systems   Review of Systems  Constitutional:  Negative for chills and fever.  Respiratory:  Negative for shortness of breath.   Cardiovascular:  Negative for chest pain.  Gastrointestinal:  Negative  for abdominal pain.  Musculoskeletal:        Right knee and thigh pain  Neurological:  Negative for headaches.   Physical Exam Updated Vital Signs BP 118/66   Pulse (!) 59   Temp 97.7 F (36.5 C) (Oral)   Resp 16   Ht '5\' 7"'$  (1.702 m)   Wt 66.7 kg   SpO2 99%   BMI 23.02 kg/m  Physical Exam Vitals and nursing note reviewed.  Constitutional:      General: She is not in acute distress.    Appearance: She is not ill-appearing.  HENT:     Head: Normocephalic and atraumatic.     Comments: No deformity of the head present no raccoon eyes or battle sign noted.    Nose: No congestion.     Mouth/Throat:     Comments: No trismus no torticollis no oral trauma. Eyes:     Conjunctiva/sclera: Conjunctivae normal.  Cardiovascular:     Rate and Rhythm: Normal rate and regular rhythm.     Pulses: Normal pulses.     Heart sounds: No murmur heard.   No friction rub. No gallop.  Pulmonary:     Effort: No respiratory distress.     Breath sounds: No wheezing, rhonchi or rales.     Comments: Chest is nontender my examination Abdominal:     Palpations: Abdomen is soft.     Tenderness: There is no abdominal tenderness.  Musculoskeletal:     Comments: Spine was palpated was nontender to palpation no step-off or deformities noted.  No pelvis instability.  Right knee was visualized there is no deformities noted no overlying skin changes, she had none focalized tenderness around the knee, she had no pain on the popliteal, no calf tenderness no palpable cords, she has full range her toes ankle and knee, there is no joint laxity, 2+ dorsal pedal pulses.  Skin:    General: Skin is warm and dry.  Neurological:     Mental Status: She is alert.  Psychiatric:        Mood and Affect: Mood normal.    ED Results / Procedures / Treatments   Labs (all labs ordered are listed, but only abnormal results are displayed) Labs Reviewed - No data to display  EKG None  Radiology CT Knee Right Wo  Contrast  Result Date: 07/30/2021 CLINICAL DATA:  Knee trauma, occult fracture suspected, xray done EXAM: CT OF THE RIGHT KNEE WITHOUT CONTRAST TECHNIQUE: Multidetector CT imaging of the right knee was performed according to the standard protocol. Multiplanar CT image reconstructions were also generated. RADIATION DOSE REDUCTION: This exam was performed according to the departmental dose-optimization program which includes automated exposure control, adjustment of the mA and/or kV according to patient size and/or use of iterative reconstruction technique. COMPARISON:  X-ray 07/28/2021 FINDINGS: Bones/Joint/Cartilage Diffuse osseous demineralization. No acute fracture. No malalignment. Joint spaces are relatively preserved with tiny marginal osteophytes. Degenerative subchondral cyst formation along the patellar  apex. Small enthesophyte at the quadriceps tendon insertion. No knee joint effusion or hemarthrosis. Tiny Baker's cyst. Ligaments Suboptimally assessed by CT. Muscles and Tendons Musculotendinous structures appear within normal limits by CT. Soft tissues Scattered atherosclerotic vascular calcifications. No soft tissue swelling or hematoma. IMPRESSION: No acute osseous abnormality of the right knee. Electronically Signed   By: Davina Poke D.O.   On: 07/30/2021 10:44   DG Femur Min 2 Views Right  Result Date: 07/30/2021 CLINICAL DATA:  Distal femur pain.  Fall EXAM: RIGHT FEMUR 2 VIEWS COMPARISON:  07/28/2021 FINDINGS: Osseous demineralization. There is no evidence of fracture or other focal bone lesions. No dislocation. No focal soft tissue swelling. Atherosclerotic vascular calcifications. IMPRESSION: Negative. Electronically Signed   By: Davina Poke D.O.   On: 07/30/2021 10:51    Procedures Procedures    Medications Ordered in ED Medications  oxyCODONE-acetaminophen (PERCOCET/ROXICET) 5-325 MG per tablet 1 tablet (1 tablet Oral Given 07/30/21 1039)    ED Course/ Medical Decision  Making/ A&P                           Medical Decision Making Amount and/or Complexity of Data Reviewed Radiology: ordered.  Risk Prescription drug management.   This patient presents to the ED for concern of right knee pain, this involves an extensive number of treatment options, and is a complaint that carries with it a high risk of complications and morbidity.  The differential diagnosis includes fracture, dislocation, compartment syndrome DVT    Additional history obtained:  Additional history obtained from daughter at bedside External records from outside source obtained and reviewed including previous PCP notes, imaging   Co morbidities that complicate the patient evaluation  Arthritis  Social Determinants of Health:  Geriatric    Lab Tests:  I Ordered, and personally interpreted labs.  The pertinent results include: N/A   Imaging Studies ordered:  I ordered imaging studies including x-ray of right femur CT of right knee I independently visualized and interpreted imaging which showed both of which are negative for acute findings. I agree with the radiologist interpretation   Cardiac Monitoring:  The patient was maintained on a cardiac monitor.  I personally viewed and interpreted the cardiac monitored which showed an underlying rhythm of: N/A   Medicines ordered and prescription drug management:  I ordered medication including oxycodone for pain I have reviewed the patients home medicines and have made adjustments as needed  Critical Interventions:  N/A   Reevaluation:  Presents with right knee pain, had imaging performed yesterday which were unremarkable, I am concerned for possible occult fracture will obtain CT imaging for further evaluation, will also add on DG femur for rule out of femur fracture.  Patient was reassessed after oxycodone she is ambulate without difficulty she is ready for discharge.  Consultations Obtained:  N/a    Test  Considered:  DVT study-defer to my suspicion for DVT is very low at this time no calf tenderness no palpable cords present is more consistent with skeletal etiology.    Rule out I have low suspicion for septic arthritis as patient denies IV drug use, skin exam was performed no erythematous, edematous, warm joints noted on exam, no new heart murmur heard on exam.  Low suspicion for fracture or dislocation as x-ray as well as CT imaging does not feel any significant findings. low suspicion for ligament or tendon damage as area was palpated no gross defects noted, they had  full range of motion as well as 5/5 strength.  Low suspicion for compartment syndrome as area was palpated it was soft to the touch, neurovascular fully intact.     Dispostion and problem list  After consideration of the diagnostic results and the patients response to treatment, I feel that the patent would benefit from discharge.  Right knee pain-likely acute exacerbation of her chronic right knee pain, will start her on a short course of narcotic medication, follow-up with Ortho for further evaluation and strict return precautions.  I am deferring on oral steroids at this time as patient recently had a joint injection without much relief I doubt systemic steroids will help alleviate the pain.            Final Clinical Impression(s) / ED Diagnoses Final diagnoses:  Acute pain of right knee    Rx / DC Orders ED Discharge Orders          Ordered    oxyCODONE-acetaminophen (PERCOCET/ROXICET) 5-325 MG tablet  Every 8 hours PRN        07/30/21 1121              Marcello Fennel, PA-C 07/30/21 1123    Marcello Fennel, PA-C 07/30/21 1125    Milton Ferguson, MD 08/01/21 [phone]

## 2021-07-30 NOTE — Discharge Instructions (Addendum)
Right knee pain this is likely an exacerbation of your arthritis in the right knee, I have given you a short course of pain medication please take as prescribed.  May apply warm compresses to the area and or ice as well to decrease pain and inflammation.  I have given you a short course of narcotics please take as prescribed.  This medication can make you drowsy do not consume alcohol or operate heavy machinery when taking this medication.  This medication is Tylenol in it do not take Tylenol and take this medication.  Please follow-up with orthopedics for further evaluation.  Come back to the emergency department if you develop chest pain, shortness of breath, severe abdominal pain, uncontrolled nausea, vomiting, diarrhea.

## 2021-07-30 NOTE — ED Triage Notes (Signed)
Patient arrived with complaints of right knee and upper thigh pain following a fall last week. Patient states she has been unable to sleep due to the pain and that she received a cortisone shot without relief.

## 2021-08-01 ENCOUNTER — Ambulatory Visit: Payer: Self-pay

## 2021-08-01 NOTE — Telephone Encounter (Signed)
  Chief Complaint: Leg pain Symptoms: 10/10 leg pain Frequency: ongoing Pertinent Negatives: Patient denies  Disposition: '[]'$ ED /'[]'$ Urgent Care (no appt availability in office) / '[]'$ Appointment(In office/virtual)/ '[]'$  [place] [facility]/ '[]'$ Home Care/ '[]'$ Refused Recommended Disposition /'[]'$ [place] [facility]/ '[x]'$  Follow-up with PCP Additional Notes: Pt's daughter called regarding ongoing leg pain. Pt is still in extreme pain. They would like a referral to an arthritis specialist for further evaluation. Please return daughter's call.    Summary: severe pain in upper right thigh      Pt's daughter called in stating that pt is still in severe pain in her upper right thigh. Pt was recently seen in [place] and was prescribed pain meds. Pt's daughter states that the pain meds are not helping with pain. Pt's daughter stated that she has taken her to the er over the weekend and doesn't know what else she should do. Please advise.      Reason for Disposition  [1] SEVERE pain (e.g., excruciating, unable to do any normal activities) AND [2] not improved after 2 hours of pain medicine  Answer Assessment - Initial Assessment Questions 1. ONSET: "When did the pain start?"       2. LOCATION: "Where is the pain located?"      Right upper thigh 3. PAIN: "How bad is the pain?"    (Scale 1-10; or mild, moderate, severe)   -  MILD (1-3): doesn't interfere with normal activities    -  MODERATE (4-7): interferes with normal activities (e.g., work or school) or awakens from sleep, limping    -  SEVERE (8-10): excruciating pain, unable to do any normal activities, unable to walk     10/10 4. WORK OR EXERCISE: "Has there been any recent work or exercise that involved this part of the body?"       5. CAUSE: "What do you think is causing the leg pain?"     arthritis 6. OTHER SYMPTOMS: "Do you have any other symptoms?" (e.g., chest pain, back pain, breathing difficulty, swelling, rash, fever, numbness,  weakness)      7. PREGNANCY: "Is there any chance you are pregnant?" "When was your last menstrual period?"     na  Protocols used: Leg Pain-A-AH

## 2021-08-02 LAB — B. BURGDORFI ANTIBODIES BY WB
B burgdorferi IgG Abs (IB): NEGATIVE
B burgdorferi IgM Abs (IB): NEGATIVE
Lyme Disease 18 kD IgG: NONREACTIVE
Lyme Disease 23 kD IgG: NONREACTIVE
Lyme Disease 23 kD IgM: NONREACTIVE
Lyme Disease 28 kD IgG: NONREACTIVE
Lyme Disease 30 kD IgG: NONREACTIVE
Lyme Disease 39 kD IgG: REACTIVE — AB
Lyme Disease 39 kD IgM: NONREACTIVE
Lyme Disease 41 kD IgG: REACTIVE — AB
Lyme Disease 41 kD IgM: NONREACTIVE
Lyme Disease 45 kD IgG: NONREACTIVE
Lyme Disease 58 kD IgG: REACTIVE — AB
Lyme Disease 66 kD IgG: NONREACTIVE
Lyme Disease 93 kD IgG: REACTIVE — AB

## 2021-08-10 ENCOUNTER — Ambulatory Visit: Payer: Medicare Other | Admitting: Family Medicine

## 2021-08-10 VITALS — BP 120/72 | HR 78 | Temp 97.9°F | Ht 67.0 in | Wt 143.0 lb

## 2021-08-10 DIAGNOSIS — A6923 Arthritis due to Lyme disease: Secondary | ICD-10-CM | POA: Diagnosis not present

## 2021-08-10 DIAGNOSIS — M25561 Pain in right knee: Secondary | ICD-10-CM

## 2021-08-10 MED ORDER — DOXYCYCLINE HYCLATE 100 MG PO TABS: 100.0000 mg | ORAL_TABLET | Freq: Two times a day (BID) | ORAL | 0 refills | Status: DC

## 2021-08-10 NOTE — Progress Notes (Signed)
Subjective:    Patient ID: Lauren House, female    DOB: May 01, 1935, 86 y.o.   MRN: [id number]  HPI 07/28/21 3 weeks ago, the patient was bitten by a tick on the medial aspect of her right knee.  There is still a small erythematous nodule in that area less than a centimeter in diameter.  Approximately 1 week ago, the patient states that her right knee began to ache and throb especially at night.  She has diminished range of motion in the right knee.  There is a mild effusion.  She has tenderness to palpation and with flexion and extension.  She has trouble getting up on the exam table due to the pain in her knee.  There is no erythema or warmth to suggest septic arthritis.  I do not believe that the tick bite had anything to do with it.  There is no evidence of erythema migrans and she denies any fevers or chills or myalgias or neck pain.  I suspect osteoarthritis in the knee.  At that time, my plan was:  There is no evidence of septic arthritis.  I do not feel that the tick bite has anything to do with her knee pain but I will check Lyme titers.  She has no clinical symptoms of [facility] spotted fever.  I suspect that her knee pain is most likely due to osteoarthritis.  Obtain an x-ray of the right knee to evaluate further.  Using sterile technique, I injected the right knee with 2 cc lidocaine, 2 cc of Marcaine, and 2 cc of 40 mg/mL Kenalog.  Patient tolerated the procedure well without complication.  08/10/21 Went to ER 6/4 due to worsening pain.  There had CT scan:  FINDINGS: Bones/Joint/Cartilage   Diffuse osseous demineralization. No acute fracture. No malalignment. Joint spaces are relatively preserved with tiny marginal osteophytes. Degenerative subchondral cyst formation along the patellar apex. Small enthesophyte at the quadriceps tendon insertion. No knee joint effusion or hemarthrosis. Tiny Baker's cyst.   Ligaments   Suboptimally assessed by CT.   Muscles and Tendons    Musculotendinous structures appear within normal limits by CT.   Soft tissues   Scattered atherosclerotic vascular calcifications. No soft tissue swelling or hematoma.   Recent labs revealed 4/10 IgG for lyme disease (5/10 = positive result).  But IgM was negative.  Here to discuss results along with knee pain.   Patient continues to have significant throbbing right knee pain.  She denies any Bell's palsy.  She denies any radiculopathy or nerve pain anywhere in the body.  She denies any chest pain or signs of carditis.  She denies any arrhythmias. Past Medical History:  Diagnosis Date   Anxiety    Apical variant hypertrophic cardiomyopathy ([place])    Arthritis    Borderline diabetes mellitus    Bruises easily    Bursitis of left hip    Coronary atherosclerosis of native coronary artery    a. 2011: Cath showing nonobstructive CAD - 50-60% LAD (normal FFR) b. 06/2015: Cath showing 80% stenosis in the Mid LAD, tx w/ Promus DES 2.25 mm x 16 mm    Essential hypertension, benign    History of kidney stones    History of skin cancer    Basal cell - abdomen   Hyperlipidemia    Diet controlled.cannot take meds   Nocturia    Osteoporosis    Overactive bladder    Shortness of breath dyspnea    Vertigo  Past Surgical History:  Procedure Laterality Date   ABDOMINAL HYSTERECTOMY     CARDIAC CATHETERIZATION N/A 07/22/2015   Procedure: Left Heart Cath and Coronary Angiography;  Surgeon: Leonie Man, MD;  Location: [facility];  Service: Cardiovascular;  Laterality: N/A;   CARDIAC CATHETERIZATION N/A 07/22/2015   Procedure: Coronary Stent Intervention;  Surgeon: Leonie Man, MD;  Location: [facility];  Service: Cardiovascular;  Laterality: N/A;   CORONARY STENT PLACEMENT  07/22/2015   mid lad  des    EYE SURGERY     cataracts bilateral   HEMORRHOID SURGERY     INGUINAL HERNIA REPAIR  10/11/2011   Procedure: HERNIA REPAIR INGUINAL ADULT;  Surgeon: Stark Klein, MD;   Location: [facility];  Service: General;  Laterality: Right;   OPEN SURGICAL REPAIR OF GLUTEAL TENDON Left 03/12/2014   Procedure: LEFT HIP BURSECTOMY AND GLUTEAL TENDON REPAIR;  Surgeon: [name], MD;  Location: [facility];  Service: Orthopedics;  Laterality: Left;    Allergies  Allergen Reactions   Chlorhexidine Itching   Vancomycin Hives   Aspirin Other (See Comments)    Nose bleeds with full strength   Atorvastatin Other (See Comments)    Muscle pain   Bactrim [Sulfamethoxazole-Trimethoprim] Other (See Comments)    Doesn't remember    Cephalosporins Hives and Itching   Ciprofloxacin Nausea Only   Clarithromycin Other (See Comments)    Doesn't remember    Colestipol Hcl Other (See Comments)    Hallucinations   Ezetimibe Other (See Comments)    GI upset   Prednisone    Simvastatin Other (See Comments)    Muscle spasms   Penicillins Rash    Has patient had a PCN reaction causing immediate rash, facial/tongue/throat swelling, SOB or lightheadedness with hypotension: yes Has patient had a PCN reaction causing severe rash involving mucus membranes or skin necrosis: no Has patient had a PCN reaction that required hospitalization no Has patient had a PCN reaction occurring within the last 10 years: no If all of the above answers are "NO", then may proceed with Cephalosporin use.    Tape Rash   Social History   Socioeconomic History   Marital status: Widowed    Spouse name: Not on file   Number of children: 4   Years of education: Not on file   Highest education level: Not on file  Occupational History   Not on file  Tobacco Use   Smoking status: Never   Smokeless tobacco: Never  Vaping Use   Vaping Use: Never used  Substance and Sexual Activity   Alcohol use: No   Drug use: No   Sexual activity: Never  Other Topics Concern   Not on file  Social History Narrative   3 sons, 1 daughter. 1 son died in 2016-06-04 (unexpectedly)   Husband died in 2014-06-05.   6 grandchildren    28  great grandchildren   Social Determinants of Health   Financial Resource Strain: Low Risk  (03/23/2021)   Overall Financial Resource Strain (CARDIA)    Difficulty of Paying Living Expenses: Not very hard  Food Insecurity: No Food Insecurity (03/23/2021)   Hunger Vital Sign    Worried About Running Out of Food in the Last Year: Never true    Ran Out of Food in the Last Year: Never true  Transportation Needs: No Transportation Needs (03/23/2021)   PRAPARE - Transportation    Lack of Transportation (Medical): No    Lack of  Transportation (Non-Medical): No  Physical Activity: Sufficiently Active (03/23/2021)   Exercise Vital Sign    Days of Exercise per Week: 4 days    Minutes of Exercise per Session: 60 min  Stress: No Stress Concern Present (03/23/2021)   [place]    Feeling of Stress : Not at all  Social Connections: Moderately Integrated (03/23/2021)   Social Connection and Isolation Panel [NHANES]    Frequency of Communication with Friends and Family: More than three times a week    Frequency of Social Gatherings with Friends and Family: More than three times a week    Attends Religious Services: More than 4 times per year    Active Member of [employer] or Organizations: Yes    Attends Archivist Meetings: More than 4 times per year    Marital Status: Widowed  Intimate Partner Violence: Not At Risk (03/23/2021)   Humiliation, Afraid, Rape, and Kick questionnaire    Fear of Current or Ex-Partner: No    Emotionally Abused: No    Physically Abused: No    Sexually Abused: No   Family History  Problem Relation Age of Onset   Heart disease Father    Lung cancer Sister    Lung cancer Brother       Review of Systems     Objective:   Physical Exam Vitals reviewed.  Constitutional:      General: She is not in acute distress.    Appearance: Normal appearance. She is normal weight. She is not ill-appearing,  toxic-appearing or diaphoretic.  HENT:     Head: Normocephalic and atraumatic.  Neck:     Vascular: No carotid bruit.  Cardiovascular:     Rate and Rhythm: Normal rate and regular rhythm.     Heart sounds: Normal heart sounds. No murmur heard.    No friction rub. No gallop.  Pulmonary:     Effort: Pulmonary effort is normal. No respiratory distress.     Breath sounds: No stridor. No wheezing or rhonchi.  Musculoskeletal:     Cervical back: Neck supple. No rigidity or tenderness.     Right knee: Effusion present. Decreased range of motion. Tenderness present over the medial joint line.  Lymphadenopathy:     Cervical: No cervical adenopathy.  Skin:    General: Skin is dry.  Neurological:     General: No focal deficit present.     Mental Status: She is alert and oriented to person, place, and time. Mental status is at baseline.     Cranial Nerves: No cranial nerve deficit.           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  It is also possible that she could have a meniscal tear or cartilage defect not seen on the CT scan.  Therefore we discussed trying empiric treatment with doxycycline for 21 days versus obtaining an MRI of the knee.  Due to the potential risk and benefit of taking doxycycline, we decided to do the doxycycline 100 mg twice daily for 21 days.  If the knee pain continues to worsen, I would recommend.

## 2021-08-16 ENCOUNTER — Other Ambulatory Visit: Payer: Self-pay | Admitting: Internal Medicine

## 2021-08-28 ENCOUNTER — Emergency Department (HOSPITAL_COMMUNITY)
Admission: EM | Admit: 2021-08-28 | Discharge: 2021-08-28 | Discharge status: Against Medical Advice | Payer: Medicare Other | Attending: Emergency Medicine | Admitting: Emergency Medicine

## 2021-08-28 ENCOUNTER — Ambulatory Visit
Admission: EM | Admit: 2021-08-28 | Discharge: 2021-08-28 | Disposition: A | Discharge status: Home / Self Care | Payer: Medicare Other | Attending: Nurse Practitioner | Admitting: Nurse Practitioner

## 2021-08-28 ENCOUNTER — Other Ambulatory Visit: Payer: Self-pay

## 2021-08-28 ENCOUNTER — Encounter (HOSPITAL_COMMUNITY): Payer: Self-pay | Admitting: *Deleted

## 2021-08-28 DIAGNOSIS — S8011XA Contusion of right lower leg, initial encounter: Secondary | ICD-10-CM

## 2021-08-28 DIAGNOSIS — Z5321 Procedure and treatment not carried out due to patient leaving prior to being seen by health care provider: Secondary | ICD-10-CM | POA: Diagnosis not present

## 2021-08-28 DIAGNOSIS — S8991XA Unspecified injury of right lower leg, initial encounter: Secondary | ICD-10-CM | POA: Diagnosis present

## 2021-08-28 DIAGNOSIS — W228XXA Striking against or struck by other objects, initial encounter: Secondary | ICD-10-CM | POA: Insufficient documentation

## 2021-08-28 NOTE — Discharge Instructions (Signed)
Keep the Ace wrap in place while symptoms persist. Apply ice to the affected area.  Apply for 20 minutes, remove for 1 hour, then repeat.  Do this is much as possible to help with pain and swelling.  Place something between the ice pack and your skin. May take over-the-counter Tylenol for pain or discomfort. Elevate the right lower leg is much as possible while symptoms persist. Follow-up immediately if you develop pain, worsening swelling, redness, redness that goes up or down the leg, inability to bear weight, or other concerns. Follow-up with her primary care physician or in this clinic as needed.

## 2021-08-28 NOTE — ED Provider Notes (Signed)
[facility]    CSN: [id number] Arrival date & time: 08/28/21  1843      History   Chief Complaint Chief Complaint  Patient presents with   Leg Injury    HPI Lauren House is a 86 y.o. female.   HPI  Notes with her daughter after she hit her right lower leg on her car door.  Patient has a large bruise to the left lower extremity.  Patient denies numbness, tingling, radiation of pain, bleeding, or inability to bear weight.  Patient denies use of blood thinners.  Patient has a history of a blood clotting disorder.  Past Medical History:  Diagnosis Date   Anxiety    Apical variant hypertrophic cardiomyopathy ([place])    Arthritis    Borderline diabetes mellitus    Bruises easily    Bursitis of left hip    Coronary atherosclerosis of native coronary artery    a. 2011: Cath showing nonobstructive CAD - 50-60% LAD (normal FFR) b. 06/2015: Cath showing 80% stenosis in the Mid LAD, tx w/ Promus DES 2.25 mm x 16 mm    Essential hypertension, benign    History of kidney stones    History of skin cancer    Basal cell - abdomen   Hyperlipidemia    Diet controlled.cannot take meds   Nocturia    Osteoporosis    Overactive bladder    Shortness of breath dyspnea    Vertigo     Patient Active Problem List   Diagnosis Date Noted   Blood clotting disorder ([place]) 08/15/2020   Coronary artery disease due to lipid rich plaque 01/01/2017   CAD S/P percutaneous coronary angioplasty 07/22/2015   DOE (dyspnea on exertion)    Unstable angina (HCC) 07/12/2015   Heart palpitations 02/14/2015   Hip bursitis 03/13/2014   Trochanteric bursitis of left hip 03/12/2014   Exertional chest pain 02/10/2014   Preoperative cardiovascular examination 02/10/2014   Angina pectoris, variant ([place]) 12/24/2012   Left hip pain 12/16/2012   Bursitis of left hip 12/16/2012   Osteoarthritis, hip, bilateral 12/16/2012   Chest pain 08/15/2012   Apical variant hypertrophic cardiomyopathy ([place])  08/15/2012   Bilateral inguinal hernia, right symptomatic 09/21/2011   [place] 10/18/2008   POSTMENOPAUSAL OSTEOPOROSIS 09/01/2008   HIP PAIN, LEFT 01/19/2008   CARCINOMA, BASAL CELL, SKIN 10/02/2006   VARICOSE VEIN 09/24/2006   ACTINIC KERATOSIS 09/24/2006   NECK PAIN 09/24/2006   BACK PAIN 09/24/2006   NEOP, BNG, SCALP/SKIN, NECK 04/16/2006   FIBROIDS, UTERUS 04/16/2006   Mixed hyperlipidemia 04/16/2006   MACULAR DEGENERATION 04/16/2006   Essential hypertension 04/16/2006   ALLERGIC RHINITIS 04/16/2006   OVERACTIVE BLADDER 04/16/2006   URINARY INCONTINENCE 04/16/2006   SKIN CANCER, HX OF 04/16/2006   VERTIGO, HX OF 04/16/2006   RENAL CALCULUS, HX OF 04/16/2006    Past Surgical History:  Procedure Laterality Date   ABDOMINAL HYSTERECTOMY     CARDIAC CATHETERIZATION N/A 07/22/2015   Procedure: Left Heart Cath and Coronary Angiography;  Surgeon: Leonie Man, MD;  Location: [facility];  Service: Cardiovascular;  Laterality: N/A;   CARDIAC CATHETERIZATION N/A 07/22/2015   Procedure: Coronary Stent Intervention;  Surgeon: Leonie Man, MD;  Location: [facility];  Service: Cardiovascular;  Laterality: N/A;   CORONARY STENT PLACEMENT  07/22/2015   mid lad  des    EYE SURGERY     cataracts bilateral   HEMORRHOID SURGERY     INGUINAL HERNIA REPAIR  10/11/2011  Procedure: HERNIA REPAIR INGUINAL ADULT;  Surgeon: Stark Klein, MD;  Location: [facility];  Service: General;  Laterality: Right;   OPEN SURGICAL REPAIR OF GLUTEAL TENDON Left 03/12/2014   Procedure: LEFT HIP BURSECTOMY AND GLUTEAL TENDON REPAIR;  Surgeon: [name], MD;  Location: [facility];  Service: Orthopedics;  Laterality: Left;    OB History     Gravida  4   Para  4   Term  4   Preterm      AB      Living  4      SAB      IAB      Ectopic      Multiple      Live Births               Home Medications    Prior to Admission medications   Medication Sig Start Date End Date  Taking? Authorizing Provider  alendronate (FOSAMAX) 70 MG tablet Take 1 tablet (70 mg total) by mouth every 7 (seven) days. Take with a full glass of water on an empty stomach. 12/28/19   Susy Frizzle, MD  aspirin EC 81 MG tablet Take 81 mg by mouth daily.    [provider]  Butalbital-APAP-Caffeine 50-300-40 MG CAPS Take 1 tablet by mouth every 6 (six) hours as needed (migraine). 08/15/20   Susy Frizzle, MD  Cyanocobalamin (VITAMIN B12 PO) Take 1 tablet by mouth daily.    [provider]  doxycycline (VIBRA-TABS) 100 MG tablet Take 1 tablet (100 mg total) by mouth 2 (two) times daily. 08/10/21   Susy Frizzle, MD  Evolocumab (REPATHA SURECLICK) 175 MG/ML SOAJ Inject 1 Dose into the skin every 14 (fourteen) days. 01/24/21   Hilty, Nadean Corwin, MD  fluticasone (FLONASE) 50 MCG/ACT nasal spray USE TWO SPRAY IN [facility] NOSTRIL EVERY DAY 11/01/15   Susy Frizzle, MD  hydrocortisone cream 1 % Apply 1 application topically 2 (two) times daily. 08/31/16   Alycia Rossetti, MD  metoprolol tartrate (LOPRESSOR) 25 MG tablet Take 1 tablet (25 mg total) by mouth 2 (two) times daily. 03/03/21   Hilty, Nadean Corwin, MD  Multiple Vitamins-Minerals (OCUVITE PO) Take 1 tablet by mouth daily.    [provider]  nitroGLYCERIN (NITROSTAT) 0.4 MG SL tablet Place 1 tablet (0.4 mg total) under the tongue every 5 (five) minutes as needed for chest pain. 08/26/14   Hilty, Nadean Corwin, MD  Polyethyl Glycol-Propyl Glycol (SYSTANE OP) Apply 1 drop to eye 3 (three) times daily as needed (Dry eyes).    [provider]  ranolazine (RANEXA) 500 MG 12 hr tablet Take 1 tablet by mouth twice daily 08/16/21   Hilty, Nadean Corwin, MD  senna-docusate (SENOKOT-S) 8.6-50 MG tablet Take 1 tablet by mouth at bedtime as needed for mild constipation or moderate constipation. 01/16/18   Long, Wonda Olds, MD    Family History Family History  Problem Relation Age of Onset   Heart disease Father    Lung cancer  Sister    Lung cancer Brother     Social History Social History   Tobacco Use   Smoking status: Never   Smokeless tobacco: Never  Vaping Use   Vaping Use: Never used  Substance Use Topics   Alcohol use: No   Drug use: No     Allergies   Chlorhexidine, Vancomycin, Aspirin, Atorvastatin, Bactrim [sulfamethoxazole-trimethoprim], Cephalosporins, Ciprofloxacin, Clarithromycin, Colestipol hcl, Ezetimibe, Prednisone, Simvastatin, Penicillins, and Tape   Review  of Systems Review of Systems Per HPI  Physical Exam Triage Vital Signs ED Triage Vitals  Enc Vitals Group     BP 08/28/21 1922 (!) 154/76     Pulse Rate 08/28/21 1922 78     Resp 08/28/21 1922 18     Temp 08/28/21 1922 98.1 F (36.7 C)     Temp Source 08/28/21 1922 Oral     SpO2 08/28/21 1922 93 %     Weight --      Height --      Head Circumference --      Peak Flow --      Pain Score 08/28/21 1926 3     Pain Loc --      Pain Edu? --      Excl. in [place]? --    No data found.  Updated Vital Signs BP (!) 154/76 (BP Location: Right Arm)   Pulse 78   Temp 98.1 F (36.7 C) (Oral)   Resp 18   SpO2 93%   Visual Acuity Right Eye Distance:   Left Eye Distance:   Bilateral Distance:    Right Eye Near:   Left Eye Near:    Bilateral Near:     Physical Exam Vitals and nursing note reviewed.  Constitutional:      Appearance: Normal appearance.  Eyes:     Extraocular Movements: Extraocular movements intact.     Pupils: Pupils are equal, round, and reactive to light.  Cardiovascular:     Rate and Rhythm: Normal rate and regular rhythm.  Pulmonary:     Effort: Pulmonary effort is normal.     Breath sounds: Normal breath sounds.  Abdominal:     General: Bowel sounds are normal.     Palpations: Abdomen is soft.  Musculoskeletal:     Right lower leg: Swelling present.     Comments: Hematoma noted to the anterolateral aspect of the right lower extremity.  No deformity, tenderness, or edema present.  Skin:     General: Skin is warm and dry.  Neurological:     General: No focal deficit present.     Mental Status: She is alert and oriented to person, place, and time.  Psychiatric:        Mood and Affect: Mood normal.        Behavior: Behavior normal.      UC Treatments / Results  Labs (all labs ordered are listed, but only abnormal results are displayed) Labs Reviewed - No data to display  EKG   Radiology No results found.  Procedures Procedures (including critical care time)  Medications Ordered in UC Medications - No data to display  Initial Impression / Assessment and Plan / UC Course  I have reviewed the triage vital signs and the nursing notes.  Pertinent labs & imaging results that were available during my care of the patient were reviewed by me and considered in my medical decision making (see chart for details).  Patient presents after hitting her right lower extremity by her car door today.  Patient has a hematoma to the anterior lateral aspect of her right lower leg.  There is no redness, drainage, fluctuance, streaking, or other signs of infection on exam.  Symptoms are consistent with a hematoma.  Supportive care recommendations were provided to the patient and her family.  Strict return precautions were provided to both the patient and her family.  Patient advised to follow-up as needed. Final Clinical Impressions(s) / UC Diagnoses  Final diagnoses:  Hematoma of right lower extremity, initial encounter     Discharge Instructions      Keep the Ace wrap in place while symptoms persist. Apply ice to the affected area.  Apply for 20 minutes, remove for 1 hour, then repeat.  Do this is much as possible to help with pain and swelling.  Place something between the ice pack and your skin. May take over-the-counter Tylenol for pain or discomfort. Elevate the right lower leg is much as possible while symptoms persist. Follow-up immediately if you develop pain, worsening  swelling, redness, redness that goes up or down the leg, inability to bear weight, or other concerns. Follow-up with her primary care physician or in this clinic as needed.     ED Prescriptions   None    PDMP not reviewed this encounter.   Tish Men, NP 08/28/21 1952

## 2021-08-28 NOTE — ED Triage Notes (Signed)
Pt presents with bruising and swelling to right lower leg from hitting on car door

## 2021-08-28 NOTE — ED Triage Notes (Signed)
Pt with bruising to right lower leg after hitting her leg on the car door.  Denies any blood thinners.

## 2021-09-08 ENCOUNTER — Ambulatory Visit (INDEPENDENT_AMBULATORY_CARE_PROVIDER_SITE_OTHER): Payer: Medicare Other | Admitting: Family Medicine

## 2021-09-08 VITALS — BP 120/60 | HR 61 | Temp 98.1°F | Ht 67.0 in | Wt 142.0 lb

## 2021-09-08 DIAGNOSIS — S8011XD Contusion of right lower leg, subsequent encounter: Secondary | ICD-10-CM

## 2021-09-08 NOTE — Progress Notes (Signed)
Subjective:    Patient ID: Lauren House, female    DOB: [birth year], 86 y.o.   MRN: [id number]  HPI 2 weeks ago, the patient struck her right anterior shin while walking.  She developed a large hematoma and she went to a local urgent care where they wrapped it with an Ace bandage and recommended that she elevate her leg.  She presents today due to pain and tenderness in her leg.  She has a large hematoma over the anterior portion of her right shin.  There is purple discoloration extending down her shin to her lateral malleolus when it stops abruptly.  This occurred 2 weeks ago.  She has strong pulses both of the posterior tibialis and the dorsal pedal pulse.  She denies any numbness or tingling in her feet.  She is able to move her toes and her feet and extend and flex her ankle.  There is no evidence of any compartment syndrome however she is still tender to palpation over the hematoma over her anterior skin.  This hematoma is roughly 6 cm in diameter Past Medical History:  Diagnosis Date   Anxiety    Apical variant hypertrophic cardiomyopathy ([place])    Arthritis    Borderline diabetes mellitus    Bruises easily    Bursitis of left hip    Coronary atherosclerosis of native coronary artery    a. 2011: Cath showing nonobstructive CAD - 50-60% LAD (normal FFR) b. 06/2015: Cath showing 80% stenosis in the Mid LAD, tx w/ Promus DES 2.25 mm x 16 mm    Essential hypertension, benign    History of kidney stones    History of skin cancer    Basal cell - abdomen   Hyperlipidemia    Diet controlled.cannot take meds   Nocturia    Osteoporosis    Overactive bladder    Shortness of breath dyspnea    Vertigo    Past Surgical History:  Procedure Laterality Date   ABDOMINAL HYSTERECTOMY     CARDIAC CATHETERIZATION N/A 07/22/2015   Procedure: Left Heart Cath and Coronary Angiography;  Surgeon: Leonie Man, MD;  Location: [facility];  Service: Cardiovascular;  Laterality: N/A;   CARDIAC  CATHETERIZATION N/A 07/22/2015   Procedure: Coronary Stent Intervention;  Surgeon: Leonie Man, MD;  Location: [facility];  Service: Cardiovascular;  Laterality: N/A;   CORONARY STENT PLACEMENT  07/22/2015   mid lad  des    EYE SURGERY     cataracts bilateral   HEMORRHOID SURGERY     INGUINAL HERNIA REPAIR  10/11/2011   Procedure: HERNIA REPAIR INGUINAL ADULT;  Surgeon: Stark Klein, MD;  Location: [facility];  Service: General;  Laterality: Right;   OPEN SURGICAL REPAIR OF GLUTEAL TENDON Left 03/12/2014   Procedure: LEFT HIP BURSECTOMY AND GLUTEAL TENDON REPAIR;  Surgeon: [name], MD;  [facility];  Service: Orthopedics;  Laterality: Left;    Allergies  Allergen Reactions   Chlorhexidine Itching   Vancomycin Hives   Aspirin Other (See Comments)    Nose bleeds with full strength   Atorvastatin Other (See Comments)    Muscle pain   Bactrim [Sulfamethoxazole-Trimethoprim] Other (See Comments)    Doesn't remember    Cephalosporins Hives and Itching   Ciprofloxacin Nausea Only   Clarithromycin Other (See Comments)    Doesn't remember    Colestipol Hcl Other (See Comments)    Hallucinations   Ezetimibe Other (See Comments)  GI upset   Prednisone    Simvastatin Other (See Comments)    Muscle spasms   Penicillins Rash    Has patient had a PCN reaction causing immediate rash, facial/tongue/throat swelling, SOB or lightheadedness with hypotension: yes Has patient had a PCN reaction causing severe rash involving mucus membranes or skin necrosis: no Has patient had a PCN reaction that required hospitalization no Has patient had a PCN reaction occurring within the last 10 years: no If all of the above answers are "NO", then may proceed with Cephalosporin use.    Tape Rash   Social History   Socioeconomic History   Marital status: Widowed    Spouse name: Not on file   Number of children: 4   Years of education: Not on file   Highest education level: Not on file   Occupational History   Not on file  Tobacco Use   Smoking status: Never   Smokeless tobacco: Never  Vaping Use   Vaping Use: Never used  Substance and Sexual Activity   Alcohol use: No   Drug use: No   Sexual activity: Never  Other Topics Concern   Not on file  Social History Narrative   3 sons, 1 daughter. 1 son died in 06/06/2016 (unexpectedly)   Husband died in Jun 07, 2014.   6 grandchildren    63 great grandchildren   Social Determinants of Health   Financial Resource Strain: Low Risk  (03/23/2021)   Overall Financial Resource Strain (CARDIA)    Difficulty of Paying Living Expenses: Not very hard  Food Insecurity: No Food Insecurity (03/23/2021)   Hunger Vital Sign    Worried About Running Out of Food in the Last Year: Never true    Ran Out of Food in the Last Year: Never true  Transportation Needs: No Transportation Needs (03/23/2021)   PRAPARE - Hydrologist (Medical): No    Lack of Transportation (Non-Medical): No  Physical Activity: Sufficiently Active (03/23/2021)   Exercise Vital Sign    Days of Exercise per Week: 4 days    Minutes of Exercise per Session: 60 min  Stress: No Stress Concern Present (03/23/2021)   [place]    Feeling of Stress : Not at all  Social Connections: Moderately Integrated (03/23/2021)   Social Connection and Isolation Panel [NHANES]    Frequency of Communication with Friends and Family: More than three times a week    Frequency of Social Gatherings with Friends and Family: More than three times a week    Attends Religious Services: More than 4 times per year    Active Member of [employer] or Organizations: Yes    Attends Archivist Meetings: More than 4 times per year    Marital Status: Widowed  Intimate Partner Violence: Not At Risk (03/23/2021)   Humiliation, Afraid, Rape, and Kick questionnaire    Fear of Current or Ex-Partner: No    Emotionally  Abused: No    Physically Abused: No    Sexually Abused: No   Family History  Problem Relation Age of Onset   Heart disease Father    Lung cancer Sister    Lung cancer Brother       Review of Systems     Objective:   Physical Exam Vitals reviewed.  Constitutional:      General: She is not in acute distress.    Appearance: Normal appearance. She is normal weight. She is  not ill-appearing, toxic-appearing or diaphoretic.  HENT:     Head: Normocephalic and atraumatic.  Neck:     Vascular: No carotid bruit.  Cardiovascular:     Rate and Rhythm: Normal rate and regular rhythm.     Heart sounds: Normal heart sounds. No murmur heard.    No friction rub. No gallop.  Pulmonary:     Effort: Pulmonary effort is normal. No respiratory distress.     Breath sounds: No stridor. No wheezing or rhonchi.  Musculoskeletal:     Cervical back: Neck supple. No rigidity or tenderness.       Legs:  Lymphadenopathy:     Cervical: No cervical adenopathy.  Skin:    General: Skin is dry.  Neurological:     General: No focal deficit present.     Mental Status: She is alert and oriented to person, place, and time. Mental status is at baseline.     Cranial Nerves: No cranial nerve deficit.           Assessment & Plan:  Leg hematoma, right, subsequent encounter At this time there is no sign of compartment syndrome.  Recommended that she elevate the leg is much as possible.  Recommended that she wear an Ace wrap or some type of compression.  Recommended warm compresses for comfort.  Anticipate 4 to 6 weeks for hematoma to gradually resolve.  She states that it has already shrunk in size.

## 2021-10-04 DIAGNOSIS — M25561 Pain in right knee: Secondary | ICD-10-CM | POA: Insufficient documentation

## 2021-10-05 DIAGNOSIS — M25561 Pain in right knee: Secondary | ICD-10-CM | POA: Diagnosis not present

## 2021-11-15 ENCOUNTER — Other Ambulatory Visit: Payer: Self-pay | Admitting: *Deleted

## 2021-11-15 DIAGNOSIS — D239 Other benign neoplasm of skin, unspecified: Secondary | ICD-10-CM | POA: Diagnosis not present

## 2021-11-15 DIAGNOSIS — Z1283 Encounter for screening for malignant neoplasm of skin: Secondary | ICD-10-CM | POA: Diagnosis not present

## 2021-11-15 NOTE — Patient Outreach (Signed)
  Care Coordination   11/15/2021  Name: Lauren House MRN: [id number] DOB: [birth year]   Care Coordination Outreach Attempts:  An unsuccessful telephone outreach was attempted today to offer the patient information about available care coordination services as a benefit of their health plan. HIPAA compliant message left on voicemail, providing contact information for CSW, encouraging patient to return CSW's call at her earliest convenience.  Follow Up Plan:  Additional outreach attempts will be made to offer the patient care coordination information and services.   Encounter Outcome:  No Answer.   Care Coordination Interventions Activated:  No.    Care Coordination Interventions:  No, not indicated.    Nat Christen, BSW, MSW, LCSW  Licensed Education officer, [facility]  Mailing [place] [address], [place], [place] [address] Physical Address-[address], [place], [place] [address] Toll Free Main # [phone] Fax # [phone] Cell # [phone] Di [email or web address]

## 2021-11-26 ENCOUNTER — Other Ambulatory Visit: Payer: Self-pay | Admitting: *Deleted

## 2021-11-26 NOTE — Patient Outreach (Signed)
  Care Coordination   11/26/2021  Name: Lauren House MRN: [id number] DOB: 04-08-1935   Care Coordination Outreach Attempts:  A second unsuccessful outreach was attempted today to offer the patient with information about available care coordination services as a benefit of their health plan.   HIPAA compliant message left on voicemail for patient, providing contact information for CSW, encouraging patient to return CSW's call at her earliest convenience.  Follow Up Plan:  Additional outreach attempts will be made to offer the patient care coordination information and services.   Encounter Outcome:  No Answer.   Care Coordination Interventions Activated:  No.    Care Coordination Interventions:  No, not indicated.    Nat Christen, BSW, MSW, LCSW  Licensed Education officer, environmental Health System  Mailing [place] [address], [place], [place] [address] Physical Address[address] [place], [place] [address] Toll Free Main # [phone] Fax # [phone] Cell # [phone] [email or web address]

## 2021-11-27 ENCOUNTER — Ambulatory Visit (INDEPENDENT_AMBULATORY_CARE_PROVIDER_SITE_OTHER): Payer: Medicare Other | Admitting: Family Medicine

## 2021-11-27 VITALS — BP 126/82 | HR 67 | Temp 97.5°F | Wt 146.6 lb

## 2021-11-27 DIAGNOSIS — G4762 Sleep related leg cramps: Secondary | ICD-10-CM

## 2021-11-27 DIAGNOSIS — E612 Magnesium deficiency: Secondary | ICD-10-CM | POA: Diagnosis not present

## 2021-11-27 MED ORDER — METHOCARBAMOL 750 MG PO TABS: 750.0000 mg | ORAL_TABLET | Freq: Every evening | ORAL | 1 refills | Status: DC | PRN

## 2021-11-27 NOTE — Progress Notes (Signed)
Subjective:    Patient ID: Lauren House, female    DOB: [birth year], 86 y.o.   MRN: [id number]  Foot Injury   Patient has recently been dealing with nocturnal cramps.  For instance she will have cramps in her right calf.  These will wake her up from sleep.  She will also have cramps in her left leg that causes her left foot to rotate inwardly.  These are painful.  Through stretching the cramps will gradually go away.  However, they have been occurring frequently at night.  They do not bother her during the day. Past Medical History:  Diagnosis Date   Anxiety    Apical variant hypertrophic cardiomyopathy ([place])    Arthritis    Borderline diabetes mellitus    Bruises easily    Bursitis of left hip    Coronary atherosclerosis of native coronary artery    a. 2011: Cath showing nonobstructive CAD - 50-60% LAD (normal FFR) b. 06/2015: Cath showing 80% stenosis in the Mid LAD, tx w/ Promus DES 2.25 mm x 16 mm    Essential hypertension, benign    History of kidney stones    History of skin cancer    Basal cell - abdomen   Hyperlipidemia    Diet controlled.cannot take meds   Nocturia    Osteoporosis    Overactive bladder    Shortness of breath dyspnea    Vertigo    Past Surgical History:  Procedure Laterality Date   ABDOMINAL HYSTERECTOMY     CARDIAC CATHETERIZATION N/A 07/22/2015   Procedure: Left Heart Cath and Coronary Angiography;  Surgeon: Leonie Man, MD;  Location: [facility];  Service: Cardiovascular;  Laterality: N/A;   CARDIAC CATHETERIZATION N/A 07/22/2015   Procedure: Coronary Stent Intervention;  Surgeon: Leonie Man, MD;  Location: [facility];  Service: Cardiovascular;  Laterality: N/A;   CORONARY STENT PLACEMENT  07/22/2015   mid lad  des    EYE SURGERY     cataracts bilateral   HEMORRHOID SURGERY     INGUINAL HERNIA REPAIR  10/11/2011   Procedure: HERNIA REPAIR INGUINAL ADULT;  Surgeon: Stark Klein, MD;  Location: [facility];  Service: General;   Laterality: Right;   OPEN SURGICAL REPAIR OF GLUTEAL TENDON Left 03/12/2014   Procedure: LEFT HIP BURSECTOMY AND GLUTEAL TENDON REPAIR;  Surgeon: [name], MD;  [facility];  Service: Orthopedics;  Laterality: Left;    Allergies  Allergen Reactions   Chlorhexidine Itching   Vancomycin Hives   Aspirin Other (See Comments)    Nose bleeds with full strength   Atorvastatin Other (See Comments)    Muscle pain   Bactrim [Sulfamethoxazole-Trimethoprim] Other (See Comments)    Doesn't remember    Cephalosporins Hives and Itching   Ciprofloxacin Nausea Only   Clarithromycin Other (See Comments)    Doesn't remember    Colestipol Hcl Other (See Comments)    Hallucinations   Ezetimibe Other (See Comments)    GI upset   Prednisone    Simvastatin Other (See Comments)    Muscle spasms   Penicillins Rash    Has patient had a PCN reaction causing immediate rash, facial/tongue/throat swelling, SOB or lightheadedness with hypotension: yes Has patient had a PCN reaction causing severe rash involving mucus membranes or skin necrosis: no Has patient had a PCN reaction that required hospitalization no Has patient had a PCN reaction occurring within the last 10 years: no If all of the above  answers are "NO", then may proceed with Cephalosporin use.    Tape Rash   Social History   Socioeconomic History   Marital status: Widowed    Spouse name: Not on file   Number of children: 4   Years of education: Not on file   Highest education level: Not on file  Occupational History   Not on file  Tobacco Use   Smoking status: Never   Smokeless tobacco: Never  Vaping Use   Vaping Use: Never used  Substance and Sexual Activity   Alcohol use: No   Drug use: No   Sexual activity: Never  Other Topics Concern   Not on file  Social History Narrative   3 sons, 1 daughter. 1 son died in 07-Jun-2016 (unexpectedly)   Husband died in 2014-06-08.   6 grandchildren    66 great grandchildren   Social  Determinants of Health   Financial Resource Strain: Low Risk  (03/23/2021)   Overall Financial Resource Strain (CARDIA)    Difficulty of Paying Living Expenses: Not very hard  Food Insecurity: No Food Insecurity (03/23/2021)   Hunger Vital Sign    Worried About Running Out of Food in the Last Year: Never true    Ran Out of Food in the Last Year: Never true  Transportation Needs: No Transportation Needs (03/23/2021)   PRAPARE - Hydrologist (Medical): No    Lack of Transportation (Non-Medical): No  Physical Activity: Sufficiently Active (03/23/2021)   Exercise Vital Sign    Days of Exercise per Week: 4 days    Minutes of Exercise per Session: 60 min  Stress: No Stress Concern Present (03/23/2021)   [place]    Feeling of Stress : Not at all  Social Connections: Moderately Integrated (03/23/2021)   Social Connection and Isolation Panel [NHANES]    Frequency of Communication with Friends and Family: More than three times a week    Frequency of Social Gatherings with Friends and Family: More than three times a week    Attends Religious Services: More than 4 times per year    Active Member of [employer] or Organizations: Yes    Attends Archivist Meetings: More than 4 times per year    Marital Status: Widowed  Intimate Partner Violence: Not At Risk (03/23/2021)   Humiliation, Afraid, Rape, and Kick questionnaire    Fear of Current or Ex-Partner: No    Emotionally Abused: No    Physically Abused: No    Sexually Abused: No   Family History  Problem Relation Age of Onset   Heart disease Father    Lung cancer Sister    Lung cancer Brother       Review of Systems     Objective:   Physical Exam Vitals reviewed.  Constitutional:      General: She is not in acute distress.    Appearance: Normal appearance. She is normal weight. She is not ill-appearing, toxic-appearing or diaphoretic.   HENT:     Head: Normocephalic and atraumatic.  Neck:     Vascular: No carotid bruit.  Cardiovascular:     Rate and Rhythm: Normal rate and regular rhythm.     Heart sounds: Normal heart sounds. No murmur heard.    No friction rub. No gallop.  Pulmonary:     Effort: Pulmonary effort is normal. No respiratory distress.     Breath sounds: No stridor. No wheezing or rhonchi.  Musculoskeletal:     Cervical back: Neck supple. No rigidity or tenderness.  Lymphadenopathy:     Cervical: No cervical adenopathy.  Skin:    General: Skin is dry.  Neurological:     General: No focal deficit present.     Mental Status: She is alert and oriented to person, place, and time. Mental status is at baseline.     Cranial Nerves: No cranial nerve deficit.           Assessment & Plan:  Nocturnal leg cramps - Plan: BASIC METABOLIC PANEL WITH GFR, Magnesium I will check electrolytes including a BMP and magnesium level.  I recommended trying tonic water with quinine.  She can also try over-the-counter magnesium.  If this is not helpful, we can even try Robaxin-750 milligrams p.o. nightly

## 2021-11-28 ENCOUNTER — Ambulatory Visit: Payer: Self-pay | Admitting: *Deleted

## 2021-11-28 ENCOUNTER — Encounter: Payer: Self-pay | Admitting: *Deleted

## 2021-11-28 LAB — BASIC METABOLIC PANEL WITH GFR
BUN/Creatinine Ratio: 18 (calc) (ref 6–22)
BUN: 17 mg/dL (ref 7–25)
CO2: 29 mmol/L (ref 20–32)
Calcium: 9.5 mg/dL (ref 8.6–10.4)
Chloride: 101 mmol/L (ref 98–110)
Creat: 0.97 mg/dL — ABNORMAL HIGH (ref 0.60–0.95)
Glucose, Bld: 88 mg/dL (ref 65–99)
Potassium: 4.5 mmol/L (ref 3.5–5.3)
Sodium: 139 mmol/L (ref 135–146)
eGFR: 58 mL/min/{1.73_m2} — ABNORMAL LOW (ref >=60)

## 2021-11-28 LAB — MAGNESIUM: Magnesium: 2.4 mg/dL (ref 1.5–2.5)

## 2021-11-28 NOTE — Patient Instructions (Signed)
Visit Information  Thank you for taking time to visit with me today. Please don't hesitate to contact me if I can be of assistance to you.   Please call the care guide team at [phone] if you need to cancel or reschedule your appointment.   If you are experiencing a Mental Health or Behavioral Health Crisis or need someone to talk to, please call the Suicide and Crisis Lifeline: 988 call the [place] National Suicide Prevention Lifeline: 1-800-273-8255 or TTY: 1-800-799-4 TTY (1-800-799-4889) to talk to a trained counselor call 1-800-273-TALK (toll free, 24 hour hotline) go to [facility] [address], [place] ([phone]) call the [place] Crisis Line: 800-939-9988 call 911  Patient verbalizes understanding of instructions and care plan provided today and agrees to view in MyChart. Active MyChart status and patient understanding of how to access instructions and care plan via MyChart confirmed with patient.     No further follow up required.  Jani Moronta, BSW, MSW, LCSW  Licensed Clinical Social Worker  [facility]  Mailing Address-[address], [place], [place] [address] Physical Address-[address], [place], [place] [address] Toll Free Main # [phone] Fax # [phone] Cell # [phone] [email or web address]            

## 2021-11-28 NOTE — Patient Outreach (Signed)
  Care Coordination   Initial Visit Note   11/28/2021  Name: Lauren House MRN: [id number] DOB: Sep 21, 1935  Lauren House is a 86 y.o. year old female who sees Pickard, Cammie Mcgee, MD for primary care. I spoke with Lauren House by phone today.  What matters to the patients health and wellness today?  No Interventions Identified.  SDOH assessments and interventions completed:  Yes.  SDOH Interventions Today    Flowsheet Row Most Recent Value  SDOH Interventions   Food Insecurity Interventions Intervention Not Indicated  Housing Interventions Intervention Not Indicated  Transportation Interventions Intervention Not Indicated  Utilities Interventions Intervention Not Indicated  Alcohol Usage Interventions Intervention Not Indicated (Score <7)  Financial Strain Interventions Intervention Not Indicated  Physical Activity Interventions Intervention Not Indicated  Stress Interventions Intervention Not Indicated  Social Connections Interventions Intervention Not Indicated     Care Coordination Interventions Activated:  Yes.   Care Coordination Interventions:  Yes, provided.   Follow up plan: No further intervention required.   Encounter Outcome:  Pt. Visit Completed.   Nat Christen, BSW, MSW, LCSW  Licensed Education officer, [facility]  Mailing [place] [address], [place], [place] [address] Physical Address-[address] [place], [place] [address] Toll Free Main # [phone] Fax # [phone] Cell # [phone] [email or web address]

## 2021-12-16 ENCOUNTER — Other Ambulatory Visit: Payer: Self-pay | Admitting: Internal Medicine

## 2021-12-20 ENCOUNTER — Other Ambulatory Visit (HOSPITAL_COMMUNITY): Payer: Self-pay | Admitting: Family Medicine

## 2021-12-20 DIAGNOSIS — Z1231 Encounter for screening mammogram for malignant neoplasm of breast: Secondary | ICD-10-CM

## 2022-01-01 ENCOUNTER — Ambulatory Visit (HOSPITAL_COMMUNITY)
Admission: RE | Admit: 2022-01-01 | Discharge: 2022-01-01 | Disposition: A | Discharge status: Home / Self Care | Payer: Medicare Other | Source: Ambulatory Visit | Attending: Family Medicine | Admitting: Family Medicine

## 2022-01-01 DIAGNOSIS — Z1231 Encounter for screening mammogram for malignant neoplasm of breast: Secondary | ICD-10-CM | POA: Insufficient documentation

## 2022-02-15 ENCOUNTER — Ambulatory Visit (INDEPENDENT_AMBULATORY_CARE_PROVIDER_SITE_OTHER): Payer: Medicare Other

## 2022-02-15 ENCOUNTER — Other Ambulatory Visit: Payer: Self-pay | Admitting: Internal Medicine

## 2022-02-15 DIAGNOSIS — S8011XD Contusion of right lower leg, subsequent encounter: Secondary | ICD-10-CM

## 2022-02-15 NOTE — Progress Notes (Signed)
Pt fell this am and hit leg. Was checked out by EMS but has abrasion to R shin that is oozing blood off and on. Would like are a checked. Per Dr. Samella Parr order dressing applied of tegaderm and wrapped with kerlix. Adivsed pt to change dressing every 3 days per Dr. Dennard Schaumann. Advised pt to watch area for redness, increasing pain, fever. Pt verbalized understanding of all. Mjp,lpn

## 2022-03-14 ENCOUNTER — Ambulatory Visit: Payer: Medicare Other | Attending: Medical | Admitting: Medical

## 2022-03-14 ENCOUNTER — Encounter: Payer: Self-pay | Admitting: Medical

## 2022-03-14 VITALS — BP 170/78 | HR 75 | Wt 147.0 lb

## 2022-03-14 DIAGNOSIS — I872 Venous insufficiency (chronic) (peripheral): Secondary | ICD-10-CM | POA: Diagnosis not present

## 2022-03-14 DIAGNOSIS — E782 Mixed hyperlipidemia: Secondary | ICD-10-CM

## 2022-03-14 DIAGNOSIS — I10 Essential (primary) hypertension: Secondary | ICD-10-CM | POA: Diagnosis not present

## 2022-03-14 DIAGNOSIS — I422 Other hypertrophic cardiomyopathy: Secondary | ICD-10-CM | POA: Diagnosis not present

## 2022-03-14 DIAGNOSIS — I2583 Coronary atherosclerosis due to lipid rich plaque: Secondary | ICD-10-CM

## 2022-03-14 DIAGNOSIS — I2510 Atherosclerotic heart disease of native coronary artery without angina pectoris: Secondary | ICD-10-CM

## 2022-03-14 NOTE — Patient Instructions (Addendum)
Medication Instructions:  Your physician recommends that you continue on your current medications as directed. Please refer to the Current Medication list given to you today.  Re-start Aspirin   Labwork: CBC,Lipids  Testing/Procedures: Your physician has requested that you have an echocardiogram. Echocardiography is a painless test that uses sound waves to create images of your heart. It provides your doctor with information about the size and shape of your heart and how well your heart's chambers and valves are working. This procedure takes approximately one hour. There are no restrictions for this procedure. Please do NOT wear cologne, perfume, aftershave, or lotions (deodorant is allowed). Please arrive 15 minutes prior to your appointment time.   Follow-Up: 1 year Dr.Hilty  Any Other Special Instructions Will Be Listed Below (If Applicable).  If you need a refill on your cardiac medications before your next appointment, please call your pharmacy.

## 2022-03-14 NOTE — Progress Notes (Signed)
Cardiology Office Note:    Date:  03/14/2022   ID:  Lauren House, DOB 13-Jun-1935, MRN [id number]  PCP:  Lauren Frizzle, MD  [facility] Cardiologist:  None  [facility] Electrophysiologist:  None   Referring MD: Lauren Frizzle, MD   Chief Complaint: 1 year follow-up  History of Present Illness:    Lauren House is a 87 y.o. female with a hx of apical hypertrophic cardiomyopathy, CAD s/p PCI mLAD, HTN, venous insufficiency, statin intolerance, and suspected familial HLD.   Prior cath in 2011 which showed 50-60% LAD lesion, FFR was negative at the time. She also had a stress test showing no reversible ischemia. Echo 2011 showed LVEF>%5%, severe concentric LVH with increased echogenicity, consider infiltrative disease, G1DD, trace MR/TR, aortic valve sclerosis. Cardiac MRI in 2013 showed severe LVH with apical hypertrophy, no infiltrative disease. She underwent nuclear stress test 01/2013 which was negative for ischemia.  She underwent cardiac cath in 2017 for UA showing mid LAD 80% stenosis treated with DES. It also showed ost LAD 50% stenosis, normal LVEF and known apical hypertrophy, mild disease in the Cx and RCA.   Patient has been prescribed Repatha in the past and had concerns about the medications. She has familial hyperlipidemia The patient has been on chronic Ranexa for LVH  Lat seen 12/2020 and was started on PCSK9i.   Today, the patient reports she is overall soing OK. She fell out of her bed 3 weeks ago because the bed was really high. She saw PCP after this and only sustained a lower leg cut. She has occasional dizziness and lightheadedness. She does exercises classes twice a week at her church. She has exertional SOB, no chest pain. No lower leg leg edema. BP is high, which she says is abnormal for her. She is not taking Repatha. She stopped ASA due to easy bruising. Repeat BP 140/80.   Past Medical History:  Diagnosis Date   Anxiety    Apical variant  hypertrophic cardiomyopathy ([place])    Arthritis    Borderline diabetes mellitus    Bruises easily    Bursitis of left hip    Coronary atherosclerosis of native coronary artery    a. 2011: Cath showing nonobstructive CAD - 50-60% LAD (normal FFR) b. 06/2015: Cath showing 80% stenosis in the Mid LAD, tx w/ Promus DES 2.25 mm x 16 mm    Essential hypertension, benign    History of kidney stones    History of skin cancer    Basal cell - abdomen   Hyperlipidemia    Diet controlled.cannot take meds   Nocturia    Osteoporosis    Overactive bladder    Shortness of breath dyspnea    Vertigo     Past Surgical History:  Procedure Laterality Date   ABDOMINAL HYSTERECTOMY     CARDIAC CATHETERIZATION N/A 07/22/2015   Procedure: Left Heart Cath and Coronary Angiography;  Surgeon: Lauren Man, MD;  Location: [facility];  Service: Cardiovascular;  Laterality: N/A;   CARDIAC CATHETERIZATION N/A 07/22/2015   Procedure: Coronary Stent Intervention;  Surgeon: Lauren Man, MD;  Location: [facility];  Service: Cardiovascular;  Laterality: N/A;   CORONARY STENT PLACEMENT  07/22/2015   mid lad  des    EYE SURGERY     cataracts bilateral   HEMORRHOID SURGERY     INGUINAL HERNIA REPAIR  10/11/2011   Procedure: HERNIA REPAIR INGUINAL ADULT;  Surgeon: Lauren Klein, MD;  [facility];  Service: General;  Laterality: Right;   OPEN SURGICAL REPAIR OF GLUTEAL TENDON Left 03/12/2014   Procedure: LEFT HIP BURSECTOMY AND GLUTEAL TENDON REPAIR;  Surgeon: Lauren Alf, MD;  [facility];  Service: Orthopedics;  Laterality: Left;    Current Medications: Current Meds  Medication Sig   alendronate (FOSAMAX) 70 MG tablet Take 1 tablet (70 mg total) by mouth every 7 (seven) days. Take with a full glass of water on an empty stomach.   Butalbital-APAP-Caffeine 50-300-40 MG CAPS Take 1 tablet by mouth every 6 (six) hours as needed (migraine).   Cyanocobalamin (VITAMIN B12 PO) Take 1 tablet  by mouth daily.   Evolocumab (REPATHA SURECLICK) 277 MG/ML SOAJ Inject 1 Dose into the skin every 14 (fourteen) days.   fluticasone (FLONASE) 50 MCG/ACT nasal spray USE TWO SPRAY IN EACH NOSTRIL EVERY DAY   hydrocortisone cream 1 % Apply 1 application topically 2 (two) times daily.   methocarbamol (ROBAXIN-750) 750 MG tablet Take 1 tablet (750 mg total) by mouth at bedtime as needed for muscle spasms.   metoprolol tartrate (LOPRESSOR) 25 MG tablet Take 1 tablet by mouth twice daily   Multiple Vitamins-Minerals (OCUVITE PO) Take 1 tablet by mouth daily.   nitroGLYCERIN (NITROSTAT) 0.4 MG SL tablet Place 1 tablet (0.4 mg total) under the tongue every 5 (five) minutes as needed for chest pain.   Polyethyl Glycol-Propyl Glycol (SYSTANE OP) Apply 1 drop to eye 3 (three) times daily as needed (Dry eyes).   ranolazine (RANEXA) 500 MG 12 hr tablet Take 1 tablet (500 mg total) by mouth 2 (two) times daily.   senna-docusate (SENOKOT-S) 8.6-50 MG tablet Take 1 tablet by mouth at bedtime as needed for mild constipation or moderate constipation.     Allergies:   Chlorhexidine, Vancomycin, Aspirin, Atorvastatin, Bactrim [sulfamethoxazole-trimethoprim], Cephalosporins, Ciprofloxacin, Clarithromycin, Colestipol hcl, Ezetimibe, Prednisone, Simvastatin, Penicillins, and Tape   Social History   Socioeconomic History   Marital status: Widowed    Spouse name: Not on file   Number of children: 4   Years of education: 65   Highest education level: 12th grade  Occupational History   Not on file  Tobacco Use   Smoking status: Never    Passive exposure: Never   Smokeless tobacco: Never  Vaping Use   Vaping Use: Never used  Substance and Sexual Activity   Alcohol use: No   Drug use: No   Sexual activity: Not Currently  Other Topics Concern   Not on file  Social History Narrative   3 sons, 1 daughter. 1 son died in 05-15-2016 (unexpectedly)   Husband died in May 16, 2014.   6 grandchildren    4 great grandchildren    Social Determinants of Health   Financial Resource Strain: Low Risk  (11/28/2021)   Overall Financial Resource Strain (CARDIA)    Difficulty of Paying Living Expenses: Not hard at all  Food Insecurity: No Food Insecurity (11/28/2021)   Hunger Vital Sign    Worried About Running Out of Food in the Last Year: Never true    Ran Out of Food in the Last Year: Never true  Transportation Needs: No Transportation Needs (11/28/2021)   PRAPARE - Hydrologist (Medical): No    Lack of Transportation (Non-Medical): No  Physical Activity: Sufficiently Active (11/28/2021)   Exercise Vital Sign    Days of Exercise per Week: 5 days    Minutes of Exercise per Session: 60 min  Stress:  No Stress Concern Present (11/28/2021)   [place]    Feeling of Stress : Not at all  Social Connections: Moderately Integrated (11/28/2021)   Social Connection and Isolation Panel [NHANES]    Frequency of Communication with Friends and Family: More than three times a week    Frequency of Social Gatherings with Friends and Family: More than three times a week    Attends Religious Services: More than 4 times per year    Active Member of [employer] or Organizations: Yes    Attends Archivist Meetings: More than 4 times per year    Marital Status: Widowed     Family History: The patient's family history includes Heart disease in her father; Lung cancer in her brother and sister.  ROS:   Please see the history of present illness.     All other systems reviewed and are negative.  EKGs/Labs/Other Studies Reviewed:    The following studies were reviewed today:  Echo 2018 Study Conclusions   - Left ventricle: The cavity size was normal. Wall thickness was    increased in a pattern of moderate to severe LVH. Systolic    function was normal. The estimated ejection fraction was in the    range of 55% to 60%. Doppler  parameters are consistent with    abnormal left ventricular relaxation (grade 1 diastolic    dysfunction). Doppler parameters are consistent with high    ventricular filling pressure.  - Aortic valve: Mildly calcified annulus. Normal thickness    leaflets. Valve area (VTI): 1.91 cm^2. Valve area (Vmax): 2.11    cm^2. Valve area (Vmean): 1.93 cm^2.  - Mitral valve: Moderately to severely calcified annulus. Normal    thickness leaflets . There was mild regurgitation.  - Left atrium: The atrium was mildly dilated.  - Technically difficult study.   Left heart cath 2017 Mid LAD lesion, 80% stenosed. Post intervention with Promus DES 2.25 mm x 16 mm (2.4 mm), there is a 0% residual stenosis. Ost LAD lesion, 50% stenosed. stable from original catheterization The left ventricular systolic function is normal. with minimal apical filling consistent with known apical hypertrophy. Otherwise mild to moderate disease in the circumflex and RCA system     Likely culprit for the patient's symptom was the mid LAD lesion. This is treated with a DES stent.   Plan: Overnight monitoring post PCI. Check P2Y12 inhibition in the morning Continue dual therapy for minimum of 3 months, at which time can stop aspirin. Expected discharge tomorrow     Glenetta Hew, M.D., M.S. Interventional Cardiologist   Pager # [phone] Phone # [phone] [address] [place], [place] [address]  Echo 10/2009 LVEF greater than 55% Severe concentric LV wall thickening with increased echogenicity, consider infiltrative process, normal LA size, stage I diastolic dysfunction, elevated LV filling pressure, aortic valve sclerosis without significant stenosis, trace MR, TR  Cardiac MRI 2012 LV is normal in size, LV apical hypertrophy with maximal wall thickness of 1.9 cm in the anterior apical wall.  Overall myocardial is top normal for age and gender matched controls.  Global systolic function is normal.  LVEF  is 60%, no wall motion abnormalities. Normal RV Mild LAE Delayed enhancement imaging for viability is AB normal there is no MRI evidence of myocardial infarction, sarcoid, or amyloid.  However there is patchy diffuse hyperenhancement in the LV apex, consistent with scar from apical hypertrophic cardiomyopathy    EKG:  EKG is ordered today.  The ekg ordered today demonstrates normal sinus rhythm, 77 bpm, LVH, nonspecific ST changes  Recent Labs: 11/27/2021: BUN 17; Creat 0.97; Magnesium 2.4; Potassium 4.5; Sodium 139  Recent Lipid Panel    Component Value Date/Time   CHOL 290 (H) 05/06/2020 0833   TRIG 242 (H) 05/06/2020 0833   HDL 44 05/06/2020 0833   CHOLHDL 6.6 (H) 05/06/2020 0833   CHOLHDL 6.5 (H) 11/05/2016 0945   VLDL 43 (H) 11/03/2015 0825   LDLCALC 199 (H) 05/06/2020 0833   LDLCALC 210 (H) 11/05/2016 0945     Physical Exam:    VS:  BP (!) 170/78   Pulse 75   Wt 147 lb (66.7 kg)   SpO2 95%   BMI 23.02 kg/m     Wt Readings from Last 3 Encounters:  03/14/22 147 lb (66.7 kg)  11/27/21 146 lb 9.6 oz (66.5 kg)  09/08/21 142 lb (64.4 kg)     GEN:  Well nourished, well developed in no acute distress HEENT: Normal NECK: No JVD; No carotid bruits LYMPHATICS: No lymphadenopathy CARDIAC: RRR, + murmur, no rubs, gallops RESPIRATORY:  Clear to auscultation without rales, wheezing or rhonchi  ABDOMEN: Soft, non-tender, non-distended MUSCULOSKELETAL:  No edema; No deformity  SKIN: Warm and dry NEUROLOGIC:  Alert and oriented x 3 PSYCHIATRIC:  Normal affect   ASSESSMENT:    1. Coronary artery disease due to lipid rich plaque   2. Apical variant hypertrophic cardiomyopathy ([place])   3. Essential hypertension   4. Venous insufficiency   5. Mixed hyperlipidemia    PLAN:    In order of problems listed above:  CAD s/p PCI/DES to the mid LAD Last cath in 2017 with intervention to the mid LAD, known 31 to 60% ostial LAD stenosis.  Patient denies chest pain, however she is  very active at baseline.  Pharmacist recommended she stop aspirin due to bleeding.  Given her CAD history, I recommended she resume aspirin 81 mg daily.  Patient has statin intolerance.  She cannot afford PCSK9 inhibitors. I will update a CBC.  Apical variant hypertrophic cardiomyopathy Cardiac MRI from 2012 showed no infiltrative disease and patchy diffuse hyperenhancement in the LV apex, consistent with scar from apical hypotrophic cardiomyopathy.  Echo from 2018 showed moderate to severe LVH, LVEF 55 to 12%, grade 1 diastolic dysfunction.  I will update echo today.  Hypertension Blood pressure elevated, repeat 140/80.  Patient reports blood pressure at home is normal.  Continue metoprolol 25 mg twice daily.  She will continue to check blood pressure at home.  Venous insufficiency Stable on exam.  Probable familial hyperlipidemia She has been followed by Dr. Debara Pickett for many years.  She is intolerant to statins.  She cannot afford PCSK9 inhibitors.  I will update cholesterol panel today.  Disposition: Follow up in 1 year(s) with MD    Signed, Rynn Markiewicz Ninfa Meeker, PA-C  03/14/2022 2:19 PM    [facility]

## 2022-03-29 NOTE — Progress Notes (Deleted)
Subjective:   Lauren House is a 87 y.o. female who presents for Medicare Annual (Subsequent) preventive examination.  Review of Systems    ***       Objective:    There were no vitals filed for this visit. There is no height or weight on file to calculate BMI.     11/28/2021   12:27 PM 07/30/2021    8:32 AM 03/23/2021    3:02 PM 10/03/2020   12:00 PM 12/28/2019   10:30 AM 03/17/2019    3:38 PM 01/16/2018    8:02 AM  Advanced Directives  Does Patient Have a Medical Advance Directive? Yes Yes Yes Yes Yes No Yes  Type of Paramedic of [place];Living will Healthcare Power of [place];Living will  [place]  Does patient want to make changes to medical advance directive? No - Patient declined No - Patient declined  No - Patient declined   No - Patient declined  Copy of [place] in Chart? No - copy requested No - copy requested No - copy requested No - copy requested   No - copy requested    Current Medications (verified) Outpatient Encounter Medications as of 03/29/2022  Medication Sig   alendronate (FOSAMAX) 70 MG tablet Take 1 tablet (70 mg total) by mouth every 7 (seven) days. Take with a full glass of water on an empty stomach.   aspirin EC 81 MG tablet Take 81 mg by mouth daily. (Patient not taking: Reported on 03/14/2022)   Butalbital-APAP-Caffeine 50-300-40 MG CAPS Take 1 tablet by mouth every 6 (six) hours as needed (migraine).   Cyanocobalamin (VITAMIN B12 PO) Take 1 tablet by mouth daily.   Evolocumab (REPATHA SURECLICK) [id number] MG/ML SOAJ Inject 1 Dose into the skin every 14 (fourteen) days.   fluticasone (FLONASE) 50 MCG/ACT nasal spray USE TWO SPRAY IN EACH NOSTRIL EVERY DAY   hydrocortisone cream 1 % Apply 1 application topically 2 (two) times daily.   methocarbamol (ROBAXIN-750) 750 MG tablet Take 1 tablet (750 mg  total) by mouth at bedtime as needed for muscle spasms.   metoprolol tartrate (LOPRESSOR) 25 MG tablet Take 1 tablet by mouth twice daily   Multiple Vitamins-Minerals (OCUVITE PO) Take 1 tablet by mouth daily.   nitroGLYCERIN (NITROSTAT) 0.4 MG SL tablet Place 1 tablet (0.4 mg total) under the tongue every 5 (five) minutes as needed for chest pain.   Polyethyl Glycol-Propyl Glycol (SYSTANE OP) Apply 1 drop to eye 3 (three) times daily as needed (Dry eyes).   ranolazine (RANEXA) 500 MG 12 hr tablet Take 1 tablet (500 mg total) by mouth 2 (two) times daily.   senna-docusate (SENOKOT-S) 8.6-50 MG tablet Take 1 tablet by mouth at bedtime as needed for mild constipation or moderate constipation.   No facility-administered encounter medications on file as of 03/29/2022.    Allergies (verified) Chlorhexidine, Vancomycin, Aspirin, Atorvastatin, Bactrim [sulfamethoxazole-trimethoprim], Cephalosporins, Ciprofloxacin, Clarithromycin, Colestipol hcl, Ezetimibe, Prednisone, Simvastatin, Penicillins, and Tape   History: Past Medical History:  Diagnosis Date   Anxiety    Apical variant hypertrophic cardiomyopathy ([place])    Arthritis    Borderline diabetes mellitus    Bruises easily    Bursitis of left hip    Coronary atherosclerosis of native coronary artery    a. 2011: Cath showing nonobstructive CAD - 50-60% LAD (normal FFR) b. 06/2015: Cath showing 80% stenosis in the Mid  LAD, tx w/ Promus DES 2.25 mm x 16 mm    Essential hypertension, benign    History of kidney stones    History of skin cancer    Basal cell - abdomen   Hyperlipidemia    Diet controlled.cannot take meds   Nocturia    Osteoporosis    Overactive bladder    Shortness of breath dyspnea    Vertigo    Past Surgical History:  Procedure Laterality Date   ABDOMINAL HYSTERECTOMY     CARDIAC CATHETERIZATION N/A 07/22/2015   Procedure: Left Heart Cath and Coronary Angiography;  Surgeon: Leonie Man, MD;  Location: [facility];  Service: Cardiovascular;  Laterality: N/A;   CARDIAC CATHETERIZATION N/A 07/22/2015   Procedure: Coronary Stent Intervention;  Surgeon: Leonie Man, MD;  Location: [facility];  Service: Cardiovascular;  Laterality: N/A;   CORONARY STENT PLACEMENT  07/22/2015   mid lad  des    EYE SURGERY     cataracts bilateral   HEMORRHOID SURGERY     INGUINAL HERNIA REPAIR  10/11/2011   Procedure: HERNIA REPAIR INGUINAL ADULT;  Surgeon: Stark Klein, MD;  Location: [facility];  Service: General;  Laterality: Right;   OPEN SURGICAL REPAIR OF GLUTEAL TENDON Left 03/12/2014   Procedure: LEFT HIP BURSECTOMY AND GLUTEAL TENDON REPAIR;  Surgeon: [name], MD;  Location: [facility];  Service: Orthopedics;  Laterality: Left;   Family History  Problem Relation Age of Onset   Heart disease Father    Lung cancer Sister    Lung cancer Brother    Social History   Socioeconomic History   Marital status: Widowed    Spouse name: Not on file   Number of children: 4   Years of education: 61   Highest education level: 12th grade  Occupational History   Not on file  Tobacco Use   Smoking status: Never    Passive exposure: Never   Smokeless tobacco: Never  Vaping Use   Vaping Use: Never used  Substance and Sexual Activity   Alcohol use: No   Drug use: No   Sexual activity: Not Currently  Other Topics Concern   Not on file  Social History Narrative   3 sons, 1 daughter. 1 son died in 2016-05-21 (unexpectedly)   Husband died in May 22, 2014.   6 grandchildren    9 great grandchildren   Social Determinants of Health   Financial Resource Strain: Low Risk  (11/28/2021)   Overall Financial Resource Strain (CARDIA)    Difficulty of Paying Living Expenses: Not hard at all  Food Insecurity: No Food Insecurity (11/28/2021)   Hunger Vital Sign    Worried About Running Out of Food in the Last Year: Never true    Ran Out of Food in the Last Year: Never true  Transportation Needs: No Transportation Needs  (11/28/2021)   PRAPARE - Hydrologist (Medical): No    Lack of Transportation (Non-Medical): No  Physical Activity: Sufficiently Active (11/28/2021)   Exercise Vital Sign    Days of Exercise per Week: 5 days    Minutes of Exercise per Session: 60 min  Stress: No Stress Concern Present (11/28/2021)   [place]    Feeling of Stress : Not at all  Social Connections: Moderately Integrated (11/28/2021)   Social Connection and Isolation Panel [NHANES]    Frequency of Communication with Friends and Family: More than three  times a week    Frequency of Social Gatherings with Friends and Family: More than three times a week    Attends Religious Services: More than 4 times per year    Active Member of Clubs or Organizations: Yes    Attends Archivist Meetings: More than 4 times per year    Marital Status: Widowed    Tobacco Counseling Counseling given: Not Answered   Clinical Intake:                 Diabetic?No          Activities of Daily Living    11/28/2021   12:26 PM  In your present state of health, do you have any difficulty performing the following activities:  Hearing? 0  Vision? 1  Comment Macular Degeneration  Difficulty concentrating or making decisions? 0  Walking or climbing stairs? 1  Comment Macular Degeneration  Dressing or bathing? 0  Doing errands, shopping? 1  Comment Macular Degeneration  Preparing Food and eating ? N  Using the Toilet? N  In the past six months, have you accidently leaked urine? N  Do you have problems with loss of bowel control? N  Managing your Medications? Y  Comment Family Assists  Managing your Finances? Y  Comment Family Assists  Housekeeping or managing your Housekeeping? Y  Comment Family Assists    Patient Care Team: Susy Frizzle, MD as PCP - General (Family Medicine) Dennard Schaumann Cammie Mcgee, MD (Family  Medicine)  Indicate any recent Medical Services you may have received from other than [facility] providers in the past year (date may be approximate).     Assessment:   This is a routine wellness examination for Lauren House.  Hearing/Vision screen No results found.  Dietary issues and exercise activities discussed:     Goals Addressed   None    Depression Screen    11/28/2021   12:23 PM 11/27/2021   11:49 AM 07/28/2021    9:13 AM 03/23/2021    2:55 PM 10/03/2020   12:19 PM 10/03/2020   11:54 AM 12/28/2019   10:31 AM  PHQ 2/9 Scores  PHQ - 2 Score 0 0 0 0 0 0 0    Fall Risk    11/28/2021   12:25 PM 07/28/2021    9:13 AM 03/23/2021    3:07 PM 10/03/2020   11:53 AM 12/28/2019   10:31 AM  [place] in the past year? 0 0 0 0 0  Number falls in past yr: 0  0  0  Injury with Fall? 0  0  0  Risk for fall due to : Impaired vision  No Fall Risks    Follow up Falls evaluation completed;Education provided;Falls prevention discussed Falls evaluation completed Falls prevention discussed      FALL RISK PREVENTION PERTAINING TO THE HOME:  Any stairs in or around the home? {YES/NO:21197} If so, are there any without handrails? {YES/NO:21197} Home free of loose throw rugs in walkways, pet beds, electrical cords, etc? {YES/NO:21197} Adequate lighting in your home to reduce risk of falls? {YES/NO:21197}  ASSISTIVE DEVICES UTILIZED TO PREVENT FALLS:  Life alert? {YES/NO:21197} Use of a cane, walker or w/c? {YES/NO:21197} Grab bars in the bathroom? {YES/NO:21197} Shower chair or bench in shower? {YES/NO:21197} Elevated toilet seat or a handicapped toilet? {YES/NO:21197}  TIMED UP AND GO:  Was the test performed? {YES/NO:21197}.  Length of time to ambulate 10 feet: *** sec.   {Appearance of GA:[id number]  Cognitive  Function:        03/23/2021    3:12 PM 12/28/2019   10:32 AM  6CIT Screen  What Year? 0 points 0 points  What month? 0 points 0 points  What time? 0 points 0 points   Count back from 20 0 points 0 points  Months in reverse 0 points 0 points  Repeat phrase 2 points 0 points  Total Score 2 points 0 points    Immunizations Immunization History  Administered Date(s) Administered   Fluad Quad(high Dose 65+) 12/23/2018, 12/28/2019, 11/30/2020   Influenza Whole 12/25/2005, 12/26/2006   Influenza, High Dose Seasonal PF 12/03/2017   Influenza,inj,Quad PF,6+ Mos 11/27/2012, 12/08/2013, 11/18/2014, 11/01/2015, 11/05/2016   Influenza,inj,quad, With Preservative 12/12/2017   Influenza-Unspecified 12/12/2017   Moderna Sars-Covid-2 Vaccination 04/23/2019, 05/22/2019   Pneumococcal Conjugate-13 11/02/2015   Pneumococcal Polysaccharide-23 12/23/2012   Td 06/27/2003   Td (Adult), 2 Lf Tetanus Toxid, Preservative Free 06/27/2003    TDAP status: Due, Education has been provided regarding the importance of this vaccine. Advised may receive this vaccine at local pharmacy or Health Dept. Aware to provide a copy of the vaccination record if obtained from local pharmacy or Health Dept. Verbalized acceptance and understanding.  Flu Vaccine status: Up to date  Pneumococcal vaccine status: Up to date  Covid-19 vaccine status: Information provided on how to obtain vaccines.   Qualifies for Shingles Vaccine? Yes   Zostavax completed No   Shingrix Completed?: No.    Education has been provided regarding the importance of this vaccine. Patient has been advised to call insurance company to determine out of pocket expense if they have not yet received this vaccine. Advised may also receive vaccine at local pharmacy or Health Dept. Verbalized acceptance and understanding.  Screening Tests Health Maintenance  Topic Date Due   Zoster Vaccines- Shingrix (1 of 2) Never done   DTaP/Tdap/Td (2 - Tdap) 06/26/2013   COVID-19 Vaccine (3 - Moderna risk series) 06/19/2019   INFLUENZA VACCINE  09/26/2021   Medicare Annual Wellness (AWV)  03/23/2022   Pneumonia Vaccine 16+ Years old   Completed   DEXA SCAN  Completed   HPV VACCINES  Aged Out    Health Maintenance  Health Maintenance Due  Topic Date Due   Zoster Vaccines- Shingrix (1 of 2) Never done   DTaP/Tdap/Td (2 - Tdap) 06/26/2013   COVID-19 Vaccine (3 - Moderna risk series) 06/19/2019   INFLUENZA VACCINE  09/26/2021   Medicare Annual Wellness (AWV)  03/23/2022    Colorectal cancer screening: No longer required.   Mammogram status: No longer required due to age.  Bone Density status: Completed 11/15/16. Results reflect: Bone density results: OSTEOPOROSIS. Repeat every 2 years.  Lung Cancer Screening: (Low Dose CT Chest recommended if Age 38-80 years, 30 pack-year currently smoking OR have quit w/in 15years.) does not qualify.   Lung Cancer Screening Referral: n/a   Additional Screening:  Hepatitis C Screening: does not qualify  Vision Screening: Recommended annual ophthalmology exams for early detection of glaucoma and other disorders of the eye. Is the patient up to date with their annual eye exam?  {YES/NO:21197} Who is the provider or what is the name of the office in which the patient attends annual eye exams? *** If pt is not established with a provider, would they like to be referred to a provider to establish care? {YES/NO:21197}.   Dental Screening: Recommended annual dental exams for proper oral hygiene  Community Resource Referral / Chronic Care Management: CRR required  this visit?  {YES/NO:21197}  CCM required this visit?  {YES/NO:21197}     Plan:     I have personally reviewed and noted the following in the patient's chart:   Medical and social history Use of alcohol, tobacco or illicit drugs  Current medications and supplements including opioid prescriptions. {Opioid [phone]} Functional ability and status Nutritional status Physical activity Advanced directives List of other physicians Hospitalizations, surgeries, and ER visits in previous 12  months Vitals Screenings to include cognitive, depression, and falls Referrals and appointments  In addition, I have reviewed and discussed with patient certain preventive protocols, quality metrics, and best practice recommendations. A written personalized care plan for preventive services as well as general preventive health recommendations were provided to patient.     Denman [place] [address], [place]   [id number]   Nurse Notes: ***

## 2022-03-29 NOTE — Patient Instructions (Incomplete)
[name] , Thank you for taking time to come for your Medicare Wellness Visit. I appreciate your ongoing commitment to your health goals. Please review the following plan we discussed and let me know if I can assist you in the future.   These are the goals we discussed:  Goals      Exercise 3x per week (30 min per time)     Continue to exercise and stay healthy.        This is a list of the screening recommended for you and due dates:  Health Maintenance  Topic Date Due   Zoster (Shingles) Vaccine (1 of 2) Never done   DTaP/Tdap/Td vaccine (2 - Tdap) 06/26/2013   COVID-19 Vaccine (3 - Moderna risk series) 06/19/2019   Flu Shot  09/26/2021   Medicare Annual Wellness Visit  03/23/2022   Pneumonia Vaccine  Completed   DEXA scan (bone density measurement)  Completed   HPV Vaccine  Aged Out    Advanced directives: ***  Conditions/risks identified: Aim for 30 minutes of exercise or brisk walking, 6-8 glasses of water, and 5 servings of fruits and vegetables each day.   Next appointment: Follow up in one year for your annual wellness visit    Preventive Care 65 Years and Older, Female Preventive care refers to lifestyle choices and visits with your health care provider that can promote health and wellness. What does preventive care include? A yearly physical exam. This is also called an annual well check. Dental exams once or twice a year. Routine eye exams. Ask your health care provider how often you should have your eyes checked. Personal lifestyle choices, including: Daily care of your teeth and gums. Regular physical activity. Eating a healthy diet. Avoiding tobacco and drug use. Limiting alcohol use. Practicing safe sex. Taking low-dose aspirin every day. Taking vitamin and mineral supplements as recommended by your health care provider. What happens during an annual well check? The services and screenings done by your health care provider during your annual well check  will depend on your age, overall health, lifestyle risk factors, and family history of disease. Counseling  Your health care provider may ask you questions about your: Alcohol use. Tobacco use. Drug use. Emotional well-being. Home and relationship well-being. Sexual activity. Eating habits. History of falls. Memory and ability to understand (cognition). Work and work Statistician. Reproductive health. Screening  You may have the following tests or measurements: Height, weight, and BMI. Blood pressure. Lipid and cholesterol levels. These may be checked every 5 years, or more frequently if you are over 41 years old. Skin check. Lung cancer screening. You may have this screening every year starting at age 25 if you have a 30-pack-year history of smoking and currently smoke or have quit within the past 15 years. Fecal occult blood test (FOBT) of the stool. You may have this test every year starting at age 76. Flexible sigmoidoscopy or colonoscopy. You may have a sigmoidoscopy every 5 years or a colonoscopy every 10 years starting at age 70. Hepatitis C blood test. Hepatitis B blood test. Sexually transmitted disease (STD) testing. Diabetes screening. This is done by checking your blood sugar (glucose) after you have not eaten for a while (fasting). You may have this done every 1-3 years. Bone density scan. This is done to screen for osteoporosis. You may have this done starting at age 53. Mammogram. This may be done every 1-2 years. Talk to your health care provider about how often you should  have regular mammograms. Talk with your health care provider about your test results, treatment options, and if necessary, the need for more tests. Vaccines  Your health care provider may recommend certain vaccines, such as: Influenza vaccine. This is recommended every year. Tetanus, diphtheria, and acellular pertussis (Tdap, Td) vaccine. You may need a Td booster every 10 years. Zoster vaccine. You  may need this after age 41. Pneumococcal 13-valent conjugate (PCV13) vaccine. One dose is recommended after age 42. Pneumococcal polysaccharide (PPSV23) vaccine. One dose is recommended after age 16. Talk to your health care provider about which screenings and vaccines you need and how often you need them. This information is not intended to replace advice given to you by your health care provider. Make sure you discuss any questions you have with your health care provider. Document Released: 03/11/2015 Document Revised: 11/02/2015 Document Reviewed: 12/14/2014 Elsevier Interactive Patient Education  2017 [place] Prevention in the Home Falls can cause injuries. They can happen to people of all ages. There are many things you can do to make your home safe and to help prevent falls. What can I do on the outside of my home? Regularly fix the edges of walkways and driveways and fix any cracks. Remove anything that might make you trip as you walk through a door, such as a raised step or threshold. Trim any bushes or trees on the path to your home. Use bright outdoor lighting. Clear any walking paths of anything that might make someone trip, such as rocks or tools. Regularly check to see if handrails are loose or broken. Make sure that both sides of any steps have handrails. Any raised decks and porches should have guardrails on the edges. Have any leaves, snow, or ice cleared regularly. Use sand or salt on walking paths during winter. Clean up any spills in your garage right away. This includes oil or grease spills. What can I do in the bathroom? Use night lights. Install grab bars by the toilet and in the tub and shower. Do not use towel bars as grab bars. Use non-skid mats or decals in the tub or shower. If you need to sit down in the shower, use a plastic, non-slip stool. Keep the floor dry. Clean up any water that spills on the floor as soon as it happens. Remove soap buildup  in the tub or shower regularly. Attach bath mats securely with double-sided non-slip rug tape. Do not have throw rugs and other things on the floor that can make you trip. What can I do in the bedroom? Use night lights. Make sure that you have a light by your bed that is easy to reach. Do not use any sheets or blankets that are too big for your bed. They should not hang down onto the floor. Have a firm chair that has side arms. You can use this for support while you get dressed. Do not have throw rugs and other things on the floor that can make you trip. What can I do in the kitchen? Clean up any spills right away. Avoid walking on wet floors. Keep items that you use a lot in easy-to-reach places. If you need to reach something above you, use a strong step stool that has a grab bar. Keep electrical cords out of the way. Do not use floor polish or wax that makes floors slippery. If you must use wax, use non-skid floor wax. Do not have throw rugs and other things on the floor  that can make you trip. What can I do with my stairs? Do not leave any items on the stairs. Make sure that there are handrails on both sides of the stairs and use them. Fix handrails that are broken or loose. Make sure that handrails are as long as the stairways. Check any carpeting to make sure that it is firmly attached to the stairs. Fix any carpet that is loose or worn. Avoid having throw rugs at the top or bottom of the stairs. If you do have throw rugs, attach them to the floor with carpet tape. Make sure that you have a light switch at the top of the stairs and the bottom of the stairs. If you do not have them, ask someone to add them for you. What else can I do to help prevent falls? Wear shoes that: Do not have high heels. Have rubber bottoms. Are comfortable and fit you well. Are closed at the toe. Do not wear sandals. If you use a stepladder: Make sure that it is fully opened. Do not climb a closed  stepladder. Make sure that both sides of the stepladder are locked into place. Ask someone to hold it for you, if possible. Clearly mark and make sure that you can see: Any grab bars or handrails. First and last steps. Where the edge of each step is. Use tools that help you move around (mobility aids) if they are needed. These include: Canes. Walkers. Scooters. Crutches. Turn on the lights when you go into a dark area. Replace any light bulbs as soon as they burn out. Set up your furniture so you have a clear path. Avoid moving your furniture around. If any of your floors are uneven, fix them. If there are any pets around you, be aware of where they are. Review your medicines with your doctor. Some medicines can make you feel dizzy. This can increase your chance of falling. Ask your doctor what other things that you can do to help prevent falls. This information is not intended to replace advice given to you by your health care provider. Make sure you discuss any questions you have with your health care provider. Document Released: 12/09/2008 Document Revised: 07/21/2015 Document Reviewed: 03/19/2014 Elsevier Interactive Patient Education  2017 [employer].

## 2022-03-30 ENCOUNTER — Ambulatory Visit (HOSPITAL_COMMUNITY)
Admission: RE | Admit: 2022-03-30 | Discharge: 2022-03-30 | Disposition: A | Discharge status: Home / Self Care | Payer: Medicare Other | Source: Ambulatory Visit | Attending: Medical | Admitting: Medical

## 2022-03-30 ENCOUNTER — Other Ambulatory Visit (HOSPITAL_COMMUNITY)
Admission: RE | Admit: 2022-03-30 | Discharge: 2022-03-30 | Disposition: A | Discharge status: Home / Self Care | Payer: Medicare Other | Source: Ambulatory Visit | Attending: Medical | Admitting: Medical

## 2022-03-30 DIAGNOSIS — E782 Mixed hyperlipidemia: Secondary | ICD-10-CM | POA: Diagnosis not present

## 2022-03-30 DIAGNOSIS — I422 Other hypertrophic cardiomyopathy: Secondary | ICD-10-CM | POA: Insufficient documentation

## 2022-03-30 LAB — LIPID PANEL
Cholesterol: 300 mg/dL — ABNORMAL HIGH (ref 0–200)
HDL: 50 mg/dL (ref >40)
LDL Cholesterol: 221 mg/dL — ABNORMAL HIGH (ref 0–99)
Total CHOL/HDL Ratio: 6 RATIO
Triglycerides: 145 mg/dL (ref <150)
VLDL: 29 mg/dL (ref 0–40)

## 2022-03-30 LAB — CBC
HCT: 43 % (ref 36.0–46.0)
Hemoglobin: 13.9 g/dL (ref 12.0–15.0)
MCH: 28.7 pg (ref 26.0–34.0)
MCHC: 32.3 g/dL (ref 30.0–36.0)
MCV: 88.8 fL (ref 80.0–100.0)
Platelets: 229 10*3/uL (ref 150–400)
RBC: 4.84 MIL/uL (ref 3.87–5.11)
RDW: 13.2 % (ref 11.5–15.5)
WBC: 8.2 10*3/uL (ref 4.0–10.5)
nRBC: 0 % (ref 0.0–0.2)

## 2022-03-30 LAB — ECHOCARDIOGRAM COMPLETE
Area-P 1/2: 2.39 cm2
S' Lateral: 3.1 cm

## 2022-03-30 NOTE — Progress Notes (Signed)
*  PRELIMINARY RESULTS* Echocardiogram 2D Echocardiogram has been performed.  Lauren House 03/30/2022, 1:50 PM

## 2022-04-03 ENCOUNTER — Ambulatory Visit (INDEPENDENT_AMBULATORY_CARE_PROVIDER_SITE_OTHER): Payer: Medicare Other

## 2022-04-03 ENCOUNTER — Telehealth: Payer: Self-pay

## 2022-04-03 VITALS — BP 128/64 | Ht 67.0 in | Wt 146.0 lb

## 2022-04-03 DIAGNOSIS — Z0000 Encounter for general adult medical examination without abnormal findings: Secondary | ICD-10-CM | POA: Diagnosis not present

## 2022-04-03 NOTE — Patient Instructions (Addendum)
Lauren House , Thank you for taking time to come for your Medicare Wellness Visit. I appreciate your ongoing commitment to your health goals. Please review the following plan we discussed and let me know if I can assist you in the future.   These are the goals we discussed:  Goals      Exercise 3x per week (30 min per time)     Continue to exercise and stay healthy.        This is a list of the screening recommended for you and due dates:  Health Maintenance  Topic Date Due   Zoster (Shingles) Vaccine (1 of 2) Never done   DTaP/Tdap/Td vaccine (2 - Tdap) 06/26/2013   COVID-19 Vaccine (3 - Moderna risk series) 06/19/2019   Medicare Annual Wellness Visit  04/04/2023   Pneumonia Vaccine  Completed   Flu Shot  Completed   DEXA scan (bone density measurement)  Completed   HPV Vaccine  Aged Out    Advanced directives: Please bring a copy of your health care power of attorney and living will to the office to be added to your chart at your convenience.   Conditions/risks identified: Aim for 30 minutes of exercise or brisk walking, 6-8 glasses of water, and 5 servings of fruits and vegetables each day.   Next appointment: Follow up in one year for your annual wellness visit   I will check with Dr. Dennard Schaumann on if he wants to restart the [place] and see if we can get some assistance from the in house pharmacist on ways to get around the cost of it.    Preventive Care 85 Years and Older, Female Preventive care refers to lifestyle choices and visits with your health care provider that can promote health and wellness. What does preventive care include? A yearly physical exam. This is also called an annual well check. Dental exams once or twice a year. Routine eye exams. Ask your health care provider how often you should have your eyes checked. Personal lifestyle choices, including: Daily care of your teeth and gums. Regular physical activity. Eating a healthy diet. Avoiding tobacco and  drug use. Limiting alcohol use. Practicing safe sex. Taking low-dose aspirin every day. Taking vitamin and mineral supplements as recommended by your health care provider. What happens during an annual well check? The services and screenings done by your health care provider during your annual well check will depend on your age, overall health, lifestyle risk factors, and family history of disease. Counseling  Your health care provider may ask you questions about your: Alcohol use. Tobacco use. Drug use. Emotional well-being. Home and relationship well-being. Sexual activity. Eating habits. History of falls. Memory and ability to understand (cognition). Work and work Statistician. Reproductive health. Screening  You may have the following tests or measurements: Height, weight, and BMI. Blood pressure. Lipid and cholesterol levels. These may be checked every 5 years, or more frequently if you are over 24 years old. Skin check. Lung cancer screening. You may have this screening every year starting at age 60 if you have a 30-pack-year history of smoking and currently smoke or have quit within the past 15 years. Fecal occult blood test (FOBT) of the stool. You may have this test every year starting at age 45. Flexible sigmoidoscopy or colonoscopy. You may have a sigmoidoscopy every 5 years or a colonoscopy every 10 years starting at age 85. Hepatitis C blood test. Hepatitis B blood test. Sexually transmitted disease (STD) testing. Diabetes screening.  This is done by checking your blood sugar (glucose) after you have not eaten for a while (fasting). You may have this done every 1-3 years. Bone density scan. This is done to screen for osteoporosis. You may have this done starting at age 48. Mammogram. This may be done every 1-2 years. Talk to your health care provider about how often you should have regular mammograms. Talk with your health care provider about your test results, treatment  options, and if necessary, the need for more tests. Vaccines  Your health care provider may recommend certain vaccines, such as: Influenza vaccine. This is recommended every year. Tetanus, diphtheria, and acellular pertussis (Tdap, Td) vaccine. You may need a Td booster every 10 years. Zoster vaccine. You may need this after age 34. Pneumococcal 13-valent conjugate (PCV13) vaccine. One dose is recommended after age 69. Pneumococcal polysaccharide (PPSV23) vaccine. One dose is recommended after age 53. Talk to your health care provider about which screenings and vaccines you need and how often you need them. This information is not intended to replace advice given to you by your health care provider. Make sure you discuss any questions you have with your health care provider. Document Released: 03/11/2015 Document Revised: 11/02/2015 Document Reviewed: 12/14/2014 Elsevier Interactive Patient Education  2017 [place] Prevention in the Home Falls can cause injuries. They can happen to people of all ages. There are many things you can do to make your home safe and to help prevent falls. What can I do on the outside of my home? Regularly fix the edges of walkways and driveways and fix any cracks. Remove anything that might make you trip as you walk through a door, such as a raised step or threshold. Trim any bushes or trees on the path to your home. Use bright outdoor lighting. Clear any walking paths of anything that might make someone trip, such as rocks or tools. Regularly check to see if handrails are loose or broken. Make sure that both sides of any steps have handrails. Any raised decks and porches should have guardrails on the edges. Have any leaves, snow, or ice cleared regularly. Use sand or salt on walking paths during winter. Clean up any spills in your garage right away. This includes oil or grease spills. What can I do in the bathroom? Use night lights. Install grab  bars by the toilet and in the tub and shower. Do not use towel bars as grab bars. Use non-skid mats or decals in the tub or shower. If you need to sit down in the shower, use a plastic, non-slip stool. Keep the floor dry. Clean up any water that spills on the floor as soon as it happens. Remove soap buildup in the tub or shower regularly. Attach bath mats securely with double-sided non-slip rug tape. Do not have throw rugs and other things on the floor that can make you trip. What can I do in the bedroom? Use night lights. Make sure that you have a light by your bed that is easy to reach. Do not use any sheets or blankets that are too big for your bed. They should not hang down onto the floor. Have a firm chair that has side arms. You can use this for support while you get dressed. Do not have throw rugs and other things on the floor that can make you trip. What can I do in the kitchen? Clean up any spills right away. Avoid walking on wet floors. Keep items  that you use a lot in easy-to-reach places. If you need to reach something above you, use a strong step stool that has a grab bar. Keep electrical cords out of the way. Do not use floor polish or wax that makes floors slippery. If you must use wax, use non-skid floor wax. Do not have throw rugs and other things on the floor that can make you trip. What can I do with my stairs? Do not leave any items on the stairs. Make sure that there are handrails on both sides of the stairs and use them. Fix handrails that are broken or loose. Make sure that handrails are as long as the stairways. Check any carpeting to make sure that it is firmly attached to the stairs. Fix any carpet that is loose or worn. Avoid having throw rugs at the top or bottom of the stairs. If you do have throw rugs, attach them to the floor with carpet tape. Make sure that you have a light switch at the top of the stairs and the bottom of the stairs. If you do not have them,  ask someone to add them for you. What else can I do to help prevent falls? Wear shoes that: Do not have high heels. Have rubber bottoms. Are comfortable and fit you well. Are closed at the toe. Do not wear sandals. If you use a stepladder: Make sure that it is fully opened. Do not climb a closed stepladder. Make sure that both sides of the stepladder are locked into place. Ask someone to hold it for you, if possible. Clearly mark and make sure that you can see: Any grab bars or handrails. First and last steps. Where the edge of each step is. Use tools that help you move around (mobility aids) if they are needed. These include: Canes. Walkers. Scooters. Crutches. Turn on the lights when you go into a dark area. Replace any light bulbs as soon as they burn out. Set up your furniture so you have a clear path. Avoid moving your furniture around. If any of your floors are uneven, fix them. If there are any pets around you, be aware of where they are. Review your medicines with your doctor. Some medicines can make you feel dizzy. This can increase your chance of falling. Ask your doctor what other things that you can do to help prevent falls. This information is not intended to replace advice given to you by your health care provider. Make sure you discuss any questions you have with your health care provider. Document Released: 12/09/2008 Document Revised: 07/21/2015 Document Reviewed: 03/19/2014 Elsevier Interactive Patient Education  2017 [employer].

## 2022-04-03 NOTE — Progress Notes (Signed)
Subjective:   Lauren House is a 87 y.o. female who presents for Medicare Annual (Subsequent) preventive examination.  Review of Systems     Cardiac Risk Factors include: advanced age (>50mn, >>68women);hypertension;dyslipidemia     Objective:    Today's Vitals   04/03/22 1143  BP: 128/64  Weight: 146 lb (66.2 kg)  Height: '5\' 7"'$  (1.702 m)   Body mass index is 22.87 kg/m.     04/03/2022    4:03 PM 11/28/2021   12:27 PM 07/30/2021    8:32 AM 03/23/2021    3:02 PM 10/03/2020   12:00 PM 12/28/2019   10:30 AM 03/17/2019    3:38 PM  Advanced Directives  Does Patient Have a Medical Advance Directive? Yes Yes Yes Yes Yes Yes No  Type of Advance Directive Living will [place] will Healthcare Power of [place] will   Does patient want to make changes to medical advance directive? No - Patient declined No - Patient declined No - Patient declined  No - Patient declined    Copy of [place]?  No - copy requested No - copy requested No - copy requested No - copy requested      Current Medications (verified) Outpatient Encounter Medications as of 04/03/2022  Medication Sig   alendronate (FOSAMAX) 70 MG tablet Take 1 tablet (70 mg total) by mouth every 7 (seven) days. Take with a full glass of water on an empty stomach.   aspirin EC 81 MG tablet Take 81 mg by mouth daily.   Butalbital-APAP-Caffeine 50-300-40 MG CAPS Take 1 tablet by mouth every 6 (six) hours as needed (migraine).   Cyanocobalamin (VITAMIN B12 PO) Take 1 tablet by mouth daily.   fluticasone (FLONASE) 50 MCG/ACT nasal spray USE TWO SPRAY IN EACH NOSTRIL EVERY DAY   hydrocortisone cream 1 % Apply 1 application topically 2 (two) times daily.   methocarbamol (ROBAXIN-750) 750 MG tablet Take 1 tablet (750 mg total) by mouth at bedtime as needed for muscle spasms.    metoprolol tartrate (LOPRESSOR) 25 MG tablet Take 1 tablet by mouth twice daily   Multiple Vitamins-Minerals (OCUVITE PO) Take 1 tablet by mouth daily.   nitroGLYCERIN (NITROSTAT) 0.4 MG SL tablet Place 1 tablet (0.4 mg total) under the tongue every 5 (five) minutes as needed for chest pain.   Polyethyl Glycol-Propyl Glycol (SYSTANE OP) Apply 1 drop to eye 3 (three) times daily as needed (Dry eyes).   ranolazine (RANEXA) 500 MG 12 hr tablet Take 1 tablet (500 mg total) by mouth 2 (two) times daily.   senna-docusate (SENOKOT-S) 8.6-50 MG tablet Take 1 tablet by mouth at bedtime as needed for mild constipation or moderate constipation.   Evolocumab (REPATHA SURECLICK) 1580MG/ML SOAJ Inject 1 Dose into the skin every 14 (fourteen) days. (Patient not taking: Reported on 04/03/2022)   No facility-administered encounter medications on file as of 04/03/2022.    Allergies (verified) Chlorhexidine, Vancomycin, Aspirin, Atorvastatin, Bactrim [sulfamethoxazole-trimethoprim], Cephalosporins, Ciprofloxacin, Clarithromycin, Colestipol hcl, Ezetimibe, Prednisone, Simvastatin, Penicillins, and Tape   History: Past Medical History:  Diagnosis Date   Anxiety    Apical variant hypertrophic cardiomyopathy (HCC)    Arthritis    Borderline diabetes mellitus    Bruises easily    Bursitis of left hip    Coronary atherosclerosis of native coronary artery    a. 2011: Cath showing nonobstructive CAD - 50-60%  LAD (normal FFR) b. 06/2015: Cath showing 80% stenosis in the Mid LAD, tx w/ Promus DES 2.25 mm x 16 mm    Essential hypertension, benign    History of kidney stones    History of skin cancer    Basal cell - abdomen   Hyperlipidemia    Diet controlled.cannot take meds   Nocturia    Osteoporosis    Overactive bladder    Shortness of breath dyspnea    Vertigo    Past Surgical History:  Procedure Laterality Date   ABDOMINAL HYSTERECTOMY     CARDIAC CATHETERIZATION N/A 07/22/2015   Procedure: Left Heart  Cath and Coronary Angiography;  Surgeon: Leonie Man, MD;  Location: [facility];  Service: Cardiovascular;  Laterality: N/A;   CARDIAC CATHETERIZATION N/A 07/22/2015   Procedure: Coronary Stent Intervention;  Surgeon: Leonie Man, MD;  Location: [facility];  Service: Cardiovascular;  Laterality: N/A;   CORONARY STENT PLACEMENT  07/22/2015   mid lad  des    EYE SURGERY     cataracts bilateral   HEMORRHOID SURGERY     INGUINAL HERNIA REPAIR  10/11/2011   Procedure: HERNIA REPAIR INGUINAL ADULT;  Surgeon: Stark Klein, MD;  Location: [facility];  Service: General;  Laterality: Right;   OPEN SURGICAL REPAIR OF GLUTEAL TENDON Left 03/12/2014   Procedure: LEFT HIP BURSECTOMY AND GLUTEAL TENDON REPAIR;  Surgeon: [name], MD;  Location: [facility];  Service: Orthopedics;  Laterality: Left;   Family History  Problem Relation Age of Onset   Heart disease Father    Lung cancer Sister    Lung cancer Brother    Social History   Socioeconomic History   Marital status: Widowed    Spouse name: Not on file   Number of children: 4   Years of education: 77   Highest education level: 12th grade  Occupational History   Not on file  Tobacco Use   Smoking status: Never    Passive exposure: Never   Smokeless tobacco: Never  Vaping Use   Vaping Use: Never used  Substance and Sexual Activity   Alcohol use: No   Drug use: No   Sexual activity: Not Currently  Other Topics Concern   Not on file  Social History Narrative   3 sons, 1 daughter. 1 son died in May 15, 2016 (unexpectedly)   Husband died in 2014/05/16.   6 grandchildren    67 great grandchildren   Social Determinants of Health   Financial Resource Strain: Low Risk  (04/03/2022)   Overall Financial Resource Strain (CARDIA)    Difficulty of Paying Living Expenses: Not hard at all  Food Insecurity: No Food Insecurity (04/03/2022)   Hunger Vital Sign    Worried About Running Out of Food in the Last Year: Never true    Ran Out of  Food in the Last Year: Never true  Transportation Needs: No Transportation Needs (04/03/2022)   PRAPARE - Hydrologist (Medical): No    Lack of Transportation (Non-Medical): No  Physical Activity: Sufficiently Active (04/03/2022)   Exercise Vital Sign    Days of Exercise per Week: 5 days    Minutes of Exercise per Session: 30 min  Stress: No Stress Concern Present (04/03/2022)   [place]    Feeling of Stress : Not at all  Social Connections: Moderately Integrated (04/03/2022)   Social Connection and Isolation Panel [NHANES]  Frequency of Communication with Friends and Family: More than three times a week    Frequency of Social Gatherings with Friends and Family: Three times a week    Attends Religious Services: More than 4 times per year    Active Member of Clubs or Organizations: Yes    Attends Archivist Meetings: More than 4 times per year    Marital Status: Widowed    Tobacco Counseling Counseling given: Not Answered   Clinical Intake:  Pre-visit preparation completed: Yes  Pain : No/denies pain  Diabetes: No  How often do you need to have someone help you when you read instructions, pamphlets, or other written materials from your doctor or pharmacy?: 1 - Never  Diabetic?No   Interpreter Needed?: No  Information entered by :: Denman George LPN   Activities of Daily Living    04/03/2022    4:03 PM 11/28/2021   12:26 PM  In your present state of health, do you have any difficulty performing the following activities:  Hearing? 0 0  Vision? 0 1  Comment  Macular Degeneration  Difficulty concentrating or making decisions? 0 0  Walking or climbing stairs? 0 1  Comment  Macular Degeneration  Dressing or bathing? 0 0  Doing errands, shopping? 0 1  Comment  Macular Degeneration  Preparing Food and eating ? N N  Using the Toilet? N N  In the past six months, have you  accidently leaked urine? N N  Do you have problems with loss of bowel control? N N  Managing your Medications? N Y  Comment  Family Assists  Managing your Finances? N Y  Comment  Family Assists  Housekeeping or managing your Housekeeping? Aggie Moats  Comment  Family Assists    Patient Care Team: Susy Frizzle, MD as PCP - General (Family Medicine) Dennard Schaumann Cammie Mcgee, MD (Family Medicine)  Indicate any recent Medical Services you may have received from other than [facility] providers in the past year (date may be approximate).     Assessment:   This is a routine wellness examination for Ellen.  Hearing/Vision screen Hearing Screening - Comments:: Denies hearing difficulties   Vision Screening - Comments:: Wears rx glasses - up to date with routine eye exams with Dr. Jorja Loa     Dietary issues and exercise activities discussed: Current Exercise Habits: Home exercise routine, Type of exercise: walking, Time (Minutes): 30, Frequency (Times/Week): 5, Weekly Exercise (Minutes/Week): 150, Intensity: Mild   Goals Addressed   None    Depression Screen    04/03/2022    1:49 PM 11/28/2021   12:23 PM 11/27/2021   11:49 AM 07/28/2021    9:13 AM 03/23/2021    2:55 PM 10/03/2020   12:19 PM 10/03/2020   11:54 AM  PHQ 2/9 Scores  PHQ - 2 Score 0 0 0 0 0 0 0    Fall Risk    04/03/2022   12:09 PM 11/28/2021   12:25 PM 07/28/2021    9:13 AM 03/23/2021    3:07 PM 10/03/2020   11:53 AM  Fall Risk   Falls in the past year? 1 0 0 0 0  Number falls in past yr: 0 0  0   Injury with Fall? 1 0  0   Risk for fall due to : History of fall(s) Impaired vision  No Fall Risks   Follow up Falls evaluation completed;Education provided;Falls prevention discussed Falls evaluation completed;Education provided;Falls prevention discussed Falls evaluation completed Falls prevention discussed  FALL RISK PREVENTION PERTAINING TO THE HOME:  Any stairs in or around the home? No  If so, are there any without handrails? No   Home free of loose throw rugs in walkways, pet beds, electrical cords, etc? Yes  Adequate lighting in your home to reduce risk of falls? Yes   ASSISTIVE DEVICES UTILIZED TO PREVENT FALLS:  Life alert? No  Use of a cane, walker or w/c? No  Grab bars in the bathroom? Yes  Shower chair or bench in shower? No  Elevated toilet seat or a handicapped toilet? Yes   TIMED UP AND GO:  Was the test performed? Yes .  Length of time to ambulate 10 feet: 7 sec.   Gait slow and steady without use of assistive device  Cognitive Function:        04/03/2022    4:03 PM 03/23/2021    3:12 PM 12/28/2019   10:32 AM  6CIT Screen  What Year? 0 points 0 points 0 points  What month? 0 points 0 points 0 points  What time? 0 points 0 points 0 points  Count back from 20 0 points 0 points 0 points  Months in reverse 0 points 0 points 0 points  Repeat phrase 0 points 2 points 0 points  Total Score 0 points 2 points 0 points    Immunizations Immunization History  Administered Date(s) Administered   Fluad Quad(high Dose 65+) 12/23/2018, 12/28/2019, 11/30/2020, 11/27/2021   Influenza Whole 12/25/2005, 12/26/2006   Influenza, High Dose Seasonal PF 12/03/2017   Influenza,inj,Quad PF,6+ Mos 11/27/2012, 12/08/2013, 11/18/2014, 11/01/2015, 11/05/2016   Influenza,inj,quad, With Preservative 12/12/2017   Influenza-Unspecified 12/12/2017   Moderna Sars-Covid-2 Vaccination 04/23/2019, 05/22/2019   Pneumococcal Conjugate-13 11/02/2015   Pneumococcal Polysaccharide-23 12/23/2012   Td 06/27/2003   Td (Adult), 2 Lf Tetanus Toxid, Preservative Free 06/27/2003    TDAP status: Due, Education has been provided regarding the importance of this vaccine. Advised may receive this vaccine at local pharmacy or Health Dept. Aware to provide a copy of the vaccination record if obtained from local pharmacy or Health Dept. Verbalized acceptance and understanding.  Flu Vaccine status: Up to date  Pneumococcal vaccine  status: Up to date  Covid-19 vaccine status: Information provided on how to obtain vaccines.   Qualifies for Shingles Vaccine? Yes   Zostavax completed No   Shingrix Completed?: No.    Education has been provided regarding the importance of this vaccine. Patient has been advised to call insurance company to determine out of pocket expense if they have not yet received this vaccine. Advised may also receive vaccine at local pharmacy or Health Dept. Verbalized acceptance and understanding.  Screening Tests Health Maintenance  Topic Date Due   Zoster Vaccines- Shingrix (1 of 2) Never done   DTaP/Tdap/Td (2 - Tdap) 06/26/2013   COVID-19 Vaccine (3 - Moderna risk series) 06/19/2019   Medicare Annual Wellness (AWV)  04/04/2023   Pneumonia Vaccine 86+ Years old  Completed   INFLUENZA VACCINE  Completed   DEXA SCAN  Completed   HPV VACCINES  Aged Out    Health Maintenance  Health Maintenance Due  Topic Date Due   Zoster Vaccines- Shingrix (1 of 2) Never done   DTaP/Tdap/Td (2 - Tdap) 06/26/2013   COVID-19 Vaccine (3 - Moderna risk series) 06/19/2019    Colorectal cancer screening: No longer required.   Mammogram status: No longer required due to age.  Bone Density status: Completed 11/15/16. Results reflect: Bone density results: OSTEOPOROSIS. Repeat every  2 years.  Lung Cancer Screening: (Low Dose CT Chest recommended if Age 80-80 years, 30 pack-year currently smoking OR have quit w/in 15years.) does not qualify.   Lung Cancer Screening Referral: n/a   Additional Screening:  Hepatitis C Screening: does not qualify  Vision Screening: Recommended annual ophthalmology exams for early detection of glaucoma and other disorders of the eye. Is the patient up to date with their annual eye exam?  Yes  Who is the provider or what is the name of the office in which the patient attends annual eye exams? Dr. Jorja Loa  If pt is not established with a provider, would they like to be referred to  a provider to establish care? No .   Dental Screening: Recommended annual dental exams for proper oral hygiene  Community Resource Referral / Chronic Care Management: CRR required this visit?  No   CCM required this visit?  No      Plan:     I have personally reviewed and noted the following in the patient's chart:   Medical and social history Use of alcohol, tobacco or illicit drugs  Current medications and supplements including opioid prescriptions. Patient is not currently taking opioid prescriptions. Functional ability and status Nutritional status Physical activity Advanced directives List of other physicians Hospitalizations, surgeries, and ER visits in previous 12 months Vitals Screenings to include cognitive, depression, and falls Referrals and appointments  In addition, I have reviewed and discussed with patient certain preventive protocols, quality metrics, and best practice recommendations. A written personalized care plan for preventive services as well as general preventive health recommendations were provided to patient.     Denman George [place], [place]   09/02/4126   Nurse Notes: Patient is aware of recent Lipid results and is willing to restart [place] but she also does not have a Part D plan for her medications.  Please advise.

## 2022-04-03 NOTE — Telephone Encounter (Signed)
Patient seen for AWV and is aware of recent Lipid results and is willing to restart [place] but she also does not have a Part D plan for her medications.  Please advise.

## 2022-04-04 NOTE — Telephone Encounter (Signed)
Patient returned missed call. Please advise at [phone].

## 2022-04-05 ENCOUNTER — Telehealth: Payer: Self-pay

## 2022-04-05 NOTE — Telephone Encounter (Signed)
Pt came into office to drop off ppw to be completed by pcp for patient assistance for a medication. Form place in nurse's folder. Pt will be informed when ppw has been completed and faxed. Please advise.  Cb#;  [phone]

## 2022-04-05 NOTE — Telephone Encounter (Signed)
Left message for patient to return call.

## 2022-04-10 ENCOUNTER — Other Ambulatory Visit: Payer: Medicare Other

## 2022-04-10 DIAGNOSIS — E119 Type 2 diabetes mellitus without complications: Secondary | ICD-10-CM | POA: Diagnosis not present

## 2022-04-10 DIAGNOSIS — R42 Dizziness and giddiness: Secondary | ICD-10-CM | POA: Diagnosis not present

## 2022-04-11 ENCOUNTER — Other Ambulatory Visit: Payer: Self-pay | Admitting: Internal Medicine

## 2022-04-11 LAB — HEMOGLOBIN A1C
Hgb A1c MFr Bld: 6.3 % of total Hgb — ABNORMAL HIGH (ref <5.7)
Mean Plasma Glucose: 134 mg/dL
eAG (mmol/L): 7.4 mmol/L

## 2022-04-11 LAB — COMPLETE METABOLIC PANEL WITH GFR
AG Ratio: 1.4 (calc) (ref 1.0–2.5)
ALT: 8 U/L (ref 6–29)
AST: 14 U/L (ref 10–35)
Albumin: 4.1 g/dL (ref 3.6–5.1)
Alkaline phosphatase (APISO): 53 U/L (ref 37–153)
BUN: 17 mg/dL (ref 7–25)
CO2: 29 mmol/L (ref 20–32)
Calcium: 9.4 mg/dL (ref 8.6–10.4)
Chloride: 102 mmol/L (ref 98–110)
Creat: 0.92 mg/dL (ref 0.60–0.95)
Globulin: 3 g/dL (calc) (ref 1.9–3.7)
Glucose, Bld: 113 mg/dL — ABNORMAL HIGH (ref 65–99)
Potassium: 4.7 mmol/L (ref 3.5–5.3)
Sodium: 139 mmol/L (ref 135–146)
Total Bilirubin: 0.5 mg/dL (ref 0.2–1.2)
Total Protein: 7.1 g/dL (ref 6.1–8.1)
eGFR: 61 mL/min/{1.73_m2} (ref >=60)

## 2022-05-14 DIAGNOSIS — C4441 Basal cell carcinoma of skin of scalp and neck: Secondary | ICD-10-CM | POA: Diagnosis not present

## 2022-05-14 DIAGNOSIS — L570 Actinic keratosis: Secondary | ICD-10-CM | POA: Diagnosis not present

## 2022-05-14 DIAGNOSIS — D485 Neoplasm of uncertain behavior of skin: Secondary | ICD-10-CM | POA: Diagnosis not present

## 2022-05-18 DIAGNOSIS — M25561 Pain in right knee: Secondary | ICD-10-CM | POA: Diagnosis not present

## 2022-05-18 DIAGNOSIS — M79661 Pain in right lower leg: Secondary | ICD-10-CM | POA: Diagnosis not present

## 2022-05-24 DIAGNOSIS — C4441 Basal cell carcinoma of skin of scalp and neck: Secondary | ICD-10-CM | POA: Diagnosis not present

## 2022-06-14 DIAGNOSIS — M1711 Unilateral primary osteoarthritis, right knee: Secondary | ICD-10-CM | POA: Diagnosis not present

## 2022-07-17 DIAGNOSIS — H5203 Hypermetropia, bilateral: Secondary | ICD-10-CM | POA: Diagnosis not present

## 2022-07-19 ENCOUNTER — Ambulatory Visit: Payer: Medicare Other | Attending: Internal Medicine | Admitting: Internal Medicine

## 2022-07-19 ENCOUNTER — Encounter: Payer: Self-pay | Admitting: Internal Medicine

## 2022-07-19 VITALS — BP 144/80 | HR 76 | Ht 67.0 in | Wt 145.0 lb

## 2022-07-19 DIAGNOSIS — I422 Other hypertrophic cardiomyopathy: Secondary | ICD-10-CM | POA: Diagnosis not present

## 2022-07-19 DIAGNOSIS — E7849 Other hyperlipidemia: Secondary | ICD-10-CM

## 2022-07-19 DIAGNOSIS — I2583 Coronary atherosclerosis due to lipid rich plaque: Secondary | ICD-10-CM

## 2022-07-19 DIAGNOSIS — I872 Venous insufficiency (chronic) (peripheral): Secondary | ICD-10-CM | POA: Diagnosis not present

## 2022-07-19 DIAGNOSIS — I2510 Atherosclerotic heart disease of native coronary artery without angina pectoris: Secondary | ICD-10-CM

## 2022-07-19 DIAGNOSIS — I10 Essential (primary) hypertension: Secondary | ICD-10-CM

## 2022-07-19 NOTE — Patient Instructions (Signed)
Medication Instructions:  Your physician has recommended you make the following change in your medication:   -Stop Repatha  *If you need a refill on your cardiac medications before your next appointment, please call your pharmacy*   Lab Work: None If you have labs (blood work) drawn today and your tests are completely normal, you will receive your results only by: MyChart Message (if you have MyChart) OR A paper copy in the mail If you have any lab test that is abnormal or we need to change your treatment, we will call you to review the results.   Testing/Procedures: None   Follow-Up: At [facility], you and your health needs are our priority.  As part of our continuing mission to provide you with exceptional heart care, we have created designated Provider Care Teams.  These Care Teams include your primary Cardiologist (physician) and Advanced Practice Providers (APPs -  Physician Assistants and Nurse Practitioners) who all work together to provide you with the care you need, when you need it.  We recommend signing up for the patient portal called "MyChart".  Sign up information is provided on this After Visit Summary.  MyChart is used to connect with patients for Virtual Visits (Telemedicine).  Patients are able to view lab/test results, encounter notes, upcoming appointments, etc.  Non-urgent messages can be sent to your provider as well.   To learn more about what you can do with MyChart, go to [email or web address].    Your next appointment:   1 year(s)  Provider:   Zoila Shutter, MD/ APP     Other Instructions

## 2022-07-19 NOTE — Progress Notes (Signed)
OFFICE NOTE  Chief Complaint:  Follow-up dyslipidemia  Primary Care Physician: Donita Brooks, MD  HPI:  Lauren House is a pleasant 87 year old female I have been following for history of apical hypertrophic cardiomyopathy for which she has done well on Ranexa 500 mg daily. Recently, she was having right inguinal pain and had a hernia for which she had repair. That, unfortunately, she says has caused her more harm than good and she continues to have more pain in her right hip area which possibly is related to the hip. She saw Dr. Lequita Halt at [facility] for further evaluation from that and has received 2 hip injections which have been beneficial. He did mention that he felt she might need a hip replacement. Recently she had a recurrent episode of chest pain. This caused her to the hospital and improved with nitroglycerin. She was ruled out for MI and discharged. Unfortunately she returned several days later after she developed swelling, warmth and pain in her left forearm. It was felt that she might have possibly had an infected IV site and clearly had superficial thrombophlebitis. She was placed on antibiotics for this and is being treated with some improvement in her symptoms. After her emergency room visit, it was recommended that she increase her Ranexa 500 mg twice daily which is the recommended starting dose. She's had no further chest pain episodes. She had prior cardiac catheterization in 2011 which demonstrated a 50-60% LAD lesion. She underwent FFR for this which was negative with an FFR of 0.99. She had had a stress test at that time which indicated no evidence of reversible ischemia., but was having persistent chest pain.  It is been felt that her chest pain was due to small vessel disease in the setting of significant apical variant hypertrophic cardiomyopathy. This has been treated most effectively with ranolazine, and nitrates to some extent. Although her symptoms now have  escalated. This is concerning to me for possible worsening of her LAD disease.  Lauren House underwent a nuclear stress test on 02/03/2013. This was negative for ischemia with an EF of 52%. Since then she has reported some improvement in her symptoms. Her main complaints now have to do with left hip pain.  She is still contemplating surgery. She reported that her husband was recently diagnosed with esophageal cancer and it seems to be quite advanced. There did not appear to be any surgical options. This has caused her significant anxiety but she reports no chest pain.  Lauren House returns today for follow-up. Unfortunately she mentioned that her husband died about 3 months ago. He was diagnosed with esophageal cancer which was aggressive and inoperable. She is still grieving which is to be expected. She continues to have problems with her hip and at this point apparently will need hip surgery, but probably not hip replacement. That is scheduled about one month from now. She does have a history of moderate LAD stenosis in 2011 with a negative stress test one year ago. Recently, she has had worsening shortness of breath and chest pressure with exertion. She initially felt this was due to stress however her symptoms have been somewhat progressive and concern me for ischemia. Her EKG shows stable T-wave inversions consistent with her hypertrophic cardiomyopathy.  I saw Lauren House back in the office today. Overall she feels like she is doing well. She did have some mild chest discomfort last week and took a nitroglycerin. This is when she was in [place] with her son and  daughter-in-law and they were walking all over town. She managed to walk for several hours without any significant shortness of breath but did have some mild chest discomfort which was relieved fairly quickly with nitroglycerin. Overall she is doing well. She had her hip replacement on the left side which she is recovering from. She still has discomfort  when sitting in a car for long periods of time. She tells me that she's given a be going up to [place] fairly shortly on a vacation and that they will be going up to [facility] for the first time.'  Lauren House returns today for follow-up. She tells me on Saturday night she was awakened early in the morning with palpitations and a warm feeling as well as heaviness in her chest. She says it was quite intense. She denies any vivid dreams prior to that. She's not had any further episodes of chest pain since that or recurrent palpitations. She took a nitroglycerin and the symptoms resolved fairly promptly. She was able to go back to bed. Her EKGs is unchanged showing the T-wave inversions typical of apical variant hypertrophic cardiomyopathy. Her last stress test was in 2014 it was negative for ischemia. She does have a known 50-60% LAD stenosis which was negative by FFR.   07/12/2015  Lauren House was seen today in the office for follow-up. Recently she went to vacation with her family and they noticed more progressive shortness of breath and chest pressure. This was keeping her from doing certain activities. She says her symptoms of gotten worse since her last office visit in December. At that time I performed a nuclear stress test which showed a low normal EF 51% but no reversible ischemia. She does have known 50-60% LAD stenosis which was negative by FFR by catheterization in 2012.  08/26/2015  Lauren House returns today for follow-up. She underwent cardiac catheterization for progressive chest pain concerning for unstable angina on 07/22/2015 by Dr. Herbie Baltimore, this demonstrated the following:  Mid LAD lesion, 80% stenosed. Post intervention with Promus DES 2.25 mm x 16 mm (2.4 mm), there is a 0% residual stenosis. Ost LAD lesion, 50% stenosed. stable from original catheterization The left ventricular systolic function is normal. with minimal apical filling consistent with known apical  hypertrophy. Otherwise mild to moderate disease in the circumflex and RCA system   Since her coronary angiography and stent placement, she reports she is doing fairly well. She started back doing some exercise and push mowed her lawn the other day. Unfortunate she developed a cracked tooth and she's been having problems with pain. She took antibiotics including doxycycline which causes her stomach upset. She has a temporary crown and will be going back for some more dental work. On questioning today she reports that she has discontinued her Plavix. She said she's been off of it for about one week because of spontaneous bruising. She was not counseled to discontinue this and I reiterated the fact today that being off of dual antiplatelet therapy could put her at risk for acute in-stent thrombosis which could lead to massive heart attack.  11/18/2015  Lauren House was seen today in follow-up. She has been undergoing cardiac rehabilitation. She restart her Plavix which is very important given her recent stent in May of this past year. She denies any chest pain but still gets short of breath. She says is fairly stable but not necessarily getting worse. She is now gone through 8 sessions rehabilitation and is up to 3.4 metabolic equivalents.  Blood pressures been fairly well-controlled.  05/22/2016  Lauren House returns today for follow-up. When I last saw her she was undergoing cardiac rehabilitation and reported shortness of breath with exertion. This was shortly after her stenting I recommended continued cardiac rehabilitation. She says that after finishing rehabilitation she has not had any improvement in her shortness of breath and perhaps it may be worse. She notes it when walking up and down her stairs to her basement. She says after the second trip up the stairs she does get short of breath. She is under a lot of stress and there are some family issues which required her grandson to move in with her. She  denies any chest pain. Blood pressure was elevated today 161/85. She says it is always up in the office, however was 140/80 last time.  06/26/2016  Lauren House was seen today in follow-up. Her echocardiogram shows preserved systolic function, mild diastolic dysfunction and elevated LV filling pressure. We understand that she would have diastolic dysfunction secondary to apical hypertrophic cardiomyopathy. She continues to have shortness of breath with exertion. Her BNP is very low less than 100 and stable. I suspect her symptoms may be due to diastolic dysfunction elevated LVEDP. Blood pressure was over 150 systolic today. In addition, recent lipid profile indicated 2 cholesterol 279, triglycerides 216, HDL 48 and LDL-C1 88. Given her coronary artery disease her goal LDL-C is less than 70. She has been intolerant to numerous statins including rosuvastatin, atorvastatin and pravastatin. She is close to one year since her drug-eluting stent in the LAD.  01/01/2017  Lauren House returns today for follow-up.  General she seems to be doing pretty well.  In May we referred her for evaluation to start a PC SK 9 inhibitor.  She had a discussion about starting Repatha however was concerned about cost since she did not have drug coverage.  She is also concerned about not taking the medicine.  She says she ultimately wants to be with her deceased husband.  That being said, she is willing to entertain the medication after I told her today that the cost of come down significantly.  Her cholesterol was recently assessed and LDL remains over 200 untreated.  She has been intolerant to statins as previously outlined in my notes.  Also mentions she has had shingles and 2 bouts of pneumonia since I last saw her.  04/10/2017  Lauren House was seen today in follow-up.  Recently she was seen by primary care provider for bronchitis/pharyngitis.  She was treated with azithromycin.  She still has some scratchiness in her throat but is  improving.  She reports she does get short of breath with exertion, worse than it was a couple years ago but generally has been stable.  She denies any angina.  She is thought about the injectable PCS K9 inhibitors, but declines it at this time.  Unfortunately, her son recently died after being found to have cancer.  12/18/2017  Lauren House is seen today in follow-up.  Recently she has been doing some traveling with her son.  She went [place] and another religious museum.  They were walking all day and she did get short of breath but denied any chest pain with this.  Overall she is done fairly well.  She is a little nervous about the fact that her Ranexa is now going to be generic, but I reassured her the medication would be very similar.  Blood pressure  was well controlled today.  Her EKG appears unchanged.  Unfortunately she had a recent mammogram which was abnormal and they are concerned about a chest wall cyst.  She is concerned that this may be cancer.  12/18/2019  Lauren House is seen today in follow-up.  Unfortunately she recently had COVID-19.  She was symptomatic for several days.  She had been previously vaccinated.  She underwent antibody infusion therapy and got better much quicker.  She is also been complaining of some leg pain.  She has bilateral varicose veins but has not used compression stockings.  EKG shows no significant new changes.  She denies any new chest pain.  She still has a persistently elevated cholesterol.  She has not been interested in additional therapies in the past and was intolerant to statins.  Her LDL was 210 in 2018 with a total cholesterol 300.  She does not eat a high cholesterol or saturated fat diet and therefore most likely has a familial hyperlipidemia.  04/20/2020  Lauren House is seen today in follow-up. Unfortunately she was hesitant to add extra medications. We did discuss the fact that she might qualify for health will grant in  which case she may get Repatha for free. Her cholesterol is significantly elevated and she is at high risk of future cardiovascular events. She did seem amenable to taking the medication if it was free after I explained it to her further.  12/27/2020  Lauren House returns today for follow-up.  Unfortunately she never started on Repatha.  She says she has some concerns about the medication.  It may be due to cost or the fact that she thought she might have side effects or even she said she is now almost 87 years old.  I am not sure the relevance of that since 87 year old right much higher risk of heart disease which could still be prevented by lipid-lowering.  Nonetheless we again had a long discussion about the medication.  I feel like it would be very beneficial for her and probably we can get it at at a cost effective price.  Her cholesterol remains quite high.  In March her total was 290, LDL 199.  Findings consistent with a familial hyperlipidemia.  07/19/2022  Ms. House is seen today in follow-up.  She is now 55.  She said she recently had to have new glasses made after she visited the [facility] and then required her to go to see her ophthalmologist.  She has stopped the Repatha.  She said she could not really afford the medicine or get any patient assistance for it.  We will take it off her list.  Overall she denies any chest pain or shortness of breath.  She has had a few mechanical falls but no syncope.  She did injure her right knee and is going to have an MRI soon.  PMHx:  Past Medical History:  Diagnosis Date   Anxiety    Apical variant hypertrophic cardiomyopathy (HCC)    Arthritis    Borderline diabetes mellitus    Bruises easily    Bursitis of left hip    Coronary atherosclerosis of native coronary artery    a. 2011: Cath showing nonobstructive CAD - 50-60% LAD (normal FFR) b. 06/2015: Cath showing 80% stenosis in the Mid LAD, tx w/ Promus DES 2.25 mm x 16 mm    Essential hypertension,  benign    History of kidney stones    History of skin cancer    Basal cell -  abdomen   Hyperlipidemia    Diet controlled.cannot take meds   Nocturia    Osteoporosis    Overactive bladder    Shortness of breath dyspnea    Vertigo     Past Surgical History:  Procedure Laterality Date   ABDOMINAL HYSTERECTOMY     CARDIAC CATHETERIZATION N/A 07/22/2015   Procedure: Left Heart Cath and Coronary Angiography;  Surgeon: Marykay Lex, MD;  Location: [facility] INVASIVE CV LAB;  Service: Cardiovascular;  Laterality: N/A;   CARDIAC CATHETERIZATION N/A 07/22/2015   Procedure: Coronary Stent Intervention;  Surgeon: Marykay Lex, MD;  Location: [facility] INVASIVE CV LAB;  Service: Cardiovascular;  Laterality: N/A;   CORONARY STENT PLACEMENT  07/22/2015   mid lad  des    EYE SURGERY     cataracts bilateral   HEMORRHOID SURGERY     INGUINAL HERNIA REPAIR  10/11/2011   Procedure: HERNIA REPAIR INGUINAL ADULT;  Surgeon: Almond Lint, MD;  Location: [facility] OR;  Service: General;  Laterality: Right;   OPEN SURGICAL REPAIR OF GLUTEAL TENDON Left 03/12/2014   Procedure: LEFT HIP BURSECTOMY AND GLUTEAL TENDON REPAIR;  Surgeon: Loanne Drilling, MD;  Location: [facility] ORS;  Service: Orthopedics;  Laterality: Left;    FAMHx:  Family History  Problem Relation Age of Onset   Heart disease Father    Lung cancer Sister    Lung cancer Brother     SOCHx:   reports that she has never smoked. She has never been exposed to tobacco smoke. She has never used smokeless tobacco. She reports that she does not drink alcohol and does not use drugs.  ALLERGIES:  Allergies  Allergen Reactions   Chlorhexidine Itching   Vancomycin Hives   Aspirin Other (See Comments)    Nose bleeds with full strength   Atorvastatin Other (See Comments)    Muscle pain   Atorvastatin Calcium Other (See Comments)   Bactrim [Sulfamethoxazole-Trimethoprim] Other (See Comments)    Doesn't remember    Cephalosporins Hives and Itching   Ciprofloxacin  Nausea Only   Clarithromycin Other (See Comments)    Doesn't remember   Colestipol Hcl Other (See Comments)    Hallucinations   Ezetimibe Other (See Comments)    GI upset   Penicillamine Other (See Comments)   Prednisone    Simvastatin Other (See Comments)    Muscle spasms   Penicillins Rash    Has patient had a PCN reaction causing immediate rash, facial/tongue/throat swelling, SOB or lightheadedness with hypotension: yes Has patient had a PCN reaction causing severe rash involving mucus membranes or skin necrosis: no Has patient had a PCN reaction that required hospitalization no Has patient had a PCN reaction occurring within the last 10 years: no If all of the above answers are "NO", then may proceed with Cephalosporin use.    Tape Rash and Other (See Comments)    ROS: Pertinent items noted in HPI and remainder of comprehensive ROS otherwise negative.  HOME MEDS: Current Outpatient Medications  Medication Sig Dispense Refill   aspirin EC 81 MG tablet Take 81 mg by mouth daily.     Butalbital-APAP-Caffeine 50-300-40 MG CAPS Take 1 tablet by mouth every 6 (six) hours as needed (migraine). 10 capsule 0   Cyanocobalamin (VITAMIN B12 PO) Take 1 tablet by mouth daily.     fluticasone (FLONASE) 50 MCG/ACT nasal spray USE TWO SPRAY IN EACH NOSTRIL EVERY DAY 50 g 1   hydrocortisone cream 1 % Apply 1 application topically 2 (two) times  daily. 30 g 0   methocarbamol (ROBAXIN-750) 750 MG tablet Take 1 tablet (750 mg total) by mouth at bedtime as needed for muscle spasms. 30 tablet 1   metoprolol tartrate (LOPRESSOR) 25 MG tablet Take 1 tablet by mouth twice daily 180 tablet 0   Multiple Vitamins-Minerals (OCUVITE PO) Take 1 tablet by mouth daily.     nitroGLYCERIN (NITROSTAT) 0.4 MG SL tablet Place 1 tablet (0.4 mg total) under the tongue every 5 (five) minutes as needed for chest pain. 25 tablet 3   Polyethyl Glycol-Propyl Glycol (SYSTANE OP) Apply 1 drop to eye 3 (three) times daily as  needed (Dry eyes).     ranolazine (RANEXA) 500 MG 12 hr tablet Take 1 tablet by mouth twice daily 60 tablet 6   senna-docusate (SENOKOT-S) 8.6-50 MG tablet Take 1 tablet by mouth at bedtime as needed for mild constipation or moderate constipation. 20 tablet 0   alendronate (FOSAMAX) 70 MG tablet Take 1 tablet (70 mg total) by mouth every 7 (seven) days. Take with a full glass of water on an empty stomach. (Patient not taking: Reported on 07/19/2022) 4 tablet 11   Evolocumab (REPATHA SURECLICK) 140 MG/ML SOAJ Inject 1 Dose into the skin every 14 (fourteen) days. (Patient not taking: Reported on 04/03/2022) 2 mL 11   No current facility-administered medications for this visit.    LABS/IMAGING: No results found for this or any previous visit (from the past 48 hour(s)). No results found.  VITALS: BP (!) 144/80 (BP Location: Left Arm, Patient Position: Sitting, Cuff Size: Normal)   Pulse 76   Ht 5\' 7"  (1.702 m)   Wt 145 lb (65.8 kg)   SpO2 96%   BMI 22.71 kg/m   EXAM: General appearance: alert and no distress Neck: no carotid bruit, no JVD and thyroid not enlarged, symmetric, no tenderness/mass/nodules Lungs: clear to auscultation bilaterally Heart: regular rate and rhythm, S1, S2 normal, no murmur, click, rub or gallop Abdomen: soft, non-tender; bowel sounds normal; no masses,  no organomegaly Extremities: extremities normal, atraumatic, no cyanosis or edema Pulses: 2+ and symmetric Skin: Skin color, texture, turgor normal. No rashes or lesions Neurologic: Grossly normal Psych: Pleasant  EKG: Deferred  ASSESSMENT: History of DOE - echo with normal systolic function, mild diastolic dysfunction and elevated LVEDP  Unstable angina - status post PCI with a Promus DES (2.25 mm x 16 mm to the mid LAD)- known 50-60% ostial LAD stenosis (FFR 0.99 in 2011) which was stable at repeat cath (06/2015) Apical variant hypertrophic cardimyopathy - anterolateral TWI's - negative nuclear stress test  01-2015 Hypertension Venous insufficiency Right inguinal pain with a hernia, status post surgical repair Right hip pain with degenerative hip disease - s/p surgery Statin intolerance - myalgias Probable Familial Hyperlipidemia (FH)  PLAN: 1.   Mrs. Lougee seems to be doing well without any chest pain or shortness of breath.  She had a recent repeat echo in February which showed stable LVEF 60 to 65% with changes consistent with her apical hypertrophic cardiomyopathy.  Fortunately it does not appear to be changed.  She has had no anginal symptoms.  I do suspect she has a genetic dyslipidemia but she could not take Repatha due to concerns about cost.  I will plan to continue current medications.  Follow-up with me annually or sooner as necessary.  Chrystie Nose, MD, [facility], FACP  [place]  [facility]  Medical Director of the Advanced Lipid Disorders &  Cardiovascular Risk Reduction Clinic Diplomate  of the [employer] of Clinical Lipidology Attending Cardiologist  Direct Dial: [phone]  Fax: [phone]  Website:  [email or web address]  Lisette Abu Lezette Kitts 07/19/2022, 2:27 PM

## 2022-07-26 DIAGNOSIS — M25561 Pain in right knee: Secondary | ICD-10-CM | POA: Diagnosis not present

## 2022-08-23 DIAGNOSIS — M170 Bilateral primary osteoarthritis of knee: Secondary | ICD-10-CM | POA: Diagnosis not present

## 2022-08-27 DIAGNOSIS — K080 Exfoliation of teeth due to systemic causes: Secondary | ICD-10-CM | POA: Diagnosis not present

## 2022-09-04 ENCOUNTER — Other Ambulatory Visit: Payer: Self-pay | Admitting: Internal Medicine

## 2022-09-11 DIAGNOSIS — K080 Exfoliation of teeth due to systemic causes: Secondary | ICD-10-CM | POA: Diagnosis not present

## 2022-09-17 DIAGNOSIS — K080 Exfoliation of teeth due to systemic causes: Secondary | ICD-10-CM | POA: Diagnosis not present

## 2022-10-11 DIAGNOSIS — M1711 Unilateral primary osteoarthritis, right knee: Secondary | ICD-10-CM | POA: Diagnosis not present

## 2022-10-12 ENCOUNTER — Telehealth: Payer: Self-pay | Admitting: Family Medicine

## 2022-10-12 ENCOUNTER — Other Ambulatory Visit: Payer: Self-pay | Admitting: Family Medicine

## 2022-10-12 MED ORDER — NIRMATRELVIR/RITONAVIR (PAXLOVID)TABLET: 3.0000 | ORAL_TABLET | Freq: Two times a day (BID) | ORAL | 0 refills | Status: AC

## 2022-10-12 NOTE — Telephone Encounter (Signed)
Patient's daughter Lauren House left voicemail messages to request PAXLOVID for patient who tested positive for COVID this morning; Lauren House stated in vmail patient is very sick.   Sx: bad cough at least 2-3 days, body aches, runny nose.  Pharmacy confirmed as:   [facility] [address], [place] - [address] [place], [place] [address] Phone: [phone]  Fax: [phone]   Please advise Lauren House at [phone].

## 2022-10-18 DIAGNOSIS — M1711 Unilateral primary osteoarthritis, right knee: Secondary | ICD-10-CM | POA: Diagnosis not present

## 2022-10-24 DIAGNOSIS — M1711 Unilateral primary osteoarthritis, right knee: Secondary | ICD-10-CM | POA: Diagnosis not present

## 2022-11-12 DIAGNOSIS — L570 Actinic keratosis: Secondary | ICD-10-CM | POA: Diagnosis not present

## 2022-11-12 DIAGNOSIS — L821 Other seborrheic keratosis: Secondary | ICD-10-CM | POA: Diagnosis not present

## 2022-11-16 ENCOUNTER — Ambulatory Visit: Payer: Medicare Other | Admitting: Family Medicine

## 2022-11-16 ENCOUNTER — Encounter: Payer: Self-pay | Admitting: Family Medicine

## 2022-11-16 VITALS — BP 120/62 | HR 69 | Temp 97.7°F | Ht 67.0 in | Wt 142.0 lb

## 2022-11-16 DIAGNOSIS — J019 Acute sinusitis, unspecified: Secondary | ICD-10-CM | POA: Diagnosis not present

## 2022-11-16 MED ORDER — AZITHROMYCIN 250 MG PO TABS: ORAL_TABLET | ORAL | 0 refills | Status: DC

## 2022-11-16 NOTE — Progress Notes (Signed)
Subjective:    Patient ID: Lauren House, female    DOB: Jul 23, 1935, 87 y.o.   MRN: [id number] Patient had COVID on Labor Day.  Ever since that time she reports a scratchy throat.  She states in the morning she coughs up sputum with a tinge of blood in it.  She denies fevers or chills.  She reports sinus pain bilaterally.  She reports postnasal drip.  On exam today, the patient has tenderness to percussion over both maxillary and both frontal sinuses.  She reports a dull headache on a daily basis.  She also has left basilar crackles on exam that are asymmetric Past Medical History:  Diagnosis Date   Anxiety    Apical variant hypertrophic cardiomyopathy (HCC)    Arthritis    Borderline diabetes mellitus    Bruises easily    Bursitis of left hip    Coronary atherosclerosis of native coronary artery    a. 2011: Cath showing nonobstructive CAD - 50-60% LAD (normal FFR) b. 06/2015: Cath showing 80% stenosis in the Mid LAD, tx w/ Promus DES 2.25 mm x 16 mm    Essential hypertension, benign    History of kidney stones    History of skin cancer    Basal cell - abdomen   Hyperlipidemia    Diet controlled.cannot take meds   Nocturia    Osteoporosis    Overactive bladder    Shortness of breath dyspnea    Vertigo    Past Surgical History:  Procedure Laterality Date   ABDOMINAL HYSTERECTOMY     CARDIAC CATHETERIZATION N/A 07/22/2015   Procedure: Left Heart Cath and Coronary Angiography;  Surgeon: Marykay Lex, MD;  Location: [facility] INVASIVE CV LAB;  Service: Cardiovascular;  Laterality: N/A;   CARDIAC CATHETERIZATION N/A 07/22/2015   Procedure: Coronary Stent Intervention;  Surgeon: Marykay Lex, MD;  Location: [facility] INVASIVE CV LAB;  Service: Cardiovascular;  Laterality: N/A;   CORONARY STENT PLACEMENT  07/22/2015   mid lad  des    EYE SURGERY     cataracts bilateral   HEMORRHOID SURGERY     INGUINAL HERNIA REPAIR  10/11/2011   Procedure: HERNIA REPAIR INGUINAL ADULT;  Surgeon: Almond Lint, MD;  Location: [facility] OR;  Service: General;  Laterality: Right;   OPEN SURGICAL REPAIR OF GLUTEAL TENDON Left 03/12/2014   Procedure: LEFT HIP BURSECTOMY AND GLUTEAL TENDON REPAIR;  Surgeon: Loanne Drilling, MD;  Location: [facility] ORS;  Service: Orthopedics;  Laterality: Left;    Allergies  Allergen Reactions   Chlorhexidine Itching   Vancomycin Hives   Aspirin Other (See Comments)    Nose bleeds with full strength   Atorvastatin Other (See Comments)    Muscle pain   Atorvastatin Calcium Other (See Comments)   Bactrim [Sulfamethoxazole-Trimethoprim] Other (See Comments)    Doesn't remember    Cephalosporins Hives and Itching   Ciprofloxacin Nausea Only   Clarithromycin Other (See Comments)    Doesn't remember   Colestipol Hcl Other (See Comments)   Ezetimibe Other (See Comments)    GI upset   Penicillamine Other (See Comments)   Prednisone    Simvastatin Other (See Comments)    Muscle spasms   Penicillins Rash    Has patient had a PCN reaction causing immediate rash, facial/tongue/throat swelling, SOB or lightheadedness with hypotension: yes Has patient had a PCN reaction causing severe rash involving mucus membranes or skin necrosis: no Has patient had a PCN reaction that required hospitalization no Has patient  had a PCN reaction occurring within the last 10 years: no If all of the above answers are "NO", then may proceed with Cephalosporin use.    Tape Rash and Other (See Comments)   Social History   Socioeconomic History   Marital status: Widowed    Spouse name: Not on file   Number of children: 4   Years of education: 107   Highest education level: 12th grade  Occupational History   Not on file  Tobacco Use   Smoking status: Never    Passive exposure: Never   Smokeless tobacco: Never  Vaping Use   Vaping status: Never Used  Substance and Sexual Activity   Alcohol use: No   Drug use: No   Sexual activity: Not Currently  Other Topics Concern   Not on file   Social History Narrative   3 sons, 1 daughter. 1 son died in 2017-01-31 (unexpectedly)   Husband died in February 01, 2015.   6 grandchildren    10 great grandchildren   Social Determinants of Health   Financial Resource Strain: Low Risk  (04/03/2022)   Overall Financial Resource Strain (CARDIA)    Difficulty of Paying Living Expenses: Not hard at all  Food Insecurity: No Food Insecurity (04/03/2022)   Hunger Vital Sign    Worried About Running Out of Food in the Last Year: Never true    Ran Out of Food in the Last Year: Never true  Transportation Needs: No Transportation Needs (04/03/2022)   PRAPARE - Administrator, Civil Service (Medical): No    Lack of Transportation (Non-Medical): No  Physical Activity: Sufficiently Active (04/03/2022)   Exercise Vital Sign    Days of Exercise per Week: 5 days    Minutes of Exercise per Session: 30 min  Stress: No Stress Concern Present (04/03/2022)   [employer] of Occupational Health - Occupational Stress Questionnaire    Feeling of Stress : Not at all  Social Connections: Moderately Integrated (04/03/2022)   Social Connection and Isolation Panel [NHANES]    Frequency of Communication with Friends and Family: More than three times a week    Frequency of Social Gatherings with Friends and Family: Three times a week    Attends Religious Services: More than 4 times per year    Active Member of Clubs or Organizations: Yes    Attends Banker Meetings: More than 4 times per year    Marital Status: Widowed  Intimate Partner Violence: Not At Risk (04/03/2022)   Humiliation, Afraid, Rape, and Kick questionnaire    Fear of Current or Ex-Partner: No    Emotionally Abused: No    Physically Abused: No    Sexually Abused: No   Family History  Problem Relation Age of Onset   Heart disease Father    Lung cancer Sister    Lung cancer Brother       Review of Systems     Objective:   Physical Exam Vitals reviewed.  Constitutional:       General: She is not in acute distress.    Appearance: Normal appearance. She is normal weight. She is not ill-appearing, toxic-appearing or diaphoretic.  HENT:     Head: Normocephalic and atraumatic.     Right Ear: Tympanic membrane and ear canal normal. Tympanic membrane is not erythematous.     Left Ear: Tympanic membrane and ear canal normal. Tympanic membrane is not erythematous.     Nose:     Right Sinus: Maxillary sinus  tenderness and frontal sinus tenderness present.     Left Sinus: Maxillary sinus tenderness and frontal sinus tenderness present.     Mouth/Throat:     Pharynx: Oropharynx is clear. No oropharyngeal exudate or posterior oropharyngeal erythema.  Neck:     Vascular: No carotid bruit.  Cardiovascular:     Rate and Rhythm: Normal rate and regular rhythm.     Heart sounds: Normal heart sounds. No murmur heard.    No friction rub. No gallop.  Pulmonary:     Effort: Pulmonary effort is normal. No respiratory distress.     Breath sounds: No stridor. Examination of the left-lower field reveals rales. Rales present. No wheezing or rhonchi.    Musculoskeletal:     Cervical back: Neck supple. No rigidity or tenderness.  Lymphadenopathy:     Cervical: No cervical adenopathy.  Skin:    General: Skin is dry.  Neurological:     General: No focal deficit present.     Mental Status: She is alert and oriented to person, place, and time. Mental status is at baseline.     Cranial Nerves: No cranial nerve deficit.           Assessment & Plan:  Acute non-recurrent sinusitis, unspecified location I believe the patient has sinusitis causing postnasal drip causing sore scratchy throat for the last 2 weeks.  Treat sinusitis with Z-Pak.  Reassess next week if no better or sooner if worsening

## 2022-11-23 ENCOUNTER — Ambulatory Visit: Payer: Medicare Other | Admitting: Family Medicine

## 2022-11-27 ENCOUNTER — Ambulatory Visit: Payer: Medicare Other | Admitting: Family Medicine

## 2022-11-27 ENCOUNTER — Encounter: Payer: Self-pay | Admitting: Family Medicine

## 2022-11-27 VITALS — BP 112/62 | HR 69 | Temp 97.8°F | Ht 67.0 in | Wt 142.8 lb

## 2022-11-27 DIAGNOSIS — J019 Acute sinusitis, unspecified: Secondary | ICD-10-CM

## 2022-11-27 DIAGNOSIS — Z23 Encounter for immunization: Secondary | ICD-10-CM

## 2022-11-27 NOTE — Progress Notes (Signed)
Subjective:    Patient ID: Lauren House, female    DOB: 05-27-1935, 87 y.o.   MRN: [id number] 11/16/22 Patient had COVID on Labor Day.  Ever since that time she reports a scratchy throat.  She states in the morning she coughs up sputum with a tinge of blood in it.  She denies fevers or chills.  She reports sinus pain bilaterally.  She reports postnasal drip.  On exam today, the patient has tenderness to percussion over both maxillary and both frontal sinuses.  She reports a dull headache on a daily basis.  She also has left basilar crackles on exam that are asymmetric.  At that time, my plan was: I believe the patient has sinusitis causing postnasal drip causing sore scratchy throat for the last 2 weeks.  Treat sinusitis with Z-Pak.  Reassess next week if no better or sooner if worsening  11/27/22 Patient is here today for recheck.  She states that the cough and the blood-tinged sputum has resolved.  She denies any further pain or pressure in her sinuses.  She denies any headache.  She denies any fever.  She is feeling much better. Past Medical History:  Diagnosis Date   Anxiety    Apical variant hypertrophic cardiomyopathy (HCC)    Arthritis    Borderline diabetes mellitus    Bruises easily    Bursitis of left hip    Coronary atherosclerosis of native coronary artery    a. 2011: Cath showing nonobstructive CAD - 50-60% LAD (normal FFR) b. 06/2015: Cath showing 80% stenosis in the Mid LAD, tx w/ Promus DES 2.25 mm x 16 mm    Essential hypertension, benign    History of kidney stones    History of skin cancer    Basal cell - abdomen   Hyperlipidemia    Diet controlled.cannot take meds   Nocturia    Osteoporosis    Overactive bladder    Shortness of breath dyspnea    Vertigo    Past Surgical History:  Procedure Laterality Date   ABDOMINAL HYSTERECTOMY     CARDIAC CATHETERIZATION N/A 07/22/2015   Procedure: Left Heart Cath and Coronary Angiography;  Surgeon: Marykay Lex, MD;   Location: [facility] INVASIVE CV LAB;  Service: Cardiovascular;  Laterality: N/A;   CARDIAC CATHETERIZATION N/A 07/22/2015   Procedure: Coronary Stent Intervention;  Surgeon: Marykay Lex, MD;  Location: [facility] INVASIVE CV LAB;  Service: Cardiovascular;  Laterality: N/A;   CORONARY STENT PLACEMENT  07/22/2015   mid lad  des    EYE SURGERY     cataracts bilateral   HEMORRHOID SURGERY     INGUINAL HERNIA REPAIR  10/11/2011   Procedure: HERNIA REPAIR INGUINAL ADULT;  Surgeon: Almond Lint, MD;  Location: [facility] OR;  Service: General;  Laterality: Right;   OPEN SURGICAL REPAIR OF GLUTEAL TENDON Left 03/12/2014   Procedure: LEFT HIP BURSECTOMY AND GLUTEAL TENDON REPAIR;  Surgeon: Loanne Drilling, MD;  Location: [facility];  Service: Orthopedics;  Laterality: Left;    Allergies  Allergen Reactions   Chlorhexidine Itching   Vancomycin Hives   Aspirin Other (See Comments)    Nose bleeds with full strength   Atorvastatin Other (See Comments)    Muscle pain   Atorvastatin Calcium Other (See Comments)   Bactrim [Sulfamethoxazole-Trimethoprim] Other (See Comments)    Doesn't remember    Cephalosporins Hives and Itching   Ciprofloxacin Nausea Only   Clarithromycin Other (See Comments)    Doesn't remember  Colestipol Hcl Other (See Comments)   Ezetimibe Other (See Comments)    GI upset   Penicillamine Other (See Comments)   Prednisone    Simvastatin Other (See Comments)    Muscle spasms   Penicillins Rash    Has patient had a PCN reaction causing immediate rash, facial/tongue/throat swelling, SOB or lightheadedness with hypotension: yes Has patient had a PCN reaction causing severe rash involving mucus membranes or skin necrosis: no Has patient had a PCN reaction that required hospitalization no Has patient had a PCN reaction occurring within the last 10 years: no If all of the above answers are "NO", then may proceed with Cephalosporin use.    Tape Rash and Other (See Comments)   Social History    Socioeconomic History   Marital status: Widowed    Spouse name: Not on file   Number of children: 4   Years of education: 86   Highest education level: 12th grade  Occupational History   Not on file  Tobacco Use   Smoking status: Never    Passive exposure: Never   Smokeless tobacco: Never  Vaping Use   Vaping status: Never Used  Substance and Sexual Activity   Alcohol use: No   Drug use: No   Sexual activity: Not Currently  Other Topics Concern   Not on file  Social History Narrative   3 sons, 1 daughter. 1 son died in 12-20-2016 (unexpectedly)   Husband died in 2014/12/21.   6 grandchildren    10 great grandchildren   Social Determinants of Health   Financial Resource Strain: Low Risk  (04/03/2022)   Overall Financial Resource Strain (CARDIA)    Difficulty of Paying Living Expenses: Not hard at all  Food Insecurity: No Food Insecurity (04/03/2022)   Hunger Vital Sign    Worried About Running Out of Food in the Last Year: Never true    Ran Out of Food in the Last Year: Never true  Transportation Needs: No Transportation Needs (04/03/2022)   PRAPARE - Administrator, Civil Service (Medical): No    Lack of Transportation (Non-Medical): No  Physical Activity: Sufficiently Active (04/03/2022)   Exercise Vital Sign    Days of Exercise per Week: 5 days    Minutes of Exercise per Session: 30 min  Stress: No Stress Concern Present (04/03/2022)   [employer] of Occupational Health - Occupational Stress Questionnaire    Feeling of Stress : Not at all  Social Connections: Moderately Integrated (04/03/2022)   Social Connection and Isolation Panel [NHANES]    Frequency of Communication with Friends and Family: More than three times a week    Frequency of Social Gatherings with Friends and Family: Three times a week    Attends Religious Services: More than 4 times per year    Active Member of Clubs or Organizations: Yes    Attends Banker Meetings: More than 4 times  per year    Marital Status: Widowed  Intimate Partner Violence: Not At Risk (04/03/2022)   Humiliation, Afraid, Rape, and Kick questionnaire    Fear of Current or Ex-Partner: No    Emotionally Abused: No    Physically Abused: No    Sexually Abused: No   Family History  Problem Relation Age of Onset   Heart disease Father    Lung cancer Sister    Lung cancer Brother       Review of Systems     Objective:   Physical Exam Vitals  reviewed.  Constitutional:      General: She is not in acute distress.    Appearance: Normal appearance. She is normal weight. She is not ill-appearing, toxic-appearing or diaphoretic.  HENT:     Head: Normocephalic and atraumatic.     Right Ear: Tympanic membrane and ear canal normal. Tympanic membrane is not erythematous.     Left Ear: Tympanic membrane and ear canal normal. Tympanic membrane is not erythematous.     Mouth/Throat:     Pharynx: Oropharynx is clear. No oropharyngeal exudate or posterior oropharyngeal erythema.  Neck:     Vascular: No carotid bruit.  Cardiovascular:     Rate and Rhythm: Normal rate and regular rhythm.     Heart sounds: Normal heart sounds. No murmur heard.    No friction rub. No gallop.  Pulmonary:     Effort: Pulmonary effort is normal. No respiratory distress.     Breath sounds: No stridor. No wheezing, rhonchi or rales.  Musculoskeletal:     Cervical back: Neck supple. No rigidity or tenderness.  Lymphadenopathy:     Cervical: No cervical adenopathy.  Skin:    General: Skin is dry.  Neurological:     General: No focal deficit present.     Mental Status: She is alert and oriented to person, place, and time. Mental status is at baseline.     Cranial Nerves: No cranial nerve deficit.           Assessment & Plan:  Flu vaccine need - Plan: Flu Vaccine Trivalent High Dose (Fluad)  Acute non-recurrent sinusitis, unspecified location Sinusitis has resolved.  I recommended tincture of time for any residual  symptoms to go away.  The patient received her flu shot today.  No further treatment is necessary regarding her sinuses

## 2022-12-06 DIAGNOSIS — M1711 Unilateral primary osteoarthritis, right knee: Secondary | ICD-10-CM | POA: Diagnosis not present

## 2023-04-15 ENCOUNTER — Other Ambulatory Visit: Payer: Self-pay

## 2023-04-15 MED ORDER — RANOLAZINE ER 500 MG PO TB12: 500.0000 mg | ORAL_TABLET | Freq: Two times a day (BID) | ORAL | 1 refills | Status: DC

## 2023-04-16 DIAGNOSIS — H18513 Endothelial corneal dystrophy, bilateral: Secondary | ICD-10-CM | POA: Diagnosis not present

## 2023-04-16 DIAGNOSIS — H43812 Vitreous degeneration, left eye: Secondary | ICD-10-CM | POA: Diagnosis not present

## 2023-04-16 DIAGNOSIS — H353132 Nonexudative age-related macular degeneration, bilateral, intermediate dry stage: Secondary | ICD-10-CM | POA: Diagnosis not present

## 2023-04-16 DIAGNOSIS — H16223 Keratoconjunctivitis sicca, not specified as Sjogren's, bilateral: Secondary | ICD-10-CM | POA: Diagnosis not present

## 2023-05-13 DIAGNOSIS — L821 Other seborrheic keratosis: Secondary | ICD-10-CM | POA: Diagnosis not present

## 2023-05-13 DIAGNOSIS — L570 Actinic keratosis: Secondary | ICD-10-CM | POA: Diagnosis not present

## 2023-06-04 DIAGNOSIS — K080 Exfoliation of teeth due to systemic causes: Secondary | ICD-10-CM | POA: Diagnosis not present

## 2023-06-06 ENCOUNTER — Ambulatory Visit: Payer: Medicare Other | Admitting: *Deleted

## 2023-06-06 DIAGNOSIS — Z0000 Encounter for general adult medical examination without abnormal findings: Secondary | ICD-10-CM | POA: Diagnosis not present

## 2023-06-06 NOTE — Patient Instructions (Signed)
 Lauren House , Thank you for taking time to come for your Medicare Wellness Visit. I appreciate your ongoing commitment to your health goals. Please review the following plan we discussed and let me know if I can assist you in the future.   Screening recommendations/referrals: Colonoscopy: no longer required Mammogram: no longer required Bone Density: Education provided Recommended yearly ophthalmology/optometry visit for glaucoma screening and checkup Recommended yearly dental visit for hygiene and checkup  Vaccinations: Influenza vaccine: up to date Pneumococcal vaccine: up to date Tdap vaccine: Education provided Shingles vaccine: Education provided         Preventive Care 65 Years and Older, Female Preventive care refers to lifestyle choices and visits with your health care provider that can promote health and wellness. What does preventive care include? A yearly physical exam. This is also called an annual well check. Dental exams once or twice a year. Routine eye exams. Ask your health care provider how often you should have your eyes checked. Personal lifestyle choices, including: Daily care of your teeth and gums. Regular physical activity. Eating a healthy diet. Avoiding tobacco and drug use. Limiting alcohol use. Practicing safe sex. Taking low-dose aspirin every day. Taking vitamin and mineral supplements as recommended by your health care provider. What happens during an annual well check? The services and screenings done by your health care provider during your annual well check will depend on your age, overall health, lifestyle risk factors, and family history of disease. Counseling  Your health care provider may ask you questions about your: Alcohol use. Tobacco use. Drug use. Emotional well-being. Home and relationship well-being. Sexual activity. Eating habits. History of falls. Memory and ability to understand (cognition). Work and work  Astronomer. Reproductive health. Screening  You may have the following tests or measurements: Height, weight, and BMI. Blood pressure. Lipid and cholesterol levels. These may be checked every 5 years, or more frequently if you are over 68 years old. Skin check. Lung cancer screening. You may have this screening every year starting at age 75 if you have a 30-pack-year history of smoking and currently smoke or have quit within the past 15 years. Fecal occult blood test (FOBT) of the stool. You may have this test every year starting at age 4. Flexible sigmoidoscopy or colonoscopy. You may have a sigmoidoscopy every 5 years or a colonoscopy every 10 years starting at age 58. Hepatitis C blood test. Hepatitis B blood test. Sexually transmitted disease (STD) testing. Diabetes screening. This is done by checking your blood sugar (glucose) after you have not eaten for a while (fasting). You may have this done every 1-3 years. Bone density scan. This is done to screen for osteoporosis. You may have this done starting at age 99. Mammogram. This may be done every 1-2 years. Talk to your health care provider about how often you should have regular mammograms. Talk with your health care provider about your test results, treatment options, and if necessary, the need for more tests. Vaccines  Your health care provider may recommend certain vaccines, such as: Influenza vaccine. This is recommended every year. Tetanus, diphtheria, and acellular pertussis (Tdap, Td) vaccine. You may need a Td booster every 10 years. Zoster vaccine. You may need this after age 58. Pneumococcal 13-valent conjugate (PCV13) vaccine. One dose is recommended after age 25. Pneumococcal polysaccharide (PPSV23) vaccine. One dose is recommended after age 34. Talk to your health care provider about which screenings and vaccines you need and how often you need them. This  information is not intended to replace advice given to you by  your health care provider. Make sure you discuss any questions you have with your health care provider. Document Released: 03/11/2015 Document Revised: 11/02/2015 Document Reviewed: 12/14/2014 Elsevier Interactive Patient Education  2017 [employer].  Fall Prevention in the Home Falls can cause injuries. They can happen to people of all ages. There are many things you can do to make your home safe and to help prevent falls. What can I do on the outside of my home? Regularly fix the edges of walkways and driveways and fix any cracks. Remove anything that might make you trip as you walk through a door, such as a raised step or threshold. Trim any bushes or trees on the path to your home. Use bright outdoor lighting. Clear any walking paths of anything that might make someone trip, such as rocks or tools. Regularly check to see if handrails are loose or broken. Make sure that both sides of any steps have handrails. Any raised decks and porches should have guardrails on the edges. Have any leaves, snow, or ice cleared regularly. Use sand or salt on walking paths during winter. Clean up any spills in your garage right away. This includes oil or grease spills. What can I do in the bathroom? Use night lights. Install grab bars by the toilet and in the tub and shower. Do not use towel bars as grab bars. Use non-skid mats or decals in the tub or shower. If you need to sit down in the shower, use a plastic, non-slip stool. Keep the floor dry. Clean up any water that spills on the floor as soon as it happens. Remove soap buildup in the tub or shower regularly. Attach bath mats securely with double-sided non-slip rug tape. Do not have throw rugs and other things on the floor that can make you trip. What can I do in the bedroom? Use night lights. Make sure that you have a light by your bed that is easy to reach. Do not use any sheets or blankets that are too big for your bed. They should not hang  down onto the floor. Have a firm chair that has side arms. You can use this for support while you get dressed. Do not have throw rugs and other things on the floor that can make you trip. What can I do in the kitchen? Clean up any spills right away. Avoid walking on wet floors. Keep items that you use a lot in easy-to-reach places. If you need to reach something above you, use a strong step stool that has a grab bar. Keep electrical cords out of the way. Do not use floor polish or wax that makes floors slippery. If you must use wax, use non-skid floor wax. Do not have throw rugs and other things on the floor that can make you trip. What can I do with my stairs? Do not leave any items on the stairs. Make sure that there are handrails on both sides of the stairs and use them. Fix handrails that are broken or loose. Make sure that handrails are as long as the stairways. Check any carpeting to make sure that it is firmly attached to the stairs. Fix any carpet that is loose or worn. Avoid having throw rugs at the top or bottom of the stairs. If you do have throw rugs, attach them to the floor with carpet tape. Make sure that you have a light switch at the top  of the stairs and the bottom of the stairs. If you do not have them, ask someone to add them for you. What else can I do to help prevent falls? Wear shoes that: Do not have high heels. Have rubber bottoms. Are comfortable and fit you well. Are closed at the toe. Do not wear sandals. If you use a stepladder: Make sure that it is fully opened. Do not climb a closed stepladder. Make sure that both sides of the stepladder are locked into place. Ask someone to hold it for you, if possible. Clearly mark and make sure that you can see: Any grab bars or handrails. First and last steps. Where the edge of each step is. Use tools that help you move around (mobility aids) if they are needed. These  include: Canes. Walkers. Scooters. Crutches. Turn on the lights when you go into a dark area. Replace any light bulbs as soon as they burn out. Set up your furniture so you have a clear path. Avoid moving your furniture around. If any of your floors are uneven, fix them. If there are any pets around you, be aware of where they are. Review your medicines with your doctor. Some medicines can make you feel dizzy. This can increase your chance of falling. Ask your doctor what other things that you can do to help prevent falls. This information is not intended to replace advice given to you by your health care provider. Make sure you discuss any questions you have with your health care provider. Document Released: 12/09/2008 Document Revised: 07/21/2015 Document Reviewed: 03/19/2014 Elsevier Interactive Patient Education  2017 [employer].

## 2023-06-06 NOTE — Progress Notes (Signed)
 Subjective:   Lauren House is a 88 y.o. female who presents for Medicare Annual (Subsequent) preventive examination.  Visit Complete: Virtual I connected with  Selmer Dominion on 06/06/23 by a audio enabled telemedicine application and verified that I am speaking with the correct person using two identifiers.  Patient Location: Home  Provider Location: Home Office  I discussed the limitations of evaluation and management by telemedicine. The patient expressed understanding and agreed to proceed.  Vital Signs: Because this visit was a virtual/telehealth visit, some criteria may be missing or patient reported. Any vitals not documented were not able to be obtained and vitals that have been documented are patient reported.   Cardiac Risk Factors include: advanced age (>23men, >56 women);hypertension     Objective:    There were no vitals filed for this visit. There is no height or weight on file to calculate BMI.     06/06/2023   11:54 AM 04/03/2022    4:03 PM 11/28/2021   12:27 PM 07/30/2021    8:32 AM 03/23/2021    3:02 PM 10/03/2020   12:00 PM 12/28/2019   10:30 AM  Advanced Directives  Does Patient Have a Medical Advance Directive? Yes Yes Yes Yes Yes Yes Yes  Type of Diplomatic Services operational officer Living will Healthcare Power of [place];Living will Healthcare Power of [employer] Power of Attorney Living will;Healthcare Power of [employer] Power of [place];Living will  Does patient want to make changes to medical advance directive?  No - Patient declined No - Patient declined No - Patient declined  No - Patient declined   Copy of Healthcare Power of Attorney in Chart? No - copy requested  No - copy requested No - copy requested No - copy requested No - copy requested     Current Medications (verified) Outpatient Encounter Medications as of 06/06/2023  Medication Sig   alendronate (FOSAMAX) 70 MG tablet Take 1 tablet (70 mg total) by mouth every  7 (seven) days. Take with a full glass of water on an empty stomach.   aspirin EC 81 MG tablet Take 81 mg by mouth daily.   azithromycin (ZITHROMAX) 250 MG tablet 2 tabs poqday1, 1 tab poqday 2-5   Butalbital-APAP-Caffeine 50-300-40 MG CAPS Take 1 tablet by mouth every 6 (six) hours as needed (migraine).   Cyanocobalamin (VITAMIN B12 PO) Take 1 tablet by mouth daily.   fluticasone (FLONASE) 50 MCG/ACT nasal spray USE TWO SPRAY IN EACH NOSTRIL EVERY DAY   hydrocortisone cream 1 % Apply 1 application topically 2 (two) times daily.   methocarbamol (ROBAXIN-750) 750 MG tablet Take 1 tablet (750 mg total) by mouth at bedtime as needed for muscle spasms.   metoprolol tartrate (LOPRESSOR) 25 MG tablet Take 1 tablet by mouth twice daily   Multiple Vitamins-Minerals (OCUVITE PO) Take 1 tablet by mouth daily.   nitroGLYCERIN (NITROSTAT) 0.4 MG SL tablet Place 1 tablet (0.4 mg total) under the tongue every 5 (five) minutes as needed for chest pain.   Polyethyl Glycol-Propyl Glycol (SYSTANE OP) Apply 1 drop to eye 3 (three) times daily as needed (Dry eyes).   ranolazine (RANEXA) 500 MG 12 hr tablet Take 1 tablet (500 mg total) by mouth 2 (two) times daily.   senna-docusate (SENOKOT-S) 8.6-50 MG tablet Take 1 tablet by mouth at bedtime as needed for mild constipation or moderate constipation.   No facility-administered encounter medications on file as of 06/06/2023.    Allergies (verified) Chlorhexidine, Vancomycin, Aspirin,  Atorvastatin, Atorvastatin calcium, Bactrim [sulfamethoxazole-trimethoprim], Cephalosporins, Ciprofloxacin, Clarithromycin, Colestipol hcl, Ezetimibe, Penicillamine, Prednisone, Simvastatin, Penicillins, and Tape   History: Past Medical History:  Diagnosis Date   Anxiety    Apical variant hypertrophic cardiomyopathy (HCC)    Arthritis    Borderline diabetes mellitus    Bruises easily    Bursitis of left hip    Coronary atherosclerosis of native coronary artery    a. 06-22-09: Cath  showing nonobstructive CAD - 50-60% LAD (normal FFR) b. 06/2015: Cath showing 80% stenosis in the Mid LAD, tx w/ Promus DES 2.25 mm x 16 mm    Essential hypertension, benign    History of kidney stones    History of skin cancer    Basal cell - abdomen   Hyperlipidemia    Diet controlled.cannot take meds   Nocturia    Osteoporosis    Overactive bladder    Shortness of breath dyspnea    Vertigo    Past Surgical History:  Procedure Laterality Date   ABDOMINAL HYSTERECTOMY     CARDIAC CATHETERIZATION N/A 07/22/2015   Procedure: Left Heart Cath and Coronary Angiography;  Surgeon: Marykay Lex, MD;  Location: [facility] INVASIVE CV LAB;  Service: Cardiovascular;  Laterality: N/A;   CARDIAC CATHETERIZATION N/A 07/22/2015   Procedure: Coronary Stent Intervention;  Surgeon: Marykay Lex, MD;  Location: [facility] INVASIVE CV LAB;  Service: Cardiovascular;  Laterality: N/A;   CORONARY STENT PLACEMENT  07/22/2015   mid lad  des    EYE SURGERY     cataracts bilateral   HEMORRHOID SURGERY     INGUINAL HERNIA REPAIR  10/11/2011   Procedure: HERNIA REPAIR INGUINAL ADULT;  Surgeon: Almond Lint, MD;  Location: [facility] OR;  Service: General;  Laterality: Right;   OPEN SURGICAL REPAIR OF GLUTEAL TENDON Left 03/12/2014   Procedure: LEFT HIP BURSECTOMY AND GLUTEAL TENDON REPAIR;  Surgeon: Loanne Drilling, MD;  Location: [facility] ORS;  Service: Orthopedics;  Laterality: Left;   Family History  Problem Relation Age of Onset   Heart disease Father    Lung cancer Sister    Lung cancer Brother    Social History   Socioeconomic History   Marital status: Widowed    Spouse name: Not on file   Number of children: 4   Years of education: 10   Highest education level: 12th grade  Occupational History   Not on file  Tobacco Use   Smoking status: Never    Passive exposure: Never   Smokeless tobacco: Never  Vaping Use   Vaping status: Never Used  Substance and Sexual Activity   Alcohol use: No   Drug use: No   Sexual  activity: Not Currently  Other Topics Concern   Not on file  Social History Narrative   3 sons, 1 daughter. 1 son died in 06/22/16 (unexpectedly)   Husband died in 2014-06-23.   6 grandchildren    10 great grandchildren   Social Drivers of Corporate investment banker Strain: Low Risk  (06/06/2023)   Overall Financial Resource Strain (CARDIA)    Difficulty of Paying Living Expenses: Not hard at all  Food Insecurity: No Food Insecurity (06/06/2023)   Hunger Vital Sign    Worried About Running Out of Food in the Last Year: Never true    Ran Out of Food in the Last Year: Never true  Transportation Needs: No Transportation Needs (06/06/2023)   PRAPARE - Administrator, Civil Service (Medical): No  Lack of Transportation (Non-Medical): No  Physical Activity: Insufficiently Active (06/06/2023)   Exercise Vital Sign    Days of Exercise per Week: 3 days    Minutes of Exercise per Session: 30 min  Stress: No Stress Concern Present (06/06/2023)   [employer] of Occupational Health - Occupational Stress Questionnaire    Feeling of Stress : Not at all  Social Connections: Moderately Integrated (06/06/2023)   Social Connection and Isolation Panel [NHANES]    Frequency of Communication with Friends and Family: More than three times a week    Frequency of Social Gatherings with Friends and Family: More than three times a week    Attends Religious Services: More than 4 times per year    Active Member of [employer] or Organizations: Yes    Attends Banker Meetings: More than 4 times per year    Marital Status: Widowed    Tobacco Counseling Counseling given: Not Answered   Clinical Intake:  Pre-visit preparation completed: Yes  Pain : No/denies pain     Diabetes: No  How often do you need to have someone help you when you read instructions, pamphlets, or other written materials from your doctor or pharmacy?: 1 - Never  Interpreter Needed?: No  Information entered by  :: Remi Haggard LPN   Activities of Daily Living    06/06/2023   11:58 AM  In your present state of health, do you have any difficulty performing the following activities:  Hearing? 0  Vision? 0  Difficulty concentrating or making decisions? 0  Walking or climbing stairs? 0  Dressing or bathing? 0  Doing errands, shopping? 0  Preparing Food and eating ? N  Using the Toilet? N  In the past six months, have you accidently leaked urine? Y  Do you have problems with loss of bowel control? N  Managing your Medications? N  Managing your Finances? N    Patient Care Team: Donita Brooks, MD as PCP - General (Family Medicine) Tanya Nones Priscille Heidelberg, MD (Family Medicine)  Indicate any recent Medical Services you may have received from other than [facility] providers in the past year (date may be approximate).     Assessment:   This is a routine wellness examination for Lauren House.  Hearing/Vision screen Hearing Screening - Comments:: No trouble hearing Vision Screening - Comments:: [facility] Up to date Cataract  surgery   [place]   Goals Addressed             This Visit's Progress    Patient Stated       Stay healthy       Depression Screen    06/06/2023   11:57 AM 04/03/2022    1:49 PM 11/28/2021   12:23 PM 11/27/2021   11:49 AM 07/28/2021    9:13 AM 03/23/2021    2:55 PM 10/03/2020   12:19 PM  PHQ 2/9 Scores  PHQ - 2 Score 0 0 0 0 0 0 0  PHQ- 9 Score 0          Fall Risk    06/06/2023   11:49 AM 04/03/2022   12:09 PM 11/28/2021   12:25 PM 07/28/2021    9:13 AM 03/23/2021    3:07 PM  Fall Risk   Falls in the past year? 0 1 0 0 0  Number falls in past yr: 0 0 0  0  Injury with Fall? 0 1 0  0  Risk for fall due to :  History of fall(s)  Impaired vision  No Fall Risks  Follow up Falls evaluation completed;Education provided;Falls prevention discussed Falls evaluation completed;Education provided;Falls prevention discussed Falls evaluation completed;Education provided;Falls prevention  discussed Falls evaluation completed Falls prevention discussed    MEDICARE RISK AT HOME: Medicare Risk at Home Any stairs in or around the home?: Yes If so, are there any without handrails?: No Home free of loose throw rugs in walkways, pet beds, electrical cords, etc?: Yes Adequate lighting in your home to reduce risk of falls?: Yes Life alert?: Yes Use of a cane, walker or w/c?: No Grab bars in the bathroom?: Yes Shower chair or bench in shower?: No Elevated toilet seat or a handicapped toilet?: Yes  TIMED UP AND GO:  Was the test performed?  No    Cognitive Function:        06/06/2023   11:52 AM 04/03/2022    4:03 PM 03/23/2021    3:12 PM 12/28/2019   10:32 AM  6CIT Screen  What Year? 0 points 0 points 0 points 0 points  What month? 0 points 0 points 0 points 0 points  What time? 0 points 0 points 0 points 0 points  Count back from 20 0 points 0 points 0 points 0 points  Months in reverse 0 points 0 points 0 points 0 points  Repeat phrase 0 points 0 points 2 points 0 points  Total Score 0 points 0 points 2 points 0 points    Immunizations Immunization History  Administered Date(s) Administered   Fluad Quad(high Dose 65+) 12/23/2018, 12/28/2019, 11/30/2020, 11/27/2021   Fluad Trivalent(High Dose 65+) 11/27/2022   Influenza Whole 12/25/2005, 12/26/2006   Influenza, High Dose Seasonal PF 12/03/2017   Influenza,inj,Quad PF,6+ Mos 11/27/2012, 12/08/2013, 11/18/2014, 11/01/2015, 11/05/2016   Influenza,inj,quad, With Preservative 12/12/2017   Influenza-Unspecified 12/12/2017   Moderna Sars-Covid-2 Vaccination 04/23/2019, 05/22/2019   Pneumococcal Conjugate-13 11/02/2015   Pneumococcal Polysaccharide-23 12/23/2012   Td 06/27/2003   Td (Adult), 2 Lf Tetanus Toxid, Preservative Free 06/27/2003    TDAP status: Due, Education has been provided regarding the importance of this vaccine. Advised may receive this vaccine at local pharmacy or Health Dept. Aware to provide a copy  of the vaccination record if obtained from local pharmacy or Health Dept. Verbalized acceptance and understanding.  Flu Vaccine status: Up to date  Pneumococcal vaccine status: Up to date  Covid-19 vaccine status: Information provided on how to obtain vaccines.   Qualifies for Shingles Vaccine? Yes   Zostavax completed No   Shingrix Completed?: No.    Education has been provided regarding the importance of this vaccine. Patient has been advised to call insurance company to determine out of pocket expense if they have not yet received this vaccine. Advised may also receive vaccine at local pharmacy or Health Dept. Verbalized acceptance and understanding.  Screening Tests Health Maintenance  Topic Date Due   FOOT EXAM  Never done   OPHTHALMOLOGY EXAM  Never done   Zoster Vaccines- Shingrix (1 of 2) Never done   COVID-19 Vaccine (3 - Moderna risk series) 06/19/2019   HEMOGLOBIN A1C  10/09/2022   INFLUENZA VACCINE  09/27/2023   Medicare Annual Wellness (AWV)  06/05/2024   Pneumonia Vaccine 41+ Years old  Completed   DEXA SCAN  Completed   HPV VACCINES  Aged Out   Meningococcal B Vaccine  Aged Out   DTaP/Tdap/Td  Discontinued    Health Maintenance  Health Maintenance Due  Topic Date Due   FOOT EXAM  Never done  OPHTHALMOLOGY EXAM  Never done   Zoster Vaccines- Shingrix (1 of 2) Never done   COVID-19 Vaccine (3 - Moderna risk series) 06/19/2019   HEMOGLOBIN A1C  10/09/2022    Colorectal cancer screening: No longer required.   Mammogram status: No longer required due to  .  Bone Density status: Completed 2018. Results reflect: Bone density results: OSTEOPOROSIS. Repeat every 2 years.  Lung Cancer Screening: (Low Dose CT Chest recommended if Age 86-80 years, 20 pack-year currently smoking OR have quit w/in 15years.) does not qualify.   Lung Cancer Screening Referral:   Additional Screening:  Hepatitis C Screening: does not qualify  Vision Screening: Recommended annual  ophthalmology exams for early detection of glaucoma and other disorders of the eye. Is the patient up to date with their annual eye exam?  No  Who is the provider or what is the name of the office in which the patient attends annual eye exams? [facility] If pt is not established with a provider, would they like to be referred to a provider to establish care? No .   Dental Screening: Recommended annual dental exams for proper oral hygiene    Community Resource Referral / Chronic Care Management: CRR required this visit?  No   CCM required this visit?  No     Plan:     I have personally reviewed and noted the following in the patient's chart:   Medical and social history Use of alcohol, tobacco or illicit drugs  Current medications and supplements including opioid prescriptions. Patient is not currently taking opioid prescriptions. Functional ability and status Nutritional status Physical activity Advanced directives List of other physicians Hospitalizations, surgeries, and ER visits in previous 12 months Vitals Screenings to include cognitive, depression, and falls Referrals and appointments  In addition, I have reviewed and discussed with patient certain preventive protocols, quality metrics, and best practice recommendations. A written personalized care plan for preventive services as well as general preventive health recommendations were provided to patient.     Remi Haggard, LPN   9/81/1914   After Visit Summary: (MyChart) Due to this being a telephonic visit, the after visit summary with patients personalized plan was offered to patient via MyChart   Nurse Notes:

## 2023-06-27 DIAGNOSIS — K080 Exfoliation of teeth due to systemic causes: Secondary | ICD-10-CM | POA: Diagnosis not present

## 2023-07-10 ENCOUNTER — Encounter: Payer: Self-pay | Admitting: Family Medicine

## 2023-07-10 ENCOUNTER — Ambulatory Visit: Admitting: Family Medicine

## 2023-07-10 VITALS — BP 106/60 | HR 67 | Temp 98.0°F | Ht 67.0 in | Wt 138.4 lb

## 2023-07-10 DIAGNOSIS — I422 Other hypertrophic cardiomyopathy: Secondary | ICD-10-CM | POA: Diagnosis not present

## 2023-07-10 DIAGNOSIS — J309 Allergic rhinitis, unspecified: Secondary | ICD-10-CM

## 2023-07-10 DIAGNOSIS — R5383 Other fatigue: Secondary | ICD-10-CM | POA: Diagnosis not present

## 2023-07-10 DIAGNOSIS — J029 Acute pharyngitis, unspecified: Secondary | ICD-10-CM | POA: Diagnosis not present

## 2023-07-10 DIAGNOSIS — R0981 Nasal congestion: Secondary | ICD-10-CM | POA: Diagnosis not present

## 2023-07-10 LAB — URINALYSIS, ROUTINE W REFLEX MICROSCOPIC
Bacteria, UA: NONE SEEN /HPF
Bilirubin Urine: NEGATIVE
Glucose, UA: NEGATIVE
Hgb urine dipstick: NEGATIVE
Hyaline Cast: NONE SEEN /LPF
Ketones, ur: NEGATIVE
Leukocytes,Ua: NEGATIVE
Nitrite: NEGATIVE
RBC / HPF: NONE SEEN /HPF (ref 0–2)
Specific Gravity, Urine: 1.025 (ref 1.001–1.035)
pH: 6 (ref 5.0–8.0)

## 2023-07-10 LAB — MICROSCOPIC MESSAGE

## 2023-07-10 NOTE — Progress Notes (Unsigned)
 Patient Office Visit  Assessment & Plan:  Other fatigue -     CBC with Differential/Platelet -     Comprehensive metabolic panel with GFR -     Urinalysis, Routine w reflex microscopic -     Urine Culture; Future  Sore throat -     STREP GROUP A AG, W/REFLEX TO CULT -     SARS-CoV-2 RNA, Influenza A/B, and RSV RNA, Qualitative NAAT -     Culture, Group A Strep  Allergic rhinitis, unspecified seasonality, unspecified trigger  Apical variant hypertrophic cardiomyopathy (HCC)  Other orders -     Microscopic Message   Assessment and Plan    Congestion and cough Acute congestion and cough with sore throat, rhinorrhea, and fatigue. Differential includes viral infection, allergies, or streptococcal pharyngitis. Afebrile, no respiratory distress, lungs clear. Consider testing for streptococcal pharyngitis, COVID-19, and RSV. - Perform rapid antigen test for streptococcal pharyngitis. - Consider COVID-19 and RSV testing. - Initiate symptomatic treatment if viral.  Diastolic heart failure Diastolic heart failure with impaired left ventricular relaxation. No acute exacerbation, stable weight, no significant edema. - Continue regular cardiology follow-up.  Hypertrophic cardiomyopathy Hypertrophic cardiomyopathy with myocardial hypertrophy. No acute symptoms. - Maintain regular cardiology follow-up.      If cough does not improve will need to order CXR. Patient and daughter agree with the plan.   Return if symptoms worsen or fail to improve.   Subjective:     Patient ID: Lauren House, female    DOB: 02-Jul-1935  Age: 88 y.o. MRN: [id number]  Chief Complaint  Patient presents with   Cough    No appetite, low energy, sore throat. Non productive cough. Has been going on since Sunday.     Cough   Discussed the use of AI scribe software for clinical note transcription with the patient, who gave verbal consent to proceed.  History of Present Illness   Lauren House is an  88 year old female with diastolic heart disease who presents with fatigue and respiratory symptoms.  She has been experiencing fatigue since Sunday, accompanied by a sore throat, persistent cough, and nasal congestion. The cough is severe and almost causes breathlessness, with associated rhinorrhea and lacrimation. She uses a towel on her pillow at night due to nasal discharge. Mucinex taken before bed has improved her sleep.  She denies recent exposure to individuals with strep throat or COVID-19, although she attended church on Sunday despite feeling unwell. She feels unusually cold, which is atypical for her, and denies fever or chills. She experiences a dry cough and shortness of breath without sputum production.  Her past medical history includes diastolic heart disease, left ventricular hypertrophy, and a heart murmur. She underwent angioplasty in 2017 and carries nitroglycerin  as a precaution. She sees a cardiologist annually. She denies any recent weight changes, although she has lost a few pounds. She mentions a previous fall from bed that affected her leg.  She has a history of COVID-19 in August, followed by a strep throat infection treated with antibiotics. She denies any recent urinary tract infections but mentions frequent urination and the use of adult diapers due to a previous bladder procedure. She lives independently and maintains an active lifestyle, including mowing the lawn with a [employer]     Physical Exam CHEST: Lungs clear to auscultation bilaterally. Results DIAGNOSTIC Echocardiogram: Impaired relaxation, mitral valve (2024) Assessment & Plan Congestion and cough with sore throat Acute congestion and cough with sore throat,  rhinorrhea, and fatigue. Differential includes viral infection, allergies, or streptococcal pharyngitis. Afebrile, no respiratory distress, lungs clear. Consider testing for streptococcal pharyngitis, COVID-19, and RSV. - Perform rapid antigen  test for streptococcal pharyngitis. Rapid strep negative  - Consider COVID-19 and RSV testing. - Initiate symptomatic treatment if viral.  Diastolic heart failure/diastolic dysfunction Diastolic heart failure with impaired left ventricular relaxation. No acute exacerbation, stable weight, no significant edema. - Continue regular cardiology follow-up.  Hypertrophic cardiomyopathy (apical variant) Hypertrophic cardiomyopathy with myocardial hypertrophy. No acute symptoms of chest pain or shortness of breath. - Maintain regular cardiology follow-up.     The ASCVD Risk score (Arnett DK, et al., 2019) failed to calculate for the following reasons:   The 2019 ASCVD risk score is only valid for ages 17 to 24  Past Medical History:  Diagnosis Date   Anxiety    Apical variant hypertrophic cardiomyopathy (HCC)    Arthritis    Borderline diabetes mellitus    Bruises easily    Bursitis of left hip    Coronary atherosclerosis of native coronary artery    a. 2011: Cath showing nonobstructive CAD - 50-60% LAD (normal FFR) b. 06/2015: Cath showing 80% stenosis in the Mid LAD, tx w/ Promus DES 2.25 mm x 16 mm    Essential hypertension, benign    History of kidney stones    History of skin cancer    Basal cell - abdomen   Hyperlipidemia    Diet controlled.cannot take meds   Nocturia    Osteoporosis    Overactive bladder    Shortness of breath dyspnea    Vertigo    Past Surgical History:  Procedure Laterality Date   ABDOMINAL HYSTERECTOMY     CARDIAC CATHETERIZATION N/A 07/22/2015   Procedure: Left Heart Cath and Coronary Angiography;  Surgeon: Arleen Lacer, MD;  Location: [facility] INVASIVE CV LAB;  Service: Cardiovascular;  Laterality: N/A;   CARDIAC CATHETERIZATION N/A 07/22/2015   Procedure: Coronary Stent Intervention;  Surgeon: Arleen Lacer, MD;  Location: [facility] INVASIVE CV LAB;  Service: Cardiovascular;  Laterality: N/A;   CORONARY STENT PLACEMENT  07/22/2015   mid lad  des    EYE  SURGERY     cataracts bilateral   HEMORRHOID SURGERY     INGUINAL HERNIA REPAIR  10/11/2011   Procedure: HERNIA REPAIR INGUINAL ADULT;  Surgeon: Lockie Rima, MD;  Location: [facility] OR;  Service: General;  Laterality: Right;   OPEN SURGICAL REPAIR OF GLUTEAL TENDON Left 03/12/2014   Procedure: LEFT HIP BURSECTOMY AND GLUTEAL TENDON REPAIR;  Surgeon: Aurther Blue, MD;  Location: [facility] ORS;  Service: Orthopedics;  Laterality: Left;   Social History   Tobacco Use   Smoking status: Never    Passive exposure: Never   Smokeless tobacco: Never  Vaping Use   Vaping status: Never Used  Substance Use Topics   Alcohol use: No   Drug use: No   Family History  Problem Relation Age of Onset   Heart disease Father    Lung cancer Sister    Lung cancer Brother    Allergies  Allergen Reactions   Chlorhexidine  Itching   Vancomycin  Hives   Aspirin  Other (See Comments)    Nose bleeds with full strength   Atorvastatin Other (See Comments)    Muscle pain   Atorvastatin Calcium Other (See Comments)   Bactrim [Sulfamethoxazole -Trimethoprim] Other (See Comments)    Doesn't remember    Cephalosporins Hives and Itching   Ciprofloxacin Nausea Only  Clarithromycin Other (See Comments)    Doesn't remember   Colestipol Hcl Other (See Comments)   Ezetimibe Other (See Comments)    GI upset   Penicillamine Other (See Comments)   Prednisone     Simvastatin Other (See Comments)    Muscle spasms   Penicillins Rash    Has patient had a PCN reaction causing immediate rash, facial/tongue/throat swelling, SOB or lightheadedness with hypotension: yes Has patient had a PCN reaction causing severe rash involving mucus membranes or skin necrosis: no Has patient had a PCN reaction that required hospitalization no Has patient had a PCN reaction occurring within the last 10 years: no If all of the above answers are "NO", then may proceed with Cephalosporin use.    Tape Rash and Other (See Comments)    Review of  Systems  Respiratory:  Positive for cough.       Objective:    BP 106/60   Pulse 67   Temp 98 F (36.7 C)   Ht 5\' 7"  (1.702 m)   Wt 138 lb 6 oz (62.8 kg)   SpO2 97%   BMI 21.67 kg/m  BP Readings from Last 3 Encounters:  07/10/23 106/60  11/27/22 112/62  11/16/22 120/62   Wt Readings from Last 3 Encounters:  07/10/23 138 lb 6 oz (62.8 kg)  11/27/22 142 lb 12.8 oz (64.8 kg)  11/16/22 142 lb (64.4 kg)    Physical Exam Vitals and nursing note reviewed.  Constitutional:      General: She is not in acute distress.    Appearance: Normal appearance.     Comments: Patient comes in with her daughter  HENT:     Head: Normocephalic.     Right Ear: Tympanic membrane, ear canal and external ear normal.     Left Ear: Tympanic membrane, ear canal and external ear normal.     Nose: Congestion present.     Mouth/Throat:     Pharynx: No oropharyngeal exudate or posterior oropharyngeal erythema.  Eyes:     Extraocular Movements: Extraocular movements intact.     Pupils: Pupils are equal, round, and reactive to light.  Cardiovascular:     Rate and Rhythm: Normal rate and regular rhythm.     Heart sounds: Normal heart sounds.  Pulmonary:     Effort: Pulmonary effort is normal.     Breath sounds: Normal breath sounds. No wheezing or rhonchi.  Abdominal:     Tenderness: There is no abdominal tenderness. There is no right CVA tenderness or left CVA tenderness.  Musculoskeletal:     Right lower leg: No edema.     Left lower leg: No edema.  Neurological:     General: No focal deficit present.     Mental Status: She is alert and oriented to person, place, and time.  Psychiatric:        Mood and Affect: Mood normal.        Behavior: Behavior normal.      Results for orders placed or performed in visit on 07/10/23  STREP GROUP A AG, W/REFLEX TO CULT   Specimen: Throat  Result Value Ref Range   Streptococcus Group A AG NOT DETECTED NOT DETECTED  Urine Culture   Specimen: Urine   Result Value Ref Range   MICRO NUMBER: [id number]    SPECIMEN QUALITY: Adequate    Sample Source URINE    STATUS: FINAL    Result: No Growth   CBC with Differential/Platelet  Result Value Ref Range  WBC 6.9 3.8 - 10.8 Thousand/uL   RBC 5.22 (H) 3.80 - 5.10 Million/uL   Hemoglobin 14.4 11.7 - 15.5 g/dL   HCT 82.9 56.2 - 13.0 %   MCV 85.2 80.0 - 100.0 fL   MCH 27.6 27.0 - 33.0 pg   MCHC 32.4 32.0 - 36.0 g/dL   RDW 86.5 78.4 - 69.6 %   Platelets 238 140 - 400 Thousand/uL   MPV 11.1 7.5 - 12.5 fL   Neutro Abs 3,519 1,500 - 7,800 cells/uL   Absolute Lymphocytes 2,519 850 - 3,900 cells/uL   Absolute Monocytes 614 200 - 950 cells/uL   Eosinophils Absolute 207 15 - 500 cells/uL   Basophils Absolute 41 0 - 200 cells/uL   Neutrophils Relative % 51 %   Total Lymphocyte 36.5 %   Monocytes Relative 8.9 %   Eosinophils Relative 3.0 %   Basophils Relative 0.6 %  Comprehensive metabolic panel with GFR  Result Value Ref Range   Glucose, Bld 119 (H) 65 - 99 mg/dL   BUN 18 7 - 25 mg/dL   Creat 2.95 (H) 2.84 - 0.95 mg/dL   eGFR 55 (L) > OR = 60 mL/min/1.32m2   BUN/Creatinine Ratio 18 6 - 22 (calc)   Sodium 138 135 - 146 mmol/L   Potassium 4.7 3.5 - 5.3 mmol/L   Chloride 99 98 - 110 mmol/L   CO2 29 20 - 32 mmol/L   Calcium 9.2 8.6 - 10.4 mg/dL   Total Protein 7.4 6.1 - 8.1 g/dL   Albumin 4.2 3.6 - 5.1 g/dL   Globulin 3.2 1.9 - 3.7 g/dL (calc)   AG Ratio 1.3 1.0 - 2.5 (calc)   Total Bilirubin 0.7 0.2 - 1.2 mg/dL   Alkaline phosphatase (APISO) 63 37 - 153 U/L   AST 19 10 - 35 U/L   ALT 11 6 - 29 U/L  Urinalysis, Routine w reflex microscopic  Result Value Ref Range   Color, Urine DARK YELLOW YELLOW   APPearance CLEAR CLEAR   Specific Gravity, Urine 1.025 1.001 - 1.035   pH 6.0 5.0 - 8.0   Glucose, UA NEGATIVE NEGATIVE   Bilirubin Urine NEGATIVE NEGATIVE   Ketones, ur NEGATIVE NEGATIVE   Hgb urine dipstick NEGATIVE NEGATIVE   Protein, ur 1+ (A) NEGATIVE   Nitrite NEGATIVE NEGATIVE    Leukocytes,Ua NEGATIVE NEGATIVE   WBC, UA 0-5 0 - 5 /HPF   RBC / HPF NONE SEEN 0 - 2 /HPF   Squamous Epithelial / HPF 0-5 < OR = 5 /HPF   Bacteria, UA NONE SEEN NONE SEEN /HPF   Hyaline Cast NONE SEEN NONE SEEN /LPF  SARS-CoV-2 RNA, Influenza A/B, and RSV RNA, Qualitative NAAT  Result Value Ref Range   INFLUENZA A RNA NOT DETECTED NOT DETECTED   INFLUENZA B RNA NOT DETECTED NOT DETECTED   RSV RNA NOT DETECTED NOT DETECTED   SARS COV2 RNA NOT DETECTED NOT DETECTED  Microscopic Message  Result Value Ref Range   Note

## 2023-07-11 ENCOUNTER — Ambulatory Visit: Payer: Self-pay | Admitting: Family Medicine

## 2023-07-11 LAB — COMPREHENSIVE METABOLIC PANEL WITH GFR
AG Ratio: 1.3 (calc) (ref 1.0–2.5)
ALT: 11 U/L (ref 6–29)
AST: 19 U/L (ref 10–35)
Albumin: 4.2 g/dL (ref 3.6–5.1)
Alkaline phosphatase (APISO): 63 U/L (ref 37–153)
BUN/Creatinine Ratio: 18 (calc) (ref 6–22)
BUN: 18 mg/dL (ref 7–25)
CO2: 29 mmol/L (ref 20–32)
Calcium: 9.2 mg/dL (ref 8.6–10.4)
Chloride: 99 mmol/L (ref 98–110)
Creat: 1 mg/dL — ABNORMAL HIGH (ref 0.60–0.95)
Globulin: 3.2 g/dL (ref 1.9–3.7)
Glucose, Bld: 119 mg/dL — ABNORMAL HIGH (ref 65–99)
Potassium: 4.7 mmol/L (ref 3.5–5.3)
Sodium: 138 mmol/L (ref 135–146)
Total Bilirubin: 0.7 mg/dL (ref 0.2–1.2)
Total Protein: 7.4 g/dL (ref 6.1–8.1)
eGFR: 55 mL/min/{1.73_m2} — ABNORMAL LOW (ref >=60)

## 2023-07-11 LAB — CBC WITH DIFFERENTIAL/PLATELET
Absolute Lymphocytes: 2519 {cells}/uL (ref 850–3900)
Absolute Monocytes: 614 {cells}/uL (ref 200–950)
Basophils Absolute: 41 {cells}/uL (ref 0–200)
Basophils Relative: 0.6 %
Eosinophils Absolute: 207 {cells}/uL (ref 15–500)
Eosinophils Relative: 3 %
HCT: 44.5 % (ref 35.0–45.0)
Hemoglobin: 14.4 g/dL (ref 11.7–15.5)
MCH: 27.6 pg (ref 27.0–33.0)
MCHC: 32.4 g/dL (ref 32.0–36.0)
MCV: 85.2 fL (ref 80.0–100.0)
MPV: 11.1 fL (ref 7.5–12.5)
Monocytes Relative: 8.9 %
Neutro Abs: 3519 {cells}/uL (ref 1500–7800)
Neutrophils Relative %: 51 %
Platelets: 238 10*3/uL (ref 140–400)
RBC: 5.22 10*6/uL — ABNORMAL HIGH (ref 3.80–5.10)
RDW: 13 % (ref 11.0–15.0)
Total Lymphocyte: 36.5 %
WBC: 6.9 10*3/uL (ref 3.8–10.8)

## 2023-07-12 ENCOUNTER — Other Ambulatory Visit: Payer: Self-pay

## 2023-07-12 ENCOUNTER — Encounter: Payer: Self-pay | Admitting: Family Medicine

## 2023-07-12 ENCOUNTER — Telehealth: Payer: Self-pay

## 2023-07-12 DIAGNOSIS — J309 Allergic rhinitis, unspecified: Secondary | ICD-10-CM

## 2023-07-12 LAB — CULTURE, GROUP A STREP
Micro Number: 16454542
SPECIMEN QUALITY:: ADEQUATE

## 2023-07-12 LAB — URINE CULTURE
MICRO NUMBER:: 16454822
Result:: NO GROWTH
SPECIMEN QUALITY:: ADEQUATE

## 2023-07-12 LAB — STREP GROUP A AG, W/REFLEX TO CULT: Streptococcus Group A AG: NOT DETECTED

## 2023-07-12 LAB — SARS-COV-2 RNA, INFLUENZA A/B, AND RSV RNA, QUALITATIVE NAAT
INFLUENZA A RNA: NOT DETECTED
INFLUENZA B RNA: NOT DETECTED
RSV RNA: NOT DETECTED
SARS COV2 RNA: NOT DETECTED

## 2023-07-12 NOTE — Telephone Encounter (Signed)
 Copied from CRM [id number]. Topic: Clinical - Medical Advice >> Jul 12, 2023  8:49 AM Carlatta H wrote: Reason for CRM: Patient would like to go ahead and have the xray done and annie pen//

## 2023-07-17 ENCOUNTER — Telehealth: Payer: Self-pay

## 2023-07-17 ENCOUNTER — Ambulatory Visit (HOSPITAL_COMMUNITY)
Admission: RE | Admit: 2023-07-17 | Discharge: 2023-07-17 | Disposition: A | Discharge status: Home / Self Care | Source: Ambulatory Visit | Attending: Family Medicine | Admitting: Family Medicine

## 2023-07-17 ENCOUNTER — Ambulatory Visit: Payer: Self-pay | Admitting: Family Medicine

## 2023-07-17 DIAGNOSIS — I700 Atherosclerosis of aorta: Secondary | ICD-10-CM | POA: Diagnosis not present

## 2023-07-17 DIAGNOSIS — R059 Cough, unspecified: Secondary | ICD-10-CM | POA: Diagnosis not present

## 2023-07-17 DIAGNOSIS — J309 Allergic rhinitis, unspecified: Secondary | ICD-10-CM | POA: Insufficient documentation

## 2023-07-17 DIAGNOSIS — R0602 Shortness of breath: Secondary | ICD-10-CM | POA: Diagnosis not present

## 2023-07-17 NOTE — Telephone Encounter (Signed)
 Copied from CRM [id number]. Topic: Appointments - Scheduling Inquiry for Clinic >> Jul 17, 2023 11:58 AM El Gravely T wrote: Reason for CRM: Patient calling regarding chest xray appointment.  Per patient she hasn't heard from the office regarding an appointment to have a chest xray done.   Per patient, [facility] is the preferred location.  Please call patient at [phone] with further directives.

## 2023-08-05 DIAGNOSIS — K080 Exfoliation of teeth due to systemic causes: Secondary | ICD-10-CM | POA: Diagnosis not present

## 2023-08-09 DIAGNOSIS — K080 Exfoliation of teeth due to systemic causes: Secondary | ICD-10-CM | POA: Diagnosis not present

## 2023-08-12 DIAGNOSIS — K080 Exfoliation of teeth due to systemic causes: Secondary | ICD-10-CM | POA: Diagnosis not present

## 2023-08-20 DIAGNOSIS — K080 Exfoliation of teeth due to systemic causes: Secondary | ICD-10-CM | POA: Diagnosis not present

## 2023-09-04 ENCOUNTER — Other Ambulatory Visit: Payer: Self-pay | Admitting: Internal Medicine

## 2023-10-15 DIAGNOSIS — H353132 Nonexudative age-related macular degeneration, bilateral, intermediate dry stage: Secondary | ICD-10-CM | POA: Diagnosis not present

## 2023-10-15 DIAGNOSIS — H43812 Vitreous degeneration, left eye: Secondary | ICD-10-CM | POA: Diagnosis not present

## 2023-10-15 DIAGNOSIS — H18513 Endothelial corneal dystrophy, bilateral: Secondary | ICD-10-CM | POA: Diagnosis not present

## 2023-10-15 DIAGNOSIS — Z961 Presence of intraocular lens: Secondary | ICD-10-CM | POA: Diagnosis not present

## 2023-10-15 DIAGNOSIS — H02011 Cicatricial entropion of right upper eyelid: Secondary | ICD-10-CM | POA: Diagnosis not present

## 2023-10-22 ENCOUNTER — Encounter: Payer: Self-pay | Admitting: Family Medicine

## 2023-10-22 ENCOUNTER — Ambulatory Visit (INDEPENDENT_AMBULATORY_CARE_PROVIDER_SITE_OTHER): Admitting: Family Medicine

## 2023-10-22 VITALS — BP 120/72 | HR 67 | Temp 97.6°F | Ht 67.0 in | Wt 140.8 lb

## 2023-10-22 DIAGNOSIS — M25552 Pain in left hip: Secondary | ICD-10-CM | POA: Diagnosis not present

## 2023-10-22 DIAGNOSIS — M19049 Primary osteoarthritis, unspecified hand: Secondary | ICD-10-CM

## 2023-10-22 MED ORDER — PREDNISONE 20 MG PO TABS: ORAL_TABLET | ORAL | 0 refills | Status: DC

## 2023-10-22 NOTE — Progress Notes (Signed)
 Subjective:    Patient ID: Lauren House, female    DOB: 1936/02/17, 88 y.o.   MRN: [id number]  Patient is a very sweet 88 year old Caucasian female with hypertrophic cardiomyopathy and coronary artery disease currently on Ranexa .  For that reason we want to avoid NSAIDs.  She presents with pain and tenderness at the base of her right thumb.  The pain is primarily in the first [facility] joint as well as the first MCP joint.  She has a negative Finkelstein maneuver.  There is some swelling and some warmth in both joints.  There is tenderness to palpation in both joints.  There is pain with range of motion in both joints.  She denies any recent fall or injury.  She also complains of posterior left hip pain.  The pain does not radiate left leg.  She denies any numbness or tingling in the left leg.  She denies any burning or stinging in the left leg.  The pain is primarily in the gluteus muscle.  She denies any back pain.  Movement and standing exacerbate the pain.  Sitting relieves the pain. Past Medical History:  Diagnosis Date   Anxiety    Apical variant hypertrophic cardiomyopathy (HCC)    Arthritis    Borderline diabetes mellitus    Bruises easily    Bursitis of left hip    Coronary atherosclerosis of native coronary artery    a. 2011: Cath showing nonobstructive CAD - 50-60% LAD (normal FFR) b. 06/2015: Cath showing 80% stenosis in the Mid LAD, tx w/ Promus DES 2.25 mm x 16 mm    Essential hypertension, benign    History of kidney stones    History of skin cancer    Basal cell - abdomen   Hyperlipidemia    Diet controlled.cannot take meds   Nocturia    Osteoporosis    Overactive bladder    Shortness of breath dyspnea    Vertigo    Past Surgical History:  Procedure Laterality Date   ABDOMINAL HYSTERECTOMY     CARDIAC CATHETERIZATION N/A 07/22/2015   Procedure: Left Heart Cath and Coronary Angiography;  Surgeon: Alm LELON Clay, MD;  Location: [facility] INVASIVE CV LAB;  Service: Cardiovascular;   Laterality: N/A;   CARDIAC CATHETERIZATION N/A 07/22/2015   Procedure: Coronary Stent Intervention;  Surgeon: Alm LELON Clay, MD;  Location: [facility] INVASIVE CV LAB;  Service: Cardiovascular;  Laterality: N/A;   CORONARY STENT PLACEMENT  07/22/2015   mid lad  des    EYE SURGERY     cataracts bilateral   HEMORRHOID SURGERY     INGUINAL HERNIA REPAIR  10/11/2011   Procedure: HERNIA REPAIR INGUINAL ADULT;  Surgeon: Jina Nephew, MD;  Location: [facility] OR;  Service: General;  Laterality: Right;   OPEN SURGICAL REPAIR OF GLUTEAL TENDON Left 03/12/2014   Procedure: LEFT HIP BURSECTOMY AND GLUTEAL TENDON REPAIR;  Surgeon: Dempsey Melodi GAILS, MD;  Location: [facility] ORS;  Service: Orthopedics;  Laterality: Left;    Allergies  Allergen Reactions   Chlorhexidine  Itching   Vancomycin  Hives   Aspirin  Other (See Comments)    Nose bleeds with full strength   Atorvastatin Other (See Comments)    Muscle pain   Atorvastatin Calcium Other (See Comments)   Bactrim [Sulfamethoxazole -Trimethoprim] Other (See Comments)    Doesn't remember    Cephalosporins Hives and Itching   Ciprofloxacin Nausea Only   Clarithromycin Other (See Comments)    Doesn't remember   Colestipol Hcl Other (See Comments)  Ezetimibe Other (See Comments)    GI upset   Penicillamine Other (See Comments)   Prednisone     Simvastatin Other (See Comments)    Muscle spasms   Penicillins Rash    Has patient had a PCN reaction causing immediate rash, facial/tongue/throat swelling, SOB or lightheadedness with hypotension: yes Has patient had a PCN reaction causing severe rash involving mucus membranes or skin necrosis: no Has patient had a PCN reaction that required hospitalization no Has patient had a PCN reaction occurring within the last 10 years: no If all of the above answers are NO, then may proceed with Cephalosporin use.    Tape Rash and Other (See Comments)   Social History   Socioeconomic History   Marital status: Widowed    Spouse  name: Not on file   Number of children: 4   Years of education: 62   Highest education level: 12th grade  Occupational History   Not on file  Tobacco Use   Smoking status: Never    Passive exposure: Never   Smokeless tobacco: Never  Vaping Use   Vaping status: Never Used  Substance and Sexual Activity   Alcohol use: No   Drug use: No   Sexual activity: Not Currently  Other Topics Concern   Not on file  Social History Narrative   3 sons, 1 daughter. 1 son died in 02-Nov-2016 (unexpectedly)   Husband died in Nov 03, 2014.   6 grandchildren    10 great grandchildren   Social Drivers of Corporate investment banker Strain: Low Risk  (06/06/2023)   Overall Financial Resource Strain (CARDIA)    Difficulty of Paying Living Expenses: Not hard at all  Food Insecurity: No Food Insecurity (06/06/2023)   Hunger Vital Sign    Worried About Running Out of Food in the Last Year: Never true    Ran Out of Food in the Last Year: Never true  Transportation Needs: No Transportation Needs (06/06/2023)   PRAPARE - Administrator, Civil Service (Medical): No    Lack of Transportation (Non-Medical): No  Physical Activity: Insufficiently Active (06/06/2023)   Exercise Vital Sign    Days of Exercise per Week: 3 days    Minutes of Exercise per Session: 30 min  Stress: No Stress Concern Present (06/06/2023)   [employer] of Occupational Health - Occupational Stress Questionnaire    Feeling of Stress : Not at all  Social Connections: Moderately Integrated (06/06/2023)   Social Connection and Isolation Panel    Frequency of Communication with Friends and Family: More than three times a week    Frequency of Social Gatherings with Friends and Family: More than three times a week    Attends Religious Services: More than 4 times per year    Active Member of [employer] or Organizations: Yes    Attends Banker Meetings: More than 4 times per year    Marital Status: Widowed  Intimate Partner  Violence: Not At Risk (06/06/2023)   Humiliation, Afraid, Rape, and Kick questionnaire    Fear of Current or Ex-Partner: No    Emotionally Abused: No    Physically Abused: No    Sexually Abused: No   Family History  Problem Relation Age of Onset   Heart disease Father    Lung cancer Sister    Lung cancer Brother       Review of Systems     Objective:   Physical Exam Vitals reviewed.  Constitutional:  General: She is not in acute distress.    Appearance: Normal appearance. She is normal weight. She is not ill-appearing, toxic-appearing or diaphoretic.  HENT:     Head: Normocephalic and atraumatic.  Neck:     Vascular: No carotid bruit.  Cardiovascular:     Rate and Rhythm: Normal rate and regular rhythm.     Heart sounds: Normal heart sounds. No murmur heard.    No friction rub. No gallop.  Pulmonary:     Effort: Pulmonary effort is normal. No respiratory distress.     Breath sounds: No stridor. No wheezing or rhonchi.  Musculoskeletal:     Right wrist: Tenderness present. No effusion.     Right hand: Swelling and tenderness present. Decreased range of motion.       Hands:     Cervical back: Neck supple. No rigidity or tenderness.       Back:     Right hip: Normal range of motion.     Left hip: No tenderness, bony tenderness or crepitus. Normal range of motion. Normal strength.       Legs:  Lymphadenopathy:     Cervical: No cervical adenopathy.  Skin:    General: Skin is dry.  Neurological:     General: No focal deficit present.     Mental Status: She is alert and oriented to person, place, and time. Mental status is at baseline.     Cranial Nerves: No cranial nerve deficit.           Assessment & Plan:  [facility] arthritis  Posterior pain of left hip Patient has [facility] arthritis based on exam findings.  Begin prednisone  taper pack.  Consult orthopedics.  She already has an appointment to see a hand specialist in approximately 6 weeks.  If the prednisone  is  not helping at that point hopefully a cortisone injection would.  I see no evidence of any fracture based on her history.  I believe the posterior left hip pain is likely muscular.  Less likely would be sciatica.  There does not appear to be any pathology in the hip joint based on her exam hopefully prednisone  will work as an anti-inflammatory and help with the muscle pain in the posterior hip.  Recheck in 2 weeks if no better or sooner if worse

## 2023-11-06 NOTE — Progress Notes (Unsigned)
 Cardiology Office Note    Date:  11/07/2023  ID:  Lauren House, DOB [birth year], MRN [id number] PCP:  Duanne Butler DASEN, MD  Cardiologist:  Vinie JAYSON Maxcy, MD  Electrophysiologist:  None   Chief Complaint: f/u CAD, HCM  History of Present Illness: .    Lauren House is a 88 y.o. female with visit-pertinent history of apical variant hypertrophic cardiomyopathy, CAD s/p DES to LAD 2017, mild MR, varicose veins, venous insufficiency, anxiety, borderline DM, arthritis, HTN, suspected familial HLD with multiple medication intolerances seen for follow-up. Last cath was in 2017 s/p DES to mLAD with residual 50% oLAD and mild-moderate disease in the Cx/RCA treated medically. Last echo 03/2022 showed EF 60-65%, severe LVH of septal segment, G1DD, moderately increased RV thickness, mild MR. She has been intolerant of statins in the past and could not take Repatha  due to concerns for cost. She has not wished to pursue other options due to cost. She lost her husband 10 years ago and his pension ran out 2 years ago so she prefers to simplify her medicine regimen.  She is seen for follow-up doing well. She reports chronic unchanged dyspnea with higher levels of exertion. No CP or syncope. She has 1 daughter, 2 sons in their 92s (1 son died of esophageal cancer). She reports they also keep up with their health maintenance. Her main concern is seeing hand ortho soon for chronic hand issues.    Labwork independently reviewed: 06/2023 K 4.7, Cr 1, LFTs ok, Hgb 14.4, plt ok 03/2022 LDL 221, trig 145 11/2021 Mg OK 2017 TSH wnl  ROS: .    Please see the history of present illness. All other systems are reviewed and otherwise negative.  Studies Reviewed: SABRA    EKG:  EKG is ordered today, personally reviewed, demonstrating:   EKG Interpretation Date/Time:  Thursday November 07 2023 15:06:07 EDT Ventricular Rate:  67 PR Interval:  166 QRS Duration:  88 QT Interval:  418 QTC Calculation: 441 R  Axis:   44  Text Interpretation: Normal sinus rhythm Minimal voltage criteria for left ventricular hypertrophy Nonspecific ST and T wave abnormality Confirmed by Chloeann Alfred [phone]) on 11/07/2023 3:10:07 PM         CV Studies: Cardiac studies reviewed are outlined and summarized above. Otherwise please see EMR for full report.   Current Reported Medications:.    Current Meds  Medication Sig   aspirin  EC 81 MG tablet Take 81 mg by mouth daily.   Butalbital -APAP-Caffeine  50-300-40 MG CAPS Take 1 tablet by mouth every 6 (six) hours as needed (migraine).   Cyanocobalamin (VITAMIN B12 PO) Take 1 tablet by mouth daily.   fluticasone  (FLONASE ) 50 MCG/ACT nasal spray USE TWO SPRAY IN EACH NOSTRIL EVERY DAY   methocarbamol  (ROBAXIN -750) 750 MG tablet Take 1 tablet (750 mg total) by mouth at bedtime as needed for muscle spasms.   metoprolol  tartrate (LOPRESSOR ) 25 MG tablet Take 1 tablet by mouth twice daily (Patient taking differently: Take 25 mg by mouth daily.)   Multiple Vitamins-Minerals (OCUVITE PO) Take 1 tablet by mouth daily.   nitroGLYCERIN  (NITROSTAT ) 0.4 MG SL tablet Place 1 tablet (0.4 mg total) under the tongue every 5 (five) minutes as needed for chest pain.   Polyethyl Glycol-Propyl Glycol (SYSTANE OP) Apply 1 drop to eye 3 (three) times daily as needed (Dry eyes).   predniSONE  (DELTASONE ) 20 MG tablet 3 tabs poqday 1-2, 2 tabs poqday 3-4, 1 tab poqday 5-6   ranolazine  (  RANEXA ) 500 MG 12 hr tablet Take 1 tablet (500 mg total) by mouth 2 (two) times daily.   senna-docusate (SENOKOT-S) 8.6-50 MG tablet Take 1 tablet by mouth at bedtime as needed for mild constipation or moderate constipation.    Physical Exam:    VS:  BP 122/70 (BP Location: Left Arm, Cuff Size: Normal)   Pulse 68   Ht 5' 7.5 (1.715 m)   Wt 141 lb 6.4 oz (64.1 kg)   SpO2 98%   BMI 21.82 kg/m    Wt Readings from Last 3 Encounters:  11/07/23 141 lb 6.4 oz (64.1 kg)  10/22/23 140 lb 12.8 oz (63.9 kg)   07/10/23 138 lb 6 oz (62.8 kg)    GEN: Well nourished, well developed in no acute distress NECK: No JVD; No carotid bruits CARDIAC: RRR, no murmurs, rubs, gallops RESPIRATORY:  Clear to auscultation without rales, wheezing or rhonchi  ABDOMEN: Soft, non-tender, non-distended EXTREMITIES:  No edema; No acute deformity   Asessement and Plan:.     1. CAD, HLD with h/o statin intolerance - doing well without progressive angina. Continue ASA 81mg  daily. She reports she has chronically only been taking Lopressor  once a day because BID upsets her stomach. Will change her to equivalent dose of Toprol  25mg  daily for better lasting coverage. She continues to decline consideration of other lipid lowering agents and is statin intolerant. As she declines further therapy, hold off repeating lipid panel. Can reconsider if she changes her mind.  2. Apical variant hypertrophic cardiomyopathy - historically maintained on Ranexa  by Dr. Mona and doing well. QTc stable. See above regarding consolidation of beta blocker to Toprol  for once-daily dosing. I reached out to Dr. Mona regarding whether apical HCM warrants genetic screening in her children; he would like me to involve Dr. Santo in this discussion therefore staff message sent.   3. Essential HTN - controlled. Follow on present regimen. Do not anticipate dramatic change with adjusting Lopressor  25mg  daily to Toprol  25mg  daily.    Disposition: Patient reports the drive to [place] is getting tougher. She prefers [place] location, will set her up with Dr. Alvan or Dr. Mallipeddi in 1 year.  Signed, Delanee Xin N Morene Cecilio, PA-C

## 2023-11-07 ENCOUNTER — Encounter: Payer: Self-pay | Admitting: Physician Assistant

## 2023-11-07 ENCOUNTER — Ambulatory Visit: Attending: Physician Assistant | Admitting: Physician Assistant

## 2023-11-07 ENCOUNTER — Telehealth: Payer: Self-pay | Admitting: Physician Assistant

## 2023-11-07 VITALS — BP 122/70 | HR 68 | Ht 67.5 in | Wt 141.4 lb

## 2023-11-07 DIAGNOSIS — E785 Hyperlipidemia, unspecified: Secondary | ICD-10-CM | POA: Diagnosis not present

## 2023-11-07 DIAGNOSIS — I422 Other hypertrophic cardiomyopathy: Secondary | ICD-10-CM | POA: Diagnosis not present

## 2023-11-07 DIAGNOSIS — I10 Essential (primary) hypertension: Secondary | ICD-10-CM | POA: Diagnosis not present

## 2023-11-07 DIAGNOSIS — I2510 Atherosclerotic heart disease of native coronary artery without angina pectoris: Secondary | ICD-10-CM | POA: Diagnosis not present

## 2023-11-07 MED ORDER — METOPROLOL SUCCINATE ER 25 MG PO TB24: 25.0000 mg | ORAL_TABLET | Freq: Every day | ORAL | 3 refills | Status: DC

## 2023-11-07 NOTE — Telephone Encounter (Signed)
 Please let Lauren House know I heard back from one of the doctors Dr. Mona wanted to me to discuss genetic testing with. Per Dr. Santo, he would recommend hypertrophic cardiomyopathy genetic testing if she and her family are amenable to it. It will not affect her care, but if would help her remaining daughter and sons for screening. If she would like to proceed, please refer her to Lorriane Pac with our genetics team to discuss further screening. If she would like to hold off, just make sure that her children mention her diagnosis to their own medical providers.

## 2023-11-07 NOTE — Patient Instructions (Signed)
 Medication Instructions:  Your physician recommends that you continue on your current medications as directed. Please refer to the Current Medication list given to you today.  *If you need a refill on your cardiac medications before your next appointment, please call your pharmacy*  Lab Work: NONE   If you have labs (blood work) drawn today and your tests are completely normal, you will receive your results only by: MyChart Message (if you have MyChart) OR A paper copy in the mail If you have any lab test that is abnormal or we need to change your treatment, we will call you to review the results.  Testing/Procedures: NONE   Follow-Up: At [facility], you and your health needs are our priority.  As part of our continuing mission to provide you with exceptional heart care, our providers are all part of one team.  This team includes your primary Cardiologist (physician) and Advanced Practice Providers or APPs (Physician Assistants and Nurse Practitioners) who all work together to provide you with the care you need, when you need it.  Your next appointment:   1 year(s)  Provider:   Dorn Ross, MD or Vishnu Mallipeddi, MD    We recommend signing up for the patient portal called MyChart.  Sign up information is provided on this After Visit Summary.  MyChart is used to connect with patients for Virtual Visits (Telemedicine).  Patients are able to view lab/test results, encounter notes, upcoming appointments, etc.  Non-urgent messages can be sent to your provider as well.   To learn more about what you can do with MyChart, go to [email or web address].   Other Instructions Thank you for choosing [facility]!

## 2023-11-08 NOTE — Telephone Encounter (Signed)
 Spoke with pt and she declines testing at this time. Pt has spoke with daughter who also declines at this time. She will speak with her son. And have them mention it with their providers.

## 2023-11-19 DIAGNOSIS — L821 Other seborrheic keratosis: Secondary | ICD-10-CM | POA: Diagnosis not present

## 2023-11-22 DIAGNOSIS — M19031 Primary osteoarthritis, right wrist: Secondary | ICD-10-CM | POA: Diagnosis not present

## 2023-11-22 DIAGNOSIS — M65931 Unspecified synovitis and tenosynovitis, right forearm: Secondary | ICD-10-CM | POA: Diagnosis not present

## 2023-11-22 DIAGNOSIS — M79641 Pain in right hand: Secondary | ICD-10-CM | POA: Diagnosis not present

## 2023-11-23 DIAGNOSIS — M19031 Primary osteoarthritis, right wrist: Secondary | ICD-10-CM | POA: Insufficient documentation

## 2023-12-10 DIAGNOSIS — K080 Exfoliation of teeth due to systemic causes: Secondary | ICD-10-CM | POA: Diagnosis not present

## 2023-12-13 DIAGNOSIS — M19031 Primary osteoarthritis, right wrist: Secondary | ICD-10-CM | POA: Diagnosis not present

## 2023-12-13 DIAGNOSIS — M65931 Unspecified synovitis and tenosynovitis, right forearm: Secondary | ICD-10-CM | POA: Diagnosis not present

## 2024-01-02 DIAGNOSIS — M25552 Pain in left hip: Secondary | ICD-10-CM | POA: Diagnosis not present

## 2024-01-05 ENCOUNTER — Emergency Department (HOSPITAL_COMMUNITY)

## 2024-01-05 ENCOUNTER — Other Ambulatory Visit: Payer: Self-pay

## 2024-01-05 ENCOUNTER — Inpatient Hospital Stay (HOSPITAL_COMMUNITY)
Admission: EM | Admit: 2024-01-05 | Discharge: 2024-01-09 | DRG: 480 | Disposition: A | Discharge status: Skilled Nursing Facility | Attending: Internal Medicine | Admitting: Internal Medicine

## 2024-01-05 ENCOUNTER — Encounter (HOSPITAL_COMMUNITY): Payer: Self-pay | Admitting: Emergency Medicine

## 2024-01-05 DIAGNOSIS — I10 Essential (primary) hypertension: Secondary | ICD-10-CM | POA: Diagnosis present

## 2024-01-05 DIAGNOSIS — R1310 Dysphagia, unspecified: Secondary | ICD-10-CM | POA: Diagnosis not present

## 2024-01-05 DIAGNOSIS — R2681 Unsteadiness on feet: Secondary | ICD-10-CM | POA: Diagnosis not present

## 2024-01-05 DIAGNOSIS — N3281 Overactive bladder: Secondary | ICD-10-CM | POA: Diagnosis not present

## 2024-01-05 DIAGNOSIS — E869 Volume depletion, unspecified: Secondary | ICD-10-CM | POA: Diagnosis not present

## 2024-01-05 DIAGNOSIS — Z96698 Presence of other orthopedic joint implants: Secondary | ICD-10-CM | POA: Diagnosis not present

## 2024-01-05 DIAGNOSIS — M810 Age-related osteoporosis without current pathological fracture: Secondary | ICD-10-CM | POA: Diagnosis present

## 2024-01-05 DIAGNOSIS — Z955 Presence of coronary angioplasty implant and graft: Secondary | ICD-10-CM | POA: Diagnosis not present

## 2024-01-05 DIAGNOSIS — Z91048 Other nonmedicinal substance allergy status: Secondary | ICD-10-CM | POA: Diagnosis not present

## 2024-01-05 DIAGNOSIS — J9811 Atelectasis: Secondary | ICD-10-CM | POA: Diagnosis not present

## 2024-01-05 DIAGNOSIS — W19XXXA Unspecified fall, initial encounter: Secondary | ICD-10-CM

## 2024-01-05 DIAGNOSIS — Z7982 Long term (current) use of aspirin: Secondary | ICD-10-CM

## 2024-01-05 DIAGNOSIS — Z23 Encounter for immunization: Secondary | ICD-10-CM | POA: Diagnosis present

## 2024-01-05 DIAGNOSIS — Z79899 Other long term (current) drug therapy: Secondary | ICD-10-CM | POA: Diagnosis not present

## 2024-01-05 DIAGNOSIS — J9859 Other diseases of mediastinum, not elsewhere classified: Secondary | ICD-10-CM | POA: Diagnosis not present

## 2024-01-05 DIAGNOSIS — F419 Anxiety disorder, unspecified: Secondary | ICD-10-CM | POA: Diagnosis not present

## 2024-01-05 DIAGNOSIS — M160 Bilateral primary osteoarthritis of hip: Secondary | ICD-10-CM | POA: Diagnosis not present

## 2024-01-05 DIAGNOSIS — Z880 Allergy status to penicillin: Secondary | ICD-10-CM

## 2024-01-05 DIAGNOSIS — Z883 Allergy status to other anti-infective agents status: Secondary | ICD-10-CM

## 2024-01-05 DIAGNOSIS — E785 Hyperlipidemia, unspecified: Secondary | ICD-10-CM | POA: Diagnosis not present

## 2024-01-05 DIAGNOSIS — M47816 Spondylosis without myelopathy or radiculopathy, lumbar region: Secondary | ICD-10-CM | POA: Diagnosis not present

## 2024-01-05 DIAGNOSIS — J9601 Acute respiratory failure with hypoxia: Secondary | ICD-10-CM | POA: Diagnosis present

## 2024-01-05 DIAGNOSIS — Y92008 Other place in unspecified non-institutional (private) residence as the place of occurrence of the external cause: Secondary | ICD-10-CM | POA: Diagnosis not present

## 2024-01-05 DIAGNOSIS — R079 Chest pain, unspecified: Secondary | ICD-10-CM | POA: Diagnosis not present

## 2024-01-05 DIAGNOSIS — R262 Difficulty in walking, not elsewhere classified: Secondary | ICD-10-CM | POA: Diagnosis not present

## 2024-01-05 DIAGNOSIS — R918 Other nonspecific abnormal finding of lung field: Secondary | ICD-10-CM | POA: Diagnosis not present

## 2024-01-05 DIAGNOSIS — Z4789 Encounter for other orthopedic aftercare: Secondary | ICD-10-CM | POA: Diagnosis not present

## 2024-01-05 DIAGNOSIS — Z8249 Family history of ischemic heart disease and other diseases of the circulatory system: Secondary | ICD-10-CM | POA: Diagnosis not present

## 2024-01-05 DIAGNOSIS — Z886 Allergy status to analgesic agent status: Secondary | ICD-10-CM | POA: Diagnosis not present

## 2024-01-05 DIAGNOSIS — Z888 Allergy status to other drugs, medicaments and biological substances status: Secondary | ICD-10-CM | POA: Diagnosis not present

## 2024-01-05 DIAGNOSIS — R42 Dizziness and giddiness: Secondary | ICD-10-CM | POA: Diagnosis not present

## 2024-01-05 DIAGNOSIS — S72142D Displaced intertrochanteric fracture of left femur, subsequent encounter for closed fracture with routine healing: Secondary | ICD-10-CM | POA: Diagnosis not present

## 2024-01-05 DIAGNOSIS — S72002A Fracture of unspecified part of neck of left femur, initial encounter for closed fracture: Secondary | ICD-10-CM | POA: Diagnosis present

## 2024-01-05 DIAGNOSIS — Z801 Family history of malignant neoplasm of trachea, bronchus and lung: Secondary | ICD-10-CM | POA: Diagnosis not present

## 2024-01-05 DIAGNOSIS — Z881 Allergy status to other antibiotic agents status: Secondary | ICD-10-CM | POA: Diagnosis not present

## 2024-01-05 DIAGNOSIS — I2510 Atherosclerotic heart disease of native coronary artery without angina pectoris: Secondary | ICD-10-CM | POA: Diagnosis not present

## 2024-01-05 DIAGNOSIS — Z85828 Personal history of other malignant neoplasm of skin: Secondary | ICD-10-CM

## 2024-01-05 DIAGNOSIS — W1839XA Other fall on same level, initial encounter: Secondary | ICD-10-CM | POA: Diagnosis present

## 2024-01-05 DIAGNOSIS — E119 Type 2 diabetes mellitus without complications: Secondary | ICD-10-CM | POA: Diagnosis present

## 2024-01-05 DIAGNOSIS — R41841 Cognitive communication deficit: Secondary | ICD-10-CM | POA: Diagnosis not present

## 2024-01-05 DIAGNOSIS — M6281 Muscle weakness (generalized): Secondary | ICD-10-CM | POA: Diagnosis not present

## 2024-01-05 DIAGNOSIS — E041 Nontoxic single thyroid nodule: Secondary | ICD-10-CM | POA: Diagnosis not present

## 2024-01-05 DIAGNOSIS — Z90710 Acquired absence of both cervix and uterus: Secondary | ICD-10-CM | POA: Diagnosis not present

## 2024-01-05 DIAGNOSIS — I700 Atherosclerosis of aorta: Secondary | ICD-10-CM | POA: Diagnosis not present

## 2024-01-05 DIAGNOSIS — J398 Other specified diseases of upper respiratory tract: Secondary | ICD-10-CM | POA: Diagnosis present

## 2024-01-05 DIAGNOSIS — I25119 Atherosclerotic heart disease of native coronary artery with unspecified angina pectoris: Secondary | ICD-10-CM | POA: Diagnosis not present

## 2024-01-05 DIAGNOSIS — S72142A Displaced intertrochanteric fracture of left femur, initial encounter for closed fracture: Secondary | ICD-10-CM | POA: Diagnosis not present

## 2024-01-05 DIAGNOSIS — M25552 Pain in left hip: Secondary | ICD-10-CM | POA: Diagnosis not present

## 2024-01-05 DIAGNOSIS — Z9181 History of falling: Secondary | ICD-10-CM | POA: Diagnosis not present

## 2024-01-05 DIAGNOSIS — K4090 Unilateral inguinal hernia, without obstruction or gangrene, not specified as recurrent: Secondary | ICD-10-CM | POA: Diagnosis not present

## 2024-01-05 DIAGNOSIS — R9389 Abnormal findings on diagnostic imaging of other specified body structures: Secondary | ICD-10-CM | POA: Diagnosis not present

## 2024-01-05 DIAGNOSIS — M1612 Unilateral primary osteoarthritis, left hip: Secondary | ICD-10-CM | POA: Diagnosis not present

## 2024-01-05 DIAGNOSIS — I4891 Unspecified atrial fibrillation: Secondary | ICD-10-CM | POA: Diagnosis not present

## 2024-01-05 LAB — COMPREHENSIVE METABOLIC PANEL WITH GFR
ALT: 15 U/L (ref 0–44)
AST: 26 U/L (ref 15–41)
Albumin: 4.2 g/dL (ref 3.5–5.0)
Alkaline Phosphatase: 70 U/L (ref 38–126)
Anion gap: 12 (ref 5–15)
BUN: 18 mg/dL (ref 8–23)
CO2: 24 mmol/L (ref 22–32)
Calcium: 8.8 mg/dL — ABNORMAL LOW (ref 8.9–10.3)
Chloride: 103 mmol/L (ref 98–111)
Creatinine, Ser: 0.8 mg/dL (ref 0.44–1.00)
GFR, Estimated: >60 mL/min (ref >60)
Glucose, Bld: 111 mg/dL — ABNORMAL HIGH (ref 70–99)
Potassium: 3.9 mmol/L (ref 3.5–5.1)
Sodium: 139 mmol/L (ref 135–145)
Total Bilirubin: 0.4 mg/dL (ref 0.0–1.2)
Total Protein: 7.1 g/dL (ref 6.5–8.1)

## 2024-01-05 LAB — URINALYSIS, ROUTINE W REFLEX MICROSCOPIC
Bacteria, UA: NONE SEEN
Bilirubin Urine: NEGATIVE
Glucose, UA: NEGATIVE mg/dL
Hgb urine dipstick: NEGATIVE
Ketones, ur: NEGATIVE mg/dL
Nitrite: NEGATIVE
Protein, ur: NEGATIVE mg/dL
Specific Gravity, Urine: 1.024 (ref 1.005–1.030)
pH: 6 (ref 5.0–8.0)

## 2024-01-05 LAB — CBC WITH DIFFERENTIAL/PLATELET
Abs Immature Granulocytes: 0.07 K/uL (ref 0.00–0.07)
Basophils Absolute: 0.1 K/uL (ref 0.0–0.1)
Basophils Relative: 1 %
Eosinophils Absolute: 0 K/uL (ref 0.0–0.5)
Eosinophils Relative: 0 %
HCT: 43.1 % (ref 36.0–46.0)
Hemoglobin: 13.9 g/dL (ref 12.0–15.0)
Immature Granulocytes: 1 %
Lymphocytes Relative: 27 %
Lymphs Abs: 2.6 K/uL (ref 0.7–4.0)
MCH: 28.3 pg (ref 26.0–34.0)
MCHC: 32.3 g/dL (ref 30.0–36.0)
MCV: 87.8 fL (ref 80.0–100.0)
Monocytes Absolute: 0.8 K/uL (ref 0.1–1.0)
Monocytes Relative: 8 %
Neutro Abs: 5.9 K/uL (ref 1.7–7.7)
Neutrophils Relative %: 63 %
Platelets: 224 K/uL (ref 150–400)
RBC: 4.91 MIL/uL (ref 3.87–5.11)
RDW: 13.1 % (ref 11.5–15.5)
WBC: 9.4 K/uL (ref 4.0–10.5)
nRBC: 0 % (ref 0.0–0.2)

## 2024-01-05 LAB — SURGICAL PCR SCREEN
MRSA, PCR: NEGATIVE
Staphylococcus aureus: NEGATIVE

## 2024-01-05 LAB — PROCALCITONIN: Procalcitonin: <0.1 ng/mL

## 2024-01-05 LAB — CBG MONITORING, ED: Glucose-Capillary: 125 mg/dL — ABNORMAL HIGH (ref 70–99)

## 2024-01-05 LAB — TROPONIN T, HIGH SENSITIVITY
Troponin T High Sensitivity: 17 ng/L (ref 0–19)
Troponin T High Sensitivity: 19 ng/L (ref 0–19)

## 2024-01-05 LAB — PRO BRAIN NATRIURETIC PEPTIDE: Pro Brain Natriuretic Peptide: 1064 pg/mL — ABNORMAL HIGH (ref <300.0)

## 2024-01-05 MED ORDER — MORPHINE SULFATE (PF) 2 MG/ML IV SOLN
2.0000 mg | INTRAVENOUS | Status: DC | PRN
Start: 2024-01-05 — End: 2024-01-09
  Administered 2024-01-05: 2 mg via INTRAVENOUS
  Filled 2024-01-05: qty 1

## 2024-01-05 MED ORDER — IOHEXOL 350 MG/ML SOLN
75.0000 mL | Freq: Once | INTRAVENOUS | Status: AC | PRN
  Administered 2024-01-05: 75 mL via INTRAVENOUS

## 2024-01-05 MED ORDER — RANOLAZINE ER 500 MG PO TB12
500.0000 mg | ORAL_TABLET | Freq: Two times a day (BID) | ORAL | Status: DC
  Administered 2024-01-06 – 2024-01-09 (×7): 500 mg via ORAL
  Filled 2024-01-05 (×8): qty 1

## 2024-01-05 MED ORDER — ONDANSETRON HCL 4 MG/2ML IJ SOLN: 4.0000 mg | Freq: Four times a day (QID) | INTRAMUSCULAR | Status: DC | PRN

## 2024-01-05 MED ORDER — INFLUENZA VAC SPLIT HIGH-DOSE 0.5 ML IM SUSY
0.5000 mL | PREFILLED_SYRINGE | INTRAMUSCULAR | Status: AC
  Administered 2024-01-07: 0.5 mL via INTRAMUSCULAR
  Filled 2024-01-05: qty 0.5

## 2024-01-05 MED ORDER — ONDANSETRON HCL 4 MG PO TABS: 4.0000 mg | ORAL_TABLET | Freq: Four times a day (QID) | ORAL | Status: DC | PRN

## 2024-01-05 MED ORDER — METOPROLOL SUCCINATE ER 25 MG PO TB24
25.0000 mg | ORAL_TABLET | Freq: Every day | ORAL | Status: DC
  Administered 2024-01-06 – 2024-01-09 (×4): 25 mg via ORAL
  Filled 2024-01-05 (×4): qty 1

## 2024-01-05 MED ORDER — ACETAMINOPHEN 650 MG RE SUPP: 650.0000 mg | Freq: Four times a day (QID) | RECTAL | Status: DC | PRN

## 2024-01-05 MED ORDER — ACETAMINOPHEN 325 MG PO TABS
650.0000 mg | ORAL_TABLET | Freq: Four times a day (QID) | ORAL | Status: DC | PRN
  Administered 2024-01-06: 650 mg via ORAL
  Filled 2024-01-05: qty 2

## 2024-01-05 MED ORDER — SODIUM CHLORIDE 0.9 % IV BOLUS
500.0000 mL | Freq: Once | INTRAVENOUS | Status: AC
  Administered 2024-01-05: 500 mL via INTRAVENOUS

## 2024-01-05 MED ORDER — OXYCODONE-ACETAMINOPHEN 5-325 MG PO TABS
1.0000 | ORAL_TABLET | Freq: Once | ORAL | Status: AC
  Administered 2024-01-05: 1 via ORAL
  Filled 2024-01-05: qty 1

## 2024-01-05 MED ORDER — POLYETHYLENE GLYCOL 3350 17 G PO PACK: 17.0000 g | PACK | Freq: Every day | ORAL | Status: DC | PRN

## 2024-01-05 MED ORDER — MUPIROCIN 2 % EX OINT
1.0000 | TOPICAL_OINTMENT | Freq: Two times a day (BID) | CUTANEOUS | Status: DC
  Administered 2024-01-05 – 2024-01-09 (×8): 1 via NASAL
  Filled 2024-01-05 (×2): qty 22

## 2024-01-05 MED ORDER — FLUTICASONE PROPIONATE 50 MCG/ACT NA SUSP
2.0000 | Freq: Every day | NASAL | Status: DC
  Administered 2024-01-06 – 2024-01-09 (×4): 2 via NASAL
  Filled 2024-01-05: qty 16

## 2024-01-05 NOTE — ED Notes (Signed)
 Patient placed on 2 L [place] d/t oxygen saturation being 89% on room air.

## 2024-01-05 NOTE — ED Notes (Signed)
 Patient transported to CT

## 2024-01-05 NOTE — ED Triage Notes (Signed)
 Pt states she stood up and felt woozy and fell. Pt now c/o left hip pain. Ems gave pt 4mg  of morphine .

## 2024-01-05 NOTE — ED Provider Notes (Signed)
 [place] EMERGENCY DEPARTMENT AT [facility] Provider Note   CSN: [id number] Arrival date & time: 01/05/24  1606     Patient presents with: Lauren House is a 88 y.o. female.   Patient is an 88 year old female who presents emergency department the chief complaint of left hip pain.  Patient notes that Lauren House stood up after taking a nap and felt lightheaded causing her to fall to the floor.  Patient notes that the lightheadedness has occurred previously and Lauren House has seen her primary care doctor for this and they have been concerned about possible fluctuating glucose levels.  Patient notes that Lauren House has continued to eat and drink at her baseline.  Lauren House denies striking her head during the fall and denies any active dizziness or lightheadedness at this time.  Lauren House notes that Lauren House is having pain to her left hip which is worse with any attempted movement.  Lauren House denies any pain to her neck or back.  Lauren House denies any pain to chest wall or abdomen.  Lauren House denies any numbness, paresthesias or unilateral weakness.   Fall       Prior to Admission medications   Medication Sig Start Date End Date Taking? Authorizing Provider  aspirin  EC 81 MG tablet Take 81 mg by mouth daily.    [provider]  Butalbital -APAP-Caffeine  50-300-40 MG CAPS Take 1 tablet by mouth every 6 (six) hours as needed (migraine). 08/15/20   Duanne Butler DASEN, MD  Cyanocobalamin (VITAMIN B12 PO) Take 1 tablet by mouth daily.    [provider]  fluticasone  (FLONASE ) 50 MCG/ACT nasal spray USE TWO SPRAY IN EACH NOSTRIL EVERY DAY 11/01/15   Duanne Butler DASEN, MD  methocarbamol  (ROBAXIN -750) 750 MG tablet Take 1 tablet (750 mg total) by mouth at bedtime as needed for muscle spasms. 11/27/21   Duanne Butler DASEN, MD  metoprolol  succinate (TOPROL  XL) 25 MG 24 hr tablet Take 1 tablet (25 mg total) by mouth daily. 11/07/23   Dunn, Dayna N, PA-C  Multiple Vitamins-Minerals (OCUVITE PO) Take 1 tablet by mouth daily.     [provider]  nitroGLYCERIN  (NITROSTAT ) 0.4 MG SL tablet Place 1 tablet (0.4 mg total) under the tongue every 5 (five) minutes as needed for chest pain. 08/26/14   Hilty, Vinie BROCKS, MD  Polyethyl Glycol-Propyl Glycol (SYSTANE OP) Apply 1 drop to eye 3 (three) times daily as needed (Dry eyes).    [provider]  predniSONE  (DELTASONE ) 20 MG tablet 3 tabs poqday 1-2, 2 tabs poqday 3-4, 1 tab poqday 5-6 10/22/23   Duanne Butler DASEN, MD  ranolazine  (RANEXA ) 500 MG 12 hr tablet Take 1 tablet (500 mg total) by mouth 2 (two) times daily. 04/15/23   Hilty, Vinie BROCKS, MD  senna-docusate (SENOKOT-S) 8.6-50 MG tablet Take 1 tablet by mouth at bedtime as needed for mild constipation or moderate constipation. 01/16/18   Long, Fonda MATSU, MD    Allergies: Chlorhexidine , Vancomycin , Aspirin , Atorvastatin, Atorvastatin calcium, Bactrim [sulfamethoxazole -trimethoprim], Cephalosporins, Ciprofloxacin, Clarithromycin, Colestipol hcl, Ezetimibe, Penicillamine, Prednisone , Simvastatin, Penicillins, and Tape    Review of Systems  Musculoskeletal:        Left hip pain  All other systems reviewed and are negative.   Updated Vital Signs BP (!) 155/80 (BP Location: Right Arm)   Pulse 70   Temp 98.7 F (37.1 C) (Oral)   Resp 14   Ht 5' 7 (1.702 m)   Wt 67.1 kg   SpO2 93%   BMI 23.18  kg/m   Physical Exam Vitals and nursing note reviewed.  Constitutional:      General: Lauren House is not in acute distress.    Appearance: Normal appearance. Lauren House is not ill-appearing.  HENT:     Head: Normocephalic and atraumatic.     Nose: Nose normal.     Mouth/Throat:     Mouth: Mucous membranes are moist.  Eyes:     Extraocular Movements: Extraocular movements intact.     Conjunctiva/sclera: Conjunctivae normal.     Pupils: Pupils are equal, round, and reactive to light.  Cardiovascular:     Rate and Rhythm: Normal rate and regular rhythm.     Pulses: Normal pulses.     Heart sounds: Normal heart sounds. No  murmur heard.    No gallop.  Pulmonary:     Effort: Pulmonary effort is normal. No respiratory distress.     Breath sounds: Normal breath sounds. No stridor. No wheezing, rhonchi or rales.  Chest:     Chest wall: No tenderness.  Abdominal:     General: Abdomen is flat. Bowel sounds are normal. There is no distension.     Palpations: Abdomen is soft.     Tenderness: There is no abdominal tenderness. There is no guarding.  Musculoskeletal:        General: Normal range of motion.     Cervical back: Normal range of motion and neck supple. No rigidity or tenderness.     Comments: Tender to palpation noted over the left hip, nontender palpation of remainder bilateral lower extremities, pelvis stable to AP lower compression, nontender to palpation over bilateral upper extremities, DP and PT pulses are 2+ distally bilateral lower extremities, sensation intact distally, pain noted to left hip with active and passive range of motion, nontender palpation over thoracic lumbar spine, no step-off or deformity  Skin:    General: Skin is warm and dry.     Findings: No bruising or rash.  Neurological:     General: No focal deficit present.     Mental Status: Lauren House is alert and oriented to person, place, and time. Mental status is at baseline.     Cranial Nerves: No cranial nerve deficit.     Sensory: No sensory deficit.     Motor: No weakness.     Coordination: Coordination normal.  Psychiatric:        Mood and Affect: Mood normal.        Behavior: Behavior normal.        Thought Content: Thought content normal.        Judgment: Judgment normal.     (all labs ordered are listed, but only abnormal results are displayed) Labs Reviewed  CBG MONITORING, ED - Abnormal; Notable for the following components:      Result Value   Glucose-Capillary 125 (*)    All other components within normal limits  COMPREHENSIVE METABOLIC PANEL WITH GFR  CBC WITH DIFFERENTIAL/PLATELET  URINALYSIS, ROUTINE W REFLEX  MICROSCOPIC  TROPONIN T, HIGH SENSITIVITY    EKG: None  Radiology: No results found.   Procedures   Medications Ordered in the ED  sodium chloride  0.9 % bolus 500 mL (500 mLs Intravenous Bolus 01/05/24 1632)                                    Medical Decision Making Amount and/or Complexity of Data Reviewed Labs: ordered. Radiology: ordered.  Risk Prescription  drug management. Decision regarding hospitalization.   This patient presents to the ED for concern of fall, left hip pain, this involves an extensive number of treatment options, and is a complaint that carries with it a high risk of complications and morbidity.  The differential diagnosis includes long bone or joint fracture, contusion, vertebral fracture, pelvic fracture, rib fracture, intracranial hemorrhage   Co morbidities that complicate the patient evaluation  CAD   Additional history obtained:  Additional history obtained from none External records from outside source obtained and reviewed including none   Lab Tests:  I Ordered, and personally interpreted labs.  The pertinent results include: No leukocytosis, no anemia, normal kidney function liver function, unremarkable electrolytes, negative troponin   Imaging Studies ordered:  I ordered imaging studies including CT scan head, left hip, CTA chest I independently visualized and interpreted imaging which showed no acute intracranial process, left intertrochanteric hip fracture, no acute traumatic process within the chest I agree with the radiologist interpretation   Cardiac Monitoring: / EKG:  The patient was maintained on a cardiac monitor.  I personally viewed and interpreted the cardiac monitored which showed an underlying rhythm of: Normal sinus rhythm, no ST/T wave changes, no ischemic changes, no STEMI   Consultations Obtained:  I requested consultation with the orthopedics, Dr. Onesimo,  and discussed lab and imaging findings as well  as pertinent plan - they recommend: Admission, OR tomorrow   Problem List / ED Course / Critical interventions / Medication management  Patient is doing well at this time and does remain stable.  Discussed with patient Lauren House does have a left-sided intertrochanteric hip fracture.  Did discuss patient case with the on-call orthopedist and will plan for admission to the hospital service.  Will plan for OR tomorrow.  CT scan of the head was unremarkable.  CTA chest demonstrated no acute traumatic findings and no vascular injury.  Blood work has been otherwise unremarkable at this point.  Lauren House had no other acute traumatic injuries noted on physical exam.  Have discussed patient case with Dr. FORBES Carwin with the hospital service who has excepted for admission. I ordered medication including IV fluids, Percocet for fall, traumatic pain Reevaluation of the patient after these medicines showed that the patient improved I have reviewed the patients home medicines and have made adjustments as needed   Social Determinants of Health:  None   Test / Admission - Considered:  Admission     Final diagnoses:  None    ED Discharge Orders     None          Daralene Lonni BIRCH, PA-C 01/05/24 1911    Franklyn Sid SAILOR, MD 01/07/24 [id number]

## 2024-01-05 NOTE — H&P (Addendum)
 History and Physical    Lauren House FMW:[id number] DOB: 05/27/1935 DOA: 01/05/2024  PCP: Duanne Butler DASEN, MD   Patient coming from: Home  I have personally briefly reviewed patient's old medical records in [facility]  Chief Complaint: Fall  HPI: Lauren House is a 88 y.o. female with medical history significant for coronary artery disease, diabetes mellitus, hypertension. Patient was brought to the ED with reports of a fall.  Patient was sitting taking a nap, she got up felt lightheaded and fell.  She has history of vertigo but not dizziness like today.  She denies prior episodes of dizziness.  She reports an episode recently, but her blood sugars were low at that time.  She reports good oral intake, no vomiting no diarrhea.  Per daughter, patient is very active at baseline.  No chest pain no difficulty breathing no cough.  ED Course: Temperature 98.7.  Heart rate 68-75.  Respiratory rate 14-31.  Blood pressure systolic 137-162.  O2 sats in the ED down to 89% on room air, patient placed on 2 L. Head CT - negative for acute abnormality. Left hip CT-contact trochanteric fracture of the left proximal femur with lateral displacement anteriorly measuring up to 7 mm. Chest x-ray showed widening mediastinum, and new rightward deviation of the trachea.  CTA chest recommended-scattered ground glass attenuation in the lungs bilaterally-edema or pneumonitis.  Other findings suggesting pulmonary artery hypertension, thyroid  nodule.  EDP talked to Ortho on-call Dr. Onesimo.  Plan for surgery tomorrow. bolus given.  Review of Systems: As per HPI all other systems reviewed and negative.  Past Medical History:  Diagnosis Date   Anxiety    Apical variant hypertrophic cardiomyopathy (HCC)    Arthritis    Borderline diabetes mellitus    Bruises easily    Bursitis of left hip    Coronary atherosclerosis of native coronary artery    a. 2011: Cath showing nonobstructive CAD - 50-60% LAD  (normal FFR) b. 06/2015: Cath showing 80% stenosis in the Mid LAD, tx w/ Promus DES 2.25 mm x 16 mm    Essential hypertension, benign    History of kidney stones    History of skin cancer    Basal cell - abdomen   Hyperlipidemia    Diet controlled.cannot take meds   Nocturia    Osteoporosis    Overactive bladder    Shortness of breath dyspnea    Vertigo     Past Surgical History:  Procedure Laterality Date   ABDOMINAL HYSTERECTOMY     CARDIAC CATHETERIZATION N/A 07/22/2015   Procedure: Left Heart Cath and Coronary Angiography;  Surgeon: Alm LELON Clay, MD;  Location: [facility] INVASIVE CV LAB;  Service: Cardiovascular;  Laterality: N/A;   CARDIAC CATHETERIZATION N/A 07/22/2015   Procedure: Coronary Stent Intervention;  Surgeon: Alm LELON Clay, MD;  Location: [facility] INVASIVE CV LAB;  Service: Cardiovascular;  Laterality: N/A;   CORONARY STENT PLACEMENT  07/22/2015   mid lad  des    EYE SURGERY     cataracts bilateral   HEMORRHOID SURGERY     INGUINAL HERNIA REPAIR  10/11/2011   Procedure: HERNIA REPAIR INGUINAL ADULT;  Surgeon: Jina Nephew, MD;  Location: [facility] OR;  Service: General;  Laterality: Right;   OPEN SURGICAL REPAIR OF GLUTEAL TENDON Left 03/12/2014   Procedure: LEFT HIP BURSECTOMY AND GLUTEAL TENDON REPAIR;  Surgeon: Dempsey Melodi GAILS, MD;  Location: [facility] ORS;  Service: Orthopedics;  Laterality: Left;     reports that she  has never smoked. She has never been exposed to tobacco smoke. She has never used smokeless tobacco. She reports that she does not drink alcohol and does not use drugs.  Allergies  Allergen Reactions   Chlorhexidine  Itching   Vancomycin  Hives   Aspirin  Other (See Comments)    Nose bleeds with full strength   Atorvastatin Other (See Comments)    Muscle pain   Atorvastatin Calcium Other (See Comments)   Bactrim [Sulfamethoxazole -Trimethoprim] Other (See Comments)    Doesn't remember    Cephalosporins Hives and Itching   Ciprofloxacin Nausea Only   Clarithromycin  Other (See Comments)    Doesn't remember   Colestipol Hcl Other (See Comments)   Ezetimibe Other (See Comments)    GI upset   Penicillamine Other (See Comments)   Prednisone     Simvastatin Other (See Comments)    Muscle spasms   Penicillins Rash    Has patient had a PCN reaction causing immediate rash, facial/tongue/throat swelling, SOB or lightheadedness with hypotension: yes Has patient had a PCN reaction causing severe rash involving mucus membranes or skin necrosis: no Has patient had a PCN reaction that required hospitalization no Has patient had a PCN reaction occurring within the last 10 years: no If all of the above answers are NO, then may proceed with Cephalosporin use.    Tape Rash and Other (See Comments)    Family History  Problem Relation Age of Onset   Heart disease Father    Lung cancer Sister    Lung cancer Brother     Prior to Admission medications   Medication Sig Start Date End Date Taking? Authorizing Provider  aspirin  EC 81 MG tablet Take 81 mg by mouth daily.    [provider]  Butalbital -APAP-Caffeine  50-300-40 MG CAPS Take 1 tablet by mouth every 6 (six) hours as needed (migraine). 08/15/20   Duanne Butler DASEN, MD  Cyanocobalamin (VITAMIN B12 PO) Take 1 tablet by mouth daily.    [provider]  fluticasone  (FLONASE ) 50 MCG/ACT nasal spray USE TWO SPRAY IN EACH NOSTRIL EVERY DAY 11/01/15   Duanne Butler DASEN, MD  methocarbamol  (ROBAXIN -750) 750 MG tablet Take 1 tablet (750 mg total) by mouth at bedtime as needed for muscle spasms. 11/27/21   Duanne Butler DASEN, MD  metoprolol  succinate (TOPROL  XL) 25 MG 24 hr tablet Take 1 tablet (25 mg total) by mouth daily. 11/07/23   Dunn, Dayna N, PA-C  Multiple Vitamins-Minerals (OCUVITE PO) Take 1 tablet by mouth daily.    [provider]  nitroGLYCERIN  (NITROSTAT ) 0.4 MG SL tablet Place 1 tablet (0.4 mg total) under the tongue every 5 (five) minutes as needed for chest pain. 08/26/14   Hilty,  Vinie BROCKS, MD  Polyethyl Glycol-Propyl Glycol (SYSTANE OP) Apply 1 drop to eye 3 (three) times daily as needed (Dry eyes).    [provider]  predniSONE  (DELTASONE ) 20 MG tablet 3 tabs poqday 1-2, 2 tabs poqday 3-4, 1 tab poqday 5-6 10/22/23   Duanne Butler DASEN, MD  ranolazine  (RANEXA ) 500 MG 12 hr tablet Take 1 tablet (500 mg total) by mouth 2 (two) times daily. 04/15/23   Hilty, Vinie BROCKS, MD  senna-docusate (SENOKOT-S) 8.6-50 MG tablet Take 1 tablet by mouth at bedtime as needed for mild constipation or moderate constipation. 01/16/18   Darra Fonda MATSU, MD    Physical Exam: Vitals:   01/05/24 1612 01/05/24 1616 01/05/24 1630  BP:  (!) 155/80 (!) 162/80  Pulse:  70 68  Resp:  14 (!) 21  Temp:  98.7 F (37.1 C)   TempSrc:  Oral   SpO2:  93% 91%  Weight: 67.1 kg    Height: 5' 7 (1.702 m)      Constitutional: NAD, calm, comfortable Vitals:   01/05/24 1612 01/05/24 1616 01/05/24 1630  BP:  (!) 155/80 (!) 162/80  Pulse:  70 68  Resp:  14 (!) 21  Temp:  98.7 F (37.1 C)   TempSrc:  Oral   SpO2:  93% 91%  Weight: 67.1 kg    Height: 5' 7 (1.702 m)     Eyes: PERRL, lids and conjunctivae normal ENMT: Mucous membranes are moist.   Neck: normal, supple, no masses, no thyromegaly Respiratory: clear to auscultation bilaterally, no wheezing, no crackles. Normal respiratory effort. No accessory muscle use.  Cardiovascular: Regular rate and rhythm, 2/6 murmurs / no rubs / gallops. No extremity edema.  Extremities warm. Abdomen: no tenderness, no masses palpated. No hepatosplenomegaly.   Musculoskeletal: no clubbing / cyanosis. No joint deformity upper and lower extremities.  Skin: no rashes, lesions, ulcers. No induration Neurologic: No facial asymmetry, moves extremities spontaneously, speech fluent.  Psychiatric: Normal judgment and insight. Alert and oriented x 3. Normal mood.   Labs on Admission: I have personally reviewed following labs and imaging studies  CBC: Recent  Labs  Lab 01/05/24 1631  WBC 9.4  NEUTROABS 5.9  HGB 13.9  HCT 43.1  MCV 87.8  PLT 224   Basic Metabolic Panel: Recent Labs  Lab 01/05/24 1631  NA 139  K 3.9  CL 103  CO2 24  GLUCOSE 111*  BUN 18  CREATININE 0.80  CALCIUM 8.8*   GFR: Estimated Creatinine Clearance: 49.1 mL/min (by C-G formula based on SCr of 0.8 mg/dL). Liver Function Tests: Recent Labs  Lab 01/05/24 1631  AST 26  ALT 15  ALKPHOS 70  BILITOT 0.4  PROT 7.1  ALBUMIN 4.2   CBG: Recent Labs  Lab 01/05/24 1612  GLUCAP 125*    Urine analysis:    Component Value Date/Time   COLORURINE DARK YELLOW 07/10/2023 1550   APPEARANCEUR CLEAR 07/10/2023 1550   LABSPEC 1.025 07/10/2023 1550   PHURINE 6.0 07/10/2023 1550   GLUCOSEU NEGATIVE 07/10/2023 1550   GLUCOSEU neg 11/12/2005 0000   HGBUR NEGATIVE 07/10/2023 1550   BILIRUBINUR NEGATIVE 09/27/2009 1044   KETONESUR NEGATIVE 07/10/2023 1550   PROTEINUR 1+ (A) 07/10/2023 1550   UROBILINOGEN 0.2 09/27/2009 1044   NITRITE NEGATIVE 07/10/2023 1550   LEUKOCYTESUR NEGATIVE 07/10/2023 1550    Radiological Exams on Admission: CT Angio Chest Aorta W and/or Wo Contrast Result Date: 01/05/2024 CLINICAL DATA:  Fall, abnormal chest x-ray. EXAM: CT ANGIOGRAPHY CHEST WITH CONTRAST TECHNIQUE: Multidetector CT imaging of the chest was performed using the standard protocol during bolus administration of intravenous contrast. Multiplanar CT image reconstructions and MIPs were obtained to evaluate the vascular anatomy. RADIATION DOSE REDUCTION: This exam was performed according to the departmental dose-optimization program which includes automated exposure control, adjustment of the mA and/or kV according to patient size and/or use of iterative reconstruction technique. CONTRAST:  75mL OMNIPAQUE  IOHEXOL  350 MG/ML SOLN COMPARISON:  01/05/2024, 12/23/2012, 08/24/2011. FINDINGS: Cardiovascular: The heart is enlarged and there is no pericardial effusion. Scattered coronary  artery calcifications are present. There is atherosclerotic calcification the aorta a without evidence of aneurysm. The pulmonary trunk is distended suggesting underlying pulmonary artery hypertension. No evidence of pulmonary embolism is seen. Mediastinum/Nodes: No mediastinal, hilar, or axillary lymphadenopathy is  seen. There is a complex nodule in the right lobe of the thyroid  gland measuring 1.5 cm. The trachea and esophagus are within normal limits. Lungs/Pleura: Scattered ground-glass attenuation is noted in the lungs bilaterally. No effusion or pneumothorax. There is a 3 mm nodule in the right upper lobe, axial image 44, unchanged from 2014 and likely benign. No new nodule or mass is seen. Upper Abdomen: Stones are present within the gallbladder. A stable which shaped hypodensity is noted in the anterior right lobe of the liver, unchanged from 2013. Musculoskeletal: Degenerative changes are present in the thoracic spine. No acute osseous abnormality. Review of the MIP images confirms the above findings. IMPRESSION: 1. No evidence of pulmonary embolism. 2. Scattered ground-glass attenuation in the lungs bilaterally, possible edema or pneumonitis. 3. Distended pulmonary trunk suggesting underlying pulmonary artery hypertension. 4. Indeterminate right thyroid  nodule measuring 1.5 cm. Nonemergent ultrasound is recommended for further characterization. 5. Cholelithiasis. 6. Aortic atherosclerosis and coronary artery calcifications Electronically Signed   By: Leita Birmingham M.D.   On: 01/05/2024 18:09   CT Hip Left Wo Contrast Result Date: 01/05/2024 EXAM: CT OF THE LEFT HIP WITHOUT IV CONTRAST 01/05/2024 05:36:48 PM TECHNIQUE: CT of the left hip was performed without the administration of intravenous contrast. Multiplanar reformatted images are provided for review. Automated exposure control, iterative reconstruction, and/or weight based adjustment of the mA/kV was utilized to reduce the radiation dose to as low  as reasonably achievable. COMPARISON: Hip radiographs dated 01/05/2024. CLINICAL HISTORY: Hip trauma, fracture suspected, xray done. FINDINGS: BONES/JOINT:: Intertrochanteric fracture of the left proximal femur with fracture margins extending through the greater and lesser trochanters. There is lateral displacement anteriorly measuring up to 7 mm. The superior fracture margin extends to the level of the suture anchors within the greater trochanter. No dislocation. Mild osteoarthritis of the left hip. SOFT TISSUE: Soft tissue and intramuscular edema surrounding the left femoral intertrochanteric fracture. Small fat-containing left inguinal hernia. Arterial vascular calcifications. IMPRESSION: 1. Intertrochanteric fracture of the left proximal femur with lateral displacement anteriorly measuring up to 7 mm. The superior fracture margin extends to the level of the suture anchors within the greater trochanter. 2. Mild osteoarthritis of the left hip. Electronically signed by: Harrietta Sherry MD 01/05/2024 05:46 PM EST RP Workstation: [id number]   DG Chest Port 1 View Result Date: 01/05/2024 EXAM: 1 VIEW(S) XRAY OF THE CHEST 01/05/2024 04:54:00 PM COMPARISON: 07/17/2023 CLINICAL HISTORY: fall, diffuse pain FINDINGS: LUNGS AND PLEURA: Elevated right hemidiaphragm. No focal pulmonary opacity. No pulmonary edema. No pleural effusion. No pneumothorax. HEART AND MEDIASTINUM: Aortic atherosclerosis. There is new widening of the mediastinum compared to 07/17/2023 and new rightward deviation of the trachea. CTA of the chest is recommended to evaluate for vascular injury or adenopathy. No acute abnormality of the cardiac silhouette. BONES AND SOFT TISSUES: No acute osseous abnormality. IMPRESSION: 1. New widening of the mediastinum and new rightward deviation of the trachea compared to 07/17/2023. CTA of the chest is recommended to evaluate for vascular injury or adenopathy. 2. Elevated right hemidiaphragm. Electronically  signed by: Norman Gatlin MD 01/05/2024 05:03 PM EST RP Workstation: [id number]   DG Hip Unilat W or Wo Pelvis 2-3 Views Left Result Date: 01/05/2024 CLINICAL DATA:  Fall, diffuse pain.  Left hip pain. EXAM: DG HIP (WITH OR WITHOUT PELVIS) 2-3V LEFT COMPARISON:  01/16/2018. FINDINGS: A lucency is noted in the inter trochanteric region on the left, suspicious for nondisplaced fracture. Suture anchors are present at the greater trochanter. No dislocation is seen.  Mild degenerative changes are noted at the hips. There are degenerative changes at the sacroiliac joints and lower lumbar spine. Vascular calcifications are present in the bilateral lower extremities. IMPRESSION: 1. Lucency in the intratrochanteric region on the left suggesting nondisplaced fracture. 2. Surgical changes at the greater trochanter on the left. Electronically Signed   By: Leita Birmingham M.D.   On: 01/05/2024 17:01   CT Head Wo Contrast Result Date: 01/05/2024 EXAM: CT HEAD WITHOUT CONTRAST 01/05/2024 04:40:10 PM TECHNIQUE: CT of the head was performed without the administration of intravenous contrast. Automated exposure control, iterative reconstruction, and/or weight based adjustment of the mA/kV was utilized to reduce the radiation dose to as low as reasonably achievable. COMPARISON: MRI brain 09/05/2020. CLINICAL HISTORY: near syncope, fall FINDINGS: BRAIN AND VENTRICLES: No acute hemorrhage. No evidence of acute infarct. Patchy periventricular and deep white matter low-attenuation, likely chronic microvascular ischemic changes. Prominent ventricles and sulci reflecting age-related volume loss. No extra-axial collection. No mass effect or midline shift. Intracranial vascular calcifications. ORBITS: Bilateral lens replacements noted. SINUSES: No acute abnormality. SOFT TISSUES AND SKULL: No acute soft tissue abnormality. No skull fracture. IMPRESSION: 1. No acute intracranial abnormality. 2. Moderate chronic microvascular ischemia. 3.  Age-related cerebral volume loss. Electronically signed by: Donnice Mania MD 01/05/2024 04:53 PM EST RP Workstation: [id number]   EKG: Independently reviewed.  Sinus rhythm, rate 70, QTc 416.  LVH.  No significant change from prior.  Assessment/Plan Principal Problem:   Closed left hip fracture (HCC) Active Problems:   Acute hypoxic respiratory failure (HCC)   Thyroid  nodule   Essential hypertension   Diabetes mellitus (HCC)  Assessment and Plan:  Closed left hip fracture-status post fall.  CT left hip - intertrochanteric fracture of the left proximal femur with lateral displacement anteriorly measuring up to 7 mm. The superior fracture margin extends to the level of the suture anchors within the greater trochanter. - EDP talked with Dr. Evy for surgery tomorrow - N.p.o. midnight - IV morphine  2 mg every 4 hours as needed  Acute hypoxic respiratory failure-O2 sats 89% on room air.  She denies dyspnea, or cough.  No peripheral signs of edema.  CTA chest-negative for PE, suggest possible edema or pneumonitis, also pulmonary artery hypertension.  Not on diuretics.  Last echo 03/2022 EF of 60 to 65%, grade 1 DD. - proBNP elevated at 1064 no prior to compare - Check procalcitonin - 500 mL bolus given in ED, will give IV Lasix 40 mg x 1, further dosing pending clinical course - Echocardiogram  Fall/dizziness-sustaining hip fracture.  Orthostatic dizziness prior to fall.  Denies prior episodes of dizziness.  Possible hypoxia.  Head CT negative for acute abnormality.  Thyroid  nodule - incidental finding on CTA chest today - indeterminate right thyroid  nodule measuring 1.5 cm.  - - - - - - Nonemergent ultrasound is recommended for further characterization.  Hypertension - Stable. - Resume metoprolol  25 mg daily  Diabetes mellitus-diet controlled.   DVT prophylaxis: SCDS Code Status: FULL Family Communication: Daughter at bedside Disposition Plan: ~ 2 days Consults called:  Ortho Admission status: Inpt tele  I certify that at the point of admission it is my clinical judgment that the patient will require inpatient hospital care spanning beyond 2 midnights from the point of admission due to high intensity of service, high risk for further deterioration and high frequency of surveillance required.   Author: Tully FORBES Carwin, MD 01/05/2024 9:37 PM  For on call review [email or web address].

## 2024-01-06 ENCOUNTER — Inpatient Hospital Stay (HOSPITAL_COMMUNITY): Admitting: Certified Registered Nurse Anesthetist

## 2024-01-06 ENCOUNTER — Encounter (HOSPITAL_COMMUNITY)
Admission: EM | Disposition: A | Discharge status: Skilled Nursing Facility | Payer: Self-pay | Source: Home / Self Care | Attending: Family Medicine

## 2024-01-06 ENCOUNTER — Encounter (HOSPITAL_COMMUNITY): Payer: Self-pay | Admitting: Internal Medicine

## 2024-01-06 ENCOUNTER — Inpatient Hospital Stay (HOSPITAL_COMMUNITY)

## 2024-01-06 ENCOUNTER — Ambulatory Visit

## 2024-01-06 ENCOUNTER — Other Ambulatory Visit (HOSPITAL_COMMUNITY): Payer: Self-pay | Admitting: *Deleted

## 2024-01-06 DIAGNOSIS — F419 Anxiety disorder, unspecified: Secondary | ICD-10-CM

## 2024-01-06 DIAGNOSIS — I25119 Atherosclerotic heart disease of native coronary artery with unspecified angina pectoris: Secondary | ICD-10-CM

## 2024-01-06 DIAGNOSIS — S72002A Fracture of unspecified part of neck of left femur, initial encounter for closed fracture: Secondary | ICD-10-CM | POA: Diagnosis not present

## 2024-01-06 DIAGNOSIS — S72142A Displaced intertrochanteric fracture of left femur, initial encounter for closed fracture: Secondary | ICD-10-CM

## 2024-01-06 DIAGNOSIS — I10 Essential (primary) hypertension: Secondary | ICD-10-CM

## 2024-01-06 DIAGNOSIS — J9601 Acute respiratory failure with hypoxia: Secondary | ICD-10-CM

## 2024-01-06 HISTORY — PX: INTRAMEDULLARY (IM) NAIL INTERTROCHANTERIC: SHX5875

## 2024-01-06 LAB — BASIC METABOLIC PANEL WITH GFR
Anion gap: 9 (ref 5–15)
BUN: 15 mg/dL (ref 8–23)
CO2: 27 mmol/L (ref 22–32)
Calcium: 8.7 mg/dL — ABNORMAL LOW (ref 8.9–10.3)
Chloride: 100 mmol/L (ref 98–111)
Creatinine, Ser: 0.76 mg/dL (ref 0.44–1.00)
GFR, Estimated: >60 mL/min (ref >60)
Glucose, Bld: 118 mg/dL — ABNORMAL HIGH (ref 70–99)
Potassium: 4.1 mmol/L (ref 3.5–5.1)
Sodium: 136 mmol/L (ref 135–145)

## 2024-01-06 LAB — CBC
HCT: 39.6 % (ref 36.0–46.0)
Hemoglobin: 12.8 g/dL (ref 12.0–15.0)
MCH: 28.5 pg (ref 26.0–34.0)
MCHC: 32.3 g/dL (ref 30.0–36.0)
MCV: 88.2 fL (ref 80.0–100.0)
Platelets: 191 K/uL (ref 150–400)
RBC: 4.49 MIL/uL (ref 3.87–5.11)
RDW: 13.2 % (ref 11.5–15.5)
WBC: 11.6 K/uL — ABNORMAL HIGH (ref 4.0–10.5)
nRBC: 0 % (ref 0.0–0.2)

## 2024-01-06 LAB — ECHOCARDIOGRAM COMPLETE
Area-P 1/2: 3.53 cm2
Height: 67 in
MV M vel: 4.09 m/s
MV Peak grad: 66.9 mmHg
MV VTI: 0.96 cm2
S' Lateral: 3 cm
Weight: 2368 [oz_av]

## 2024-01-06 LAB — GLUCOSE, CAPILLARY: Glucose-Capillary: 101 mg/dL — ABNORMAL HIGH (ref 70–99)

## 2024-01-06 SURGERY — FIXATION, FRACTURE, INTERTROCHANTERIC, WITH INTRAMEDULLARY ROD
Anesthesia: General | Site: Hip | Laterality: Left

## 2024-01-06 MED ORDER — LACTATED RINGERS IV SOLN: INTRAVENOUS | Status: DC

## 2024-01-06 MED ORDER — OXYCODONE HCL 5 MG PO TABS: 5.0000 mg | ORAL_TABLET | Freq: Once | ORAL | Status: DC | PRN

## 2024-01-06 MED ORDER — PROPOFOL 10 MG/ML IV BOLUS
INTRAVENOUS | Status: DC | PRN
  Administered 2024-01-06: 80 mg via INTRAVENOUS

## 2024-01-06 MED ORDER — CLINDAMYCIN PHOSPHATE 900 MG/50ML IV SOLN
900.0000 mg | INTRAVENOUS | Status: AC
  Administered 2024-01-06: 900 mg via INTRAVENOUS
  Filled 2024-01-06: qty 50

## 2024-01-06 MED ORDER — FENTANYL CITRATE (PF) 100 MCG/2ML IJ SOLN
INTRAMUSCULAR | Status: AC
  Filled 2024-01-06: qty 2

## 2024-01-06 MED ORDER — SODIUM CHLORIDE 0.9 % IV SOLN: INTRAVENOUS | Status: DC

## 2024-01-06 MED ORDER — FENTANYL CITRATE (PF) 100 MCG/2ML IJ SOLN
INTRAMUSCULAR | Status: DC | PRN
  Administered 2024-01-06 (×2): 50 ug via INTRAVENOUS

## 2024-01-06 MED ORDER — CEFAZOLIN SODIUM-DEXTROSE 2-4 GM/100ML-% IV SOLN: 2.0000 g | INTRAVENOUS | Status: DC

## 2024-01-06 MED ORDER — PHENYLEPHRINE HCL-NACL 20-0.9 MG/250ML-% IV SOLN
INTRAVENOUS | Status: DC | PRN
  Administered 2024-01-06: 40 ug/min via INTRAVENOUS

## 2024-01-06 MED ORDER — CLINDAMYCIN PHOSPHATE 900 MG/50ML IV SOLN: 900.0000 mg | INTRAVENOUS | Status: DC

## 2024-01-06 MED ORDER — ONDANSETRON HCL 4 MG/2ML IJ SOLN: 4.0000 mg | Freq: Once | INTRAMUSCULAR | Status: DC | PRN

## 2024-01-06 MED ORDER — SODIUM CHLORIDE 0.9 % IR SOLN
Status: DC | PRN
  Administered 2024-01-06: 1000 mL

## 2024-01-06 MED ORDER — PROPOFOL 500 MG/50ML IV EMUL
INTRAVENOUS | Status: DC | PRN
  Administered 2024-01-06: 75 ug/kg/min via INTRAVENOUS

## 2024-01-06 MED ORDER — BUPIVACAINE HCL (PF) 0.5 % IJ SOLN
INTRAMUSCULAR | Status: DC | PRN
  Administered 2024-01-06: 30 mL

## 2024-01-06 MED ORDER — TRANEXAMIC ACID-NACL 1000-0.7 MG/100ML-% IV SOLN
1000.0000 mg | INTRAVENOUS | Status: AC
  Administered 2024-01-06: 1000 mg via INTRAVENOUS
  Filled 2024-01-06: qty 100

## 2024-01-06 MED ORDER — ORAL CARE MOUTH RINSE: 15.0000 mL | OROMUCOSAL | Status: DC | PRN

## 2024-01-06 MED ORDER — DEXMEDETOMIDINE HCL IN NACL 80 MCG/20ML IV SOLN
INTRAVENOUS | Status: DC | PRN
  Administered 2024-01-06: 8 ug via INTRAVENOUS

## 2024-01-06 MED ORDER — FENTANYL CITRATE (PF) 50 MCG/ML IJ SOSY: 25.0000 ug | PREFILLED_SYRINGE | INTRAMUSCULAR | Status: DC | PRN

## 2024-01-06 MED ORDER — BUPIVACAINE-EPINEPHRINE (PF) 0.5% -1:200000 IJ SOLN
INTRAMUSCULAR | Status: AC
  Filled 2024-01-06: qty 30

## 2024-01-06 MED ORDER — OXYCODONE HCL 5 MG/5ML PO SOLN: 5.0000 mg | Freq: Once | ORAL | Status: DC | PRN

## 2024-01-06 SURGICAL SUPPLY — 46 items
BIT DRILL 4.0X280 (BIT) IMPLANT
BLADE SURG SZ10 CARB STEEL (BLADE) ×1 IMPLANT
COUNTER NDL MAGNETIC 40 RED (SET/KITS/TRAYS/PACK) ×1 IMPLANT
COUNTER NEEDLE MAGNETIC 40 RED (SET/KITS/TRAYS/PACK) ×1 IMPLANT
COVER LIGHT HANDLE STERIS (MISCELLANEOUS) ×2 IMPLANT
COVER MAYO STAND XLG (MISCELLANEOUS) ×1 IMPLANT
COVER PERINEAL POST (MISCELLANEOUS) ×1 IMPLANT
DRAPE STERI IOBAN 125X83 (DRAPES) ×1 IMPLANT
DRSG MEPILEX SACRM 8.7X9.8 (GAUZE/BANDAGES/DRESSINGS) ×1 IMPLANT
DRSG TEGADERM 4X4.75 (GAUZE/BANDAGES/DRESSINGS) ×4 IMPLANT
ELECTRODE REM PT RTRN 9FT ADLT (ELECTROSURGICAL) ×1 IMPLANT
GAUZE SPONGE 4X4 12PLY STRL (GAUZE/BANDAGES/DRESSINGS) ×1 IMPLANT
GLOVE BIO SURGEON STRL SZ7 (GLOVE) IMPLANT
GLOVE BIO SURGEON STRL SZ8.5 (GLOVE) ×2 IMPLANT
GLOVE BIOGEL PI IND STRL 7.0 (GLOVE) ×2 IMPLANT
GLOVE BIOGEL PI IND STRL 8.5 (GLOVE) ×1 IMPLANT
GOWN STRL REUS W/TWL LRG LVL3 (GOWN DISPOSABLE) ×1 IMPLANT
GOWN STRL REUS W/TWL XL LVL3 (GOWN DISPOSABLE) ×1 IMPLANT
GUIDEWIRE ORTH 900X3XBALL NOSE (WIRE) IMPLANT
KIT TURNOVER KIT A (KITS) ×1 IMPLANT
MANIFOLD NEPTUNE II (INSTRUMENTS) ×1 IMPLANT
MARKER SKIN DUAL TIP RULER LAB (MISCELLANEOUS) ×1 IMPLANT
MAT GRAY ABSORB FLUID 28X50 (MISCELLANEOUS) ×1 IMPLANT
NAIL LT 10X36X125 TROCHANTERIC (Nail) IMPLANT
NDL HYPO 21X1.5 SAFETY (NEEDLE) ×1 IMPLANT
NEEDLE HYPO 21X1.5 SAFETY (NEEDLE) ×1 IMPLANT
PACK SRG BSC III STRL LF ECLPS (CUSTOM PROCEDURE TRAY) ×1 IMPLANT
PAD ARMBOARD POSITIONER FOAM (MISCELLANEOUS) ×1 IMPLANT
PENCIL SMOKE EVACUATOR COATED (MISCELLANEOUS) ×1 IMPLANT
PIN GUIDE THRD AR 3.2X330 (PIN) IMPLANT
POSITIONER HEAD 8X9X4 ADT (SOFTGOODS) ×1 IMPLANT
SCREW CORT CAPT FT 5.0X36 (Screw) IMPLANT
SCREW TELESCOPING LAG 10.5X90 (Screw) IMPLANT
SOLN 0.9% NACL POUR BTL 1000ML (IV SOLUTION) ×1 IMPLANT
SPONGE T-LAP 18X18 ~~LOC~~+RFID (SPONGE) ×2 IMPLANT
STRIP CLOSURE SKIN 1/2X4 (GAUZE/BANDAGES/DRESSINGS) IMPLANT
SUT MNCRL AB 4-0 PS2 18 (SUTURE) IMPLANT
SUT MON AB 2-0 CT1 36 (SUTURE) ×1 IMPLANT
SUT VIC AB 0 CT1 27XBRD ANTBC (SUTURE) ×1 IMPLANT
SYR 20ML LL LF (SYRINGE) IMPLANT
SYR 30ML LL (SYRINGE) ×1 IMPLANT
SYR BULB IRRIG 60ML STRL (SYRINGE) ×2 IMPLANT
TOOL ACTIVATION TELES LAG SCRW (INSTRUMENTS) IMPLANT
TOOL TELESCOPE ALIGN LAG SCRW (INSTRUMENTS) ×1 IMPLANT
TOWEL OR 17X26 4PK STRL BLUE (TOWEL DISPOSABLE) ×1 IMPLANT
YANKAUER SUCT BULB TIP 10FT TU (MISCELLANEOUS) ×1 IMPLANT

## 2024-01-06 NOTE — Anesthesia Procedure Notes (Signed)
 Procedure Name: LMA Insertion Date/Time: 01/06/2024 4:03 PM  Performed by: Cordella Elvie HERO, CRNAPre-anesthesia Checklist: Patient identified, Emergency Drugs available, Suction available, Patient being monitored and Timeout performed Patient Re-evaluated:Patient Re-evaluated prior to induction Oxygen Delivery Method: Circle system utilized Preoxygenation: Pre-oxygenation with 100% oxygen Induction Type: IV induction LMA: LMA inserted LMA Size: 4.0 Number of attempts: 1 Placement Confirmation: positive ETCO2, CO2 detector and breath sounds checked- equal and bilateral Tube secured with: Tape Dental Injury: Teeth and Oropharynx as per pre-operative assessment

## 2024-01-06 NOTE — Consult Note (Signed)
 ORTHOPAEDIC CONSULTATION  REQUESTING PHYSICIAN: Bryn Bernardino NOVAK, MD  ASSESSMENT AND PLAN: 88 y.o. female with the following: Left Hip Intertrochanteric femur fracture  This patient requires inpatient admission to the hospitalist, to include preoperative clearance and perioperative medical management  - Weight Bearing Status/Activity: NWB Left lower extremity  - Additional recommended labs/tests: Preop Labs: CBC, BMP, PT/INR, Chest XR, and EKG  -VTE Prophylaxis: Please hold prior to OR; to resume POD#1 at the discretion of the primary team  - Pain control: Recommend PO pain medications PRN; judicious use of narcotics  - Follow-up plan: F/u 10-14 days postop  -Procedures: Plan for OR once patient has been medically optimized  Plan for Left Hip Cephalomedullary nail     Chief Complaint: Left hip pain  HPI: Lauren House is a 88 y.o. female who presented to the ED for evaluation after sustaining a mechanical fall.  She states that she fell asleep, and upon waking, she stood up and felt dizzy.  She fell to the ground.  She had immediate pain in the left hip.  She was unable to bear weight.  She was evaluated in the emergency department, noted to have a left hip fracture.  Of note, she had a left gluteal tendon repair, completed approximately 10 years ago by Dr. Hiram.  She has done well following the surgery.  She has pain currently in the left hip only.  She did not lose consciousness.  Pain gets worse if she moves her left leg.  She had an injection for left hip bursitis just a few days ago.  This was completed by provider in [place].    Past Medical History:  Diagnosis Date   Anxiety    Apical variant hypertrophic cardiomyopathy (HCC)    Arthritis    Borderline diabetes mellitus    Bruises easily    Bursitis of left hip    Coronary atherosclerosis of native coronary artery    a. 2011: Cath showing nonobstructive CAD - 50-60% LAD (normal FFR) b. 06/2015: Cath showing 80%  stenosis in the Mid LAD, tx w/ Promus DES 2.25 mm x 16 mm    Essential hypertension, benign    History of kidney stones    History of skin cancer    Basal cell - abdomen   Hyperlipidemia    Diet controlled.cannot take meds   Nocturia    Osteoporosis    Overactive bladder    Shortness of breath dyspnea    Vertigo    Past Surgical History:  Procedure Laterality Date   ABDOMINAL HYSTERECTOMY     CARDIAC CATHETERIZATION N/A 07/22/2015   Procedure: Left Heart Cath and Coronary Angiography;  Surgeon: Alm LELON Clay, MD;  Location[facility] INVASIVE CV LAB;  Service: Cardiovascular;  Laterality: N/A;   CARDIAC CATHETERIZATION N/A 07/22/2015   Procedure: Coronary Stent Intervention;  Surgeon: Alm LELON Clay, MD;  Location: [facility] INVASIVE CV LAB;  Service: Cardiovascular;  Laterality: N/A;   CORONARY STENT PLACEMENT  07/22/2015   mid lad  des    EYE SURGERY     cataracts bilateral   HEMORRHOID SURGERY     INGUINAL HERNIA REPAIR  10/11/2011   Procedure: HERNIA REPAIR INGUINAL ADULT;  Surgeon: Jina Nephew, MD;  Location: [facility] OR;  Service: General;  Laterality: Right;   OPEN SURGICAL REPAIR OF GLUTEAL TENDON Left 03/12/2014   Procedure: LEFT HIP BURSECTOMY AND GLUTEAL TENDON REPAIR;  Surgeon: Dempsey Melodi GAILS, MD;  Location: [facility] ORS;  Service: Orthopedics;  Laterality: Left;  Social History   Socioeconomic History   Marital status: Widowed    Spouse name: Not on file   Number of children: 4   Years of education: 60   Highest education level: 12th grade  Occupational History   Not on file  Tobacco Use   Smoking status: Never    Passive exposure: Never   Smokeless tobacco: Never  Vaping Use   Vaping status: Never Used  Substance and Sexual Activity   Alcohol use: No   Drug use: No   Sexual activity: Not Currently  Other Topics Concern   Not on file  Social History Narrative   3 sons, 1 daughter. 1 son died in 20-Jan-2017 (unexpectedly)   Husband died in January 21, 2015.   6 grandchildren    10 great  grandchildren   Social Drivers of Corporate Investment Banker Strain: Low Risk  (06/06/2023)   Overall Financial Resource Strain (CARDIA)    Difficulty of Paying Living Expenses: Not hard at all  Food Insecurity: No Food Insecurity (01/05/2024)   Hunger Vital Sign    Worried About Running Out of Food in the Last Year: Never true    Ran Out of Food in the Last Year: Never true  Transportation Needs: No Transportation Needs (01/05/2024)   PRAPARE - Administrator, Civil Service (Medical): No    Lack of Transportation (Non-Medical): No  Physical Activity: Insufficiently Active (06/06/2023)   Exercise Vital Sign    Days of Exercise per Week: 3 days    Minutes of Exercise per Session: 30 min  Stress: No Stress Concern Present (06/06/2023)   [employer] of Occupational Health - Occupational Stress Questionnaire    Feeling of Stress : Not at all  Social Connections: Moderately Isolated (01/05/2024)   Social Connection and Isolation Panel    Frequency of Communication with Friends and Family: Twice a week    Frequency of Social Gatherings with Friends and Family: Once a week    Attends Religious Services: More than 4 times per year    Active Member of [employer] or Organizations: No    Attends Banker Meetings: Never    Marital Status: Widowed   Family History  Problem Relation Age of Onset   Heart disease Father    Lung cancer Sister    Lung cancer Brother    Allergies  Allergen Reactions   Chlorhexidine  Itching   Vancomycin  Hives   Aspirin  Other (See Comments)    Nose bleeds with full strength   Atorvastatin Other (See Comments)    Muscle pain   Atorvastatin Calcium Other (See Comments)   Bactrim [Sulfamethoxazole -Trimethoprim] Other (See Comments)    Doesn't remember    Cephalosporins Hives and Itching   Ciprofloxacin Nausea Only   Clarithromycin Other (See Comments)    Doesn't remember   Colestipol Hcl Other (See Comments)   Ezetimibe Other (See  Comments)    GI upset   Penicillamine Other (See Comments)   Prednisone     Simvastatin Other (See Comments)    Muscle spasms   Penicillins Rash    Has patient had a PCN reaction causing immediate rash, facial/tongue/throat swelling, SOB or lightheadedness with hypotension: yes Has patient had a PCN reaction causing severe rash involving mucus membranes or skin necrosis: no Has patient had a PCN reaction that required hospitalization no Has patient had a PCN reaction occurring within the last 10 years: no If all of the above answers are NO, then may proceed with Cephalosporin  use.    Tape Rash and Other (See Comments)   Prior to Admission medications   Medication Sig Start Date End Date Taking? Authorizing Provider  aspirin  EC 81 MG tablet Take 81 mg by mouth daily.    [provider]  Butalbital -APAP-Caffeine  50-300-40 MG CAPS Take 1 tablet by mouth every 6 (six) hours as needed (migraine). 08/15/20   Duanne Butler DASEN, MD  Cyanocobalamin (VITAMIN B12 PO) Take 1 tablet by mouth daily.    [provider]  fluticasone  (FLONASE ) 50 MCG/ACT nasal spray USE TWO SPRAY IN EACH NOSTRIL EVERY DAY 11/01/15   Duanne Butler DASEN, MD  methocarbamol  (ROBAXIN -750) 750 MG tablet Take 1 tablet (750 mg total) by mouth at bedtime as needed for muscle spasms. 11/27/21   Duanne Butler DASEN, MD  metoprolol  succinate (TOPROL  XL) 25 MG 24 hr tablet Take 1 tablet (25 mg total) by mouth daily. 11/07/23   Dunn, Dayna N, PA-C  Multiple Vitamins-Minerals (OCUVITE PO) Take 1 tablet by mouth daily.    [provider]  nitroGLYCERIN  (NITROSTAT ) 0.4 MG SL tablet Place 1 tablet (0.4 mg total) under the tongue every 5 (five) minutes as needed for chest pain. 08/26/14   Hilty, Vinie BROCKS, MD  Polyethyl Glycol-Propyl Glycol (SYSTANE OP) Apply 1 drop to eye 3 (three) times daily as needed (Dry eyes).    [provider]  predniSONE  (DELTASONE ) 20 MG tablet 3 tabs poqday 1-2, 2 tabs poqday 3-4, 1 tab  poqday 5-6 10/22/23   Duanne Butler DASEN, MD  ranolazine  (RANEXA ) 500 MG 12 hr tablet Take 1 tablet (500 mg total) by mouth 2 (two) times daily. 04/15/23   Hilty, Vinie BROCKS, MD  senna-docusate (SENOKOT-S) 8.6-50 MG tablet Take 1 tablet by mouth at bedtime as needed for mild constipation or moderate constipation. 01/16/18   LongFonda MATSU, MD   CT Angio Chest Aorta W and/or Wo Contrast Result Date: 01/05/2024 CLINICAL DATA:  Fall, abnormal chest x-ray. EXAM: CT ANGIOGRAPHY CHEST WITH CONTRAST TECHNIQUE: Multidetector CT imaging of the chest was performed using the standard protocol during bolus administration of intravenous contrast. Multiplanar CT image reconstructions and MIPs were obtained to evaluate the vascular anatomy. RADIATION DOSE REDUCTION: This exam was performed according to the departmental dose-optimization program which includes automated exposure control, adjustment of the mA and/or kV according to patient size and/or use of iterative reconstruction technique. CONTRAST:  75mL OMNIPAQUE  IOHEXOL  350 MG/ML SOLN COMPARISON:  01/05/2024, 12/23/2012, 08/24/2011. FINDINGS: Cardiovascular: The heart is enlarged and there is no pericardial effusion. Scattered coronary artery calcifications are present. There is atherosclerotic calcification the aorta a without evidence of aneurysm. The pulmonary trunk is distended suggesting underlying pulmonary artery hypertension. No evidence of pulmonary embolism is seen. Mediastinum/Nodes: No mediastinal, hilar, or axillary lymphadenopathy is seen. There is a complex nodule in the right lobe of the thyroid  gland measuring 1.5 cm. The trachea and esophagus are within normal limits. Lungs/Pleura: Scattered ground-glass attenuation is noted in the lungs bilaterally. No effusion or pneumothorax. There is a 3 mm nodule in the right upper lobe, axial image 44, unchanged from 2014 and likely benign. No new nodule or mass is seen. Upper Abdomen: Stones are present within the  gallbladder. A stable which shaped hypodensity is noted in the anterior right lobe of the liver, unchanged from 2013. Musculoskeletal: Degenerative changes are present in the thoracic spine. No acute osseous abnormality. Review of the MIP images confirms the above findings. IMPRESSION: 1. No evidence of pulmonary embolism. 2. Scattered  ground-glass attenuation in the lungs bilaterally, possible edema or pneumonitis. 3. Distended pulmonary trunk suggesting underlying pulmonary artery hypertension. 4. Indeterminate right thyroid  nodule measuring 1.5 cm. Nonemergent ultrasound is recommended for further characterization. 5. Cholelithiasis. 6. Aortic atherosclerosis and coronary artery calcifications Electronically Signed   By: Leita Birmingham M.D.   On: 01/05/2024 18:09   CT Hip Left Wo Contrast Result Date: 01/05/2024 EXAM: CT OF THE LEFT HIP WITHOUT IV CONTRAST 01/05/2024 05:36:48 PM TECHNIQUE: CT of the left hip was performed without the administration of intravenous contrast. Multiplanar reformatted images are provided for review. Automated exposure control, iterative reconstruction, and/or weight based adjustment of the mA/kV was utilized to reduce the radiation dose to as low as reasonably achievable. COMPARISON: Hip radiographs dated 01/05/2024. CLINICAL HISTORY: Hip trauma, fracture suspected, xray done. FINDINGS: BONES/JOINT:: Intertrochanteric fracture of the left proximal femur with fracture margins extending through the greater and lesser trochanters. There is lateral displacement anteriorly measuring up to 7 mm. The superior fracture margin extends to the level of the suture anchors within the greater trochanter. No dislocation. Mild osteoarthritis of the left hip. SOFT TISSUE: Soft tissue and intramuscular edema surrounding the left femoral intertrochanteric fracture. Small fat-containing left inguinal hernia. Arterial vascular calcifications. IMPRESSION: 1. Intertrochanteric fracture of the left  proximal femur with lateral displacement anteriorly measuring up to 7 mm. The superior fracture margin extends to the level of the suture anchors within the greater trochanter. 2. Mild osteoarthritis of the left hip. Electronically signed by: Harrietta Sherry MD 01/05/2024 05:46 PM EST RP Workstation: [id number]   DG Chest Port 1 View Result Date: 01/05/2024 EXAM: 1 VIEW(S) XRAY OF THE CHEST 01/05/2024 04:54:00 PM COMPARISON: 07/17/2023 CLINICAL HISTORY: fall, diffuse pain FINDINGS: LUNGS AND PLEURA: Elevated right hemidiaphragm. No focal pulmonary opacity. No pulmonary edema. No pleural effusion. No pneumothorax. HEART AND MEDIASTINUM: Aortic atherosclerosis. There is new widening of the mediastinum compared to 07/17/2023 and new rightward deviation of the trachea. CTA of the chest is recommended to evaluate for vascular injury or adenopathy. No acute abnormality of the cardiac silhouette. BONES AND SOFT TISSUES: No acute osseous abnormality. IMPRESSION: 1. New widening of the mediastinum and new rightward deviation of the trachea compared to 07/17/2023. CTA of the chest is recommended to evaluate for vascular injury or adenopathy. 2. Elevated right hemidiaphragm. Electronically signed by: Norman Gatlin MD 01/05/2024 05:03 PM EST RP Workstation: [id number]   DG Hip Unilat W or Wo Pelvis 2-3 Views Left Result Date: 01/05/2024 CLINICAL DATA:  Fall, diffuse pain.  Left hip pain. EXAM: DG HIP (WITH OR WITHOUT PELVIS) 2-3V LEFT COMPARISON:  01/16/2018. FINDINGS: A lucency is noted in the inter trochanteric region on the left, suspicious for nondisplaced fracture. Suture anchors are present at the greater trochanter. No dislocation is seen. Mild degenerative changes are noted at the hips. There are degenerative changes at the sacroiliac joints and lower lumbar spine. Vascular calcifications are present in the bilateral lower extremities. IMPRESSION: 1. Lucency in the intratrochanteric region on the left suggesting  nondisplaced fracture. 2. Surgical changes at the greater trochanter on the left. Electronically Signed   By: Leita Birmingham M.D.   On: 01/05/2024 17:01   CT Head Wo Contrast Result Date: 01/05/2024 EXAM: CT HEAD WITHOUT CONTRAST 01/05/2024 04:40:10 PM TECHNIQUE: CT of the head was performed without the administration of intravenous contrast. Automated exposure control, iterative reconstruction, and/or weight based adjustment of the mA/kV was utilized to reduce the radiation dose to as low as reasonably achievable. COMPARISON: MRI  brain 09/05/2020. CLINICAL HISTORY: near syncope, fall FINDINGS: BRAIN AND VENTRICLES: No acute hemorrhage. No evidence of acute infarct. Patchy periventricular and deep white matter low-attenuation, likely chronic microvascular ischemic changes. Prominent ventricles and sulci reflecting age-related volume loss. No extra-axial collection. No mass effect or midline shift. Intracranial vascular calcifications. ORBITS: Bilateral lens replacements noted. SINUSES: No acute abnormality. SOFT TISSUES AND SKULL: No acute soft tissue abnormality. No skull fracture. IMPRESSION: 1. No acute intracranial abnormality. 2. Moderate chronic microvascular ischemia. 3. Age-related cerebral volume loss. Electronically signed by: Donnice Mania MD 01/05/2024 04:53 PM EST RP Workstation: [id number]   Family History Reviewed and non-contributory, no pertinent history of problems with bleeding or anesthesia    Review of Systems No fevers or chills No numbness or tingling No chest pain No shortness of breath No bowel or bladder dysfunction No GI distress No headaches    OBJECTIVE  Vitals:Patient Vitals for the past 8 hrs:  BP Temp Temp src Pulse Resp SpO2  01/06/24 0504 121/70 98.1 F (36.7 C) Oral 67 18 97 %  01/06/24 0137 (!) 140/68 98.7 F (37.1 C) Oral 67 18 98 %   General: Alert, no acute distress Cardiovascular: Extremities are warm Respiratory: No cyanosis, no use of accessory  musculature Skin: No lesions in the area of chief complaint  Neurologic: Sensation intact distally  Psychiatric: Patient is competent for consent with normal mood and affect Lymphatic: No swelling obvious and reported other than the area involved in the exam below Extremities  LLE: Extremity held in a fixed position.  ROM deferred due to known fracture.  Sensation is intact distally in the sural, saphenous, DP, SP, and plantar nerve distribution. 2+ DP pulse.  Toes are WWP.  Active motion intact in the TA/EHL/GS. RLE: Sensation is intact distally in the sural, saphenous, DP, SP, and plantar nerve distribution. 2+ DP pulse.  Toes are WWP.  Active motion intact in the TA/EHL/GS. Tolerates gentle ROM of the hip.  No pain with axial loading.     Test Results Imaging XR of the Left Hip demonstrates a Intertrochanteric femur fracture.  Labs cbc Recent Labs    01/05/24 1631 01/06/24 0500  WBC 9.4 11.6*  HGB 13.9 12.8  HCT 43.1 39.6  PLT 224 191    Recent Labs    01/05/24 1631 01/06/24 0500  NA 139 136  K 3.9 4.1  CL 103 100  CO2 24 27  GLUCOSE 111* 118*  BUN 18 15  CREATININE 0.80 0.76  CALCIUM 8.8* 8.7*

## 2024-01-06 NOTE — Plan of Care (Signed)

## 2024-01-06 NOTE — Op Note (Signed)
 Orthopaedic Surgery Operative Note (CSN: [id number])  Lauren House  [birth year] Date of Surgery: 01/06/2024   Diagnoses:  Left intertrochanteric femur fracture  Procedure: Cephalomedullary nail for Left intertrochanteric femur fracture   Operative Finding Successful completion of the planned procedure.   10 mm x 360 mm x 125 degree cephalomedullary nail    Post-Op Diagnosis: Same Surgeons:Primary: Onesimo Oneil LABOR, MD Assistants:  N/A Location: [facility] OR ROOM 4 Anesthesia: General with local anesthesia Antibiotics: Clindamycin  Tourniquet time: N/A Estimated Blood Loss: 200 cc Complications: None Specimens: None  Implants: Implant Name Type Inv. Item Serial No. Manufacturer Lot No. LRB No. Used Action  NAIL LT [id number] TROCHANTERIC - [id number] Nail NAIL LT [id number] TROCHANTERIC  [employer] [id number] Left 1 Implanted  SCREW TELESCOPING LAG 10.5X90 - [id number] Screw SCREW TELESCOPING LAG 10.5X90  [employer] [id number] Left 1 Implanted  SCREW CORT CAPT FT 5.0X36 - [id number] Screw SCREW CORT CAPT FT 5.0X36  [employer] [id number] Left 1 Implanted    Indications for Surgery:   Lauren House is a 88 y.o. female who had a mechanical fall and sustained a Left intertrochanteric femur fracture.  I recommended operative fixation to restore stability and allow the patient to ambulate immediately postop.  Benefits and risks of operative and nonoperative management were discussed prior to surgery with the patient and informed consent form was completed.  Specific risks including infection, need for additional surgery, persistent pain, bleeding, malunion, nonunion and more severe complications associated with anesthesia.  The patient/guardian elected to proceed and surgical consent was obtained.    Procedure:   The patient was identified properly. Informed consent was obtained and the surgical site was marked. The patient was taken to the OR where general anesthesia was induced.   The patient  was placed supine on a fracture table and appropriate reduction was obtained and visualized on fluoroscopy prior to the beginning of the procedure.  Timeout was performed before the beginning of the case.  Clindamycin  dosing was confirmed prior to making incision.  The patient received TXA prior to the start of surgery.   We made an incision proximal to the greater trochanter and dissected down through the fascia.  We then carefully placed a guidepin, localizing under fluoroscopy.  Once satisfied with the starting point, the entry reamer was used to gain entry into the intramedullary canal.  A ball tip guidewire was then introduced and passed to an appropriate level at the physeal scar of the distal femur and measurement was obtained proximally using fluoroscopy.  We selected the appropriate length of nail, as noted above.  Entry reamer was used needed but the nail was unreamed.  At this point we placed our nail localizing under fluoroscopy, and confirmed that it was at the appropriate level.  Next we used the outrigger device to pass a wire into the femoral neck, and then the cephalomedullary lag screw.  The screw was locked proximally to avoid over collapse.  We then turned our attention to the distal interlocking screw.  Once again, we used the outrigger device to place a single interlocking screw in the midshaft area.  The outrigger device was removed and final fluoroscopic images were obtained.  The wounds were thoroughly irrigated closed in a multilayer fashion with 0 vicryl, 2-0 monocryl and 4-0 monocryl.  Sterile dressings were placed.  The patient was awoken from general anesthesia and taken to the PACU in stable condition without complication.     Post-operative plan:  Weightbearing: The patient will  be WBAT on the operative extremity.   DVT prophylaxis per primary team, no orthopedic contraindications.  Recommend 81 mg Aspirin  BID, unless patient cannot tolerate or was previously on  anticoagulation.  Prefer to start Ppx POD#1 Pain control with PRN pain medication preferring oral medicines.   Dressing can be reinforced as needed, will change on POD#2/3 if needed.  Patient does not need to remain hospitalized for dressing change Follow up plan: approximately 2 weeks postop for incision check and XR.  If the patient will be returning to a nursing facility, sutures can be trimmed around this time and a follow up appointment can be scheduled for 6 weeks after surgery. XR at next visit:  please obtain [facility] pelvis, and 2 views of the Left femur

## 2024-01-06 NOTE — Progress Notes (Signed)
*  PRELIMINARY RESULTS* Echocardiogram 2D Echocardiogram has been performed.  Lauren House 01/06/2024, 11:43 AM

## 2024-01-06 NOTE — Anesthesia Preprocedure Evaluation (Signed)
 Anesthesia Evaluation  Patient identified by MRN, date of birth, ID band Patient awake    Reviewed: Allergy & Precautions, H&P , NPO status , Patient's Chart, lab work & pertinent test results, reviewed documented beta blocker date and time   Airway Mallampati: II  TM Distance: >3 FB Neck ROM: full    Dental no notable dental hx.    Pulmonary neg pulmonary ROS, shortness of breath   Pulmonary exam normal breath sounds clear to auscultation       Cardiovascular Exercise Tolerance: Good hypertension, + angina  + CAD and + DOE  negative cardio ROS  Rhythm:regular Rate:Normal     Neuro/Psych   Anxiety     negative neurological ROS  negative psych ROS   GI/Hepatic negative GI ROS, Neg liver ROS,,,  Endo/Other  negative endocrine ROSdiabetes    Renal/GU negative Renal ROS  negative genitourinary   Musculoskeletal   Abdominal   Peds  Hematology negative hematology ROS (+)   Anesthesia Other Findings   Reproductive/Obstetrics negative OB ROS                              Anesthesia Physical Anesthesia Plan  ASA: 3  Anesthesia Plan: General and General LMA   Post-op Pain Management:    Induction:   PONV Risk Score and Plan: Ondansetron   Airway Management Planned:   Additional Equipment:   Intra-op Plan:   Post-operative Plan:   Informed Consent: I have reviewed the patients History and Physical, chart, labs and discussed the procedure including the risks, benefits and alternatives for the proposed anesthesia with the patient or authorized representative who has indicated his/her understanding and acceptance.     Dental Advisory Given  Plan Discussed with: CRNA  Anesthesia Plan Comments:         Anesthesia Quick Evaluation

## 2024-01-06 NOTE — Progress Notes (Signed)
 [facility] PROGRESS NOTE  DONAE KUEKER (DOB: [birth year]) FMW:[id number] PCP: Duanne Butler DASEN, MD  Brief Narrative: Lauren House is an 88 y.o. female with a history of CAD, T2DM, HTN who presented to the ED on 01/05/2024 after a fall at home where she stood up after a nap and felt lightheaded. She had left hip pain and was noted to be hypoxemic to 89% in the ED. Workup additionally revealing an acute displaced intertrochanteric left femur fracture for which orthopedics was consulted and the patient was admitted.    Chest x-ray showed widening mediastinum, and new rightward deviation of the trachea.  CTA chest recommended-scattered ground glass attenuation in the lungs bilaterally-edema or pneumonitis.  Other findings suggesting pulmonary artery hypertension, thyroid  nodule.   Subjective: Left hip pain with any movement, didn't lose consciousness when she fell did feel weak one time at [employer] but better after she ate, didn't fall that time. Has some short-windedness when she walks around since I turned 82. Then her family says she's actually quite active and doesn't usually appear out of breath at all. No chest pain now or recently, no leg swelling now or recently. doesn't feel short of breath at rest now or recently.   Objective: BP 121/70 (BP Location: Right Arm)   Pulse 67   Temp 98.1 F (36.7 C) (Oral)   Resp 18   Ht 5' 7 (1.702 m)   Wt 67.1 kg   SpO2 97%   BMI 23.18 kg/m   Gen: Pleasant elderly female in no distress Pulm: Nonlabored, clear anterolaterally  CV: RRR, no MRG, no pitting edema GI: Soft, NT, ND, +BS  Neuro: Alert and oriented. No new focal deficits. Ext: Warm, dry, LLE fixed, NVI distally Skin: No rashes, lesions or ulcers on visualized skin   Assessment & Plan: Closed, displaced intertrochanteric left femur fracture: After GLF.  - Orthopedics consulted, recommending NWB LLE, pain control, holding pharmacologic VTE ppx (will use SCDs if OR delayed). To  OR planned 11/10 for intramedullary rod fixation.  - Continue judicious pain control  Acute hypoxia: 89% SpO2 on room air without symptoms. CTA chest-negative for PE, suggest possible edema or pneumonitis, also pulmonary artery hypertension. No consolidation, fever, and undetectable PCT. Troponin normal x2.  - Repeat echo pending (normal LVEF last echo), Pro-BNP elevated at 1064. Given lasix x1.  - Further work up will be depending on echo.    Fall/dizziness: History compatible with orthostatic hypotension, hypoxia possibly contributed. No PE by CTA. Head CT nonacute.  - PT/OT postoperatively is planned.   Incidental 1.5cm right thyroid  nodule: Noted on CTA chest.  - Thyroid  U/S recommended after discharge.  - Check TSH (was 2.22 in 2017)  HTN:  - Continue metoprolol    Diet-controlled T2DM: HbA1c 6.3%.      Bernardino KATHEE Come, MD [facility] [email or web address] 01/06/2024, 12:48 PM

## 2024-01-06 NOTE — Progress Notes (Signed)
 Patient came back from surgery to floor and sats were low.  Patient was placed on HFNC at 15L and came up to 95%.  Placed humidification bottle on and patient sats were increased to 96%.  Have watched patient and tried to wean.  Patient is currently on 5L [place] with sat running between 91 to 92%.  Patient is hooked up to dinamap so staff can keep an eye on O2 sats.  Will continue to monitor and wean as tolerated.

## 2024-01-06 NOTE — Transfer of Care (Signed)
 Immediate Anesthesia Transfer of Care Note  Patient: Lauren House  Procedure(s) Performed: FIXATION, FRACTURE, INTERTROCHANTERIC, WITH INTRAMEDULLARY ROD (Left: Hip)  Patient Location: PACU  Anesthesia Type:General  Level of Consciousness: awake and drowsy  Airway & Oxygen Therapy: Patient Spontanous Breathing and Patient connected to face mask oxygen  Post-op Assessment: Report given to RN and Post -op Vital signs reviewed and stable  Post vital signs: Reviewed and stable  Last Vitals:  Vitals Value Taken Time  BP 111/65 01/06/24 17:26  Temp 98   Pulse 85 01/06/24 17:26  Resp 24 01/06/24 17:26  SpO2 91 % 01/06/24 17:26  Vitals shown include unfiled device data.  Last Pain:  Vitals:   01/06/24 1500  TempSrc: Oral  PainSc: 0-No pain      Patients Stated Pain Goal: 4 (01/06/24 1500)  Complications: No notable events documented.

## 2024-01-06 NOTE — TOC Initial Note (Signed)
 Transition of Care [facility]) - Initial/Assessment Note    Patient Details  Name: Lauren House MRN: [id number] Date of Birth: [birth year]  Transition of [facility]) CM/SW Contact:    Noreen KATHEE Cleotilde ISRAEL Phone Number: 01/06/2024, 11:51 AM  Clinical Narrative:                  CSW spoke with daughter who is at bedside with patient. CSW assessed patient due to pending evaluation for PT/OT after surgery. Daughter confirmed that patient lives alone and has a cane and walker. Daughter reports that patient was independent at home, still driving, and did not use any equipment. Daughter and patient was made aware of SNF and HH. Patient shook her head when mentioning SNF. CSW did shared options and difference between SNF vs. HH. ICM will continue to follow for recommendation.     Barriers to Discharge: Continued Medical Work up   Patient Goals and CMS Choice Patient states their goals for this hospitalization and ongoing recovery are:: return back home CMS Medicare.gov Compare Post Acute Care list provided to:: Patient Represenative (must comment) Choice offered to / list presented to : Patient, Adult Children      Expected Discharge Plan and Services     Post Acute Care Choice: Durable Medical Equipment Living arrangements for the past 2 months: Single Family Home                                      Prior Living Arrangements/Services Living arrangements for the past 2 months: Single Family Home Lives with:: Self Patient language and need for interpreter reviewed:: Yes Do you feel safe going back to the place where you live?: Yes      Need for Family Participation in Patient Care: Yes (Comment) Care giver support system in place?: No (comment)   Criminal Activity/Legal Involvement Pertinent to Current Situation/Hospitalization: No - Comment as needed  Activities of Daily Living   ADL Screening (condition at time of admission) Independently performs ADLs?: Yes  (appropriate for developmental age) Is the patient deaf or have difficulty hearing?: No Does the patient have difficulty seeing, even when wearing glasses/contacts?: No Does the patient have difficulty concentrating, remembering, or making decisions?: No  Permission Sought/Granted      Share Information with NAME: babbi     Permission granted to share info w Relationship: daughter     Emotional Assessment Appearance:: Appears stated age Attitude/Demeanor/Rapport: Engaged Affect (typically observed): Accepting Orientation: : Oriented to Self, Oriented to Situation, Oriented to Place Alcohol / Substance Use: Not Applicable Psych Involvement: No (comment)  Admission diagnosis:  Closed left hip fracture (HCC) [S72.002A] Closed displaced intertrochanteric fracture of left femur, initial encounter (HCC) [S72.142A] Fall, initial encounter [W19.XXXA] Patient Active Problem List   Diagnosis Date Noted   Closed left hip fracture (HCC) 01/05/2024   Thyroid  nodule 01/05/2024   Acute hypoxic respiratory failure (HCC) 01/05/2024   Pain in joint of right knee 10/04/2021   Acquired trigger finger of left ring finger 06/15/2021   Blood clotting disorder 08/15/2020   Diabetes mellitus (HCC) 07/09/2017   Coronary artery disease due to lipid rich plaque 01/01/2017   CAD S/P percutaneous coronary angioplasty 07/22/2015   DOE (dyspnea on exertion)    Unstable angina (HCC) 07/12/2015   Heart palpitations 02/14/2015   Hip bursitis 03/13/2014   Trochanteric bursitis of left hip 03/12/2014   Exertional chest pain 02/10/2014  Preoperative cardiovascular examination 02/10/2014   Angina pectoris, variant 12/24/2012   Left hip pain 12/16/2012   Bursitis of left hip 12/16/2012   Osteoarthritis, hip, bilateral 12/16/2012   Chest pain 08/15/2012   Apical variant hypertrophic cardiomyopathy (HCC) 08/15/2012   Bilateral inguinal hernia, right symptomatic 09/21/2011   COMEDO 10/18/2008    POSTMENOPAUSAL OSTEOPOROSIS 09/01/2008   HIP PAIN, LEFT 01/19/2008   Carcinoma in situ of skin 10/02/2006   VARICOSE VEIN 09/24/2006   ACTINIC KERATOSIS 09/24/2006   NECK PAIN 09/24/2006   Backache 09/24/2006   NEOP, BNG, SCALP/SKIN, NECK 04/16/2006   FIBROIDS, UTERUS 04/16/2006   Mixed hyperlipidemia 04/16/2006   Macular degeneration (senile) of retina 04/16/2006   Essential hypertension 04/16/2006   Allergic rhinitis 04/16/2006   OVERACTIVE BLADDER 04/16/2006   URINARY INCONTINENCE 04/16/2006   SKIN CANCER, HX OF 04/16/2006   VERTIGO, HX OF 04/16/2006   RENAL CALCULUS, HX OF 04/16/2006   PCP:  Duanne Butler DASEN, MD Pharmacy:   [facility] [address], [place] - [address] [place] [address] [place] [address] Phone: [phone] Fax: [phone]     Social Drivers of Health (SDOH) Social History: SDOH Screenings   Food Insecurity: No Food Insecurity (01/05/2024)  Housing: Low Risk  (01/05/2024)  Transportation Needs: No Transportation Needs (01/05/2024)  Utilities: Not At Risk (01/05/2024)  Alcohol Screen: Low Risk  (06/06/2023)  Depression (PHQ2-9): Low Risk  (10/22/2023)  Financial Resource Strain: Low Risk  (06/06/2023)  Physical Activity: Insufficiently Active (06/06/2023)  Social Connections: Moderately Isolated (01/05/2024)  Stress: No Stress Concern Present (06/06/2023)  Tobacco Use: Low Risk  (01/05/2024)  Health Literacy: Adequate Health Literacy (06/06/2023)   SDOH Interventions:     Readmission Risk Interventions    01/06/2024   11:49 AM  Readmission Risk Prevention Plan  Medication Screening Complete  Transportation Screening Complete

## 2024-01-06 NOTE — Progress Notes (Signed)
 With pt's verbal consent, Pt's daughter signed consent for Operative fixation of left intertrochanteric femur fracture . Consent witnessed by author and placed in pt's paper chart.

## 2024-01-07 ENCOUNTER — Inpatient Hospital Stay (HOSPITAL_COMMUNITY)

## 2024-01-07 ENCOUNTER — Encounter (HOSPITAL_COMMUNITY): Payer: Self-pay | Admitting: Orthopedic Surgery

## 2024-01-07 DIAGNOSIS — S72002A Fracture of unspecified part of neck of left femur, initial encounter for closed fracture: Secondary | ICD-10-CM | POA: Diagnosis not present

## 2024-01-07 LAB — CBC
HCT: 40 % (ref 36.0–46.0)
Hemoglobin: 12.8 g/dL (ref 12.0–15.0)
MCH: 28.4 pg (ref 26.0–34.0)
MCHC: 32 g/dL (ref 30.0–36.0)
MCV: 88.9 fL (ref 80.0–100.0)
Platelets: 160 K/uL (ref 150–400)
RBC: 4.5 MIL/uL (ref 3.87–5.11)
RDW: 13.2 % (ref 11.5–15.5)
WBC: 12.7 K/uL — ABNORMAL HIGH (ref 4.0–10.5)
nRBC: 0 % (ref 0.0–0.2)

## 2024-01-07 LAB — BASIC METABOLIC PANEL WITH GFR
Anion gap: 12 (ref 5–15)
BUN: 18 mg/dL (ref 8–23)
CO2: 25 mmol/L (ref 22–32)
Calcium: 8.6 mg/dL — ABNORMAL LOW (ref 8.9–10.3)
Chloride: 99 mmol/L (ref 98–111)
Creatinine, Ser: 0.84 mg/dL (ref 0.44–1.00)
GFR, Estimated: >60 mL/min (ref >60)
Glucose, Bld: 147 mg/dL — ABNORMAL HIGH (ref 70–99)
Potassium: 4.6 mmol/L (ref 3.5–5.1)
Sodium: 136 mmol/L (ref 135–145)

## 2024-01-07 LAB — TSH: TSH: 0.74 u[IU]/mL (ref 0.350–4.500)

## 2024-01-07 NOTE — Evaluation (Signed)
 Occupational Therapy Evaluation Patient Details Name: Lauren House MRN: [id number] DOB: 1935-03-12 Today's Date: 01/07/2024   History of Present Illness   Lauren House is a 88 y.o. female with medical history significant for coronary artery disease, diabetes mellitus, hypertension.  Patient was brought to the ED with reports of a fall.  Patient was sitting taking a nap, she got up felt lightheaded and fell.  She has history of vertigo but not dizziness like today.  She denies prior episodes of dizziness.  She reports an episode recently, but her blood sugars were low at that time.  She reports good oral intake, no vomiting no diarrhea.  Per daughter, patient is very active at baseline.  No chest pain no difficulty breathing no cough. (per MD)     Clinical Impressions Pt agreeable to OT and PT co-evaluation. Pt is independent at baseline without use of AD. Today pt required mo A for bed mobility and EOB to chair transfer with RW. Pt is unable to complete lower body ADL's without max to total assist. Set up assist needed for upper body ADL's. Pt left in the chair with call bell within reach. Pt will benefit from continued OT in the hospital to increase strength, balance, and endurance for safe ADL's.        If plan is discharge home, recommend the following:   A lot of help with walking and/or transfers;A lot of help with bathing/dressing/bathroom;Assistance with cooking/housework;Assist for transportation;Help with stairs or ramp for entrance     Functional Status Assessment   Patient has had a recent decline in their functional status and demonstrates the ability to make significant improvements in function in a reasonable and predictable amount of time.     Equipment Recommendations   None recommended by OT             Precautions/Restrictions   Precautions Precautions: Fall Recall of Precautions/Restrictions: Intact Restrictions Weight Bearing Restrictions Per  Provider Order: Yes LLE Weight Bearing Per Provider Order: Weight bearing as tolerated     Mobility Bed Mobility Overal bed mobility: Needs Assistance Bed Mobility: Supine to Sit     Supine to sit: Mod assist     General bed mobility comments: labored movement; assist to move B LE to EOB and pull trunk to sit.    Transfers Overall transfer level: Needs assistance Equipment used: Rolling walker (2 wheels) Transfers: Sit to/from Stand, Bed to chair/wheelchair/BSC Sit to Stand: Mod assist     Step pivot transfers: Min assist, Mod assist     General transfer comment: labored movement; extended time; cuing for use of RW      Balance Overall balance assessment: Needs assistance Sitting-balance support: Bilateral upper extremity supported, Feet supported Sitting balance-Leahy Scale: Fair Sitting balance - Comments: Fair at EOB   Standing balance support: During functional activity, Reliant on assistive device for balance, Bilateral upper extremity supported Standing balance-Leahy Scale: Fair Standing balance comment: poor to fair with RW                           ADL either performed or assessed with clinical judgement   ADL Overall ADL's : Needs assistance/impaired     Grooming: Set up;Sitting   Upper Body Bathing: Set up;Sitting   Lower Body Bathing: Maximal assistance;Total assistance;Sitting/lateral leans   Upper Body Dressing : Set up;Sitting   Lower Body Dressing: Maximal assistance;Total assistance;Sitting/lateral leans Lower Body Dressing Details (indicate cue type and  reason): Pt unable to doff R LE sock during beif attempt at EOB. Toilet Transfer: Moderate assistance;Stand-pivot;Rolling walker (2 wheels) Toilet Transfer Details (indicate cue type and reason): EOB to chair with RW. Toileting- Clothing Manipulation and Hygiene: Maximal assistance;Sitting/lateral lean               Vision Baseline Vision/History: 1 Wears glasses;6 Macular  Degeneration Ability to See in Adequate Light: 2 Moderately impaired Patient Visual Report: No change from baseline Vision Assessment?: No apparent visual deficits (Other than baseline deficits)     Perception Perception: Not tested       Praxis Praxis: Not tested       Pertinent Vitals/Pain Pain Assessment Pain Assessment: Faces Faces Pain Scale: Hurts little more Pain Location: gluteal region; L LE Pain Descriptors / Indicators: Discomfort Pain Intervention(s): Monitored during session, Repositioned, Limited activity within patient's tolerance     Extremity/Trunk Assessment Upper Extremity Assessment Upper Extremity Assessment: Generalized weakness   Lower Extremity Assessment Lower Extremity Assessment: Defer to PT evaluation   Cervical / Trunk Assessment Cervical / Trunk Assessment: Kyphotic   Communication Communication Communication: No apparent difficulties   Cognition Arousal: Alert Behavior During Therapy: WFL for tasks assessed/performed Cognition: No apparent impairments                               Following commands: Intact       Cueing  General Comments   Cueing Techniques: Verbal cues;Tactile cues  Pt removed from supplemental O2 and desaturated to a low of 81%. Pt placed back on 2L with saturation still hovering around 88%.              Home Living Family/patient expects to be discharged to:: Private residence Living Arrangements: Alone Available Help at Discharge: Family;Friend(s) Type of Home: House Home Access: Stairs to enter [employer] of Steps: 1 Entrance Stairs-Rails: None Home Layout: One level     Bathroom Shower/Tub: Tub/shower unit;Walk-in shower   Bathroom Toilet: Handicapped height Bathroom Accessibility: Yes How Accessible: Accessible via wheelchair;Accessible via walker Home Equipment: Rolling Walker (2 wheels);Cane - quad;Cane - single point;Grab bars - tub/shower          Prior  Functioning/Environment Prior Level of Function : Independent/Modified Independent;Driving             Mobility Comments: Tourist information centre manager without AD; drives ADLs Comments: Independent ADL's; daughter assist with transportation to grocery store. Pt able to cook and clean.    OT Problem List: Decreased strength;Decreased activity tolerance;Impaired balance (sitting and/or standing)   OT Treatment/Interventions: Self-care/ADL training;Therapeutic exercise;Therapeutic activities;Patient/family education;Balance training;DME and/or AE instruction      OT Goals(Current goals can be found in the care plan section)   Acute Rehab OT Goals Patient Stated Goal: Improve function OT Goal Formulation: With patient Time For Goal Achievement: 01/21/24 Potential to Achieve Goals: Good   OT Frequency:  Min 3X/week    Co-evaluation PT/OT/SLP Co-Evaluation/Treatment: Yes Reason for Co-Treatment: To address functional/ADL transfers PT goals addressed during session: Mobility/safety with mobility OT goals addressed during session: ADL's and self-care                       End of Session Equipment Utilized During Treatment: Rolling walker (2 wheels);Gait belt  Activity Tolerance: Patient tolerated treatment well Patient left: in chair;with call bell/phone within reach  OT Visit Diagnosis: Unsteadiness on feet (R26.81);Other abnormalities of gait and mobility (R26.89);Muscle weakness (  generalized) (M62.81);History of falling (Z91.81)                Time: 9074-9050 OT Time Calculation (min): 24 min Charges:  OT General Charges $OT Visit: 1 Visit OT Evaluation $OT Eval Low Complexity: 1 Low  Kahleah Crass OT, MOT   Jayson Person 01/07/2024, 2:05 PM

## 2024-01-07 NOTE — Plan of Care (Signed)

## 2024-01-07 NOTE — Evaluation (Signed)
 Physical Therapy Evaluation Patient Details Name: Lauren House MRN: [id number] DOB: 1935-10-30 Today's Date: 01/07/2024  History of Present Illness  Lauren House is a 88 y.o. female with medical history significant for coronary artery disease, diabetes mellitus, hypertension.  Patient was brought to the ED with reports of a fall.  Patient was sitting taking a nap, she got up felt lightheaded and fell.  She has history of vertigo but not dizziness like today.  She denies prior episodes of dizziness.  She reports an episode recently, but her blood sugars were low at that time.  She reports good oral intake, no vomiting no diarrhea.  Per daughter, patient is very active at baseline.  No chest pain no difficulty breathing no cough.    Clinical Impression  Pt. Presented with LE general weakness secondary to LLE closed Hip fx. Pt was on room air during treatment and SpO2 was monitored. During bed mobility pt required Min A/ Mod A to EOB and for transfer from bed to chair pt. Used RW and Mod A. Pt SpO2 remained 91%- 92%. During side step ambulation pt. Required Mod A w/ RW and SpO2 was 92%. Towards the end of treatment when sitting in chair pt. SpO2 declined to 87%, Nursing staff used 2L of O2 ([place]) and SpO2 increased to 88%. Pt. Was left in chair w/ family, call bell and nursing staff, who was notified on the status of pt. Patient will benefit from continued skilled physical therapy in hospital and recommended venue below to increase strength, balance, endurance for safe ADLs and gait.       If plan is discharge home, recommend the following: A little help with walking and/or transfers;Assistance with cooking/housework;A little help with bathing/dressing/bathroom;Help with stairs or ramp for entrance;Assist for transportation   Can travel by private vehicle   No    Equipment Recommendations None recommended by PT  Recommendations for Other Services       Functional Status Assessment Patient  has had a recent decline in their functional status and demonstrates the ability to make significant improvements in function in a reasonable and predictable amount of time.     Precautions / Restrictions Precautions Precautions: Fall Recall of Precautions/Restrictions: Intact Restrictions Weight Bearing Restrictions Per Provider Order: Yes LLE Weight Bearing Per Provider Order: Weight bearing as tolerated      Mobility  Bed Mobility Overal bed mobility: Needs Assistance Bed Mobility: Supine to Sit     Supine to sit: Min assist, Mod assist     General bed mobility comments: Labored and slow to EOB    Transfers Overall transfer level: Needs assistance Equipment used: Rolling walker (2 wheels) Transfers: Sit to/from Stand, Bed to chair/wheelchair/BSC Sit to Stand: Mod assist   Step pivot transfers: Min assist, Mod assist       General transfer comment: Pt used RW    Ambulation/Gait Ambulation/Gait assistance: Min assist, Mod assist Gait Distance (Feet): 9 Feet Assistive device: Rolling walker (2 wheels) Gait Pattern/deviations: Step-to pattern, Decreased step length - right, Decreased step length - left, Decreased stance time - right, Decreased stance time - left, Decreased stride length, Trunk flexed       General Gait Details: Pt. was able to side step ambulate w/RW and Mod A. Pt felt fatigued and in pain in LLE towards the end.  Stairs            Wheelchair Mobility     Tilt Bed    Modified Rankin (Stroke Patients Only)  Balance Overall balance assessment: Needs assistance Sitting-balance support: Bilateral upper extremity supported, Feet supported Sitting balance-Leahy Scale: Fair Sitting balance - Comments: Fair at EOB   Standing balance support: During functional activity, Reliant on assistive device for balance, Bilateral upper extremity supported Standing balance-Leahy Scale: Fair Standing balance comment: Fair/ poor w/ RW                              Pertinent Vitals/Pain Pain Assessment Pain Assessment: No/denies pain    Home Living Family/patient expects to be discharged to:: Private residence Living Arrangements: Alone Available Help at Discharge: Family;Friend(s) Type of Home: House Home Access: Stairs to enter Entrance Stairs-Rails: None Entrance Stairs-Number of Steps: 1   Home Layout: One level Home Equipment: Agricultural Consultant (2 wheels);Cane - quad;Cane - single point;Grab bars - toilet;Grab bars - tub/shower      Prior Function Prior Level of Function : Independent/Modified Independent;Driving             Mobility Comments: WNL ADLs Comments: daugher would assist if needed     Extremity/Trunk Assessment   Upper Extremity Assessment Upper Extremity Assessment: Defer to OT evaluation    Lower Extremity Assessment Lower Extremity Assessment: Generalized weakness    Cervical / Trunk Assessment Cervical / Trunk Assessment: Kyphotic  Communication   Communication Communication: No apparent difficulties    Cognition Arousal: Alert Behavior During Therapy: WFL for tasks assessed/performed   PT - Cognitive impairments: No apparent impairments                         Following commands: Intact       Cueing Cueing Techniques: Verbal cues, Tactile cues     General Comments General comments (skin integrity, edema, etc.): Pt. was on room air O2 during treatment and reminded at 91% towards the end of treatment while sitting pt. SpO2 declined to 87% on room air. Pt was put back on 2L ([place]) and SpO2 was 88%    Exercises     Assessment/Plan    PT Assessment Patient needs continued PT services  PT Problem List Decreased strength;Decreased range of motion;Decreased activity tolerance;Decreased balance;Decreased mobility;Decreased coordination;Decreased safety awareness       PT Treatment Interventions DME instruction;Gait training;Stair training;Patient/family  education;Functional mobility training;Therapeutic activities;Therapeutic exercise;Balance training    PT Goals (Current goals can be found in the Care Plan section)  Acute Rehab PT Goals Patient Lauren Goal: Pt. wants to attend SNF to regain strength to return home. PT Goal Formulation: With patient/family Time For Goal Achievement: 01/18/24 Potential to Achieve Goals: Good    Frequency Min 3X/week     Co-evaluation PT/OT/SLP Co-Evaluation/Treatment: Yes Reason for Co-Treatment: To address functional/ADL transfers PT goals addressed during session: Mobility/safety with mobility OT goals addressed during session: ADL's and self-care       AM-PAC PT 6 Clicks Mobility  Outcome Measure Help needed turning from your back to your side while in a flat bed without using bedrails?: A Little Help needed moving from lying on your back to sitting on the side of a flat bed without using bedrails?: A Little Help needed moving to and from a bed to a chair (including a wheelchair)?: A Little Help needed standing up from a chair using your arms (e.g., wheelchair or bedside chair)?: A Lot Help needed to walk in hospital room?: A Lot Help needed climbing 3-5 steps with a railing? : A Lot 6  Click Score: 15    End of Session Equipment Utilized During Treatment: Oxygen;Gait belt Activity Tolerance: Patient tolerated treatment well;Patient limited by fatigue Patient left: in chair;with family/visitor present;with call bell/phone within reach Nurse Communication: Mobility status PT Visit Diagnosis: Other abnormalities of gait and mobility (R26.89);Muscle weakness (generalized) (M62.81);History of falling (Z91.81)    Time: 9076-9053 PT Time Calculation (min) (ACUTE ONLY): 23 min   Charges:   PT Evaluation $PT Eval Moderate Complexity: 1 Mod PT Treatments $Therapeutic Activity: 23-37 mins PT General Charges $$ ACUTE PT VISIT: 1 Visit         Shylo Zamor, SPT

## 2024-01-07 NOTE — Progress Notes (Signed)
   ORTHOPAEDIC PROGRESS NOTE  s/p Procedure(s): Cephalomedullary nail for left intertrochanteric femur fracture  DOS: 01/06/2024  SUBJECTIVE: No issues overnight.  Pain is controlled.  She reports that she was trying to get out of bed overnight, and removing monitors.  She remembers doing this.  OBJECTIVE: PE:  Alert and oriented.  No acute distress.  Lateral hip surgical dressings are clean, dry and intact.  There is some bruising.  Sensation is intact to the dorsum of the foot.  Active motion intact in EHL/TA.  Vitals:   01/07/24 0302 01/07/24 0459  BP:  (!) 152/81  Pulse:  91  Resp:  18  Temp:  98 F (36.7 C)  SpO2: 94% 100%     ASSESSMENT: JAVEN RIDINGS is a 88 y.o. female doing well postoperatively.  PLAN: Weightbearing: WBAT LLE Incisional and dressing care: Reinforce dressings as needed Orthopedic device(s): None VTE prophylaxis: This can be initiated on postop day 1.  Recommend aspirin  81 mg twice daily. Pain control: As needed.  Judicious use of narcotics Follow - up plan: Follow-up in approximate 10-14 days.  There are no sutures that need to be removed.  If she is discharged to nursing facility, the sutures can be trimmed approximately 2 weeks after surgery.  I can see her back in 6 weeks.  If there are any questions or concerns, I am happy to see her in clinic at any time.   Contact information:     Shivali Quackenbush A. Onesimo, MD MS [facility] [address] [place],  [place]  [address] Phone: [phone] Fax: [phone]

## 2024-01-07 NOTE — Progress Notes (Signed)
 Lowered O2 on patient to 3L. Patient tolerated well for a few hours. Patient fell asleep and began breathing heavy per family member. Staff in the room and O2 was 88% and patient stated that she was having SOB. Increased O2 back up to 5L. On recheck, patient resting comfortably in the bed with no complaints. Will continue to monitor.

## 2024-01-07 NOTE — Progress Notes (Signed)
 [facility] PROGRESS NOTE  Lauren House (DOB: Oct 07, 1935) FMW:[id number] PCP: Duanne Butler DASEN, MD  Brief Narrative: Lauren House is an 88 y.o. female with a history of CAD, T2DM, HTN who presented to the ED on 01/05/2024 after a fall at home where she stood up after a nap and felt lightheaded. She had left hip pain and was noted to be hypoxemic to 89% in the ED. Workup additionally revealing an acute displaced intertrochanteric left femur fracture for which orthopedics was consulted and the patient was admitted.    Chest x-ray showed widening mediastinum, and new rightward deviation of the trachea.  CTA chest recommended-scattered ground glass attenuation in the lungs bilaterally-edema or pneumonitis.  Other findings suggesting pulmonary artery hypertension, thyroid  nodule.   Subjective: Some hypoxia after procedure but denies shortness of breath, some delirium which she recalls. Many family at the bedside.   Objective: BP 132/65 (BP Location: Right Arm)   Pulse 86   Temp 99.3 F (37.4 C) (Oral)   Resp 20   Ht 5' 7 (1.702 m)   Wt 67.1 kg   SpO2 92%   BMI 23.18 kg/m   Gen: Elderly pleasant flirtatious female in no distress Pulm: Clear, nonlabored CV: RRR, no MRG, no pitting edema GI: Soft, NT, ND, +BS  Neuro: Alert and oriented. No new focal deficits. Ext: Warm, no deformities Skin: Left lateral thigh/hip with 3 dressings all c/d/I without bruising, minimal tenderness, no erythema, compartment soft.   Assessment & Plan: Closed, displaced intertrochanteric left femur fracture: After GLF. Now s/p IM nail 11/10 by Dr. Onesimo.  - WBAT LLE, start ASA 81mg  BID for VTE ppx, reinforce dressing as needed. Per orthopedics: Follow-up in approximate 10-14 days. There are no sutures that need to be removed. If she is discharged to nursing facility, the sutures can be trimmed approximately 2 weeks after surgery. I can see her back in 6 weeks. If there are any questions or concerns, I  am happy to see her in clinic at any time.  - Continue judicious pain control.  Acute hypoxia: 89% SpO2 on room air without symptoms. CTA chest-negative for PE, suggest possible edema or pneumonitis, also pulmonary artery hypertension. No consolidation, fever, and undetectable PCT. Troponin normal x2. Pro-BNP elevated at 1064. Given lasix x1. Repeat CXR this morning does not show pulmonary edema, consolidation or effusion on my review. Echo with preserved LVEF, stable LVH. No remark of PASP.  - Wean to room air. Consider pulmonary follow up if unable to do so.    Fall/dizziness: History compatible with orthostatic hypotension, hypoxia possibly contributed. No PE by CTA. Head CT nonacute.  - PT/OT postoperatively > will pursue short term rehabilitation stay between hospital and home. TOC aware, FL2 signed.   Incidental 1.5cm right thyroid  nodule: Noted on CTA chest. TSH wnl.  - Thyroid  U/S recommended after discharge.   HTN:  - Continue metoprolol    Diet-controlled T2DM: HbA1c 6.3%.      Bernardino KATHEE Come, MD [facility] [email or web address] 01/07/2024, 3:26 PM

## 2024-01-07 NOTE — NC FL2 (Signed)
   MEDICAID [id number] LEVEL OF CARE FORM     IDENTIFICATION  Patient Name: Lauren House Birthdate: 1935/10/02 Sex: female Admission Date (Current Location): 01/05/2024  [facility] [place] and [place] Number:  [employer] and Address:  [facility],  [address], [place] [address]      Provider Number: [id number]  Attending Physician Name and Address:  Bryn Bernardino KATHEE, MD  Relative Name and Phone Number:  Garret Ada ( daughter) [phone]    Current Level of Care: Hospital Recommended Level of Care: Skilled Nursing Facility Prior Approval Number:    Date Approved/Denied:   PASRR Number:    Discharge Plan: SNF    Current Diagnoses: Patient Active Problem List   Diagnosis Date Noted   Closed left hip fracture (HCC) 01/05/2024   Thyroid  nodule 01/05/2024   Acute hypoxic respiratory failure (HCC) 01/05/2024   Pain in joint of right knee 10/04/2021   Acquired trigger finger of left ring finger 06/15/2021   Blood clotting disorder 08/15/2020   Diabetes mellitus (HCC) 07/09/2017   Coronary artery disease due to lipid rich plaque 01/01/2017   CAD S/P percutaneous coronary angioplasty 07/22/2015   DOE (dyspnea on exertion)    Unstable angina (HCC) 07/12/2015   Heart palpitations 02/14/2015   Hip bursitis 03/13/2014   Trochanteric bursitis of left hip 03/12/2014   Exertional chest pain 02/10/2014   Preoperative cardiovascular examination 02/10/2014   Angina pectoris, variant 12/24/2012   Left hip pain 12/16/2012   Bursitis of left hip 12/16/2012   Osteoarthritis, hip, bilateral 12/16/2012   Chest pain 08/15/2012   Apical variant hypertrophic cardiomyopathy (HCC) 08/15/2012   Bilateral inguinal hernia, right symptomatic 09/21/2011   COMEDO 10/18/2008   POSTMENOPAUSAL OSTEOPOROSIS 09/01/2008   HIP PAIN, LEFT 01/19/2008   Carcinoma in situ of skin 10/02/2006   VARICOSE VEIN 09/24/2006   ACTINIC KERATOSIS 09/24/2006   NECK PAIN 09/24/2006   Backache  09/24/2006   NEOP, BNG, SCALP/SKIN, NECK 04/16/2006   FIBROIDS, UTERUS 04/16/2006   Mixed hyperlipidemia 04/16/2006   Macular degeneration (senile) of retina 04/16/2006   Essential hypertension 04/16/2006   Allergic rhinitis 04/16/2006   OVERACTIVE BLADDER 04/16/2006   URINARY INCONTINENCE 04/16/2006   SKIN CANCER, HX OF 04/16/2006   VERTIGO, HX OF 04/16/2006   RENAL CALCULUS, HX OF 04/16/2006    Orientation RESPIRATION BLADDER Height & Weight     Self, Time, Situation, Place  O2 (5L) External catheter Weight: 148 lb (67.1 kg) Height:  5' 7 (170.2 cm)  BEHAVIORAL SYMPTOMS/MOOD NEUROLOGICAL BOWEL NUTRITION STATUS      Continent Diet (heart Healthy)  AMBULATORY STATUS COMMUNICATION OF NEEDS Skin   Extensive Assist   Normal                       Personal Care Assistance Level of Assistance  Bathing, Feeding, Dressing Bathing Assistance: Limited assistance Feeding assistance: Independent Dressing Assistance: Limited assistance     Functional Limitations Info  Sight, Hearing, Speech Sight Info: Impaired Hearing Info: Adequate Speech Info: Adequate    SPECIAL CARE FACTORS FREQUENCY  PT (By licensed PT), OT (By licensed OT)     PT Frequency: 5 x a week OT Frequency: 5 x a week            Contractures Contractures Info: Not present    Additional Factors Info  Code Status, Allergies Code Status Info: FULL Allergies Info: Aspirin , Atorvastatin, Atorvastatin Calcium, Bactrim (Sulfamethoxazole -trimethoprim), Chlorhexidine , Clarithromycin, Colestipol Hcl, Ezetimibe, Penicillamine, Prednisone ,  Simvastatin, Vancomycin , Cephalosporins, Ciprofloxacin, Penicillins, Tape           Current Medications (01/07/2024):  This is the current hospital active medication list Current Facility-Administered Medications  Medication Dose Route Frequency Provider Last Rate Last Admin   acetaminophen  (TYLENOL ) tablet 650 mg  650 mg Oral Q6H PRN Onesimo Oneil LABOR, MD   650 mg at  01/06/24 2018   Or   acetaminophen  (TYLENOL ) suppository 650 mg  650 mg Rectal Q6H PRN Onesimo Oneil LABOR, MD       fluticasone  (FLONASE ) 50 MCG/ACT nasal spray 2 spray  2 spray Each Nare Daily Onesimo Oneil LABOR, MD   2 spray at 01/07/24 0845   metoprolol  succinate (TOPROL -XL) 24 hr tablet 25 mg  25 mg Oral Daily Onesimo Oneil LABOR, MD   25 mg at 01/07/24 0845   morphine  (PF) 2 MG/ML injection 2 mg  2 mg Intravenous Q4H PRN Onesimo Oneil LABOR, MD   2 mg at 01/05/24 2140   mupirocin ointment (BACTROBAN) 2 % 1 Application  1 Application Nasal BID Onesimo Oneil LABOR, MD   1 Application at 01/07/24 0845   ondansetron  (ZOFRAN ) tablet 4 mg  4 mg Oral Q6H PRN Onesimo Oneil LABOR, MD       Or   ondansetron  (ZOFRAN ) injection 4 mg  4 mg Intravenous Q6H PRN Onesimo Oneil LABOR, MD       Oral care mouth rinse  15 mL Mouth Rinse PRN Onesimo Oneil LABOR, MD       polyethylene glycol (MIRALAX / GLYCOLAX) packet 17 g  17 g Oral Daily PRN Onesimo Oneil LABOR, MD       ranolazine  (RANEXA ) 12 hr tablet 500 mg  500 mg Oral BID Onesimo Oneil LABOR, MD   500 mg at 01/07/24 0845     Discharge Medications: Please see discharge summary for a list of discharge medications.  Relevant Imaging Results:  Relevant Lab Results:   Additional Information SSN: [id number]  Noreen KATHEE [place], [place]

## 2024-01-07 NOTE — Plan of Care (Signed)
  Problem: Acute Rehab OT Goals (only OT should resolve) Goal: Pt. Will Perform Grooming Flowsheets (Taken 01/07/2024 1408) Pt Will Perform Grooming:  with min assist  standing Goal: Pt. Will Perform Lower Body Bathing Flowsheets (Taken 01/07/2024 1408) Pt Will Perform Lower Body Bathing:  with modified independence  sitting/lateral leans  with adaptive equipment Goal: Pt. Will Perform Lower Body Dressing Flowsheets (Taken 01/07/2024 1408) Pt Will Perform Lower Body Dressing:  with modified independence  sitting/lateral leans  with adaptive equipment Goal: Pt. Will Transfer To Toilet Flowsheets (Taken 01/07/2024 1408) Pt Will Transfer to Toilet:  with modified independence  ambulating Goal: Pt. Will Perform Toileting-Clothing Manipulation Flowsheets (Taken 01/07/2024 1408) Pt Will Perform Toileting - Clothing Manipulation and hygiene:  with modified independence  sitting/lateral leans  with adaptive equipment Goal: Pt/Caregiver Will Perform Home Exercise Program Flowsheets (Taken 01/07/2024 1408) Pt/caregiver will Perform Home Exercise Program:  Increased strength  Both right and left upper extremity  Independently  Tyson Masin OT, MOT

## 2024-01-07 NOTE — Plan of Care (Signed)
  Problem: Acute Rehab PT Goals(only PT should resolve) Goal: Pt Will Go Supine/Side To Sit Outcome: Progressing Flowsheets (Taken 01/07/2024 1220) Pt will go Supine/Side to Sit: with minimal assist Goal: Pt Will Go Sit To Supine/Side Outcome: Progressing Flowsheets (Taken 01/07/2024 1220) Pt will go Sit to Supine/Side: with minimal assist Goal: Patient Will Perform Sitting Balance Outcome: Progressing Flowsheets (Taken 01/07/2024 1220) Patient will perform sitting balance: with minimal assist Goal: Patient Will Transfer Sit To/From Stand Outcome: Progressing Flowsheets (Taken 01/07/2024 1220) Patient will transfer sit to/from stand: with minimal assist Goal: Pt Will Transfer Bed To Chair/Chair To Bed Outcome: Progressing Flowsheets (Taken 01/07/2024 1220) Pt will Transfer Bed to Chair/Chair to Bed: with min assist Note: W/ RW Goal: Pt Will Perform Standing Balance Or Pre-Gait Outcome: Progressing Flowsheets (Taken 01/07/2024 1220) Pt will perform standing balance or pre-gait: with minimal assist Note: W/ RW Goal: Pt Will Ambulate Outcome: Progressing Flowsheets (Taken 01/07/2024 1220) Pt will Ambulate:  with minimal assist  25 feet Note: W/RW monitor SpO2    Dodie Parisi, SPT

## 2024-01-07 NOTE — TOC Progression Note (Signed)
 Transition of Care [facility]) - Progression Note    Patient Details  Name: Lauren House MRN: [id number] Date of Birth: Nov 03, 1935  [facility]) CM/SW Contact  Noreen KATHEE [place], [place] Phone Number: 01/07/2024, 2:21 PM  Clinical Narrative:     CSW spoke with patient at bedside about PT recommendation for SNF. Son and daughter n law were also at bedside . Patient agreeable to SNF and wanted referral sent to [facility]. Referral sent. CSW will continue to follow.   Expected Discharge Plan: Skilled Nursing Facility Barriers to Discharge: Continued Medical Work up    Expected Discharge Plan and Services     Post Acute Care Choice: Durable Medical Equipment Living arrangements for the past 2 months: Single Family Home  Social Drivers of Health (SDOH) Interventions SDOH Screenings   Food Insecurity: No Food Insecurity (01/05/2024)  Housing: Low Risk  (01/05/2024)  Transportation Needs: No Transportation Needs (01/05/2024)  Utilities: Not At Risk (01/05/2024)  Alcohol Screen: Low Risk  (06/06/2023)  Depression (PHQ2-9): Low Risk  (10/22/2023)  Financial Resource Strain: Low Risk  (06/06/2023)  Physical Activity: Insufficiently Active (06/06/2023)  Social Connections: Moderately Isolated (01/05/2024)  Stress: No Stress Concern Present (06/06/2023)  Tobacco Use: Low Risk  (01/06/2024)  Health Literacy: Adequate Health Literacy (06/06/2023)    Readmission Risk Interventions    01/07/2024    2:21 PM 01/06/2024   11:49 AM  Readmission Risk Prevention Plan  Medication Screening Complete Complete  Transportation Screening Complete Complete

## 2024-01-07 NOTE — Anesthesia Postprocedure Evaluation (Signed)
 Anesthesia Post Note  Patient: TAHANI POTIER  Procedure(s) Performed: FIXATION, FRACTURE, INTERTROCHANTERIC, WITH INTRAMEDULLARY ROD (Left: Hip)  Patient location during evaluation: Phase II Anesthesia Type: General Level of consciousness: awake Pain management: pain level controlled Vital Signs Assessment: post-procedure vital signs reviewed and stable Respiratory status: spontaneous breathing and respiratory function stable Cardiovascular status: blood pressure returned to baseline and stable Postop Assessment: no headache and no apparent nausea or vomiting Anesthetic complications: no Comments: Late entry   No notable events documented.   Last Vitals:  Vitals:   01/07/24 0302 01/07/24 0459  BP:  (!) 152/81  Pulse:  91  Resp:  18  Temp:  36.7 C  SpO2: 94% 100%    Last Pain:  Vitals:   01/07/24 0459  TempSrc: Oral  PainSc:                  Yvonna JINNY Bosworth

## 2024-01-08 DIAGNOSIS — E119 Type 2 diabetes mellitus without complications: Secondary | ICD-10-CM | POA: Diagnosis not present

## 2024-01-08 DIAGNOSIS — S72002A Fracture of unspecified part of neck of left femur, initial encounter for closed fracture: Secondary | ICD-10-CM | POA: Diagnosis not present

## 2024-01-08 DIAGNOSIS — J9601 Acute respiratory failure with hypoxia: Secondary | ICD-10-CM | POA: Diagnosis not present

## 2024-01-08 DIAGNOSIS — E041 Nontoxic single thyroid nodule: Secondary | ICD-10-CM | POA: Diagnosis not present

## 2024-01-08 NOTE — Hospital Course (Signed)
 88 y.o. female with a history of CAD, T2DM, HTN who presented to the ED on 01/05/2024 after a fall at home where she stood up after a nap and felt lightheaded. She had left hip pain and was noted to be hypoxemic to 89% in the ED. Workup additionally revealing an acute displaced intertrochanteric left femur fracture for which orthopedics was consulted and the patient was admitted.     Assessment and Plan:   Acute closed left intertrochanteric femur fracture - S/p IM nail 11/10 by orthopedic surgery.  WBAT LLE, aspirin  81 mg twice daily.  Per orthopedics, follow-up in approximately 10-14 days.  There are no sutures that need to be removed, sutures can be trimmed approximately 2 weeks after surgery.   Acute hypoxic respiratory failure - Etiology unclear, differentials include pneumonitis, CAP, edema.  No consolidation, fever,.  proBNP mildly elevated.  Lasix x 1.  Echo showing preserved EF, stable LVH.  Continue to wean O2 as tolerated.  If hypoxia persists, may warrant CT angio.   Level fall - Etiology likely combined mild hypoxia with orthostatic hypotension/volume depletion.  CT head negative.  PT OT postop, transitioning to STR.   Right thyroid  nodule - Incidental finding of 1.5 cm right thyroid  nodule on CTA chest.  TSH normal.  Thyroid  ultrasound recommended after discharge and PCP setting.   Diabetes mellitus - Insulin sliding scale on board.   Goals of care - Working closely with family, TOC on transition to SNF.  Awaiting resolution of hypoxia

## 2024-01-08 NOTE — TOC Progression Note (Signed)
 Transition of Care [facility]) - Progression Note    Patient Details  Name: Lauren House MRN: [id number] Date of Birth: 12/10/1935  [facility]) CM/SW Contact  Lucie Lunger, [place] Phone Number: 01/08/2024, 3:20 PM  Clinical Narrative:    CSW spoke with pt and daughter to review bed offer from [facility], they accept bed offer at this time. TOC to start insurance auth for SNF stay. TOC to follow.   Expected Discharge Plan: Skilled Nursing Facility Barriers to Discharge: Continued Medical Work up               Expected Discharge Plan and Services     Post Acute Care Choice: Durable Medical Equipment Living arrangements for the past 2 months: Single Family Home                                       Social Drivers of Health (SDOH) Interventions SDOH Screenings   Food Insecurity: No Food Insecurity (01/05/2024)  Housing: Low Risk  (01/05/2024)  Transportation Needs: No Transportation Needs (01/05/2024)  Utilities: Not At Risk (01/05/2024)  Alcohol Screen: Low Risk  (06/06/2023)  Depression (PHQ2-9): Low Risk  (10/22/2023)  Financial Resource Strain: Low Risk  (06/06/2023)  Physical Activity: Insufficiently Active (06/06/2023)  Social Connections: Moderately Isolated (01/05/2024)  Stress: No Stress Concern Present (06/06/2023)  Tobacco Use: Low Risk  (01/06/2024)  Health Literacy: Adequate Health Literacy (06/06/2023)    Readmission Risk Interventions    01/07/2024    2:21 PM 01/06/2024   11:49 AM  Readmission Risk Prevention Plan  Medication Screening Complete Complete  Transportation Screening Complete Complete

## 2024-01-08 NOTE — Progress Notes (Signed)
 Progress Note   Patient: Lauren House FMW:[id number] DOB: Oct 05, 1935 DOA: 01/05/2024  DOS: the patient was seen and examined on 01/08/2024   Brief hospital course:  88 y.o. female with a history of CAD, T2DM, HTN who presented to the ED on 01/05/2024 after a fall at home where she stood up after a nap and felt lightheaded. She had left hip pain and was noted to be hypoxemic to 89% in the ED. Workup additionally revealing an acute displaced intertrochanteric left femur fracture for which orthopedics was consulted and the patient was admitted.    Assessment and Plan:  Acute closed left intertrochanteric femur fracture - S/p IM nail 11/10 by orthopedic surgery.  WBAT LLE, aspirin  81 mg twice daily.  Per orthopedics, follow-up in approximately 10-14 days.  There are no sutures that need to be removed, sutures can be trimmed approximately 2 weeks after surgery.  Acute hypoxic respiratory failure - Etiology unclear, differentials include pneumonitis, CAP, edema.  No consolidation, fever,.  proBNP mildly elevated.  Lasix x 1.  Echo showing preserved EF, stable LVH.  Continue to wean O2 as tolerated.  If hypoxia persists, may warrant CT angio.  Level fall - Etiology likely combined mild hypoxia with orthostatic hypotension/volume depletion.  CT head negative.  PT OT postop, transitioning to STR.  Right thyroid  nodule - Incidental finding of 1.5 cm right thyroid  nodule on CTA chest.  TSH normal.  Thyroid  ultrasound recommended after discharge and PCP setting.  Diabetes mellitus - Insulin sliding scale on board.  Goals of care - Working closely with family, TOC on transition to SNF.  Awaiting resolution of hypoxia   Subjective: Patient sitting in bedside chair, family at bedside.  Patient in good spirits.  Has pain in her left hip as to be expected, denies any fever, chills, chest pain, nausea, vomiting, abdominal pain.  Currently on 5 L nasal cannula but O2 sats at 100%.  Physical  Exam:  Vitals:   01/07/24 1259 01/07/24 2032 01/08/24 0442 01/08/24 1211  BP: 132/65 (!) 147/77 (!) 148/82 138/89  Pulse: 86 89 86 76  Resp: 20  18 18   Temp: 99.3 F (37.4 C) 98.9 F (37.2 C) 98.2 F (36.8 C) 97.9 F (36.6 C)  TempSrc: Oral Oral Oral Oral  SpO2: 92% 93% 96% 96%  Weight:      Height:        GENERAL:  Alert, pleasant, no acute distress, frail HEENT:  EOMI, nasal cannula CARDIOVASCULAR:  RRR, no murmurs appreciated RESPIRATORY:  Clear to auscultation, no wheezing, rales, or rhonchi GASTROINTESTINAL:  Soft, nontender, nondistended EXTREMITIES:  No LE edema bilaterally NEURO:  No new focal deficits appreciated SKIN: Surgical bandage over left hip PSYCH:  Appropriate mood and affect     Data Reviewed:  Imaging Studies: DG CHEST PORT 1 VIEW Result Date: 01/07/2024 CLINICAL DATA:  Acute respiratory failure with hypoxia. EXAM: PORTABLE CHEST 1 VIEW COMPARISON:  Radiographs 01/05/2024 and 07/17/2023.  CT 01/05/2024. FINDINGS: 0807 hours. Stable elevation of the right hemidiaphragm with mild right basilar atelectasis. The lungs are otherwise clear. The heart size and mediastinal contours are stable with aortic and coronary artery atherosclerosis. No pleural effusion or pneumothorax. No acute osseous findings are evident. IMPRESSION: No evidence of acute cardiopulmonary process. Stable elevation of the right hemidiaphragm with mild right basilar atelectasis. Electronically Signed   By: Elsie Perone M.D.   On: 01/07/2024 12:23   DG FEMUR MIN 2 VIEWS LEFT Result Date: 01/06/2024 CLINICAL DATA:  Intraoperative images  depicting placement of proximal femoral IM nail system EXAM: LEFT FEMUR 2 VIEWS COMPARISON:  CT scan 01/05/2024 FINDINGS: Intraoperative fluoroscopic spot images demonstrate and left femoral head nail system in place with near anatomic alignment and no complicating feature observed. IMPRESSION: 1. Left femoral head nail system in place with near anatomic  alignment and no complicating feature observed. Electronically Signed   By: Ryan Salvage M.D.   On: 01/06/2024 18:18   DG FEMUR MIN 2 VIEWS LEFT Result Date: 01/06/2024 CLINICAL DATA:  Left proximal femur intertrochanteric fracture EXAM: LEFT FEMUR 2 VIEWS COMPARISON:  CT scan 11/03/2023 FINDINGS: Left proximal femoral IM nail system in place with long IM nail with transverse interlocking diaphyseal screw. No periprosthetic fracture or new acute bony findings. Expected postoperative findings including gas in the soft tissues. Medial compartmental articular space narrowing in the left knee. Bony demineralization. IMPRESSION: 1. Left proximal femoral IM nail system in place without complicating feature. 2. Medial compartmental articular space narrowing in the left knee. 3. Bony demineralization. Electronically Signed   By: Ryan Salvage M.D.   On: 01/06/2024 18:17   DG Pelvis Portable Result Date: 01/06/2024 CLINICAL DATA:  Left hip intertrochanteric fracture status post IM nail system fixation EXAM: PORTABLE PELVIS 1-2 VIEWS COMPARISON:  CT scan 01/05/2024 FINDINGS: Left proximal femoral IM nail system in place traversing the prior intertrochanteric fracture with near anatomic alignment. Contrast medium in the urinary bladder. Bony demineralization. Spurring of the right femoral head. IMPRESSION: 1. Left proximal femoral IM nail system in place traversing the prior intertrochanteric fracture with near anatomic alignment. Electronically Signed   By: Ryan Salvage M.D.   On: 01/06/2024 18:15   DG C-Arm 1-60 Min-No Report Result Date: 01/06/2024 Fluoroscopy was utilized by the requesting physician.  No radiographic interpretation.   ECHOCARDIOGRAM COMPLETE Result Date: 01/06/2024    ECHOCARDIOGRAM REPORT   Patient Name:   Lauren House Date of Exam: 01/06/2024 Medical Rec #:  [id number]      Height:       67.0 in Accession #:    [id number]     Weight:       148.0 lb Date of Birth:  1935-11-14       BSA:          1.779 m Patient Age:    88 years       BP:           121/70 mmHg Patient Gender: F              HR:           67 bpm. Exam Location:  [facility] Procedure: 2D Echo, Cardiac Doppler and Color Doppler (Both Spectral and Color            Flow Doppler were utilized during procedure). Indications:    Pulmonary Edema  History:        Patient has prior history of Echocardiogram examinations, most                 recent 03/30/2022. Risk Factors:Hypertension, Diabetes and                 Dyslipidemia.  Sonographer:    Aida Pizza RCS Referring Phys: [phone] EJIROGHENE E EMOKPAE  Sonographer Comments: Waiting for sugery on hip IMPRESSIONS  1. Left ventricular ejection fraction, by estimation, is 55 to 60%. The left ventricle has normal function. The left ventricle has no regional wall motion abnormalities. There is severe asymmetric left ventricular hypertrophy  of the septal segment. Left  ventricular diastolic parameters are consistent with Grade I diastolic dysfunction (impaired relaxation). Elevated left ventricular end-diastolic pressure.  2. Right ventricular systolic function is normal. The right ventricular size is normal. Moderately increased right ventricular wall thickness. Tricuspid regurgitation signal is inadequate for assessing PA pressure.  3. The mitral valve is abnormal. Mild mitral valve regurgitation. No evidence of mitral stenosis. The mean mitral valve gradient is 3.0 mmHg. Severe mitral annular calcification.  4. The aortic valve was not well visualized. Aortic valve regurgitation is not visualized. No aortic stenosis is present.  5. The inferior vena cava is normal in size with greater than 50% respiratory variability, suggesting right atrial pressure of 3 mmHg. Comparison(s): No significant change from prior study. FINDINGS  Left Ventricle: Left ventricular ejection fraction, by estimation, is 55 to 60%. The left ventricle has normal function. The left ventricle has no regional wall  motion abnormalities. Strain was performed and the global longitudinal strain is indeterminate. The left ventricular internal cavity size was normal in size. There is severe asymmetric left ventricular hypertrophy of the septal segment. Left ventricular diastolic parameters are consistent with Grade I diastolic dysfunction (impaired relaxation). Elevated left ventricular end-diastolic pressure. Right Ventricle: The right ventricular size is normal. Moderately increased right ventricular wall thickness. Right ventricular systolic function is normal. Tricuspid regurgitation signal is inadequate for assessing PA pressure. Left Atrium: Left atrial size was normal in size. Right Atrium: Right atrial size was normal in size. Pericardium: There is no evidence of pericardial effusion. Mitral Valve: The mitral valve is abnormal. Severe mitral annular calcification. Mild mitral valve regurgitation. No evidence of mitral valve stenosis. MV peak gradient, 14.0 mmHg. The mean mitral valve gradient is 3.0 mmHg. Tricuspid Valve: The tricuspid valve is normal in structure. Tricuspid valve regurgitation is not demonstrated. No evidence of tricuspid stenosis. Aortic Valve: The aortic valve was not well visualized. Aortic valve regurgitation is not visualized. No aortic stenosis is present. Pulmonic Valve: The pulmonic valve was not well visualized. Pulmonic valve regurgitation is not visualized. No evidence of pulmonic stenosis. Aorta: The aortic root is normal in size and structure. Venous: The inferior vena cava is normal in size with greater than 50% respiratory variability, suggesting right atrial pressure of 3 mmHg. IAS/Shunts: No atrial level shunt detected by color flow Doppler. Additional Comments: 3D was performed not requiring image post processing on an independent workstation and was indeterminate.  LEFT VENTRICLE PLAX 2D LVIDd:         4.30 cm   Diastology LVIDs:         3.00 cm   LV e' medial:    4.97 cm/s LV PW:          1.20 cm   LV E/e' medial:  21.5 LV IVS:        1.90 cm   LV e' lateral:   6.52 cm/s LVOT diam:     1.60 cm   LV E/e' lateral: 16.4 LV SV:         37 LV SV Index:   21 LVOT Area:     2.01 cm  RIGHT VENTRICLE RV S prime:     11.30 cm/s TAPSE (M-mode): 1.9 cm LEFT ATRIUM             Index        RIGHT ATRIUM           Index LA diam:        3.80 cm 2.14 cm/m  RA Area:     11.00 cm LA Vol (A2C):   64.4 ml 36.19 ml/m  RA Volume:   23.40 ml  13.15 ml/m LA Vol (A4C):   71.7 ml 40.30 ml/m LA Biplane Vol: 70.4 ml 39.57 ml/m  AORTIC VALVE LVOT Vmax:   91.00 cm/s LVOT Vmean:  61.100 cm/s LVOT VTI:    0.185 m  AORTA Ao Root diam: 3.20 cm MITRAL VALVE MV Area (PHT): 3.53 cm     SHUNTS MV Area VTI:   0.96 cm     Systemic VTI:  0.18 m MV Peak grad:  14.0 mmHg    Systemic Diam: 1.60 cm MV Mean grad:  3.0 mmHg MV Vmax:       1.87 m/s MV Vmean:      72.5 cm/s MV Decel Time: 215 msec MR Peak grad: 66.9 mmHg MR Vmax:      409.00 cm/s MV E velocity: 107.00 cm/s MV A velocity: 169.00 cm/s MV E/A ratio:  0.63 Vishnu Priya Mallipeddi Electronically signed by Diannah Late Mallipeddi Signature Date/Time: 01/06/2024/3:43:23 PM    Final    CT Angio Chest Aorta W and/or Wo Contrast Result Date: 01/05/2024 CLINICAL DATA:  Fall, abnormal chest x-ray. EXAM: CT ANGIOGRAPHY CHEST WITH CONTRAST TECHNIQUE: Multidetector CT imaging of the chest was performed using the standard protocol during bolus administration of intravenous contrast. Multiplanar CT image reconstructions and MIPs were obtained to evaluate the vascular anatomy. RADIATION DOSE REDUCTION: This exam was performed according to the departmental dose-optimization program which includes automated exposure control, adjustment of the mA and/or kV according to patient size and/or use of iterative reconstruction technique. CONTRAST:  75mL OMNIPAQUE  IOHEXOL  350 MG/ML SOLN COMPARISON:  01/05/2024, 12/23/2012, 08/24/2011. FINDINGS: Cardiovascular: The heart is enlarged and there is no  pericardial effusion. Scattered coronary artery calcifications are present. There is atherosclerotic calcification the aorta a without evidence of aneurysm. The pulmonary trunk is distended suggesting underlying pulmonary artery hypertension. No evidence of pulmonary embolism is seen. Mediastinum/Nodes: No mediastinal, hilar, or axillary lymphadenopathy is seen. There is a complex nodule in the right lobe of the thyroid  gland measuring 1.5 cm. The trachea and esophagus are within normal limits. Lungs/Pleura: Scattered ground-glass attenuation is noted in the lungs bilaterally. No effusion or pneumothorax. There is a 3 mm nodule in the right upper lobe, axial image 44, unchanged from 2014 and likely benign. No new nodule or mass is seen. Upper Abdomen: Stones are present within the gallbladder. A stable which shaped hypodensity is noted in the anterior right lobe of the liver, unchanged from 2013. Musculoskeletal: Degenerative changes are present in the thoracic spine. No acute osseous abnormality. Review of the MIP images confirms the above findings. IMPRESSION: 1. No evidence of pulmonary embolism. 2. Scattered ground-glass attenuation in the lungs bilaterally, possible edema or pneumonitis. 3. Distended pulmonary trunk suggesting underlying pulmonary artery hypertension. 4. Indeterminate right thyroid  nodule measuring 1.5 cm. Nonemergent ultrasound is recommended for further characterization. 5. Cholelithiasis. 6. Aortic atherosclerosis and coronary artery calcifications Electronically Signed   By: Leita Birmingham M.D.   On: 01/05/2024 18:09   CT Hip Left Wo Contrast Result Date: 01/05/2024 EXAM: CT OF THE LEFT HIP WITHOUT IV CONTRAST 01/05/2024 05:36:48 PM TECHNIQUE: CT of the left hip was performed without the administration of intravenous contrast. Multiplanar reformatted images are provided for review. Automated exposure control, iterative reconstruction, and/or weight based adjustment of the mA/kV was  utilized to reduce the radiation dose to as low as reasonably achievable. COMPARISON: Hip  radiographs dated 01/05/2024. CLINICAL HISTORY: Hip trauma, fracture suspected, xray done. FINDINGS: BONES/JOINT:: Intertrochanteric fracture of the left proximal femur with fracture margins extending through the greater and lesser trochanters. There is lateral displacement anteriorly measuring up to 7 mm. The superior fracture margin extends to the level of the suture anchors within the greater trochanter. No dislocation. Mild osteoarthritis of the left hip. SOFT TISSUE: Soft tissue and intramuscular edema surrounding the left femoral intertrochanteric fracture. Small fat-containing left inguinal hernia. Arterial vascular calcifications. IMPRESSION: 1. Intertrochanteric fracture of the left proximal femur with lateral displacement anteriorly measuring up to 7 mm. The superior fracture margin extends to the level of the suture anchors within the greater trochanter. 2. Mild osteoarthritis of the left hip. Electronically signed by: Harrietta Sherry MD 01/05/2024 05:46 PM EST RP Workstation: [id number]   DG Chest Port 1 View Result Date: 01/05/2024 EXAM: 1 VIEW(S) XRAY OF THE CHEST 01/05/2024 04:54:00 PM COMPARISON: 07/17/2023 CLINICAL HISTORY: fall, diffuse pain FINDINGS: LUNGS AND PLEURA: Elevated right hemidiaphragm. No focal pulmonary opacity. No pulmonary edema. No pleural effusion. No pneumothorax. HEART AND MEDIASTINUM: Aortic atherosclerosis. There is new widening of the mediastinum compared to 07/17/2023 and new rightward deviation of the trachea. CTA of the chest is recommended to evaluate for vascular injury or adenopathy. No acute abnormality of the cardiac silhouette. BONES AND SOFT TISSUES: No acute osseous abnormality. IMPRESSION: 1. New widening of the mediastinum and new rightward deviation of the trachea compared to 07/17/2023. CTA of the chest is recommended to evaluate for vascular injury or adenopathy. 2.  Elevated right hemidiaphragm. Electronically signed by: Norman Gatlin MD 01/05/2024 05:03 PM EST RP Workstation: [id number]   DG Hip Unilat W or Wo Pelvis 2-3 Views Left Result Date: 01/05/2024 CLINICAL DATA:  Fall, diffuse pain.  Left hip pain. EXAM: DG HIP (WITH OR WITHOUT PELVIS) 2-3V LEFT COMPARISON:  01/16/2018. FINDINGS: A lucency is noted in the inter trochanteric region on the left, suspicious for nondisplaced fracture. Suture anchors are present at the greater trochanter. No dislocation is seen. Mild degenerative changes are noted at the hips. There are degenerative changes at the sacroiliac joints and lower lumbar spine. Vascular calcifications are present in the bilateral lower extremities. IMPRESSION: 1. Lucency in the intratrochanteric region on the left suggesting nondisplaced fracture. 2. Surgical changes at the greater trochanter on the left. Electronically Signed   By: Leita Birmingham M.D.   On: 01/05/2024 17:01   CT Head Wo Contrast Result Date: 01/05/2024 EXAM: CT HEAD WITHOUT CONTRAST 01/05/2024 04:40:10 PM TECHNIQUE: CT of the head was performed without the administration of intravenous contrast. Automated exposure control, iterative reconstruction, and/or weight based adjustment of the mA/kV was utilized to reduce the radiation dose to as low as reasonably achievable. COMPARISON: MRI brain 09/05/2020. CLINICAL HISTORY: near syncope, fall FINDINGS: BRAIN AND VENTRICLES: No acute hemorrhage. No evidence of acute infarct. Patchy periventricular and deep white matter low-attenuation, likely chronic microvascular ischemic changes. Prominent ventricles and sulci reflecting age-related volume loss. No extra-axial collection. No mass effect or midline shift. Intracranial vascular calcifications. ORBITS: Bilateral lens replacements noted. SINUSES: No acute abnormality. SOFT TISSUES AND SKULL: No acute soft tissue abnormality. No skull fracture. IMPRESSION: 1. No acute intracranial abnormality. 2.  Moderate chronic microvascular ischemia. 3. Age-related cerebral volume loss. Electronically signed by: Donnice Mania MD 01/05/2024 04:53 PM EST RP Workstation: [id number]    Results are pending, will review when available.  Previous records (including but not limited to H&P, progress notes, nursing notes, TOC management) were  reviewed in assessment of this patient.  Labs: CBC: Recent Labs  Lab 01/05/24 1631 01/06/24 0500 01/07/24 0446  WBC 9.4 11.6* 12.7*  NEUTROABS 5.9  --   --   HGB 13.9 12.8 12.8  HCT 43.1 39.6 40.0  MCV 87.8 88.2 88.9  PLT 224 191 160   Basic Metabolic Panel: Recent Labs  Lab 01/05/24 1631 01/06/24 0500 01/07/24 0446  NA 139 136 136  K 3.9 4.1 4.6  CL 103 100 99  CO2 24 27 25   GLUCOSE 111* 118* 147*  BUN 18 15 18   CREATININE 0.80 0.76 0.84  CALCIUM 8.8* 8.7* 8.6*   Liver Function Tests: Recent Labs  Lab 01/05/24 1631  AST 26  ALT 15  ALKPHOS 70  BILITOT 0.4  PROT 7.1  ALBUMIN 4.2   CBG: Recent Labs  Lab 01/05/24 1612 01/06/24 1537  GLUCAP 125* 101*    Scheduled Meds:  fluticasone   2 spray Each Nare Daily   metoprolol  succinate  25 mg Oral Daily   mupirocin ointment  1 Application Nasal BID   ranolazine   500 mg Oral BID   Continuous Infusions: PRN Meds:.acetaminophen  **OR** acetaminophen , morphine  injection, ondansetron  **OR** ondansetron  (ZOFRAN ) IV, mouth rinse, polyethylene glycol  Family Communication: At bedside  Disposition: Status is: Inpatient Remains inpatient appropriate because: Left hip fracture, hypoxia     Time spent: 35 minutes  Length of inpatient stay: 3 days  Author: Carliss LELON Canales, DO 01/08/2024 1:57 PM  For on call review [email or web address].

## 2024-01-08 NOTE — Progress Notes (Signed)
 Mobility Specialist Progress Note:    01/08/24 1000  Mobility  Activity Pivoted/transferred from bed to chair  Level of Assistance Moderate assist, patient does 50-74%  Assistive Device Front wheel walker  Distance Ambulated (ft) 3 ft  Range of Motion/Exercises Active;All extremities  LLE Weight Bearing Per Provider Order WBAT  Activity Response Tolerated well  Mobility Referral Yes  Mobility visit 1 Mobility  Mobility Specialist Start Time (ACUTE ONLY) 1000  Mobility Specialist Stop Time (ACUTE ONLY) 1020  Mobility Specialist Time Calculation (min) (ACUTE ONLY) 20 min   Pt received in bed, requesting assistance to chair. NT and family in room. Required ModA to stand and transfer with RW. Tolerated well, all needs met.  Mady Oubre Mobility Specialist Please contact via Special Educational Needs Teacher or  Rehab office at [phone]

## 2024-01-08 NOTE — Plan of Care (Signed)

## 2024-01-09 DIAGNOSIS — Y92008 Other place in unspecified non-institutional (private) residence as the place of occurrence of the external cause: Secondary | ICD-10-CM | POA: Diagnosis not present

## 2024-01-09 DIAGNOSIS — Z4789 Encounter for other orthopedic aftercare: Secondary | ICD-10-CM | POA: Diagnosis not present

## 2024-01-09 DIAGNOSIS — J9601 Acute respiratory failure with hypoxia: Secondary | ICD-10-CM | POA: Diagnosis not present

## 2024-01-09 DIAGNOSIS — E119 Type 2 diabetes mellitus without complications: Secondary | ICD-10-CM | POA: Diagnosis not present

## 2024-01-09 DIAGNOSIS — I200 Unstable angina: Secondary | ICD-10-CM | POA: Diagnosis not present

## 2024-01-09 DIAGNOSIS — M810 Age-related osteoporosis without current pathological fracture: Secondary | ICD-10-CM | POA: Diagnosis not present

## 2024-01-09 DIAGNOSIS — J309 Allergic rhinitis, unspecified: Secondary | ICD-10-CM | POA: Diagnosis not present

## 2024-01-09 DIAGNOSIS — R1310 Dysphagia, unspecified: Secondary | ICD-10-CM | POA: Diagnosis not present

## 2024-01-09 DIAGNOSIS — R262 Difficulty in walking, not elsewhere classified: Secondary | ICD-10-CM | POA: Diagnosis not present

## 2024-01-09 DIAGNOSIS — S72002A Fracture of unspecified part of neck of left femur, initial encounter for closed fracture: Secondary | ICD-10-CM | POA: Diagnosis not present

## 2024-01-09 DIAGNOSIS — S72002D Fracture of unspecified part of neck of left femur, subsequent encounter for closed fracture with routine healing: Secondary | ICD-10-CM | POA: Diagnosis not present

## 2024-01-09 DIAGNOSIS — R2681 Unsteadiness on feet: Secondary | ICD-10-CM | POA: Diagnosis not present

## 2024-01-09 DIAGNOSIS — S72002G Fracture of unspecified part of neck of left femur, subsequent encounter for closed fracture with delayed healing: Secondary | ICD-10-CM | POA: Diagnosis not present

## 2024-01-09 DIAGNOSIS — S72142D Displaced intertrochanteric fracture of left femur, subsequent encounter for closed fracture with routine healing: Secondary | ICD-10-CM | POA: Diagnosis not present

## 2024-01-09 DIAGNOSIS — Z23 Encounter for immunization: Secondary | ICD-10-CM | POA: Diagnosis present

## 2024-01-09 DIAGNOSIS — E1169 Type 2 diabetes mellitus with other specified complication: Secondary | ICD-10-CM | POA: Diagnosis not present

## 2024-01-09 DIAGNOSIS — Z9181 History of falling: Secondary | ICD-10-CM | POA: Diagnosis not present

## 2024-01-09 DIAGNOSIS — I10 Essential (primary) hypertension: Secondary | ICD-10-CM | POA: Diagnosis not present

## 2024-01-09 DIAGNOSIS — R41841 Cognitive communication deficit: Secondary | ICD-10-CM | POA: Diagnosis not present

## 2024-01-09 DIAGNOSIS — R9389 Abnormal findings on diagnostic imaging of other specified body structures: Secondary | ICD-10-CM | POA: Diagnosis not present

## 2024-01-09 DIAGNOSIS — E041 Nontoxic single thyroid nodule: Secondary | ICD-10-CM | POA: Diagnosis not present

## 2024-01-09 DIAGNOSIS — W1839XA Other fall on same level, initial encounter: Secondary | ICD-10-CM | POA: Diagnosis not present

## 2024-01-09 DIAGNOSIS — M6281 Muscle weakness (generalized): Secondary | ICD-10-CM | POA: Diagnosis not present

## 2024-01-09 DIAGNOSIS — Z9861 Coronary angioplasty status: Secondary | ICD-10-CM | POA: Diagnosis not present

## 2024-01-09 LAB — BASIC METABOLIC PANEL WITH GFR
Anion gap: 8 (ref 5–15)
BUN: 19 mg/dL (ref 8–23)
CO2: 29 mmol/L (ref 22–32)
Calcium: 8.6 mg/dL — ABNORMAL LOW (ref 8.9–10.3)
Chloride: 99 mmol/L (ref 98–111)
Creatinine, Ser: 0.78 mg/dL (ref 0.44–1.00)
GFR, Estimated: >60 mL/min (ref >60)
Glucose, Bld: 114 mg/dL — ABNORMAL HIGH (ref 70–99)
Potassium: 4.5 mmol/L (ref 3.5–5.1)
Sodium: 135 mmol/L (ref 135–145)

## 2024-01-09 LAB — CBC
HCT: 37.7 % (ref 36.0–46.0)
Hemoglobin: 12.3 g/dL (ref 12.0–15.0)
MCH: 28.8 pg (ref 26.0–34.0)
MCHC: 32.6 g/dL (ref 30.0–36.0)
MCV: 88.3 fL (ref 80.0–100.0)
Platelets: 156 K/uL (ref 150–400)
RBC: 4.27 MIL/uL (ref 3.87–5.11)
RDW: 13.3 % (ref 11.5–15.5)
WBC: 10.9 K/uL — ABNORMAL HIGH (ref 4.0–10.5)
nRBC: 0 % (ref 0.0–0.2)

## 2024-01-09 LAB — MAGNESIUM: Magnesium: 2.4 mg/dL (ref 1.7–2.4)

## 2024-01-09 MED ORDER — ONDANSETRON HCL 4 MG PO TABS: 4.0000 mg | ORAL_TABLET | Freq: Four times a day (QID) | ORAL | Status: DC | PRN

## 2024-01-09 MED ORDER — OXYCODONE HCL 5 MG PO TABS: 5.0000 mg | ORAL_TABLET | Freq: Three times a day (TID) | ORAL | 0 refills | Status: DC | PRN

## 2024-01-09 MED ORDER — ALBUTEROL SULFATE (2.5 MG/3ML) 0.083% IN NEBU: 2.5000 mg | INHALATION_SOLUTION | Freq: Four times a day (QID) | RESPIRATORY_TRACT | Status: DC | PRN

## 2024-01-09 NOTE — Plan of Care (Signed)

## 2024-01-09 NOTE — TOC Transition Note (Signed)
 Transition of Care [facility]) - Discharge Note   Patient Details  Name: Lauren House MRN: [id number] Date of Birth: 10-12-1935  Transition of Care [facility]) CM/SW Contact:  Mcarthur Saddie Kim, LCSW Phone Number: 01/09/2024, 11:51 AM   Clinical Narrative:  Pt d/c today to [facility]. Pt, daughter, and facility aware and agreeable. SNF authorization received. Pt will transfer with staff. RN given number to call report. D/C summary sent to SNF.      Final next level of care: Skilled Nursing Facility Barriers to Discharge: Barriers Resolved   Patient Goals and CMS Choice Patient states their goals for this hospitalization and ongoing recovery are:: return back home CMS Medicare.gov Compare Post Acute Care list provided to:: Patient Represenative (must comment) Choice offered to / list presented to : Patient, Adult Children      Discharge Placement PASRR number recieved: 01/09/24            Patient chooses bed at: [facility] Patient to be transferred to facility by: staff Name of family member notified: daughter, Easter Patient and family notified of of transfer: 01/09/24  Discharge Plan and Services Additional resources added to the After Visit Summary for       Post Acute Care Choice: Durable Medical Equipment                               Social Drivers of Health (SDOH) Interventions SDOH Screenings   Food Insecurity: No Food Insecurity (01/05/2024)  Housing: Low Risk  (01/05/2024)  Transportation Needs: No Transportation Needs (01/05/2024)  Utilities: Not At Risk (01/05/2024)  Alcohol Screen: Low Risk  (06/06/2023)  Depression (PHQ2-9): Low Risk  (10/22/2023)  Financial Resource Strain: Low Risk  (06/06/2023)  Physical Activity: Insufficiently Active (06/06/2023)  Social Connections: Moderately Isolated (01/05/2024)  Stress: No Stress Concern Present (06/06/2023)  Tobacco Use: Low Risk  (01/06/2024)  Health Literacy: Adequate Health Literacy (06/06/2023)      Readmission Risk Interventions    01/07/2024    2:21 PM 01/06/2024   11:49 AM  Readmission Risk Prevention Plan  Medication Screening Complete Complete  Transportation Screening Complete Complete

## 2024-01-09 NOTE — Evaluation (Signed)
 Physical Therapy Evaluation Patient Details Name: Lauren House MRN: [id number] DOB: 28-Mar-1935 Today's Date: 01/09/2024  History of Present Illness  Lauren House is a 88 y.o. female with medical history significant for coronary artery disease, diabetes mellitus, hypertension.  Patient was brought to the ED with reports of a fall.  Patient was sitting taking a nap, she got up felt lightheaded and fell.  She has history of vertigo but not dizziness like today.  She denies prior episodes of dizziness.  She reports an episode recently, but her blood sugars were low at that time.  She reports good oral intake, no vomiting no diarrhea.  Per daughter, patient is very active at baseline.  No chest pain no difficulty breathing no cough.    Clinical Impression  Pt presented w/ Left closed hip Fx ;WBAT w/ general weakness in LE. Pt tolerated session well on room air initially, and seemed motivated to ambulate. Pt was able to perform bed mobility w/ Min A/Mod A to EOB w/ the use of UE. Pt was able to perform warm-up tasks w/ good seating balance at EOB. Pt. Was able to transfer from bed to chair and ambulated w/ CGA/ Min A w/ RW. Pt. Was able to ambulate to the bathroom, and wash hands at sink. Towards the end of treatment SpO2 declined to 85%.Pt. was put back on [place] 2L. Pt. Was left in chair w/ daughter and call bell. Nursing staff was notified on pt status. Patient will benefit from continued skilled physical therapy in hospital and recommended venue below to increase strength, balance, endurance for safe ADLs and gait.       If plan is discharge home, recommend the following: A little help with walking and/or transfers;Assistance with cooking/housework;A little help with bathing/dressing/bathroom;Help with stairs or ramp for entrance;Assist for transportation   Can travel by private vehicle   No    Equipment Recommendations None recommended by PT  Recommendations for Other Services        Functional Status Assessment Patient has had a recent decline in their functional status and demonstrates the ability to make significant improvements in function in a reasonable and predictable amount of time.     Precautions / Restrictions Precautions Precautions: Fall Recall of Precautions/Restrictions: Intact Restrictions Weight Bearing Restrictions Per Provider Order: Yes LLE Weight Bearing Per Provider Order: Weight bearing as tolerated      Mobility  Bed Mobility Overal bed mobility: Needs Assistance Bed Mobility: Supine to Sit     Supine to sit: Min assist, Mod assist     General bed mobility comments: assistance w/ LE to EOB    Transfers Overall transfer level: Needs assistance Equipment used: Rolling walker (2 wheels) Transfers: Sit to/from Stand, Bed to chair/wheelchair/BSC Sit to Stand: Min assist, Contact guard assist   Step pivot transfers: Min assist, Contact guard assist       General transfer comment: Initially, was labored during transfer from sit to stand w/ RW, gradually gained strength    Ambulation/Gait Ambulation/Gait assistance: Min assist, Contact guard assist Gait Distance (Feet): 17 Feet Assistive device: Rolling walker (2 wheels) Gait Pattern/deviations: Step-to pattern, Decreased step length - right, Decreased step length - left, Decreased stance time - right, Decreased stance time - left, Decreased stride length, Trunk flexed       General Gait Details: pt was able to ambulate to bathroom, and wash hands. pt was able to ambulate back to chair on room air.  Stairs  Wheelchair Mobility     Tilt Bed    Modified Rankin (Stroke Patients Only)       Balance Overall balance assessment: Needs assistance Sitting-balance support: Bilateral upper extremity supported, Feet supported Sitting balance-Leahy Scale: Fair Sitting balance - Comments: Fair/good  at EOB   Standing balance support: During functional activity,  Reliant on assistive device for balance, Bilateral upper extremity supported Standing balance-Leahy Scale: Fair Standing balance comment: fair w/ RW                             Pertinent Vitals/Pain Pain Assessment Pain Assessment: No/denies pain    Home Living Family/patient expects to be discharged to:: Private residence Living Arrangements: Alone Available Help at Discharge: Family;Friend(s) Type of Home: House Home Access: Stairs to enter Entrance Stairs-Rails: None Entrance Stairs-Number of Steps: 1   Home Layout: One level Home Equipment: Agricultural Consultant (2 wheels);Cane - quad;Cane - single point;Grab bars - tub/shower      Prior Function Prior Level of Function : Independent/Modified Independent;Driving             Mobility Comments: Tourist information centre manager without AD; drives ADLs Comments: Independent ADL's; daughter assist with transportation to grocery store. Pt able to cook and clean.     Extremity/Trunk Assessment        Lower Extremity Assessment Lower Extremity Assessment: Generalized weakness    Cervical / Trunk Assessment Cervical / Trunk Assessment: Normal  Communication   Communication Communication: No apparent difficulties    Cognition Arousal: Alert Behavior During Therapy: WFL for tasks assessed/performed   PT - Cognitive impairments: No apparent impairments                         Following commands: Intact       Cueing Cueing Techniques: Verbal cues, Tactile cues     General Comments General comments (skin integrity, edema, etc.): Pt. was on room air on treatment SpO2 93% towards the end of treatment when pt returned to chair pt SpO2 declined to 85%. pt was put back on [place] 2L and SpO2 increased to 95%    Exercises General Exercises - Lower Extremity Ankle Circles/Pumps: Seated, AROM, Both, 5 reps Long Arc Quad: Seated, AROM, Strengthening, Both, 10 reps Hip Flexion/Marching: Seated, AROM, Strengthening, Both,  10 reps Toe Raises: Seated, AROM, Strengthening, Both, 10 reps Heel Raises: Seated, AROM, Strengthening, Both, 10 reps   Assessment/Plan    PT Assessment Patient needs continued PT services  PT Problem List Decreased strength;Decreased range of motion;Decreased activity tolerance;Decreased balance;Decreased mobility;Decreased coordination;Decreased safety awareness       PT Treatment Interventions DME instruction;Gait training;Stair training;Patient/family education;Functional mobility training;Therapeutic activities;Therapeutic exercise;Balance training    PT Goals (Current goals can be found in the Care Plan section)  Acute Rehab PT Goals Patient Stated Goal: Pt. wants to attend SNF to regain strength to return home. PT Goal Formulation: With patient/family Time For Goal Achievement: 01/18/24 Potential to Achieve Goals: Good    Frequency Min 3X/week     Co-evaluation               AM-PAC PT 6 Clicks Mobility  Outcome Measure Help needed turning from your back to your side while in a flat bed without using bedrails?: A Little Help needed moving from lying on your back to sitting on the side of a flat bed without using bedrails?: A Little Help needed moving to and from  a bed to a chair (including a wheelchair)?: A Little Help needed standing up from a chair using your arms (e.g., wheelchair or bedside chair)?: A Lot Help needed to walk in hospital room?: A Lot Help needed climbing 3-5 steps with a railing? : A Lot 6 Click Score: 15    End of Session Equipment Utilized During Treatment: Gait belt;Oxygen Activity Tolerance: Patient tolerated treatment well;Patient limited by fatigue Patient left: in chair;with family/visitor present;with call bell/phone within reach Nurse Communication: Mobility status PT Visit Diagnosis: Other abnormalities of gait and mobility (R26.89);Muscle weakness (generalized) (M62.81);History of falling (Z91.81)    Time: 9064-8994 PT Time  Calculation (min) (ACUTE ONLY): 30 min   Charges:     PT Treatments $Therapeutic Exercise: 8-22 mins $Therapeutic Activity: 8-22 mins PT General Charges $$ ACUTE PT VISIT: 1 Visit        Quida Glasser, SPT

## 2024-01-09 NOTE — Discharge Summary (Signed)
 Physician Discharge Summary   Patient: Lauren House MRN: [id number] DOB: 03/24/1935  Admit date:     01/05/2024  Discharge date: 01/09/24  Discharge Physician: Carliss LELON Canales   PCP: Duanne Butler DASEN, MD   Recommendations at discharge:    Pt to be discharged to SNF.   If you experience worsening fever, chills, chest pain, shortness of breath, or other concerning symptoms, please call your PCP or go to the emergency department immediately.  Discharge Diagnoses: Principal Problem:   Closed left hip fracture (HCC) Active Problems:   Acute hypoxic respiratory failure (HCC)   Thyroid  nodule   Essential hypertension   Diabetes mellitus (HCC)  Resolved Problems:   * No resolved hospital problems. [facility] Course:  88 y.o. female with a history of CAD, T2DM, HTN who presented to the ED on 01/05/2024 after a fall at home where she stood up after a nap and felt lightheaded. She had left hip pain and was noted to be hypoxemic to 89% in the ED. Workup additionally revealing an acute displaced intertrochanteric left femur fracture for which orthopedics was consulted and the patient was admitted.     Assessment and Plan:   Acute closed left intertrochanteric femur fracture - S/p IM nail 11/10 by orthopedic surgery.  WBAT LLE, aspirin  81 mg twice daily.  Per orthopedics, follow-up in approximately 10-14 days.  There are no sutures that need to be removed, sutures can be trimmed approximately 2 weeks after surgery.  Prescription provided for oxycodone  5 mg tablets to take as directed.   Acute hypoxic respiratory failure - Etiology unclear, differentials include pneumonitis, CAP, edema.  CT chest noting bilateral ground glass.  No consolidation, fever, proBNP mildly elevated.  Responded well to Lasix x 1.  Adding off on antibiotics given patient's multiple allergies.  Showing improvement.  Weaned down to 2 L nasal cannula.  Echo showing preserved EF, stable LVH.  Continue to wean O2 as  tolerated.     Ground level fall - Etiology likely combined mild hypoxia with orthostatic hypotension/volume depletion.  CT head negative.  PT OT postop, transitioning to STR.   Right thyroid  nodule - Incidental finding of 1.5 cm right thyroid  nodule on CTA chest.  TSH normal.  Thyroid  ultrasound recommended after discharge and PCP setting.   Diabetes mellitus - Resume previous home regiment.   Goals of care - Working closely with family, TOC on transition to SNF.    Consultants: Orthopedic surgery Procedures performed: Intramedullary nail 11/10 Disposition: Skilled nursing facility Diet recommendation:  Discharge Diet Orders (From admission, onward)     Start     Ordered   01/09/24 0000  Diet - low sodium heart healthy        01/09/24 1144           Cardiac and Carb modified diet  DISCHARGE MEDICATION: Allergies as of 01/09/2024       Reactions   Aspirin  Other (See Comments)   Nose bleeds with full strength   Atorvastatin Other (See Comments)   Muscle pain   Atorvastatin Calcium Other (See Comments)   Unknown    Bactrim [sulfamethoxazole -trimethoprim] Other (See Comments)   Doesn't remember    Chlorhexidine  Itching   Clarithromycin Other (See Comments)   Doesn't remember   Colestipol Hcl Other (See Comments)   Unknown    Ezetimibe Other (See Comments)   GI upset   Penicillamine Other (See Comments)   Unknown    Prednisone  Other (See Comments)  Unknown    Simvastatin Other (See Comments)   Muscle spasms   Vancomycin  Hives   Cephalosporins Hives, Itching   Ciprofloxacin Nausea Only   Penicillins Rash   Immediate rash, facial/tongue/throat swelling, SOB or lightheadedness with hypotension   Tape Rash, Other (See Comments)        Medication List     STOP taking these medications    Butalbital -APAP-Caffeine  50-300-40 MG Caps       TAKE these medications    albuterol (2.5 MG/3ML) 0.083% nebulizer solution Commonly known as: PROVENTIL Take 3  mLs (2.5 mg total) by nebulization every 6 (six) hours as needed for wheezing or shortness of breath.   aspirin  EC 81 MG tablet Take 81 mg by mouth daily.   fluticasone  50 MCG/ACT nasal spray Commonly known as: FLONASE  USE TWO SPRAY IN EACH NOSTRIL EVERY DAY   methocarbamol  750 MG tablet Commonly known as: Robaxin -750 Take 1 tablet (750 mg total) by mouth at bedtime as needed for muscle spasms.   metoprolol  succinate 25 MG 24 hr tablet Commonly known as: Toprol  XL Take 1 tablet (25 mg total) by mouth daily.   nitroGLYCERIN  0.4 MG SL tablet Commonly known as: NITROSTAT  Place 1 tablet (0.4 mg total) under the tongue every 5 (five) minutes as needed for chest pain.   OCUVITE PO Take 1 tablet by mouth daily.   ondansetron  4 MG tablet Commonly known as: ZOFRAN  Take 1 tablet (4 mg total) by mouth every 6 (six) hours as needed for nausea.   oxyCODONE  5 MG immediate release tablet Commonly known as: Roxicodone  Take 1 tablet (5 mg total) by mouth every 8 (eight) hours as needed.   ranolazine  500 MG 12 hr tablet Commonly known as: RANEXA  Take 1 tablet (500 mg total) by mouth 2 (two) times daily.   senna-docusate 8.6-50 MG tablet Commonly known as: Senokot-S Take 1 tablet by mouth at bedtime as needed for mild constipation or moderate constipation.   SYSTANE OP Apply 1 drop to eye 3 (three) times daily as needed (Dry eyes).   VITAMIN B12 PO Take 1 tablet by mouth daily.               Discharge Care Instructions  (From admission, onward)           Start     Ordered   01/09/24 0000  Discharge wound care:       Comments: Per orthopedics, follow-up in approximately 10-14 days.  There are no sutures that need to be removed, sutures can be trimmed approximately 2 weeks after surgery.   01/09/24 1144             Discharge Exam: Filed Weights   01/05/24 1612 01/06/24 1500  Weight: 67.1 kg 67.1 kg    GENERAL:  Alert, pleasant, no acute distress,  frail HEENT:  EOMI, nasal cannula CARDIOVASCULAR:  RRR, no murmurs appreciated RESPIRATORY:  Clear to auscultation, no wheezing, rales, or rhonchi GASTROINTESTINAL:  Soft, nontender, nondistended EXTREMITIES:  No LE edema bilaterally NEURO:  No new focal deficits appreciated SKIN: Surgical bandage over left hip PSYCH:  Appropriate mood and affect    Condition at discharge: improving  The results of significant diagnostics from this hospitalization (including imaging, microbiology, ancillary and laboratory) are listed below for reference.   Imaging Studies: DG CHEST PORT 1 VIEW Result Date: 01/07/2024 CLINICAL DATA:  Acute respiratory failure with hypoxia. EXAM: PORTABLE CHEST 1 VIEW COMPARISON:  Radiographs 01/05/2024 and 07/17/2023.  CT 01/05/2024. FINDINGS: 0807 hours.  Stable elevation of the right hemidiaphragm with mild right basilar atelectasis. The lungs are otherwise clear. The heart size and mediastinal contours are stable with aortic and coronary artery atherosclerosis. No pleural effusion or pneumothorax. No acute osseous findings are evident. IMPRESSION: No evidence of acute cardiopulmonary process. Stable elevation of the right hemidiaphragm with mild right basilar atelectasis. Electronically Signed   By: Elsie Perone M.D.   On: 01/07/2024 12:23   DG FEMUR MIN 2 VIEWS LEFT Result Date: 01/06/2024 CLINICAL DATA:  Intraoperative images depicting placement of proximal femoral IM nail system EXAM: LEFT FEMUR 2 VIEWS COMPARISON:  CT scan 01/05/2024 FINDINGS: Intraoperative fluoroscopic spot images demonstrate and left femoral head nail system in place with near anatomic alignment and no complicating feature observed. IMPRESSION: 1. Left femoral head nail system in place with near anatomic alignment and no complicating feature observed. Electronically Signed   By: Ryan Salvage M.D.   On: 01/06/2024 18:18   DG FEMUR MIN 2 VIEWS LEFT Result Date: 01/06/2024 CLINICAL DATA:  Left  proximal femur intertrochanteric fracture EXAM: LEFT FEMUR 2 VIEWS COMPARISON:  CT scan 11/03/2023 FINDINGS: Left proximal femoral IM nail system in place with long IM nail with transverse interlocking diaphyseal screw. No periprosthetic fracture or new acute bony findings. Expected postoperative findings including gas in the soft tissues. Medial compartmental articular space narrowing in the left knee. Bony demineralization. IMPRESSION: 1. Left proximal femoral IM nail system in place without complicating feature. 2. Medial compartmental articular space narrowing in the left knee. 3. Bony demineralization. Electronically Signed   By: Ryan Salvage M.D.   On: 01/06/2024 18:17   DG Pelvis Portable Result Date: 01/06/2024 CLINICAL DATA:  Left hip intertrochanteric fracture status post IM nail system fixation EXAM: PORTABLE PELVIS 1-2 VIEWS COMPARISON:  CT scan 01/05/2024 FINDINGS: Left proximal femoral IM nail system in place traversing the prior intertrochanteric fracture with near anatomic alignment. Contrast medium in the urinary bladder. Bony demineralization. Spurring of the right femoral head. IMPRESSION: 1. Left proximal femoral IM nail system in place traversing the prior intertrochanteric fracture with near anatomic alignment. Electronically Signed   By: Ryan Salvage M.D.   On: 01/06/2024 18:15   DG C-Arm 1-60 Min-No Report Result Date: 01/06/2024 Fluoroscopy was utilized by the requesting physician.  No radiographic interpretation.   ECHOCARDIOGRAM COMPLETE Result Date: 01/06/2024    ECHOCARDIOGRAM REPORT   Patient Name:   CATALINA SALASAR Date of Exam: 01/06/2024 Medical Rec #:  [id number]      Height:       67.0 in Accession #:    [id number]     Weight:       148.0 lb Date of Birth:  05-27-1935      BSA:          1.779 m Patient Age:    88 years       BP:           121/70 mmHg Patient Gender: F              HR:           67 bpm. Exam Location:  [facility] Procedure: 2D Echo, Cardiac  Doppler and Color Doppler (Both Spectral and Color            Flow Doppler were utilized during procedure). Indications:    Pulmonary Edema  History:        Patient has prior history of Echocardiogram examinations, most  recent 03/30/2022. Risk Factors:Hypertension, Diabetes and                 Dyslipidemia.  Sonographer:    Aida Pizza RCS Referring Phys: [phone] EJIROGHENE E EMOKPAE  Sonographer Comments: Waiting for sugery on hip IMPRESSIONS  1. Left ventricular ejection fraction, by estimation, is 55 to 60%. The left ventricle has normal function. The left ventricle has no regional wall motion abnormalities. There is severe asymmetric left ventricular hypertrophy of the septal segment. Left  ventricular diastolic parameters are consistent with Grade I diastolic dysfunction (impaired relaxation). Elevated left ventricular end-diastolic pressure.  2. Right ventricular systolic function is normal. The right ventricular size is normal. Moderately increased right ventricular wall thickness. Tricuspid regurgitation signal is inadequate for assessing PA pressure.  3. The mitral valve is abnormal. Mild mitral valve regurgitation. No evidence of mitral stenosis. The mean mitral valve gradient is 3.0 mmHg. Severe mitral annular calcification.  4. The aortic valve was not well visualized. Aortic valve regurgitation is not visualized. No aortic stenosis is present.  5. The inferior vena cava is normal in size with greater than 50% respiratory variability, suggesting right atrial pressure of 3 mmHg. Comparison(s): No significant change from prior study. FINDINGS  Left Ventricle: Left ventricular ejection fraction, by estimation, is 55 to 60%. The left ventricle has normal function. The left ventricle has no regional wall motion abnormalities. Strain was performed and the global longitudinal strain is indeterminate. The left ventricular internal cavity size was normal in size. There is severe asymmetric left  ventricular hypertrophy of the septal segment. Left ventricular diastolic parameters are consistent with Grade I diastolic dysfunction (impaired relaxation). Elevated left ventricular end-diastolic pressure. Right Ventricle: The right ventricular size is normal. Moderately increased right ventricular wall thickness. Right ventricular systolic function is normal. Tricuspid regurgitation signal is inadequate for assessing PA pressure. Left Atrium: Left atrial size was normal in size. Right Atrium: Right atrial size was normal in size. Pericardium: There is no evidence of pericardial effusion. Mitral Valve: The mitral valve is abnormal. Severe mitral annular calcification. Mild mitral valve regurgitation. No evidence of mitral valve stenosis. MV peak gradient, 14.0 mmHg. The mean mitral valve gradient is 3.0 mmHg. Tricuspid Valve: The tricuspid valve is normal in structure. Tricuspid valve regurgitation is not demonstrated. No evidence of tricuspid stenosis. Aortic Valve: The aortic valve was not well visualized. Aortic valve regurgitation is not visualized. No aortic stenosis is present. Pulmonic Valve: The pulmonic valve was not well visualized. Pulmonic valve regurgitation is not visualized. No evidence of pulmonic stenosis. Aorta: The aortic root is normal in size and structure. Venous: The inferior vena cava is normal in size with greater than 50% respiratory variability, suggesting right atrial pressure of 3 mmHg. IAS/Shunts: No atrial level shunt detected by color flow Doppler. Additional Comments: 3D was performed not requiring image post processing on an independent workstation and was indeterminate.  LEFT VENTRICLE PLAX 2D LVIDd:         4.30 cm   Diastology LVIDs:         3.00 cm   LV e' medial:    4.97 cm/s LV PW:         1.20 cm   LV E/e' medial:  21.5 LV IVS:        1.90 cm   LV e' lateral:   6.52 cm/s LVOT diam:     1.60 cm   LV E/e' lateral: 16.4 LV SV:  37 LV SV Index:   21 LVOT Area:     2.01  cm  RIGHT VENTRICLE RV S prime:     11.30 cm/s TAPSE (M-mode): 1.9 cm LEFT ATRIUM             Index        RIGHT ATRIUM           Index LA diam:        3.80 cm 2.14 cm/m   RA Area:     11.00 cm LA Vol (A2C):   64.4 ml 36.19 ml/m  RA Volume:   23.40 ml  13.15 ml/m LA Vol (A4C):   71.7 ml 40.30 ml/m LA Biplane Vol: 70.4 ml 39.57 ml/m  AORTIC VALVE LVOT Vmax:   91.00 cm/s LVOT Vmean:  61.100 cm/s LVOT VTI:    0.185 m  AORTA Ao Root diam: 3.20 cm MITRAL VALVE MV Area (PHT): 3.53 cm     SHUNTS MV Area VTI:   0.96 cm     Systemic VTI:  0.18 m MV Peak grad:  14.0 mmHg    Systemic Diam: 1.60 cm MV Mean grad:  3.0 mmHg MV Vmax:       1.87 m/s MV Vmean:      72.5 cm/s MV Decel Time: 215 msec MR Peak grad: 66.9 mmHg MR Vmax:      409.00 cm/s MV E velocity: 107.00 cm/s MV A velocity: 169.00 cm/s MV E/A ratio:  0.63 Vishnu Priya Mallipeddi Electronically signed by Diannah Late Mallipeddi Signature Date/Time: 01/06/2024/3:43:23 PM    Final    CT Angio Chest Aorta W and/or Wo Contrast Result Date: 01/05/2024 CLINICAL DATA:  Fall, abnormal chest x-ray. EXAM: CT ANGIOGRAPHY CHEST WITH CONTRAST TECHNIQUE: Multidetector CT imaging of the chest was performed using the standard protocol during bolus administration of intravenous contrast. Multiplanar CT image reconstructions and MIPs were obtained to evaluate the vascular anatomy. RADIATION DOSE REDUCTION: This exam was performed according to the departmental dose-optimization program which includes automated exposure control, adjustment of the mA and/or kV according to patient size and/or use of iterative reconstruction technique. CONTRAST:  75mL OMNIPAQUE  IOHEXOL  350 MG/ML SOLN COMPARISON:  01/05/2024, 12/23/2012, 08/24/2011. FINDINGS: Cardiovascular: The heart is enlarged and there is no pericardial effusion. Scattered coronary artery calcifications are present. There is atherosclerotic calcification the aorta a without evidence of aneurysm. The pulmonary trunk is  distended suggesting underlying pulmonary artery hypertension. No evidence of pulmonary embolism is seen. Mediastinum/Nodes: No mediastinal, hilar, or axillary lymphadenopathy is seen. There is a complex nodule in the right lobe of the thyroid  gland measuring 1.5 cm. The trachea and esophagus are within normal limits. Lungs/Pleura: Scattered ground-glass attenuation is noted in the lungs bilaterally. No effusion or pneumothorax. There is a 3 mm nodule in the right upper lobe, axial image 44, unchanged from 2014 and likely benign. No new nodule or mass is seen. Upper Abdomen: Stones are present within the gallbladder. A stable which shaped hypodensity is noted in the anterior right lobe of the liver, unchanged from 2013. Musculoskeletal: Degenerative changes are present in the thoracic spine. No acute osseous abnormality. Review of the MIP images confirms the above findings. IMPRESSION: 1. No evidence of pulmonary embolism. 2. Scattered ground-glass attenuation in the lungs bilaterally, possible edema or pneumonitis. 3. Distended pulmonary trunk suggesting underlying pulmonary artery hypertension. 4. Indeterminate right thyroid  nodule measuring 1.5 cm. Nonemergent ultrasound is recommended for further characterization. 5. Cholelithiasis. 6. Aortic atherosclerosis and coronary artery calcifications Electronically Signed  By: Leita Birmingham M.D.   On: 01/05/2024 18:09   CT Hip Left Wo Contrast Result Date: 01/05/2024 EXAM: CT OF THE LEFT HIP WITHOUT IV CONTRAST 01/05/2024 05:36:48 PM TECHNIQUE: CT of the left hip was performed without the administration of intravenous contrast. Multiplanar reformatted images are provided for review. Automated exposure control, iterative reconstruction, and/or weight based adjustment of the mA/kV was utilized to reduce the radiation dose to as low as reasonably achievable. COMPARISON: Hip radiographs dated 01/05/2024. CLINICAL HISTORY: Hip trauma, fracture suspected, xray done.  FINDINGS: BONES/JOINT:: Intertrochanteric fracture of the left proximal femur with fracture margins extending through the greater and lesser trochanters. There is lateral displacement anteriorly measuring up to 7 mm. The superior fracture margin extends to the level of the suture anchors within the greater trochanter. No dislocation. Mild osteoarthritis of the left hip. SOFT TISSUE: Soft tissue and intramuscular edema surrounding the left femoral intertrochanteric fracture. Small fat-containing left inguinal hernia. Arterial vascular calcifications. IMPRESSION: 1. Intertrochanteric fracture of the left proximal femur with lateral displacement anteriorly measuring up to 7 mm. The superior fracture margin extends to the level of the suture anchors within the greater trochanter. 2. Mild osteoarthritis of the left hip. Electronically signed by: Harrietta Sherry MD 01/05/2024 05:46 PM EST RP Workstation: [id number]   DG Chest Port 1 View Result Date: 01/05/2024 EXAM: 1 VIEW(S) XRAY OF THE CHEST 01/05/2024 04:54:00 PM COMPARISON: 07/17/2023 CLINICAL HISTORY: fall, diffuse pain FINDINGS: LUNGS AND PLEURA: Elevated right hemidiaphragm. No focal pulmonary opacity. No pulmonary edema. No pleural effusion. No pneumothorax. HEART AND MEDIASTINUM: Aortic atherosclerosis. There is new widening of the mediastinum compared to 07/17/2023 and new rightward deviation of the trachea. CTA of the chest is recommended to evaluate for vascular injury or adenopathy. No acute abnormality of the cardiac silhouette. BONES AND SOFT TISSUES: No acute osseous abnormality. IMPRESSION: 1. New widening of the mediastinum and new rightward deviation of the trachea compared to 07/17/2023. CTA of the chest is recommended to evaluate for vascular injury or adenopathy. 2. Elevated right hemidiaphragm. Electronically signed by: Norman Gatlin MD 01/05/2024 05:03 PM EST RP Workstation: [id number]   DG Hip Unilat W or Wo Pelvis 2-3 Views Left Result  Date: 01/05/2024 CLINICAL DATA:  Fall, diffuse pain.  Left hip pain. EXAM: DG HIP (WITH OR WITHOUT PELVIS) 2-3V LEFT COMPARISON:  01/16/2018. FINDINGS: A lucency is noted in the inter trochanteric region on the left, suspicious for nondisplaced fracture. Suture anchors are present at the greater trochanter. No dislocation is seen. Mild degenerative changes are noted at the hips. There are degenerative changes at the sacroiliac joints and lower lumbar spine. Vascular calcifications are present in the bilateral lower extremities. IMPRESSION: 1. Lucency in the intratrochanteric region on the left suggesting nondisplaced fracture. 2. Surgical changes at the greater trochanter on the left. Electronically Signed   By: Leita Birmingham M.D.   On: 01/05/2024 17:01   CT Head Wo Contrast Result Date: 01/05/2024 EXAM: CT HEAD WITHOUT CONTRAST 01/05/2024 04:40:10 PM TECHNIQUE: CT of the head was performed without the administration of intravenous contrast. Automated exposure control, iterative reconstruction, and/or weight based adjustment of the mA/kV was utilized to reduce the radiation dose to as low as reasonably achievable. COMPARISON: MRI brain 09/05/2020. CLINICAL HISTORY: near syncope, fall FINDINGS: BRAIN AND VENTRICLES: No acute hemorrhage. No evidence of acute infarct. Patchy periventricular and deep white matter low-attenuation, likely chronic microvascular ischemic changes. Prominent ventricles and sulci reflecting age-related volume loss. No extra-axial collection. No mass effect or midline  shift. Intracranial vascular calcifications. ORBITS: Bilateral lens replacements noted. SINUSES: No acute abnormality. SOFT TISSUES AND SKULL: No acute soft tissue abnormality. No skull fracture. IMPRESSION: 1. No acute intracranial abnormality. 2. Moderate chronic microvascular ischemia. 3. Age-related cerebral volume loss. Electronically signed by: Donnice Mania MD 01/05/2024 04:53 PM EST RP Workstation: [id number]     Microbiology: Results for orders placed or performed during the hospital encounter of 01/05/24  Surgical PCR screen     Status: None   Collection Time: 01/05/24  9:50 PM   Specimen: Nasal Mucosa; Nasal Swab  Result Value Ref Range Status   MRSA, PCR NEGATIVE NEGATIVE Final   Staphylococcus aureus NEGATIVE NEGATIVE Final    Comment: (NOTE) The Xpert SA Assay (FDA approved for NASAL specimens in patients 71 years of age and older), is one component of a comprehensive surveillance program. It is not intended to diagnose infection nor to guide or monitor treatment. Performed at [facility], [address], [place], [place] [address]     Labs: CBC: Recent Labs  Lab 01/05/24 1631 01/06/24 0500 01/07/24 0446 01/09/24 0439  WBC 9.4 11.6* 12.7* 10.9*  NEUTROABS 5.9  --   --   --   HGB 13.9 12.8 12.8 12.3  HCT 43.1 39.6 40.0 37.7  MCV 87.8 88.2 88.9 88.3  PLT 224 191 160 156   Basic Metabolic Panel: Recent Labs  Lab 01/05/24 1631 01/06/24 0500 01/07/24 0446 01/09/24 0439  NA 139 136 136 135  K 3.9 4.1 4.6 4.5  CL 103 100 99 99  CO2 24 27 25 29   GLUCOSE 111* 118* 147* 114*  BUN 18 15 18 19   CREATININE 0.80 0.76 0.84 0.78  CALCIUM 8.8* 8.7* 8.6* 8.6*  MG  --   --   --  2.4   Liver Function Tests: Recent Labs  Lab 01/05/24 1631  AST 26  ALT 15  ALKPHOS 70  BILITOT 0.4  PROT 7.1  ALBUMIN 4.2   CBG: Recent Labs  Lab 01/05/24 1612 01/06/24 1537  GLUCAP 125* 101*    Discharge time spent: 36 minutes.  Length of inpatient stay: 4 days  Signed: Carliss LELON Canales, DO [facility] 01/09/2024

## 2024-01-09 NOTE — Care Management Important Message (Signed)
 Important Message  Patient Details  Name: Lauren House MRN: [id number] Date of Birth: November 20, 1935   Important Message Given:  Yes - Medicare IM     [name] 01/09/2024, 11:25 AM

## 2024-01-09 NOTE — Plan of Care (Signed)
  Problem: Education: Goal: Knowledge of General Education information will improve Description: Including pain rating scale, medication(s)/side effects and non-pharmacologic comfort measures 01/09/2024 1203 by Mathews Norleen POUR, RN Outcome: Adequate for Discharge 01/09/2024 0949 by Mathews Norleen POUR, RN Outcome: Progressing   Problem: Health Behavior/Discharge Planning: Goal: Ability to manage health-related needs will improve 01/09/2024 1203 by Mathews Norleen POUR, RN Outcome: Adequate for Discharge 01/09/2024 0949 by Mathews Norleen POUR, RN Outcome: Progressing   Problem: Clinical Measurements: Goal: Ability to maintain clinical measurements within normal limits will improve 01/09/2024 1203 by Mathews Norleen POUR, RN Outcome: Adequate for Discharge 01/09/2024 0949 by Mathews Norleen POUR, RN Outcome: Progressing Goal: Will remain free from infection 01/09/2024 1203 by Mathews Norleen POUR, RN Outcome: Adequate for Discharge 01/09/2024 0949 by Mathews Norleen POUR, RN Outcome: Progressing Goal: Diagnostic test results will improve 01/09/2024 1203 by Mathews Norleen POUR, RN Outcome: Adequate for Discharge 01/09/2024 0949 by Mathews Norleen POUR, RN Outcome: Progressing Goal: Respiratory complications will improve 01/09/2024 1203 by Mathews Norleen POUR, RN Outcome: Adequate for Discharge 01/09/2024 0949 by Mathews Norleen POUR, RN Outcome: Progressing Goal: Cardiovascular complication will be avoided 01/09/2024 1203 by Mathews Norleen POUR, RN Outcome: Adequate for Discharge 01/09/2024 0949 by Mathews Norleen POUR, RN Outcome: Progressing   Problem: Activity: Goal: Risk for activity intolerance will decrease 01/09/2024 1203 by Mathews Norleen POUR, RN Outcome: Adequate for Discharge 01/09/2024 0949 by Mathews Norleen POUR, RN Outcome: Progressing   Problem: Nutrition: Goal: Adequate nutrition will be maintained 01/09/2024 1203 by Mathews Norleen POUR, RN Outcome: Adequate for Discharge 01/09/2024 0949 by Mathews Norleen POUR, RN Outcome: Progressing   Problem:  Coping: Goal: Level of anxiety will decrease 01/09/2024 1203 by Mathews Norleen POUR, RN Outcome: Adequate for Discharge 01/09/2024 0949 by Mathews Norleen POUR, RN Outcome: Progressing   Problem: Elimination: Goal: Will not experience complications related to bowel motility 01/09/2024 1203 by Mathews Norleen POUR, RN Outcome: Adequate for Discharge 01/09/2024 0949 by Mathews Norleen POUR, RN Outcome: Progressing Goal: Will not experience complications related to urinary retention 01/09/2024 1203 by Mathews Norleen POUR, RN Outcome: Adequate for Discharge 01/09/2024 0949 by Mathews Norleen POUR, RN Outcome: Progressing   Problem: Pain Managment: Goal: General experience of comfort will improve and/or be controlled 01/09/2024 1203 by Mathews Norleen POUR, RN Outcome: Adequate for Discharge 01/09/2024 0949 by Mathews Norleen POUR, RN Outcome: Progressing   Problem: Safety: Goal: Ability to remain free from injury will improve 01/09/2024 1203 by Mathews Norleen POUR, RN Outcome: Adequate for Discharge 01/09/2024 0949 by Mathews Norleen POUR, RN Outcome: Progressing   Problem: Skin Integrity: Goal: Risk for impaired skin integrity will decrease 01/09/2024 1203 by Mathews Norleen POUR, RN Outcome: Adequate for Discharge 01/09/2024 0949 by Mathews Norleen POUR, RN Outcome: Progressing

## 2024-01-10 ENCOUNTER — Non-Acute Institutional Stay (INDEPENDENT_AMBULATORY_CARE_PROVIDER_SITE_OTHER): Payer: Self-pay | Admitting: Adult Health

## 2024-01-10 ENCOUNTER — Encounter: Payer: Self-pay | Admitting: Adult Health

## 2024-01-10 DIAGNOSIS — E041 Nontoxic single thyroid nodule: Secondary | ICD-10-CM | POA: Diagnosis not present

## 2024-01-10 DIAGNOSIS — J9601 Acute respiratory failure with hypoxia: Secondary | ICD-10-CM | POA: Diagnosis not present

## 2024-01-10 DIAGNOSIS — S72002D Fracture of unspecified part of neck of left femur, subsequent encounter for closed fracture with routine healing: Secondary | ICD-10-CM | POA: Diagnosis not present

## 2024-01-10 DIAGNOSIS — I200 Unstable angina: Secondary | ICD-10-CM | POA: Diagnosis not present

## 2024-01-10 DIAGNOSIS — Z9861 Coronary angioplasty status: Secondary | ICD-10-CM | POA: Diagnosis not present

## 2024-01-10 DIAGNOSIS — E1169 Type 2 diabetes mellitus with other specified complication: Secondary | ICD-10-CM

## 2024-01-10 DIAGNOSIS — I2510 Atherosclerotic heart disease of native coronary artery without angina pectoris: Secondary | ICD-10-CM

## 2024-01-10 DIAGNOSIS — J309 Allergic rhinitis, unspecified: Secondary | ICD-10-CM | POA: Diagnosis not present

## 2024-01-10 NOTE — Progress Notes (Signed)
 Location:  [facility] Room Number: South/156/W Place of Service:  SNF (31)   CODE STATUS: Full Code  Allergies  Allergen Reactions   Aspirin  Other (See Comments)    Nose bleeds with full strength   Atorvastatin Other (See Comments)    Muscle pain   Atorvastatin Calcium Other (See Comments)    Unknown    Bactrim [Sulfamethoxazole -Trimethoprim] Other (See Comments)    Doesn't remember    Chlorhexidine  Itching   Clarithromycin Other (See Comments)    Doesn't remember   Colestipol Hcl Other (See Comments)    Unknown    Ezetimibe Other (See Comments)    GI upset   Penicillamine Other (See Comments)    Unknown    Prednisone  Other (See Comments)    Unknown    Simvastatin Other (See Comments)    Muscle spasms   Vancomycin  Hives   Cephalosporins Hives and Itching   Ciprofloxacin Nausea Only   Penicillins Rash    Immediate rash, facial/tongue/throat swelling, SOB or lightheadedness with hypotension   Tape Rash and Other (See Comments)    Chief Complaint  Patient presents with   Hospitalization Follow-up    HPI:  He is a 88 year old woman who has been hospitalized from 01-05-24 through 01-09-24. Her past medical history includes: CAD: type 2 diabetes mellitus; hypertension. She presented to the ED after a fall at home when she stood up after a nap and felt lightheaded. She was found to hypoxemic at 89% in the ED. She was also found to have an acute displaced intertrochanteric left femur fracture. She is status post IM nail on 01-06-24. She is weight bearing as tolerated.acute hypoxic respiratory failure: etiology is unclear; ct chest noted bilateral ground glass with no consolidation, fever, mildly elevated BNP. She responded well to lasix.she has been weaned to 2 liters 02.   She is here for short term rehab with her goal to return back home. She will continue to be followed for her chronic illnesses including: Unstable angina/CAD S/P percutaneous coronary  angioplasty: Allergic rhinitis unspecified seasonality unspecified trigger    Past Medical History:  Diagnosis Date   Anxiety    Apical variant hypertrophic cardiomyopathy (HCC)    Arthritis    Borderline diabetes mellitus    Bruises easily    Bursitis of left hip    Coronary atherosclerosis of native coronary artery    a. 2011: Cath showing nonobstructive CAD - 50-60% LAD (normal FFR) b. 06/2015: Cath showing 80% stenosis in the Mid LAD, tx w/ Promus DES 2.25 mm x 16 mm    Essential hypertension, benign    History of kidney stones    History of skin cancer    Basal cell - abdomen   Hyperlipidemia    Diet controlled.cannot take meds   Nocturia    Osteoporosis    Overactive bladder    Shortness of breath dyspnea    Vertigo     Past Surgical History:  Procedure Laterality Date   ABDOMINAL HYSTERECTOMY     CARDIAC CATHETERIZATION N/A 07/22/2015   Procedure: Left Heart Cath and Coronary Angiography;  Surgeon: Alm LELON Clay, MD;  Location: [facility] INVASIVE CV LAB;  Service: Cardiovascular;  Laterality: N/A;   CARDIAC CATHETERIZATION N/A 07/22/2015   Procedure: Coronary Stent Intervention;  Surgeon: Alm LELON Clay, MD;  Location: [facility] INVASIVE CV LAB;  Service: Cardiovascular;  Laterality: N/A;   CORONARY STENT PLACEMENT  07/22/2015   mid lad  des    EYE  SURGERY     cataracts bilateral   HEMORRHOID SURGERY     INGUINAL HERNIA REPAIR  10/11/2011   Procedure: HERNIA REPAIR INGUINAL ADULT;  Surgeon: Jina Nephew, MD;  Location: [facility] OR;  Service: General;  Laterality: Right;   INTRAMEDULLARY (IM) NAIL INTERTROCHANTERIC Left 01/06/2024   Procedure: FIXATION, FRACTURE, INTERTROCHANTERIC, WITH INTRAMEDULLARY ROD;  Surgeon: Onesimo Oneil LABOR, MD;  Location: [facility] ORS;  Service: Orthopedics;  Laterality: Left;   OPEN SURGICAL REPAIR OF GLUTEAL TENDON Left 03/12/2014   Procedure: LEFT HIP BURSECTOMY AND GLUTEAL TENDON REPAIR;  Surgeon: Dempsey Melodi GAILS, MD;  Location: [facility] ORS;  Service: Orthopedics;   Laterality: Left;    Social History   Socioeconomic History   Marital status: Widowed    Spouse name: Not on file   Number of children: 4   Years of education: 67   Highest education level: 12th grade  Occupational History   Not on file  Tobacco Use   Smoking status: Never    Passive exposure: Never   Smokeless tobacco: Never  Vaping Use   Vaping status: Never Used  Substance and Sexual Activity   Alcohol use: No   Drug use: No   Sexual activity: Not Currently  Other Topics Concern   Not on file  Social History Narrative   3 sons, 1 daughter. 1 son died in 2017/01/28 (unexpectedly)   Husband died in 01-29-2015.   6 grandchildren    10 great grandchildren   Social Drivers of Corporate Investment Banker Strain: Low Risk  (06/06/2023)   Overall Financial Resource Strain (CARDIA)    Difficulty of Paying Living Expenses: Not hard at all  Food Insecurity: No Food Insecurity (01/05/2024)   Hunger Vital Sign    Worried About Running Out of Food in the Last Year: Never true    Ran Out of Food in the Last Year: Never true  Transportation Needs: No Transportation Needs (01/05/2024)   PRAPARE - Administrator, Civil Service (Medical): No    Lack of Transportation (Non-Medical): No  Physical Activity: Insufficiently Active (06/06/2023)   Exercise Vital Sign    Days of Exercise per Week: 3 days    Minutes of Exercise per Session: 30 min  Stress: No Stress Concern Present (06/06/2023)   [employer] of Occupational Health - Occupational Stress Questionnaire    Feeling of Stress : Not at all  Social Connections: Moderately Isolated (01/05/2024)   Social Connection and Isolation Panel    Frequency of Communication with Friends and Family: Twice a week    Frequency of Social Gatherings with Friends and Family: Once a week    Attends Religious Services: More than 4 times per year    Active Member of [employer] or Organizations: No    Attends Banker Meetings: Never     Marital Status: Widowed  Intimate Partner Violence: Not At Risk (01/05/2024)   Humiliation, Afraid, Rape, and Kick questionnaire    Fear of Current or Ex-Partner: No    Emotionally Abused: No    Physically Abused: No    Sexually Abused: No   Family History  Problem Relation Age of Onset   Heart disease Father    Lung cancer Sister    Lung cancer Brother       VITAL SIGNS BP (!) 157/85   Pulse 74   Temp (!) 97.1 F (36.2 C)   Resp 20   Ht 5' 7 (1.702 m)   Wt 144 lb 9.6  oz (65.6 kg)   SpO2 96%   BMI 22.65 kg/m   Outpatient Encounter Medications as of 01/10/2024  Medication Sig Note   albuterol (PROVENTIL) (2.5 MG/3ML) 0.083% nebulizer solution Take 3 mLs (2.5 mg total) by nebulization every 6 (six) hours as needed for wheezing or shortness of breath.    aspirin  EC 81 MG tablet Take 81 mg by mouth daily.    Cyanocobalamin (VITAMIN B12 PO) Take 1 tablet by mouth daily.    fluticasone  (FLONASE ) 50 MCG/ACT nasal spray USE TWO SPRAY IN EACH NOSTRIL EVERY DAY (Patient taking differently: Place 2 sprays into both nostrils daily. USE TWO SPRAY IN EACH NOSTRIL EVERY DAY)    methocarbamol  (ROBAXIN -750) 750 MG tablet Take 1 tablet (750 mg total) by mouth at bedtime as needed for muscle spasms.    metoprolol  succinate (TOPROL  XL) 25 MG 24 hr tablet Take 1 tablet (25 mg total) by mouth daily.    Multiple Vitamins-Minerals (OCUVITE PO) Take 1 tablet by mouth daily.    nitroGLYCERIN  (NITROSTAT ) 0.4 MG SL tablet Place 1 tablet (0.4 mg total) under the tongue every 5 (five) minutes as needed for chest pain.    ondansetron  (ZOFRAN ) 4 MG tablet Take 1 tablet (4 mg total) by mouth every 6 (six) hours as needed for nausea.    oxyCODONE  (ROXICODONE ) 5 MG immediate release tablet Take 1 tablet (5 mg total) by mouth every 8 (eight) hours as needed.    Polyethyl Glycol-Propyl Glycol (SYSTANE OP) Apply 1 drop to eye 3 (three) times daily as needed (Dry eyes).    ranolazine  (RANEXA ) 500 MG 12 hr tablet  Take 1 tablet (500 mg total) by mouth 2 (two) times daily. 01/05/2024: Patient states she only takes med once daily in the morning   senna-docusate (SENOKOT-S) 8.6-50 MG tablet Take 1 tablet by mouth at bedtime as needed for mild constipation or moderate constipation.    No facility-administered encounter medications on file as of 01/10/2024.     SIGNIFICANT DIAGNOSTIC EXAMS  LABS  01-05-24: wbc 9.4; hgb 13.9; hct 43.1;mcv 87.8 plt 224; glucose 111; bun 18; creat 0.80; k+ 3.9; na++ 139; ca 8.8; gfr >60 protein 7.1 albumin 4.2 BNP 1064 01-07-24: wbc 12.7; hct 12.8; hct 40.4; mcv 88.9 plt 160; glucose 147; bun 18; creat 0.84; k+ 4.6; na++ 136; ca 8.6; gfr >60 tsh 0.740 01-09-24: wbc 10.9; hgb 12.3; hct 37.7; mcv 88.3 plt 156; glucose 114; bun 19; creat 0.78; k+ 4.5; na++ 135; ca 8.6 gfr >60; mag 2.4    Review of Systems  Constitutional:  Negative for malaise/fatigue.  Respiratory:  Negative for cough and shortness of breath.   Cardiovascular:  Negative for chest pain, palpitations and leg swelling.  Gastrointestinal:  Negative for abdominal pain, constipation and heartburn.  Musculoskeletal:  Negative for back pain, joint pain and myalgias.  Skin: Negative.   Neurological:  Negative for dizziness.  Psychiatric/Behavioral:  The patient is not nervous/anxious.      Physical Exam Constitutional:      General: She is not in acute distress.    Appearance: She is well-developed. She is not diaphoretic.  Neck:     Thyroid : No thyromegaly.     Comments: Right thyroid  nodule 1.5 cm  Cardiovascular:     Rate and Rhythm: Normal rate and regular rhythm.     Heart sounds: Normal heart sounds.  Pulmonary:     Effort: Pulmonary effort is normal. No respiratory distress.     Breath sounds: Normal breath sounds.  Abdominal:     General: Bowel sounds are normal. There is no distension.     Palpations: Abdomen is soft.     Tenderness: There is no abdominal tenderness.  Musculoskeletal:         General: Normal range of motion.     Cervical back: Neck supple.     Right lower leg: No edema.     Left lower leg: No edema.     Comments: Left hip IM nail   Lymphadenopathy:     Cervical: No cervical adenopathy.  Skin:    General: Skin is warm and dry.  Neurological:     Mental Status: She is alert. Mental status is at baseline.  Psychiatric:        Mood and Affect: Mood normal.       ASSESSMENT/ PLAN:  TODAY  Closed fracture of left hip with routine healing subsequent encounter : will continue therapy as directed and will follow up with orthopedics as directed. Has robaxin  750 mg nightly as needed; has oxycodone  5 mg every 8 hours as needed   2. Unstable angina/CAD S/P percutaneous coronary angioplasty: has prn ntg and will continue ranexa  500 mg twice daily; toprol  xl 25 mg daily; asa 81 mg daily     3.  Allergic rhinitis unspecified seasonality unspecified trigger/acute hypoxic respiratory failure: will continue albuterol neb every 6 hours as needed and will continue flonase  2 puffs each nostril daily     Barnie Seip NP [facility]   call [phone]

## 2024-01-13 ENCOUNTER — Telehealth: Payer: Self-pay | Admitting: Orthopedic Surgery

## 2024-01-13 ENCOUNTER — Encounter: Payer: Self-pay | Admitting: Adult Health

## 2024-01-13 NOTE — Telephone Encounter (Signed)
 Dr. Onesimo pt - spoke w/th Easter Ada [phone], pt's daughter, she would like the order sent over to the [facility] for them to be able to remove the sutures.  Per Dr. Fanny op note:  If the patient will be returning to a nursing facility, sutures can be trimmed around this time.

## 2024-01-13 NOTE — Telephone Encounter (Signed)
 Can you print and fax the op note to them? That's what I do, but Im in clinic if you dont get to it today let me know I can send tomorrow   For some reason they dont know how to find the op note in computer.

## 2024-01-14 ENCOUNTER — Encounter: Payer: Self-pay | Admitting: Internal Medicine

## 2024-01-14 ENCOUNTER — Non-Acute Institutional Stay (SKILLED_NURSING_FACILITY): Payer: Self-pay | Admitting: Internal Medicine

## 2024-01-14 DIAGNOSIS — S72002G Fracture of unspecified part of neck of left femur, subsequent encounter for closed fracture with delayed healing: Secondary | ICD-10-CM | POA: Diagnosis not present

## 2024-01-14 DIAGNOSIS — E041 Nontoxic single thyroid nodule: Secondary | ICD-10-CM

## 2024-01-14 DIAGNOSIS — R9389 Abnormal findings on diagnostic imaging of other specified body structures: Secondary | ICD-10-CM | POA: Diagnosis not present

## 2024-01-14 NOTE — Progress Notes (Unsigned)
 NURSING HOME LOCATION:  [facility] ROOM NUMBER:  [phone]  CODE STATUS:  Full Code  PCP:  Butler DASEN. Pickard MD  This is a comprehensive admission note to this SNFperformed on this date less than 30 days from date of admission. Included are preadmission medical/surgical history; reconciled medication list; family history; social history and comprehensive review of systems.  Corrections and additions to the records were documented. Comprehensive physical exam was also performed. Additionally a clinical summary was entered for each active diagnosis pertinent to this admission in the Problem List to enhance continuity of care.  HPI: She was hospitalized 11/9 - 01/09/2024 for acute displaced intertrochanteric left femur fracture in the context of lightheadedness upon standing after a nap.  In the ED she was found to be hypoxic with O2 sats of 89%.  CT of the chest revealed bilateral ground glass changes;CE; pulmonary trunk enlargement suggesting possible PAH; scattered coronary artery calcifications; and aortic atherosclerosis.  There was a small 3 mm right upper lobe nodule which is unchanged from 2014.  ProBNP was 1064.0.  Clinically there was improvement after initiation of furosemide diuresis.  She was weaned to 2 L of nasal oxygen by cannula.  Echo revealed preserved EF and stable LVH. Orthopedics completed IM nailing 11/10.  She was to be WBAT LLE; DVT prophylaxis was 81 mg aspirin  twice daily. Incidental finding on CT scan of the chest was 1.5 cm right thyroid  nodule.  TSH was low normal at 0.740.  Thyroid  ultrasound was to be completed following rehab. Glucoses while hospitalized ranged from a low 111 up to high of 147. There is no current A1c. Mild hypocalcemia was present with a value of 8.6.  Mild leukocytosis was present with a range of 9.4 up to a peak of 12.7; final value was 10,900.  Differential was normal. PT/OT consulted and recommended SNF placement for rehab.  Past  medical and surgical history: Includes apical variant hypertrophic cardiomyopathy; prediabetes; coronary artery atherosclerosis; essential hypertension; history of nephrolithiasis; dyslipidemia; OAB; and osteoporosis. Surgeries and procedures include cardiac catheterization; abdominal hysterectomy; coronary artery stenting; and open surgical repair of gluteal tendon.  Family history: reviewed, non contributory due to advanced age.  Social history: Non-smoker, nondrinker.  She has no occupational exposures.  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   Review of systems: She validates that there was no definite cardiac or neurologic prodrome prior to the fall.  She states that she had fallen asleep after listening to her son preach on YouTube.  Upon arising she became fuzzy headed and fell.  She did experience slight tachycardia.  She describes urinary incontinence for approximately a year.  She states that the left lower extremity feels like it is not walking rather than pain symptoms.  She validates that she has a small sore on her buttocks.  Carefully she had minor epistaxis in the context of using oxygen here at the SNF.  She has no other bleeding dyscrasias.  Constitutional: No fever, significant weight change, fatigue  Eyes: No redness, discharge, pain, vision change ENT/mouth: No nasal congestion, purulent discharge, earache, change in hearing, sore throat  Cardiovascular: No chest pain, palpitations, paroxysmal nocturnal dyspnea, claudication, edema  Respiratory: No cough, sputum production, hemoptysis, DOE, significant snoring, apnea Gastrointestinal: No heartburn, dysphagia, abdominal pain, nausea /vomiting, rectal bleeding, melena, change in bowels Genitourinary: No dysuria, hematuria, pyuria, incontinence, nocturia Musculoskeletal: No joint stiffness, joint swelling, weakness, pain Dermatologic: No rash, pruritus, change in appearance of skin Neurologic: No  dizziness, headache, syncope,  seizures, numbness, tingling Psychiatric: No significant anxiety, depression, insomnia, anorexia Endocrine: No change in hair/skin/nails, excessive thirst, excessive hunger, excessive urination  Hematologic/lymphatic: No significant bruising, lymphadenopathy, abnormal bleeding Allergy/immunology: No itchy/watery eyes, significant sneezing, urticaria, angioedema  Physical exam:  Pertinent or positive findings: She is thin and appears suboptimally nourished.  Initially she was asleep exhibiting hypopnea without snoring or frank apnea.  Grade 1 have systolic murmur suggested at the right base.  She has low-grade rales at the bases, greater on the right than the left.  There is a suggestion of an aortic bruit.  Clinically there is no evidence of an aneurysm of the aorta.  Abdomen is protuberant.  Pedal pulses are decreased to palpation.  She has interosseous wasting and limb atrophy. General appearance: Adequately nourished; no acute distress, increased work of breathing is present.   Lymphatic: No lymphadenopathy about the head, neck, axilla. Eyes: No conjunctival inflammation or lid edema is present. There is no scleral icterus. Ears:  External ear exam shows no significant lesions or deformities.   Nose:  External nasal examination shows no deformity or inflammation. Nasal mucosa are pink and moist without lesions, exudates Oral exam: Lips and gums are healthy appearing.There is no oropharyngeal erythema or exudate. Neck:  No thyromegaly, masses, tenderness noted.    Heart:  Normal rate and regular rhythm. S1 and S2 normal without gallop, murmur, click, rub.  Lungs: Chest clear to auscultation without wheezes, rhonchi, rales, rubs. Abdomen: Bowel sounds are normal.  Abdomen is soft and nontender with no organomegaly, hernias, masses. GU: Deferred  Extremities:  No cyanosis, clubbing, edema. Neurologic exam:  Strength equal  in upper & lower extremities. Balance, Rhomberg, finger to nose testing  could not be completed due to clinical state Deep tendon reflexes are equal Skin: Warm & dry w/o tenting. No significant lesions or rash.  See clinical summary under each active problem in the Problem List with associated updated therapeutic plan

## 2024-01-14 NOTE — Assessment & Plan Note (Signed)
 Delayed healing will be expected in the context of her osteoporosis.  PT/OT at SNF as tolerated.  Ortho follow-up as scheduled. Repeat the isometric exercises discussed 4- 5 times prior to standing if you've been seated for a period of time.

## 2024-01-14 NOTE — Assessment & Plan Note (Addendum)
 Rales present on exam;Pulmonolgy referral recommended to rule out interstitial lung disease.  No past medical history of significant occupational exposures. ProBNP was elevated with a value of 1064 but radiographically there was no evidence of acute CHF.

## 2024-01-14 NOTE — Assessment & Plan Note (Addendum)
 Nodules not palpable on exam. I have reviewed this issue with her and recommend that she contact her PCP following discharge to the SNF.

## 2024-01-14 NOTE — Patient Instructions (Signed)
 See assessment and plan under each diagnosis in the problem list and acutely for this visit

## 2024-01-20 ENCOUNTER — Non-Acute Institutional Stay (SKILLED_NURSING_FACILITY): Payer: Self-pay | Admitting: Adult Health

## 2024-01-20 ENCOUNTER — Encounter: Payer: Self-pay | Admitting: Adult Health

## 2024-01-20 ENCOUNTER — Other Ambulatory Visit: Payer: Self-pay | Admitting: Adult Health

## 2024-01-20 DIAGNOSIS — R262 Difficulty in walking, not elsewhere classified: Secondary | ICD-10-CM | POA: Diagnosis not present

## 2024-01-20 DIAGNOSIS — J9601 Acute respiratory failure with hypoxia: Secondary | ICD-10-CM | POA: Diagnosis not present

## 2024-01-20 DIAGNOSIS — M6281 Muscle weakness (generalized): Secondary | ICD-10-CM | POA: Diagnosis not present

## 2024-01-20 DIAGNOSIS — Z9181 History of falling: Secondary | ICD-10-CM | POA: Diagnosis not present

## 2024-01-20 DIAGNOSIS — E041 Nontoxic single thyroid nodule: Secondary | ICD-10-CM | POA: Diagnosis not present

## 2024-01-20 DIAGNOSIS — Z4789 Encounter for other orthopedic aftercare: Secondary | ICD-10-CM | POA: Diagnosis not present

## 2024-01-20 DIAGNOSIS — S72002G Fracture of unspecified part of neck of left femur, subsequent encounter for closed fracture with delayed healing: Secondary | ICD-10-CM

## 2024-01-20 DIAGNOSIS — R2681 Unsteadiness on feet: Secondary | ICD-10-CM | POA: Diagnosis not present

## 2024-01-20 DIAGNOSIS — S72142D Displaced intertrochanteric fracture of left femur, subsequent encounter for closed fracture with routine healing: Secondary | ICD-10-CM | POA: Diagnosis not present

## 2024-01-20 MED ORDER — METHOCARBAMOL 750 MG PO TABS: 750.0000 mg | ORAL_TABLET | Freq: Every evening | ORAL | 0 refills | Status: DC | PRN

## 2024-01-20 MED ORDER — ONDANSETRON HCL 4 MG PO TABS: 4.0000 mg | ORAL_TABLET | Freq: Four times a day (QID) | ORAL | 0 refills | Status: AC | PRN

## 2024-01-20 MED ORDER — NITROGLYCERIN 0.4 MG SL SUBL: 0.4000 mg | SUBLINGUAL_TABLET | SUBLINGUAL | 0 refills | Status: DC | PRN

## 2024-01-20 MED ORDER — METOPROLOL SUCCINATE ER 25 MG PO TB24: 25.0000 mg | ORAL_TABLET | Freq: Every day | ORAL | 0 refills | Status: DC

## 2024-01-20 MED ORDER — RANOLAZINE ER 500 MG PO TB12: 500.0000 mg | ORAL_TABLET | Freq: Two times a day (BID) | ORAL | 0 refills | Status: AC

## 2024-01-20 NOTE — Progress Notes (Unsigned)
 Location:  [facility] Room Number: 158 Place of Service:  SNF (31)   CODE STATUS: full   Allergies  Allergen Reactions   Aspirin  Other (See Comments)    Nose bleeds with full strength   Atorvastatin Other (See Comments)    Muscle pain   Atorvastatin Calcium Other (See Comments)    Unknown    Bactrim [Sulfamethoxazole -Trimethoprim] Other (See Comments)    Doesn't remember    Chlorhexidine  Itching   Clarithromycin Other (See Comments)    Doesn't remember   Colestipol Hcl Other (See Comments)    Unknown    Ezetimibe Other (See Comments)    GI upset   Penicillamine Other (See Comments)    Unknown    Prednisone  Other (See Comments)    Unknown    Simvastatin Other (See Comments)    Muscle spasms   Vancomycin  Hives   Cephalosporins Hives and Itching   Ciprofloxacin Nausea Only   Penicillins Rash    Immediate rash, facial/tongue/throat swelling, SOB or lightheadedness with hypotension   Tape Rash and Other (See Comments)    Chief Complaint  Patient presents with   Discharge Note    HPI:  She is being discharged to home with home health for pt/ot. She will need a transport wheelchair. She will need her prescriptions written and will need to follow up with her pcp. She has been hospitalized after a fall at home when she stood up after a nap and felt lightheaded. She was found to hypoxemic at 89% in the ED. She was also found to have an acute displaced intertrochanteric left femur fracture. She is status post IM nail on 01-06-24. She was admitted to this facility for short term rehab: ambulate 100 feet with rolling walker and min assist; upper body contact guard lower body max assist. SLUMS 22/30.   Past Medical History:  Diagnosis Date   Anxiety    Apical variant hypertrophic cardiomyopathy (HCC)    Arthritis    Borderline diabetes mellitus    Bruises easily    Bursitis of left hip    [facility] 10/18/2008   Qualifier: Diagnosis of   By: Tamra MD,  Christine         Coronary atherosclerosis of native coronary artery    a. 2011: Cath showing nonobstructive CAD - 50-60% LAD (normal FFR) b. 06/2015: Cath showing 80% stenosis in the Mid LAD, tx w/ Promus DES 2.25 mm x 16 mm    Essential hypertension, benign    History of kidney stones    History of skin cancer    Basal cell - abdomen   Hyperlipidemia    Diet controlled.cannot take meds   Nocturia    Osteoporosis    Overactive bladder    Shortness of breath dyspnea    Vertigo     Past Surgical History:  Procedure Laterality Date   ABDOMINAL HYSTERECTOMY     CARDIAC CATHETERIZATION N/A 07/22/2015   Procedure: Left Heart Cath and Coronary Angiography;  Surgeon: Alm LELON Clay, MD;  Location: [facility] INVASIVE CV LAB;  Service: Cardiovascular;  Laterality: N/A;   CARDIAC CATHETERIZATION N/A 07/22/2015   Procedure: Coronary Stent Intervention;  Surgeon: Alm LELON Clay, MD;  Location: [facility] INVASIVE CV LAB;  Service: Cardiovascular;  Laterality: N/A;   CORONARY STENT PLACEMENT  07/22/2015   mid lad  des    EYE SURGERY     cataracts bilateral   HEMORRHOID SURGERY     INGUINAL HERNIA REPAIR  10/11/2011  Procedure: HERNIA REPAIR INGUINAL ADULT;  Surgeon: Jina Nephew, MD;  Location: [facility] OR;  Service: General;  Laterality: Right;   INTRAMEDULLARY (IM) NAIL INTERTROCHANTERIC Left 01/06/2024   Procedure: FIXATION, FRACTURE, INTERTROCHANTERIC, WITH INTRAMEDULLARY ROD;  Surgeon: Onesimo Oneil LABOR, MD;  Location: [facility] ORS;  Service: Orthopedics;  Laterality: Left;   OPEN SURGICAL REPAIR OF GLUTEAL TENDON Left 03/12/2014   Procedure: LEFT HIP BURSECTOMY AND GLUTEAL TENDON REPAIR;  Surgeon: Dempsey Melodi GAILS, MD;  Location: [facility] ORS;  Service: Orthopedics;  Laterality: Left;    Social History   Socioeconomic History   Marital status: Widowed    Spouse name: Not on file   Number of children: 4   Years of education: 18   Highest education level: 12th grade  Occupational History   Not on file  Tobacco Use    Smoking status: Never    Passive exposure: Never   Smokeless tobacco: Never  Vaping Use   Vaping status: Never Used  Substance and Sexual Activity   Alcohol use: No   Drug use: No   Sexual activity: Not Currently  Other Topics Concern   Not on file  Social History Narrative   3 sons, 1 daughter. 1 son died in 04-Feb-2017 (unexpectedly)   Husband died in 2015/02/05.   6 grandchildren    10 great grandchildren   Social Drivers of Corporate Investment Banker Strain: Low Risk  (06/06/2023)   Overall Financial Resource Strain (CARDIA)    Difficulty of Paying Living Expenses: Not hard at all  Food Insecurity: No Food Insecurity (01/05/2024)   Hunger Vital Sign    Worried About Running Out of Food in the Last Year: Never true    Ran Out of Food in the Last Year: Never true  Transportation Needs: No Transportation Needs (01/05/2024)   PRAPARE - Administrator, Civil Service (Medical): No    Lack of Transportation (Non-Medical): No  Physical Activity: Insufficiently Active (06/06/2023)   Exercise Vital Sign    Days of Exercise per Week: 3 days    Minutes of Exercise per Session: 30 min  Stress: No Stress Concern Present (06/06/2023)   [employer] of Occupational Health - Occupational Stress Questionnaire    Feeling of Stress : Not at all  Social Connections: Moderately Isolated (01/05/2024)   Social Connection and Isolation Panel    Frequency of Communication with Friends and Family: Twice a week    Frequency of Social Gatherings with Friends and Family: Once a week    Attends Religious Services: More than 4 times per year    Active Member of [employer] or Organizations: No    Attends Banker Meetings: Never    Marital Status: Widowed  Intimate Partner Violence: Not At Risk (01/05/2024)   Humiliation, Afraid, Rape, and Kick questionnaire    Fear of Current or Ex-Partner: No    Emotionally Abused: No    Physically Abused: No    Sexually Abused: No   Family History   Problem Relation Age of Onset   Heart disease Father    Lung cancer Sister    Lung cancer Brother       VITAL SIGNS BP 128/68   Pulse 74   Temp 97.9 F (36.6 C)   Resp 18   Ht 5' 7 (1.702 m)   Wt 141 lb 8 oz (64.2 kg)   SpO2 91%   BMI 22.16 kg/m   Outpatient Encounter Medications as of 01/20/2024  Medication Sig  aspirin  EC 81 MG tablet Take 81 mg by mouth daily.   Cyanocobalamin (VITAMIN B12 PO) Take 1 tablet by mouth daily.   fluticasone  (FLONASE ) 50 MCG/ACT nasal spray USE TWO SPRAY IN EACH NOSTRIL EVERY DAY (Patient taking differently: Place 2 sprays into both nostrils daily. USE TWO SPRAY IN EACH NOSTRIL EVERY DAY)   methocarbamol  (ROBAXIN -750) 750 MG tablet Take 1 tablet (750 mg total) by mouth at bedtime as needed for muscle spasms.   metoprolol  succinate (TOPROL  XL) 25 MG 24 hr tablet Take 1 tablet (25 mg total) by mouth daily.   Multiple Vitamins-Minerals (OCUVITE PO) Take 1 tablet by mouth daily.   nitroGLYCERIN  (NITROSTAT ) 0.4 MG SL tablet Place 1 tablet (0.4 mg total) under the tongue every 5 (five) minutes as needed for chest pain.   ondansetron  (ZOFRAN ) 4 MG tablet Take 1 tablet (4 mg total) by mouth every 6 (six) hours as needed for nausea.   Polyethyl Glycol-Propyl Glycol (SYSTANE OP) Apply 1 drop to eye 3 (three) times daily as needed (Dry eyes).   ranolazine  (RANEXA ) 500 MG 12 hr tablet Take 1 tablet (500 mg total) by mouth 2 (two) times daily.   senna-docusate (SENOKOT-S) 8.6-50 MG tablet Take 1 tablet by mouth at bedtime as needed for mild constipation or moderate constipation.   No facility-administered encounter medications on file as of 01/20/2024.     SIGNIFICANT DIAGNOSTIC EXAMS  LABS  01-05-24: wbc 9.4; hgb 13.9; hct 43.1;mcv 87.8 plt 224; glucose 111; bun 18; creat 0.80; k+ 3.9; na++ 139; ca 8.8; gfr >60 protein 7.1 albumin 4.2 BNP 1064 01-07-24: wbc 12.7; hct 12.8; hct 40.4; mcv 88.9 plt 160; glucose 147; bun 18; creat 0.84; k+ 4.6; na++ 136;  ca 8.6; gfr >60 tsh 0.740 01-09-24: wbc 10.9; hgb 12.3; hct 37.7; mcv 88.3 plt 156; glucose 114; bun 19; creat 0.78; k+ 4.5; na++ 135; ca 8.6 gfr >60; mag 2.4    Review of Systems  Constitutional:  Negative for malaise/fatigue.  Respiratory:  Negative for cough and shortness of breath.   Cardiovascular:  Positive for leg swelling. Negative for chest pain and palpitations.  Gastrointestinal:  Negative for abdominal pain, constipation and heartburn.  Musculoskeletal:  Negative for back pain, joint pain and myalgias.  Skin: Negative.   Neurological:  Negative for dizziness.  Psychiatric/Behavioral:  The patient is not nervous/anxious.     Physical Exam Constitutional:      General: She is not in acute distress.    Appearance: She is well-developed. She is not diaphoretic.  Neck:     Thyroid : No thyromegaly.     Comments: Right thyroid  nodule 1.5 cm  Cardiovascular:     Rate and Rhythm: Normal rate and regular rhythm.     Heart sounds: Normal heart sounds.  Pulmonary:     Effort: Pulmonary effort is normal. No respiratory distress.     Breath sounds: Normal breath sounds.  Abdominal:     General: Bowel sounds are normal. There is no distension.     Palpations: Abdomen is soft.     Tenderness: There is no abdominal tenderness.  Musculoskeletal:        General: Normal range of motion.     Cervical back: Neck supple.     Right lower leg: No edema.     Left lower leg: Edema present.  Lymphadenopathy:     Cervical: No cervical adenopathy.  Skin:    General: Skin is warm and dry.  Neurological:     Mental  Status: She is alert. Mental status is at baseline.  Psychiatric:        Mood and Affect: Mood normal.     ASSESSMENT/ PLAN:   Patient is being discharged with the following home health services:  pt/ot: to evaluate and treat as indicated for gait; strength balance adl training.   Patient is being discharged with the following durable medical equipment:  transport  chair  Patient has been advised to f/u with their PCP in 1-2 weeks to for a transitions of care visit.  Social services at their facility was responsible for arranging this appointment.  Pt was provided with adequate prescriptions of noncontrolled medications to reach the scheduled appointment .  For controlled substances, a limited supply was provided as appropriate for the individual patient.  If the pt normally receives these medications from a pain clinic or has a contract with another physician, these medications should be received from that clinic or physician only).    A 30 day supply of medications have been sent to [facility]  Time spent with patient: medications; dme; home health.   Barnie Seip NP [facility]  call [phone]

## 2024-01-22 ENCOUNTER — Telehealth: Payer: Self-pay

## 2024-01-22 NOTE — Transitions of Care (Post Inpatient/ED Visit) (Signed)
 01/22/2024  Name: Lauren House MRN: [id number] DOB: 02/20/1936  Today's TOC FU Call Status: Today's TOC FU Call Status:: Successful TOC FU Call Completed TOC FU Call Complete Date: 01/21/24  Patient's Name and Date of Birth confirmed. Name, DOB  Transition Care Management Follow-up Telephone Call Date of Discharge: 01/21/24 Discharge Facility: Other [facility]) Name of Other ([facility]) Discharge Facility: [facility] Type of Discharge: Inpatient Admission Primary Inpatient Discharge Diagnosis:: muscle weakness fx femur How have you been since you were released from the hospital?: Better Any questions or concerns?: No  Items Reviewed: Did you receive and understand the discharge instructions provided?: Yes Medications obtained,verified, and reconciled?: Yes (Medications Reviewed) Any new allergies since your discharge?: No Dietary orders reviewed?: Yes Do you have support at home?: Yes People in Home [RPT]: child(ren), adult  Medications Reviewed Today: Medications Reviewed Today     Reviewed by Emmitt Pan, LPN (Licensed Practical Nurse) on 01/22/24 at [phone]  Med List Status: <None>   Medication Order Taking? Sig Documenting Provider Last Dose Status Informant  aspirin  EC 81 MG tablet [id number] Yes Take 81 mg by mouth daily. [provider]  Active Self, Pharmacy Records  Cyanocobalamin (VITAMIN B12 PO) [id number] Yes Take 1 tablet by mouth daily. [provider]  Active Self, Pharmacy Records  fluticasone  (FLONASE ) 50 MCG/ACT nasal spray [id number] Yes USE TWO SPRAY IN EACH NOSTRIL EVERY DAY  Patient taking differently: Place 2 sprays into both nostrils daily. USE TWO SPRAY IN EACH NOSTRIL EVERY DAY   Duanne Butler DASEN, MD  Active Self, Pharmacy Records  methocarbamol  (ROBAXIN -750) 750 MG tablet [id number] Yes Take 1 tablet (750 mg total) by mouth at bedtime as needed for muscle spasms. Landy Barnie RAMAN, NP  Active   metoprolol  succinate (TOPROL  XL) 25  MG 24 hr tablet [id number] Yes Take 1 tablet (25 mg total) by mouth daily. Landy Barnie RAMAN, NP  Active   Multiple Vitamins-Minerals (OCUVITE PO) [id number] Yes Take 1 tablet by mouth daily. [provider]  Active Self, Pharmacy Records  nitroGLYCERIN  (NITROSTAT ) 0.4 MG SL tablet [id number] Yes Place 1 tablet (0.4 mg total) under the tongue every 5 (five) minutes as needed for chest pain. Landy Barnie RAMAN, NP  Active   ondansetron  (ZOFRAN ) 4 MG tablet [id number] Yes Take 1 tablet (4 mg total) by mouth every 6 (six) hours as needed for nausea. Landy Barnie RAMAN, NP  Active   Polyethyl Glycol-Propyl Glycol (SYSTANE OP) [id number] Yes Apply 1 drop to eye 3 (three) times daily as needed (Dry eyes). [provider]  Active Self, Pharmacy Records  ranolazine  (RANEXA ) 500 MG 12 hr tablet [id number] Yes Take 1 tablet (500 mg total) by mouth 2 (two) times daily. Landy Barnie RAMAN, NP  Active   senna-docusate (SENOKOT-S) 8.6-50 MG tablet [id number] Yes Take 1 tablet by mouth at bedtime as needed for mild constipation or moderate constipation. Long, Joshua G, MD  Active Self, Pharmacy Records            Home Care and Equipment/Supplies: Were Home Health Services Ordered?: NA  Functional Questionnaire: Do you need assistance with bathing/showering or dressing?: Yes Do you need assistance with meal preparation?: Yes Do you need assistance with eating?: No Do you have difficulty maintaining continence: No Do you need assistance with getting out of bed/getting out of a chair/moving?: Yes Do you have difficulty managing or taking your medications?: No  Follow up appointments reviewed: PCP Follow-up appointment confirmed?: Yes Date of PCP follow-up appointment?: 01/27/24  Follow-up Provider: [facility] Follow-up appointment confirmed?: Yes Date of Specialist follow-up appointment?: 02/18/24 Follow-Up Specialty Provider:: orhto Do you need transportation to your follow-up  appointment?: No Do you understand care options if your condition(s) worsen?: Yes-patient verbalized understanding    SIGNATURE Julian Lemmings, LPN [facility] Nurse Health Advisor Direct Dial [phone]

## 2024-01-27 ENCOUNTER — Encounter: Payer: Self-pay | Admitting: Family Medicine

## 2024-01-27 ENCOUNTER — Ambulatory Visit: Admitting: Family Medicine

## 2024-01-27 VITALS — BP 130/64 | HR 85 | Temp 97.6°F | Ht 67.0 in | Wt 131.0 lb

## 2024-01-27 DIAGNOSIS — L98421 Non-pressure chronic ulcer of back limited to breakdown of skin: Secondary | ICD-10-CM

## 2024-01-27 DIAGNOSIS — Z8781 Personal history of (healed) traumatic fracture: Secondary | ICD-10-CM | POA: Diagnosis not present

## 2024-01-27 DIAGNOSIS — Z8639 Personal history of other endocrine, nutritional and metabolic disease: Secondary | ICD-10-CM

## 2024-01-27 DIAGNOSIS — R0609 Other forms of dyspnea: Secondary | ICD-10-CM

## 2024-01-27 NOTE — Progress Notes (Signed)
 Subjective:    Patient ID: Lauren House, female    DOB: 12-Dec-1935, 88 y.o.   MRN: [id number]  Unfortunately, the patient recently fell and sustained a fracture in her left femur.  She was admitted to the hospital and underwent intramedullary nailing of the left femur.  Hospitalization was complicated by hypoxia.  CT scan showed bilateral ground glass opacities concerning for possible pneumonitis versus pulmonary edema.  Her BNP was elevated.  She responded to Lasix.  Of note, in 2017, the patient had a catheterization which did reveal a 50% stenosis in one of the coronary arteries.  She will underwent stenting of an 80% stenosis and another coronary artery.  She has been unable to take statin therapy so there is concern about possible progression.  She does report increasing shortness of breath with activity and occasional chest tightness with activity even prior to her most recent hospitalization.  Echocardiogram in the hospital showed an ejection fraction of 60% and known hypertrophic cardiomyopathy.  During the workup of her hypoxia, the patient had a CT scan of the lung that did show a coincidental finding of a thyroid  nodule.  They recommended follow-up with me.  With regards to her leg, she is using Tylenol  for pain.  She is ambulating with use of a walker.  She stated with her daughter who is helping with her physical therapy.  The wounds on the lateral aspect of her left leg are well-healed.  I removed the Steri-Strips today.  There is no evidence of any secondary cellulitis.  The patient does have a stage II pressure ulcer over her sacrum.  It is roughly 1 cm in diameter but there is no evidence of any secondary cellulitis Past Medical History:  Diagnosis Date   Anxiety    Apical variant hypertrophic cardiomyopathy (HCC)    Arthritis    Borderline diabetes mellitus    Bruises easily    Bursitis of left hip    [facility] 10/18/2008   Qualifier: Diagnosis of   By: Tamra MD, Christine          Coronary atherosclerosis of native coronary artery    a. 2011: Cath showing nonobstructive CAD - 50-60% LAD (normal FFR) b. 06/2015: Cath showing 80% stenosis in the Mid LAD, tx w/ Promus DES 2.25 mm x 16 mm    Essential hypertension, benign    History of kidney stones    History of skin cancer    Basal cell - abdomen   Hyperlipidemia    Diet controlled.cannot take meds   Nocturia    Osteoporosis    Overactive bladder    Shortness of breath dyspnea    Vertigo    Past Surgical History:  Procedure Laterality Date   ABDOMINAL HYSTERECTOMY     CARDIAC CATHETERIZATION N/A 07/22/2015   Procedure: Left Heart Cath and Coronary Angiography;  Surgeon: Alm LELON Clay, MD;  Location: [facility] INVASIVE CV LAB;  Service: Cardiovascular;  Laterality: N/A;   CARDIAC CATHETERIZATION N/A 07/22/2015   Procedure: Coronary Stent Intervention;  Surgeon: Alm LELON Clay, MD;  Location: [facility] INVASIVE CV LAB;  Service: Cardiovascular;  Laterality: N/A;   CORONARY STENT PLACEMENT  07/22/2015   mid lad  des    EYE SURGERY     cataracts bilateral   HEMORRHOID SURGERY     INGUINAL HERNIA REPAIR  10/11/2011   Procedure: HERNIA REPAIR INGUINAL ADULT;  Surgeon: Jina Nephew, MD;  Location: [facility] OR;  Service: General;  Laterality: Right;   INTRAMEDULLARY (IM)  NAIL INTERTROCHANTERIC Left 01/06/2024   Procedure: FIXATION, FRACTURE, INTERTROCHANTERIC, WITH INTRAMEDULLARY ROD;  Surgeon: Onesimo Oneil LABOR, MD;  Location: [facility] ORS;  Service: Orthopedics;  Laterality: Left;   OPEN SURGICAL REPAIR OF GLUTEAL TENDON Left 03/12/2014   Procedure: LEFT HIP BURSECTOMY AND GLUTEAL TENDON REPAIR;  Surgeon: Dempsey Melodi GAILS, MD;  Location: [facility] ORS;  Service: Orthopedics;  Laterality: Left;    Allergies  Allergen Reactions   Aspirin  Other (See Comments)    Nose bleeds with full strength   Atorvastatin Other (See Comments)    Muscle pain   Atorvastatin Calcium Other (See Comments)    Unknown    Bactrim [Sulfamethoxazole -Trimethoprim] Other (See  Comments)    Doesn't remember    Chlorhexidine  Itching   Clarithromycin Other (See Comments)    Doesn't remember   Colestipol Hcl Other (See Comments)    Unknown    Ezetimibe Other (See Comments)    GI upset   Penicillamine Other (See Comments)    Unknown    Prednisone  Other (See Comments)    Unknown    Simvastatin Other (See Comments)    Muscle spasms   Vancomycin  Hives   Cephalosporins Hives and Itching   Ciprofloxacin Nausea Only   Penicillins Rash    Immediate rash, facial/tongue/throat swelling, SOB or lightheadedness with hypotension   Tape Rash and Other (See Comments)   Social History   Socioeconomic History   Marital status: Widowed    Spouse name: Not on file   Number of children: 4   Years of education: 34   Highest education level: 12th grade  Occupational History   Not on file  Tobacco Use   Smoking status: Never    Passive exposure: Never   Smokeless tobacco: Never  Vaping Use   Vaping status: Never Used  Substance and Sexual Activity   Alcohol use: No   Drug use: No   Sexual activity: Not Currently  Other Topics Concern   Not on file  Social History Narrative   3 sons, 1 daughter. 1 son died in 2017-02-06 (unexpectedly)   Husband died in 02-07-2015.   6 grandchildren    10 great grandchildren   Social Drivers of Corporate Investment Banker Strain: Low Risk  (06/06/2023)   Overall Financial Resource Strain (CARDIA)    Difficulty of Paying Living Expenses: Not hard at all  Food Insecurity: No Food Insecurity (01/05/2024)   Hunger Vital Sign    Worried About Running Out of Food in the Last Year: Never true    Ran Out of Food in the Last Year: Never true  Transportation Needs: No Transportation Needs (01/05/2024)   PRAPARE - Administrator, Civil Service (Medical): No    Lack of Transportation (Non-Medical): No  Physical Activity: Insufficiently Active (06/06/2023)   Exercise Vital Sign    Days of Exercise per Week: 3 days    Minutes of Exercise  per Session: 30 min  Stress: No Stress Concern Present (06/06/2023)   [employer] of Occupational Health - Occupational Stress Questionnaire    Feeling of Stress : Not at all  Social Connections: Moderately Isolated (01/05/2024)   Social Connection and Isolation Panel    Frequency of Communication with Friends and Family: Twice a week    Frequency of Social Gatherings with Friends and Family: Once a week    Attends Religious Services: More than 4 times per year    Active Member of [employer] or Organizations: No    Attends [employer]  or Organization Meetings: Never    Marital Status: Widowed  Intimate Partner Violence: Not At Risk (01/05/2024)   Humiliation, Afraid, Rape, and Kick questionnaire    Fear of Current or Ex-Partner: No    Emotionally Abused: No    Physically Abused: No    Sexually Abused: No   Family History  Problem Relation Age of Onset   Heart disease Father    Lung cancer Sister    Lung cancer Brother       Review of Systems     Objective:   Physical Exam Vitals reviewed.  Constitutional:      General: She is not in acute distress.    Appearance: Normal appearance. She is normal weight. She is not ill-appearing, toxic-appearing or diaphoretic.  HENT:     Head: Normocephalic and atraumatic.     Right Ear: Tympanic membrane and ear canal normal. Tympanic membrane is not erythematous.     Left Ear: Tympanic membrane and ear canal normal. Tympanic membrane is not erythematous.     Mouth/Throat:     Pharynx: Oropharynx is clear. No oropharyngeal exudate or posterior oropharyngeal erythema.  Neck:     Vascular: No carotid bruit.  Cardiovascular:     Rate and Rhythm: Normal rate and regular rhythm.     Heart sounds: Normal heart sounds. No murmur heard.    No friction rub. No gallop.  Pulmonary:     Effort: Pulmonary effort is normal. No respiratory distress.     Breath sounds: No stridor. No wheezing, rhonchi or rales.  Musculoskeletal:     Cervical back: Neck  supple. No rigidity or tenderness.  Lymphadenopathy:     Cervical: No cervical adenopathy.  Skin:    General: Skin is dry.  Neurological:     General: No focal deficit present.     Mental Status: She is alert and oriented to person, place, and time. Mental status is at baseline.     Cranial Nerves: No cranial nerve deficit.           Assessment & Plan:  H/O thyroid  nodule - Plan: US  THYROID   Dyspnea on exertion - Plan: Ambulatory referral to Cardiology  Skin ulcer of sacrum, limited to breakdown of skin (HCC)  History of femur fracture Given the possible thyroid  nodule seen on the CAT scan I will order an ultrasound of the thyroid .  If the presence of a nodule is confirmed and it sufficiently large, she may need a fine-needle aspiration biopsy.  Patient had hypoxia and possible pulmonary edema on exam in the hospital.  She also reports worsening dyspnea on exertion and occasional angina.  Recommended follow-up with cardiology for possible stress test to determine if her nonflow-limiting lesion from several years ago has progressed further.  Recommended zinc oxide barrier cream to the stage II pressure ulcer on her sacrum.  Recommended frequent position changes.  Patient's receiving physical therapy for her femur fracture.  There is no evidence of any complications.

## 2024-01-29 DIAGNOSIS — Z9181 History of falling: Secondary | ICD-10-CM | POA: Diagnosis not present

## 2024-01-29 DIAGNOSIS — M25561 Pain in right knee: Secondary | ICD-10-CM | POA: Diagnosis not present

## 2024-01-29 DIAGNOSIS — M25552 Pain in left hip: Secondary | ICD-10-CM | POA: Diagnosis not present

## 2024-01-29 DIAGNOSIS — R42 Dizziness and giddiness: Secondary | ICD-10-CM | POA: Diagnosis not present

## 2024-01-30 DIAGNOSIS — M1711 Unilateral primary osteoarthritis, right knee: Secondary | ICD-10-CM | POA: Diagnosis not present

## 2024-01-31 DIAGNOSIS — R42 Dizziness and giddiness: Secondary | ICD-10-CM | POA: Diagnosis not present

## 2024-01-31 DIAGNOSIS — M25561 Pain in right knee: Secondary | ICD-10-CM | POA: Diagnosis not present

## 2024-01-31 DIAGNOSIS — M25552 Pain in left hip: Secondary | ICD-10-CM | POA: Diagnosis not present

## 2024-01-31 DIAGNOSIS — Z9181 History of falling: Secondary | ICD-10-CM | POA: Diagnosis not present

## 2024-02-04 ENCOUNTER — Ambulatory Visit (HOSPITAL_COMMUNITY): Admission: RE | Admit: 2024-02-04 | Discharge: 2024-02-04 | Attending: Family Medicine | Admitting: Family Medicine

## 2024-02-04 DIAGNOSIS — E041 Nontoxic single thyroid nodule: Secondary | ICD-10-CM | POA: Diagnosis not present

## 2024-02-04 DIAGNOSIS — Z8639 Personal history of other endocrine, nutritional and metabolic disease: Secondary | ICD-10-CM | POA: Diagnosis not present

## 2024-02-04 DIAGNOSIS — M25552 Pain in left hip: Secondary | ICD-10-CM | POA: Diagnosis not present

## 2024-02-04 DIAGNOSIS — M25561 Pain in right knee: Secondary | ICD-10-CM | POA: Diagnosis not present

## 2024-02-04 DIAGNOSIS — R42 Dizziness and giddiness: Secondary | ICD-10-CM | POA: Diagnosis not present

## 2024-02-04 DIAGNOSIS — Z9181 History of falling: Secondary | ICD-10-CM | POA: Diagnosis not present

## 2024-02-06 ENCOUNTER — Ambulatory Visit: Payer: Self-pay | Admitting: Family Medicine

## 2024-02-06 DIAGNOSIS — Z9181 History of falling: Secondary | ICD-10-CM | POA: Diagnosis not present

## 2024-02-06 DIAGNOSIS — R42 Dizziness and giddiness: Secondary | ICD-10-CM | POA: Diagnosis not present

## 2024-02-06 DIAGNOSIS — M25561 Pain in right knee: Secondary | ICD-10-CM | POA: Diagnosis not present

## 2024-02-06 DIAGNOSIS — M25552 Pain in left hip: Secondary | ICD-10-CM | POA: Diagnosis not present

## 2024-02-07 ENCOUNTER — Other Ambulatory Visit: Payer: Self-pay

## 2024-02-07 DIAGNOSIS — E041 Nontoxic single thyroid nodule: Secondary | ICD-10-CM

## 2024-02-07 NOTE — Progress Notes (Unsigned)
 Lauren House POUR, MD  [name] Procedure Requests Cc: [name], RT No need to submit this for approval when the read on the thyroid  US  by us  recommends biopsy.  Nodule 1 (TI-RADS 4), measuring 1.6 cm, located in the mid RIGHT thyroid  lobe, meets criteria for FNA.  If we say it meets criteria for biopsy and then they order the biopsy, just schedule it.  HKM       Previous Messages    ----- Message ----- From: [name] Sent: 02/07/2024  11:39 AM EST To: [name]; [name], RT; Ir Proc* Subject: US  FNA BIOPSY THYROID  [name]    Procedure :US  FNA BIOPSY THYROID  [name]  Reason :thyroid  nodule Dx: Thyroid  nodule [E04.1 (ICD-10-CM)]    History :US  THYROID ,CT Angio Chest Aorta W and/or Wo Contrast,CT Hip Left Wo Contrast ,CT Head Wo Contrast  Provider: Duanne Butler DASEN, MD  Provider contact ;  [phone]

## 2024-02-10 DIAGNOSIS — M25552 Pain in left hip: Secondary | ICD-10-CM | POA: Diagnosis not present

## 2024-02-10 DIAGNOSIS — R42 Dizziness and giddiness: Secondary | ICD-10-CM | POA: Diagnosis not present

## 2024-02-10 DIAGNOSIS — Z9181 History of falling: Secondary | ICD-10-CM | POA: Diagnosis not present

## 2024-02-10 DIAGNOSIS — M25561 Pain in right knee: Secondary | ICD-10-CM | POA: Diagnosis not present

## 2024-02-13 ENCOUNTER — Encounter: Payer: Self-pay | Admitting: Student

## 2024-02-13 ENCOUNTER — Ambulatory Visit: Attending: Student | Admitting: Student

## 2024-02-13 VITALS — BP 126/74 | HR 66 | Ht 66.5 in | Wt 136.2 lb

## 2024-02-13 DIAGNOSIS — I10 Essential (primary) hypertension: Secondary | ICD-10-CM

## 2024-02-13 DIAGNOSIS — R0609 Other forms of dyspnea: Secondary | ICD-10-CM | POA: Diagnosis not present

## 2024-02-13 DIAGNOSIS — I422 Other hypertrophic cardiomyopathy: Secondary | ICD-10-CM

## 2024-02-13 DIAGNOSIS — I2510 Atherosclerotic heart disease of native coronary artery without angina pectoris: Secondary | ICD-10-CM

## 2024-02-13 DIAGNOSIS — E785 Hyperlipidemia, unspecified: Secondary | ICD-10-CM

## 2024-02-13 MED ORDER — METOPROLOL SUCCINATE ER 25 MG PO TB24: 25.0000 mg | ORAL_TABLET | Freq: Every day | ORAL | 3 refills | Status: AC

## 2024-02-13 MED ORDER — NITROGLYCERIN 0.4 MG SL SUBL: 0.4000 mg | SUBLINGUAL_TABLET | SUBLINGUAL | 1 refills | Status: AC | PRN

## 2024-02-13 NOTE — Progress Notes (Unsigned)
 Cardiology Office Note    Date:  02/13/2024  ID:  Lauren House, DOB 14-Mar-1935, MRN [id number] Cardiologist: Vinie JAYSON Maxcy, MD { :  History of Present Illness:    Lauren House is a 88 y.o. female with past medical history of apical variant hypertrophic cardiomyopathy, CAD (prior DES to LAD in 06/2015), venous insufficiency, borderline DM, HTN and suspected familial HLD with medication intolerances who presents to the office today for evaluation of worsening shortness of breath and hospital follow-up.  She was last examined by [name], PA in 10/2023 and reported having chronic dyspnea on exertion which had overall been unchanged.  She denied any chest pain or palpitations.  She reported only taking Lopressor  once a day due to twice daily dosing upsetting her stomach.  She was transition to Toprol -XL 25 mg daily.  She had been intolerant to multiple statins and was not interested in additional options at that time.  Lauren House did reach out to Dr. Maxcy in regards to genetic testing for apical HOCM and this was reviewed with Dr. Santo and genetic testing was recommended if she were open to this.  The patient declined testing at that time and plan to review with her daughter and son and have them talk to their providers.  In the interim, she was admitted to [facility] in 12/2023 for an acute close left intertrochanteric femur fracture.  She underwent intramedullary nailing on 01/06/2024.  She did have acute hypoxic respiratory failure during admission and proBNP was mildly elevated at 1064 and she responded well with IV Lasix.  CTA during admission showed no evidence of a PE but was noted to have scattered, ground glass attenuation in the lungs bilaterally which could represent edema or pneumonitis.  Echocardiogram during admission showed a preserved EF of 55 to 60% with no regional wall motion abnormalities.  She did have severe asymmetric LVH of the septal segment, grade 1 diastolic  dysfunction, normal RV function and mild MR.  She was discharged to the [facility] for [facility].  She followed up with her PCP on 01/27/2024 and reported still having dyspnea on exertion. Given concerns this could represent angina, follow-up with Cardiology was recommended.  In talking with the patient and her daughter today, she reports having baseline dyspnea on exertion for over the past year. Does not feel like symptoms have acutely changed.  She says that her breathing has overall improved over the past several weeks but she has not been as active since having her surgery. She is now back at home and her daughter checks on her frequently. She denies any recent chest pain or palpitations.  No specific orthopnea or PND. Has noticed some intermittent swelling along her legs and typically most notable along her left leg at times since her surgery. She does not typically add salt to foods but her daughter mentions that she consumes salty snacks.  Studies Reviewed:   EKG: EKG is not ordered today. NSR, HR 70 with LVH.   Cardiac Catheterization: 06/2015 Mid LAD lesion, 80% stenosed. Post intervention with Promus DES 2.25 mm x 16 mm (2.4 mm), there is a 0% residual stenosis. Ost LAD lesion, 50% stenosed. stable from original catheterization The left ventricular systolic function is normal. with minimal apical filling consistent with known apical hypertrophy. Otherwise mild to moderate disease in the circumflex and RCA system     Likely culprit for the patient's symptom was the mid LAD lesion. This is treated with a DES stent.  Plan: Overnight monitoring post PCI. Check P2Y12 inhibition in the morning Continue dual therapy for minimum of 3 months, at which time can stop aspirin . Expected discharge tomorrow    Echocardiogram: 12/2023 IMPRESSIONS     1. Left ventricular ejection fraction, by estimation, is 55 to 60%. The  left ventricle has normal function. The left ventricle has no  regional  wall motion abnormalities. There is severe asymmetric left ventricular  hypertrophy of the septal segment. Left   ventricular diastolic parameters are consistent with Grade I diastolic  dysfunction (impaired relaxation). Elevated left ventricular end-diastolic  pressure.   2. Right ventricular systolic function is normal. The right ventricular  size is normal. Moderately increased right ventricular wall thickness.  Tricuspid regurgitation signal is inadequate for assessing PA pressure.   3. The mitral valve is abnormal. Mild mitral valve regurgitation. No  evidence of mitral stenosis. The mean mitral valve gradient is 3.0 mmHg.  Severe mitral annular calcification.   4. The aortic valve was not well visualized. Aortic valve regurgitation  is not visualized. No aortic stenosis is present.   5. The inferior vena cava is normal in size with greater than 50%  respiratory variability, suggesting right atrial pressure of 3 mmHg.   Comparison(s): No significant change from prior study.   Risk Assessment/Calculations:   {Does this patient have ATRIAL FIBRILLATION?[phone]}           Physical Exam:   VS:  BP 126/74   Pulse 66   Ht 5' 6.5 (1.689 m)   Wt 136 lb 3.2 oz (61.8 kg)   SpO2 100%   BMI 21.65 kg/m    Wt Readings from Last 3 Encounters:  02/13/24 136 lb 3.2 oz (61.8 kg)  01/27/24 131 lb (59.4 kg)  01/20/24 141 lb 8 oz (64.2 kg)     GEN: Well nourished, well developed in no acute distress NECK: No JVD; No carotid bruits CARDIAC: ***RRR, no murmurs, rubs, gallops RESPIRATORY:  Clear to auscultation without rales, wheezing or rhonchi  ABDOMEN: Appears non-distended. No obvious abdominal masses. EXTREMITIES: No clubbing or cyanosis. No edema.  Distal pedal pulses are 2+ bilaterally.   Assessment and Plan:      {Are you ordering a CV Procedure (e.g. stress test, cath, DCCV, TEE, etc)?   Press F2        :[id number]}   Signed, Laymon CHRISTELLA Qua, PA-C

## 2024-02-13 NOTE — Patient Instructions (Signed)
 Medication Instructions:  Your physician recommends that you continue on your current medications as directed. Please refer to the Current Medication list given to you today.  *If you need a refill on your cardiac medications before your next appointment, please call your pharmacy*  Lab Work: NONE   If you have labs (blood work) drawn today and your tests are completely normal, you will receive your results only by: MyChart Message (if you have MyChart) OR A paper copy in the mail If you have any lab test that is abnormal or we need to change your treatment, we will call you to review the results.  Testing/Procedures: NONE   Follow-Up: At [facility], you and your health needs are our priority.  As part of our continuing mission to provide you with exceptional heart care, our providers are all part of one team.  This team includes your primary Cardiologist (physician) and Advanced Practice Providers or APPs (Physician Assistants and Nurse Practitioners) who all work together to provide you with the care you need, when you need it.  Your next appointment:   3 -4  month(s)  Provider:   Woodfin Hays, PA-C    We recommend signing up for the patient portal called MyChart.  Sign up information is provided on this After Visit Summary.  MyChart is used to connect with patients for Virtual Visits (Telemedicine).  Patients are able to view lab/test results, encounter notes, upcoming appointments, etc.  Non-urgent messages can be sent to your provider as well.   To learn more about what you can do with MyChart, go to [email or web address].   Other Instructions Thank you for choosing [facility]!

## 2024-02-14 ENCOUNTER — Encounter: Payer: Self-pay | Admitting: Student

## 2024-02-18 ENCOUNTER — Encounter: Payer: Self-pay | Admitting: Orthopedic Surgery

## 2024-02-18 ENCOUNTER — Other Ambulatory Visit (INDEPENDENT_AMBULATORY_CARE_PROVIDER_SITE_OTHER): Payer: Self-pay

## 2024-02-18 ENCOUNTER — Ambulatory Visit: Admitting: Orthopedic Surgery

## 2024-02-18 DIAGNOSIS — S72002D Fracture of unspecified part of neck of left femur, subsequent encounter for closed fracture with routine healing: Secondary | ICD-10-CM

## 2024-02-18 DIAGNOSIS — S72142D Displaced intertrochanteric fracture of left femur, subsequent encounter for closed fracture with routine healing: Secondary | ICD-10-CM

## 2024-02-18 NOTE — Progress Notes (Signed)
 Orthopaedic Postop Note  Assessment: Lauren House is a 88 y.o. female s/p cephalomedullary nail for Left intertrochanteric femur fracture  DOS: 01/06/24  Plan: Sutures previously trimmed, incision healing well Radiographs stable He is ambulating very well with a cane Continue with physical therapy. Continue ambulating to help strengthen the leg, and facilitate healing She is doing very well at this point.  I am comfortable if she returns to clinic as needed.   Follow-up: Return if symptoms worsen or fail to improve. XR at next visit: AP pelvis and Left femur  Subjective:  Chief Complaint  Patient presents with   Routine Post Op    L Femur ORIF DOS 01/06/24    History of Present Illness: Lauren House is a 88 y.o. female who presents following the above stated procedure.  She was initially discharged to a nursing facility.  She stayed there for few days.  She has since returned home.  There are no issues with her incisions, no concerns overall.  As a result, this is her first postoperative visit, 6 weeks after surgery.  She does not take medicines on a regular basis.  She is ambulating well with a cane.  She notes swelling in the left foot, which is concerning to her.  Review of Systems: No fevers or chills No numbness or tingling No Chest Pain No shortness of breath   Objective: There were no vitals taken for this visit.  Physical Exam:  Alert and oriented.  No acute distress.    Steady gait with a cane  Surgical incisions are healing well.  No surrounding erythema or drainage.  Able to maintain a straight leg raise.  Active motion intact in the TA/EHL.  Toes are warm and well-perfused.  Mild diffuse swelling of the lower extremity   IMAGING: I personally ordered and reviewed the following images:  XR of the Left femur and AP pelvis demonstrates a well positioned cephalomedullary nail.   The intertrochanteric femur fracture remains in stable position.  There is  no evidence of implant subsidence.  No acute fractures are noted.  Screws are not backing out.  No concern for screw cutout.   Impression: Left intertrochanteric femur fracture in stable position without evidence of hardware failure or subsidence.   Oneil DELENA Horde, MD 02/18/2024 11:31 AM

## 2024-03-04 ENCOUNTER — Ambulatory Visit (HOSPITAL_COMMUNITY)
Admission: RE | Admit: 2024-03-04 | Discharge: 2024-03-04 | Disposition: A | Discharge status: Home / Self Care | Source: Ambulatory Visit | Attending: Family Medicine | Admitting: Family Medicine

## 2024-03-04 DIAGNOSIS — E041 Nontoxic single thyroid nodule: Secondary | ICD-10-CM | POA: Diagnosis present

## 2024-03-04 MED ORDER — LIDOCAINE HCL (PF) 1 % IJ SOLN
3.0000 mL | Freq: Once | INTRAMUSCULAR | Status: AC
  Administered 2024-03-04: 3 mL via INTRADERMAL

## 2024-03-05 LAB — CYTOLOGY - NON PAP

## 2024-03-06 ENCOUNTER — Ambulatory Visit: Payer: Self-pay | Admitting: Family Medicine

## 2024-03-17 ENCOUNTER — Other Ambulatory Visit: Payer: Self-pay

## 2024-03-17 ENCOUNTER — Ambulatory Visit (INDEPENDENT_AMBULATORY_CARE_PROVIDER_SITE_OTHER): Admitting: Orthopedic Surgery

## 2024-03-17 DIAGNOSIS — S72142D Displaced intertrochanteric fracture of left femur, subsequent encounter for closed fracture with routine healing: Secondary | ICD-10-CM

## 2024-03-18 ENCOUNTER — Encounter: Payer: Self-pay | Admitting: Orthopedic Surgery

## 2024-03-18 NOTE — Progress Notes (Signed)
 Orthopaedic Postop Note  Assessment: Lauren House is a 89 y.o. female s/p cephalomedullary nail for Left intertrochanteric femur fracture  DOS: 01/06/24  Plan: Really doing okay.  She is using a cane.  Unfortunately, she recently fell, but thankfully did not sustain another injury.  Radiographs remain stable.  Provided reassurance.  Some of the swelling and other symptoms she is experiencing are expected at this point.  She will keep us  updated, and return to clinic as needed.   Follow-up: Return if symptoms worsen or fail to improve. XR at next visit: AP pelvis and Left femur  Subjective:  Chief Complaint  Patient presents with   Leg Pain    L femur Fx, has been having pain and numbness going to the foot.  DOS: 01/06/24    History of Present Illness: Lauren House is a 89 y.o. female who presents following the above stated procedure.  I saw her couple weeks ago, and she was very well.  Since then, she lost her balance in the top, and fell.  It took her a long time to get up, she lives at home alone.  Since then, she has had worsening pain over the lateral hip, with occasional numbness of the foot.  She also has persistent swelling in the left lower extremity.  She has been able to ambulate.  She feels well otherwise.   Review of Systems: No fevers or chills No numbness or tingling No Chest Pain No shortness of breath   Objective: There were no vitals taken for this visit.  Physical Exam:  Alert and oriented.  No acute distress.    Steady gait with a cane  Surgical incisions have healed.  No surrounding erythema or drainage.  Tenderness over the lateral hip.  She tolerates range of motion of the left hip.  She is able to maintain a straight leg raise.  Sensation is intact to the dorsum and plantar aspect of the left foot.  Active motion intact in EHL/TA.   IMAGING: I personally ordered and reviewed the following images:  AP pelvis and left femur x-rays were  obtained in clinic today.  These are compared to prior x-rays.  No acute fractures noted.  Positioning of the cephalomedullary nail remains unchanged.  There is no screw cut out.  No lucency around the hardware.  Screws not backing out.  Impression: Stable left intertrochanteric femur fracture without hardware failure or subsidence.   Oneil DELENA Horde, MD 03/18/2024 1:03 PM

## 2024-04-01 ENCOUNTER — Ambulatory Visit: Admitting: Student

## 2024-06-03 ENCOUNTER — Ambulatory Visit: Admitting: Student

## 2024-06-11 ENCOUNTER — Encounter
